# Patient Record
Sex: Female | Born: 1965 | Race: Black or African American | Hispanic: No | Marital: Married | Smoking: Never smoker
Health system: Southern US, Community
[De-identification: ages and names within clinical notes are randomized; demographics above are authoritative.]

## PROBLEM LIST (undated history)

## (undated) DIAGNOSIS — N62 Hypertrophy of breast: Secondary | ICD-10-CM

## (undated) DIAGNOSIS — I420 Dilated cardiomyopathy: Secondary | ICD-10-CM

## (undated) DIAGNOSIS — R002 Palpitations: Secondary | ICD-10-CM

## (undated) DIAGNOSIS — R05 Cough: Principal | ICD-10-CM

## (undated) DIAGNOSIS — E611 Iron deficiency: Secondary | ICD-10-CM

## (undated) DIAGNOSIS — E049 Nontoxic goiter, unspecified: Secondary | ICD-10-CM

## (undated) DIAGNOSIS — I10 Essential (primary) hypertension: Secondary | ICD-10-CM

## (undated) DIAGNOSIS — D3500 Benign neoplasm of unspecified adrenal gland: Secondary | ICD-10-CM

## (undated) DIAGNOSIS — I2699 Other pulmonary embolism without acute cor pulmonale: Secondary | ICD-10-CM

## (undated) HISTORY — DX: Dilated cardiomyopathy: I42.0

## (undated) HISTORY — DX: Hypertrophy of breast: N62

## (undated) HISTORY — DX: Morbid (severe) obesity due to excess calories: E66.01

## (undated) HISTORY — DX: Essential (primary) hypertension: I10

## (undated) HISTORY — PX: LAPAROTOMY: SHX154

## (undated) HISTORY — DX: Benign neoplasm of unspecified adrenal gland: D35.00

## (undated) HISTORY — DX: Cough: R05

## (undated) HISTORY — DX: Palpitations: R00.2

## (undated) HISTORY — DX: Iron deficiency: E61.1

## (undated) HISTORY — DX: Nontoxic goiter, unspecified: E04.9

## (undated) HISTORY — DX: Other pulmonary embolism without acute cor pulmonale: I26.99

---

## 2006-05-30 ENCOUNTER — Emergency Department: Payer: Self-pay | Admitting: Emergency Medicine

## 2006-10-31 ENCOUNTER — Emergency Department: Payer: Self-pay | Admitting: Emergency Medicine

## 2012-05-06 ENCOUNTER — Emergency Department: Payer: Self-pay | Admitting: Emergency Medicine

## 2013-03-06 DIAGNOSIS — Z8639 Personal history of other endocrine, nutritional and metabolic disease: Secondary | ICD-10-CM | POA: Insufficient documentation

## 2013-05-22 DIAGNOSIS — E0520 Thyrotoxicosis with toxic multinodular goiter without thyrotoxic crisis or storm: Secondary | ICD-10-CM | POA: Insufficient documentation

## 2014-02-02 ENCOUNTER — Ambulatory Visit: Payer: Self-pay | Admitting: Family Medicine

## 2014-06-03 ENCOUNTER — Encounter: Payer: Self-pay | Admitting: *Deleted

## 2014-06-04 ENCOUNTER — Ambulatory Visit (INDEPENDENT_AMBULATORY_CARE_PROVIDER_SITE_OTHER): Payer: BLUE CROSS/BLUE SHIELD | Admitting: Cardiovascular Disease

## 2014-06-04 ENCOUNTER — Encounter (INDEPENDENT_AMBULATORY_CARE_PROVIDER_SITE_OTHER): Payer: Self-pay

## 2014-06-04 ENCOUNTER — Encounter: Payer: Self-pay | Admitting: Cardiovascular Disease

## 2014-06-04 VITALS — BP 157/94 | HR 67 | Ht 68.0 in | Wt 352.5 lb

## 2014-06-04 DIAGNOSIS — R0602 Shortness of breath: Secondary | ICD-10-CM | POA: Diagnosis not present

## 2014-06-04 DIAGNOSIS — R002 Palpitations: Secondary | ICD-10-CM | POA: Diagnosis not present

## 2014-06-04 DIAGNOSIS — I10 Essential (primary) hypertension: Secondary | ICD-10-CM

## 2014-06-04 NOTE — Assessment & Plan Note (Signed)
I suspect that her dyspnea is multifactorial due to physical deconditioning, obesity and underlying anemia. She reports improvement in symptoms since he was started on iron therapy. Nonetheless, she had previous cardiomyopathy in the setting of hyperthyroidism with most recent ejection fraction of 55% in 2015. I requested a repeat echocardiogram for evaluation. Continue treatment with carvedilol.

## 2014-06-04 NOTE — Assessment & Plan Note (Signed)
She did not take carvedilol this morning. If blood pressure remains elevated, consider adding an ACE inhibitor or an ARB.

## 2014-06-04 NOTE — Progress Notes (Signed)
Primary care physician: Dr. Ancil Boozer  HPI  This is a pleasant 49 year old African-American female who was referred for evaluation of dyspnea. She was diagnosed with hyperthyroidism in 2010 and received radioactive iodine treatment. She reports that she had elevated blood pressure and heart rate at that time with possibly reduced ejection fraction. She was placed on carvedilol for that reason. She did undergo an echocardiogram in March 2015 at [facility] which showed an ejection fraction of 55% to 60% with normal right ventricle, dilated left ventricle and left atrium. The patient's other medical history include morbid obesity and anemia. She has no history of diabetes or hyperlipidemia. She is not a smoker. There is no family history of premature coronary artery disease. She has worsening exertional dyspnea recently without any chest pain. No orthopnea, PND or lower extremity edema. She was found to be severely anemic and was started on iron treatment. She reports improvement in symptoms since that time.  Allergies  Allergen Reactions  . Aspirin      Current Outpatient Prescriptions on File Prior to Visit  Medication Sig Dispense Refill  . carvedilol (COREG) 6.25 MG tablet Take 6.25 mg by mouth 2 (two) times daily with a meal.    . ferrous sulfate 325 (65 FE) MG tablet Take 325 mg by mouth 2 (two) times daily with a meal.      No current facility-administered medications on file prior to visit.     Past Medical History  Diagnosis Date  . Postpartum hemorrhage   . Iron deficiency   . Hypertension   . Morbid obesity   . Large breasts   . Dilated cardiomyopathy   . H/O: hypothyroidism   . Thyroid disease   . Heart palpitations      History reviewed. No pertinent past surgical history.   Family History  Problem Relation Age of Onset  . Family history unknown: Yes     History   Social History  . Marital Status: Single    Spouse Name: N/A  . Number of Children: N/A  . Years of  Education: N/A   Occupational History  . Not on file.   Social History Main Topics  . Smoking status: Never Smoker   . Smokeless tobacco: Not on file  . Alcohol Use: Yes     Comment: occasional  . Drug Use: No  . Sexual Activity: Not on file   Other Topics Concern  . Not on file   Social History Narrative     ROS A 10 point review of system was performed. It is negative other than that mentioned in the history of present illness.   PHYSICAL EXAM   BP 157/94 mmHg  Pulse 67  Ht 5\' 8"  (1.727 m)  Wt 352 lb 8 oz (159.893 kg)  BMI 53.61 kg/m2 Constitutional: She is oriented to person, place, and time. She appears well-developed and well-nourished. No distress.  HENT: No nasal discharge.  Head: Normocephalic and atraumatic.  Eyes: Pupils are equal and round. No discharge.  Neck: Normal range of motion. Neck supple. No JVD present. No thyromegaly present.  Cardiovascular: Normal rate, regular rhythm, normal heart sounds. Exam reveals no gallop and no friction rub. No murmur heard.  Pulmonary/Chest: Effort normal and breath sounds normal. No stridor. No respiratory distress. She has no wheezes. She has no rales. She exhibits no tenderness.  Abdominal: Soft. Bowel sounds are normal. She exhibits no distension. There is no tenderness. There is no rebound and no guarding.  Musculoskeletal: Normal range  of motion. She exhibits no edema and no tenderness.  Neurological: She is alert and oriented to person, place, and time. Coordination normal.  Skin: Skin is warm and dry. No rash noted. She is not diaphoretic. No erythema. No pallor.  Psychiatric: She has a normal mood and affect. Her behavior is normal. Judgment and thought content normal.     EKG: Sinus  Rhythm  Low voltage in precordial leads.   -Poor R-wave progression -may be secondary to pulmonary disease   consider old anterior infarct.   -  Nonspecific T-abnormality.   ABNORMAL    ASSESSMENT AND PLAN

## 2014-06-04 NOTE — Patient Instructions (Signed)
Medication Instructions:  Your physician recommends that you continue on your current medications as directed. Please refer to the Current Medication list given to you today.   Labwork: none  Testing/Procedures: Your physician has requested that you have an echocardiogram. Echocardiography is a painless test that uses sound waves to create images of your heart. It provides your doctor with information about the size and shape of your heart and how well your heart's chambers and valves are working. This procedure takes approximately one hour. There are no restrictions for this procedure.    Follow-Up: Your physician recommends that you schedule a follow-up appointment with Dr. Fletcher Anon as needed.   Any Other Special Instructions Will Be Listed Below (If Applicable).

## 2014-06-18 ENCOUNTER — Ambulatory Visit (INDEPENDENT_AMBULATORY_CARE_PROVIDER_SITE_OTHER): Payer: BLUE CROSS/BLUE SHIELD

## 2014-06-18 ENCOUNTER — Other Ambulatory Visit: Payer: Self-pay

## 2014-06-18 DIAGNOSIS — R0602 Shortness of breath: Secondary | ICD-10-CM

## 2014-06-22 ENCOUNTER — Encounter: Payer: Self-pay | Admitting: Family Medicine

## 2014-06-22 DIAGNOSIS — N62 Hypertrophy of breast: Secondary | ICD-10-CM | POA: Insufficient documentation

## 2014-06-22 DIAGNOSIS — I10 Essential (primary) hypertension: Secondary | ICD-10-CM | POA: Insufficient documentation

## 2014-06-22 DIAGNOSIS — Z8639 Personal history of other endocrine, nutritional and metabolic disease: Secondary | ICD-10-CM | POA: Insufficient documentation

## 2014-06-22 DIAGNOSIS — I517 Cardiomegaly: Secondary | ICD-10-CM | POA: Insufficient documentation

## 2014-06-22 DIAGNOSIS — D509 Iron deficiency anemia, unspecified: Secondary | ICD-10-CM | POA: Insufficient documentation

## 2014-06-24 ENCOUNTER — Ambulatory Visit: Payer: Self-pay | Admitting: Family Medicine

## 2014-09-02 ENCOUNTER — Encounter: Payer: Self-pay | Admitting: Family Medicine

## 2014-09-02 ENCOUNTER — Ambulatory Visit (INDEPENDENT_AMBULATORY_CARE_PROVIDER_SITE_OTHER): Payer: 59 | Admitting: Family Medicine

## 2014-09-02 VITALS — BP 132/84 | HR 79 | Temp 99.0°F | Resp 18 | Ht 68.0 in | Wt 348.8 lb

## 2014-09-02 DIAGNOSIS — N920 Excessive and frequent menstruation with regular cycle: Secondary | ICD-10-CM | POA: Diagnosis not present

## 2014-09-02 DIAGNOSIS — D509 Iron deficiency anemia, unspecified: Secondary | ICD-10-CM

## 2014-09-02 DIAGNOSIS — Z23 Encounter for immunization: Secondary | ICD-10-CM | POA: Diagnosis not present

## 2014-09-02 DIAGNOSIS — I10 Essential (primary) hypertension: Secondary | ICD-10-CM

## 2014-09-02 DIAGNOSIS — K219 Gastro-esophageal reflux disease without esophagitis: Secondary | ICD-10-CM

## 2014-09-02 DIAGNOSIS — Z8639 Personal history of other endocrine, nutritional and metabolic disease: Secondary | ICD-10-CM | POA: Diagnosis not present

## 2014-09-02 DIAGNOSIS — E668 Other obesity: Secondary | ICD-10-CM

## 2014-09-02 MED ORDER — FERROUS SULFATE 325 (65 FE) MG PO TABS: 325.0000 mg | ORAL_TABLET | Freq: Two times a day (BID) | ORAL | Status: DC

## 2014-09-02 MED ORDER — CARVEDILOL 6.25 MG PO TABS: 6.2500 mg | ORAL_TABLET | Freq: Two times a day (BID) | ORAL | Status: DC

## 2014-09-02 NOTE — Progress Notes (Signed)
Name: Madeline Dominguez   MRN: [id number]    DOB: [birth year]   Date:09/02/2014       Progress Note  Subjective  Chief Complaint  Chief Complaint  Patient presents with  . Medication Refill    follow-up  . Hypertension    HPI  HTN: taking Carvedilol, denies side effects and bp is at goal . No chest pain  SOB: seen by Dr. Fletcher Anon, Echo showed hypertrophic Left Ventricular wall with normal cavity. He thought it was multifactorial. She states she is feeling better with iron supplementation. Able to walk a half a mile without getting SOB.  Iron Deficiency anemia: she is taking ferrous sulfate but did not have any screening for colon cancer. She has a history of heavy cycles that was worse before thyroid ablation done earlier this year.   Obesity Morbid: she has lost some weight, has decreased soda intake to a maximum of 2 cans daily, and is walking more since SOB is not as severe.   GERD: she states 3 months ago she moved and started to eat out a lot more often, she describes as an indigestion feeling on her throat and regurgitation taking otc medication to control symptoms.    Patient Active Problem List   Diagnosis Date Noted  . Extreme obesity 06/22/2014  . Large breasts 06/22/2014  . Anemia, iron deficiency 06/22/2014  . Benign hypertension 06/22/2014  . H/O hyperthyroidism 06/22/2014  . LVH (left ventricular hypertrophy) 06/22/2014  . SOB (shortness of breath) 06/04/2014  . 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    History reviewed. No pertinent past surgical history.  Family History  Problem Relation Age of Onset  . Family history unknown: Yes    Social History   Social History  . Marital Status: Single    Spouse Name: N/A  . Number of Children: N/A  . Years of Education: N/A   Occupational History  . Not on file.   Social History Main Topics  . Smoking status: Never Smoker   . Smokeless tobacco: Never Used  . Alcohol Use: No     Comment: occasional   . Drug Use: No  . Sexual Activity: Not Currently   Other Topics Concern  . Not on file   Social History Narrative     Current outpatient prescriptions:  .  carvedilol (COREG) 6.25 MG tablet, Take 1 tablet (6.25 mg total) by mouth 2 (two) times daily with a meal., Disp: 180 tablet, Rfl: 1 .  ferrous sulfate 325 (65 FE) MG tablet, Take 1 tablet (325 mg total) by mouth 2 (two) times daily with a meal., Disp: 180 tablet, Rfl: 1  Allergies  Allergen Reactions  . Aspirin      ROS  Constitutional: Negative for fever but has some  weight change.  Respiratory: Negative for cough positive for mild  shortness of breath.   Cardiovascular: Negative for chest pain or palpitations.  Gastrointestinal: Negative for abdominal pain, no bowel changes.  Musculoskeletal: Negative for gait problem or joint swelling.  Skin: Negative for rash.  Neurological: Negative for dizziness or headache.  No other specific complaints in a complete review of systems (except as listed in HPI above). Objective  Filed Vitals:   09/02/14 1040  BP: 132/84  Pulse: 79  Temp: 99 F (37.2 C)  TempSrc: Oral  Resp: 18  Height: 5\' 8"  (1.727 m)  Weight: 348 lb 12.8 oz (158.215 kg)  SpO2: 97%    Body mass index is 53.05 kg/(m^2).  Physical Exam  Constitutional: Patient appears well-developed and obese  No distress.  HEENT: head atraumatic, normocephalic, pupils equal and reactive to light, neck supple, throat within normal limits Cardiovascular: Normal rate, regular rhythm and normal heart sounds.  No murmur heard. Trace BLE edema.  Pulmonary/Chest: Effort normal and breath sounds normal. No respiratory distress. Abdominal: Soft.  There is no tenderness. Psychiatric: Patient has a normal mood and affect. behavior is normal. Judgment and thought content normal.   PHQ2/9: Depression screen PHQ 2/9 09/02/2014  Decreased Interest 0  Down, Depressed, Hopeless 0  PHQ - 2 Score 0     Fall Risk: Fall Risk   09/02/2014  Falls in the past year? No      Assessment & Plan   1. Benign hypertension At goal  - carvedilol (COREG) 6.25 MG tablet; Take 1 tablet (6.25 mg total) by mouth 2 (two) times daily with a meal.  Dispense: 180 tablet; Refill: 1  2. Needs flu shot  - Flu Vaccine QUAD 36+ mos PF IM (Fluarix & Fluzone Quad PF) - refused - will get it at work   3. Extreme obesity Discussed with the patient the risk posed by an increased BMI. Discussed importance of portion control, calorie counting and at least 150 minutes of physical activity weekly. Avoid sweet beverages and drink more water. Eat at least 6 servings of fruit and vegetables daily   4. Anemia, iron deficiency She has heavy cycles and lasts 6 days, but because of her age we will send her home with hemoccult cards and if positive refer to GI, discussed contraception to decrease cycle but she will think about it - CBC with Differential/Platelet - Ferritin - ferrous sulfate 325 (65 FE) MG tablet; Take 1 tablet (325 mg total) by mouth 2 (two) times daily with a meal.  Dispense: 180 tablet; Refill: 1 - Hemoccult Cards (X3 cards); Standing - Hemoccult Cards (X3 cards)  5. H/O hyperthyroidism  - TSH  6. Need for Tdap vaccination  - Tdap vaccine greater than or equal to 7yo IM  7. Menorrhagia with regular cycle Discussed IUD, Nuvaring and ocp's she will think about it  8. Gastroesophageal reflux disease without esophagitis Symptoms started when she moved and started to eat out about 3 months ago, we will try life style modification first

## 2014-09-02 NOTE — Patient Instructions (Signed)

## 2014-09-28 ENCOUNTER — Telehealth: Payer: Self-pay | Admitting: Family Medicine

## 2014-09-28 ENCOUNTER — Other Ambulatory Visit: Payer: Self-pay

## 2014-09-28 DIAGNOSIS — I10 Essential (primary) hypertension: Secondary | ICD-10-CM

## 2014-09-28 MED ORDER — CARVEDILOL 6.25 MG PO TABS: 6.2500 mg | ORAL_TABLET | Freq: Two times a day (BID) | ORAL | Status: DC

## 2014-09-28 NOTE — Telephone Encounter (Signed)
pty said that dr told her to call when she needed a  NEW RX for her blood pressure meds/ Beta Blocker meds. Pharm is [facility] on [address]

## 2014-10-25 ENCOUNTER — Telehealth: Payer: Self-pay | Admitting: Family Medicine

## 2014-10-25 NOTE — Telephone Encounter (Signed)
Called pharmacy and they were filling her Carvedilol at the present moment, so I called the patient and informed her that the pharmacy would have her prescription ready shortly.

## 2014-10-25 NOTE — Telephone Encounter (Signed)
Pt still waiting on Beta Blocker from September to be sent to [facility].

## 2014-12-03 ENCOUNTER — Ambulatory Visit (INDEPENDENT_AMBULATORY_CARE_PROVIDER_SITE_OTHER): Payer: 59 | Admitting: Family Medicine

## 2014-12-03 ENCOUNTER — Encounter: Payer: Self-pay | Admitting: Family Medicine

## 2014-12-03 VITALS — BP 136/84 | HR 85 | Temp 99.1°F | Resp 16 | Ht 68.0 in | Wt 345.8 lb

## 2014-12-03 DIAGNOSIS — Z13220 Encounter for screening for lipoid disorders: Secondary | ICD-10-CM

## 2014-12-03 DIAGNOSIS — Z79899 Other long term (current) drug therapy: Secondary | ICD-10-CM

## 2014-12-03 DIAGNOSIS — R0683 Snoring: Secondary | ICD-10-CM | POA: Diagnosis not present

## 2014-12-03 DIAGNOSIS — N920 Excessive and frequent menstruation with regular cycle: Secondary | ICD-10-CM | POA: Diagnosis not present

## 2014-12-03 DIAGNOSIS — R42 Dizziness and giddiness: Secondary | ICD-10-CM | POA: Diagnosis not present

## 2014-12-03 DIAGNOSIS — R1310 Dysphagia, unspecified: Secondary | ICD-10-CM | POA: Diagnosis not present

## 2014-12-03 DIAGNOSIS — D509 Iron deficiency anemia, unspecified: Secondary | ICD-10-CM

## 2014-12-03 DIAGNOSIS — I10 Essential (primary) hypertension: Secondary | ICD-10-CM | POA: Diagnosis not present

## 2014-12-03 DIAGNOSIS — Z8639 Personal history of other endocrine, nutritional and metabolic disease: Secondary | ICD-10-CM | POA: Diagnosis not present

## 2014-12-03 DIAGNOSIS — E049 Nontoxic goiter, unspecified: Secondary | ICD-10-CM

## 2014-12-03 MED ORDER — NORGESTIMATE-ETH ESTRADIOL 0.25-35 MG-MCG PO TABS: 1.0000 | ORAL_TABLET | Freq: Every day | ORAL | Status: DC

## 2014-12-03 NOTE — Progress Notes (Signed)
Name: Madeline Dominguez   MRN: [id number]    DOB: June 27, 1965   Date:12/03/2014       Progress Note  Subjective  Chief Complaint  Chief Complaint  Patient presents with  . Medication Refill    FOLLOW-UP  . Hypertension    one episode of dizziness  . Dysphagia    onset 1 month food gets stuck in back of throat when eating    HPI  HTN: she is taking Carvedilol, no palpitation or chest pain. BP has been at goal.   Dizziness: she states she had one episode of dizziness earlier this week. She was at work and was asked to work another 4 hours. She went into her car to get a carvedilol pill to take, and it was an old pill. She took it, got back in the building and when she looked right the room seemed to spin to the left. It only lasted a brief second, no loss of consciousness, no chest pain, no SOB, no diaphoresis, no tinnitus or hearing loss associated. Symptoms only happened once.   Dysphagia: symptoms have been sporadic for months, however for the past month symptoms of food getting stuck on upper esophagus has been frequent- usually after dinner. It happens right after she eats or after taking her medication. It lasts at least 30 minutes seems to improve when she drinks water or massaging her throat. No blood in stools, no change in bowel movement  Iron deficiency anemia: she did not go get labs done, denies fatigue, cycles not as heavy now  Menorrhagia with regular cycle: she used to take ocp. Her husband has been [place] but will back in two months and she would like to go back on ocp's for pregnancy prevention and also for heavy cycles. She has taken ortho-cyclen in the past. No previous history of clots and she is not smoker.   Snoring: and wakes up with a mild headache in the morning, she is obese and has LVH , advised sleep study  Patient Active Problem List   Diagnosis Date Noted  . Heavy menstrual period 09/02/2014  . Gastroesophageal reflux disease without esophagitis 09/02/2014   . Extreme obesity ([place]) 06/22/2014  . Large breasts 06/22/2014  . Anemia, iron deficiency 06/22/2014  . Benign hypertension 06/22/2014  . H/O hyperthyroidism 06/22/2014  . LVH (left ventricular hypertrophy) 06/22/2014  . SOB (shortness of breath) 06/04/2014  . 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    History reviewed. No pertinent past surgical history.  Family History  Problem Relation Age of Onset  . Family history unknown: Yes    Social History   Social History  . Marital Status: Single    Spouse Name: N/A  . Number of Children: N/A  . Years of Education: N/A   Occupational History  . Not on file.   Social History Main Topics  . Smoking status: Never Smoker   . Smokeless tobacco: Never Used  . Alcohol Use: No     Comment: occasional  . Drug Use: No  . Sexual Activity: Not Currently   Other Topics Concern  . Not on file   Social History Narrative     Current outpatient prescriptions:  .  carvedilol (COREG) 6.25 MG tablet, Take 1 tablet (6.25 mg total) by mouth 2 (two) times daily with a meal., Disp: 180 tablet, Rfl: 0 .  ferrous sulfate 325 (65 FE) MG tablet, Take 1 tablet (325 mg total) by mouth 2 (two) times daily with a meal.,  Disp: 180 tablet, Rfl: 1 .  norgestimate-ethinyl estradiol (ORTHO-CYCLEN, 28,) 0.25-35 MG-MCG tablet, Take 1 tablet by mouth daily., Disp: 1 Package, Rfl: 11  Allergies  Allergen Reactions  . Aspirin      ROS  Constitutional: Negative for fever , positive for mild  weight change.  Respiratory: Negative for cough and shortness of breath.   Cardiovascular: Negative for chest pain or palpitations.  Gastrointestinal: Negative for abdominal pain, no bowel changes.  Musculoskeletal: Negative for gait problem or joint swelling.  Skin: Negative for rash.  Neurological: Negative for dizziness ( had one episodes ) , she has mild headache when she wakes up - discussed sleep study, but she wants to hold off for now.  No other  specific complaints in a complete review of systems (except as listed in HPI above).   Objective  Filed Vitals:   12/03/14 1110  BP: 136/84  Pulse: 85  Temp: 99.1 F (37.3 C)  TempSrc: Oral  Resp: 16  Height: 5\' 8"  (1.727 m)  Weight: 345 lb 12.8 oz (156.854 kg)  SpO2: 93%    Body mass index is 52.59 kg/(m^2).  Physical Exam  Constitutional: Patient appears well-developed and well-nourished. Obese  No distress.  HEENT: head atraumatic, normocephalic, pupils equal and reactive to light, neck supple, throat within normal limits, thyroid enlarged Cardiovascular: Normal rate, regular rhythm and normal heart sounds.  No murmur heard. No BLE edema. Pulmonary/Chest: Effort normal and breath sounds normal. No respiratory distress. Abdominal: Soft.  There is no tenderness. Psychiatric: Patient has a normal mood and affect. behavior is normal. Judgment and thought content normal.   PHQ2/9: Depression screen [facility] 2/9 12/03/2014 09/02/2014  Decreased Interest 0 0  Down, Depressed, Hopeless 0 0  PHQ - 2 Score 0 0    Fall Risk: Fall Risk  12/03/2014 09/02/2014  Falls in the past year? No No    Functional Status Survey: Is the patient deaf or have difficulty hearing?: No Does the patient have difficulty seeing, even when wearing glasses/contacts?: No Does the patient have difficulty concentrating, remembering, or making decisions?: No Does the patient have difficulty walking or climbing stairs?: No Does the patient have difficulty dressing or bathing?: No Does the patient have difficulty doing errands alone such as visiting a doctor's office or shopping?: No    Assessment & Plan  1. Dizziness  Call back if recurrence of symptoms, only one brief episode. Advised to stay hydrated also  2. Benign hypertension  At goal - Comprehensive metabolic panel  3. Iron deficiency anemia  Check labs, did not get it done on her last visit - POC Hemoccult Bld/Stl (3-Cd Home Screen);  Future  4. Lipid screening  - Lipid panel  5. Long-term use of high-risk medication  - Comprehensive metabolic panel  6. Menorrhagia with regular cycle  - norgestimate-ethinyl estradiol (ORTHO-CYCLEN, 28,) 0.25-35 MG-MCG tablet; Take 1 tablet by mouth daily.  Dispense: 1 Package; Refill: 11   7. Dysphagia  Discussed referral to GI or UGI and she chose the later - DG UGI  W/KUB; Future  8. Snoring  - Nocturnal polysomnography (NPSG); Future ESS of 5 today, but she is high risk with morbid obesity, LVH, HTN  9. Goiter  - US Soft Tissue Head/Neck; Future  10. History of toxic multinodular goiter  S/p ablation but seems large during exam today

## 2014-12-04 LAB — CBC WITH DIFFERENTIAL/PLATELET
BASOS ABS: 0 10*3/uL (ref 0.0–0.2)
BASOS: 1 %
EOS (ABSOLUTE): 0.2 10*3/uL (ref 0.0–0.4)
Eos: 3 %
Hematocrit: 33.6 % — ABNORMAL LOW (ref 34.0–46.6)
Hemoglobin: 10.1 g/dL — ABNORMAL LOW (ref 11.1–15.9)
IMMATURE GRANULOCYTES: 0 %
Immature Grans (Abs): 0 10*3/uL (ref 0.0–0.1)
Lymphocytes Absolute: 2.5 10*3/uL (ref 0.7–3.1)
Lymphs: 38 %
MCH: 26.9 pg (ref 26.6–33.0)
MCHC: 30.1 g/dL — ABNORMAL LOW (ref 31.5–35.7)
MCV: 89 fL (ref 79–97)
MONOS ABS: 0.5 10*3/uL (ref 0.1–0.9)
Monocytes: 7 %
NEUTROS PCT: 51 %
Neutrophils Absolute: 3.4 10*3/uL (ref 1.4–7.0)
PLATELETS: 303 10*3/uL (ref 150–379)
RBC: 3.76 x10E6/uL — ABNORMAL LOW (ref 3.77–5.28)
RDW: 15.8 % — AB (ref 12.3–15.4)
WBC: 6.6 10*3/uL (ref 3.4–10.8)

## 2014-12-04 LAB — LIPID PANEL
CHOLESTEROL TOTAL: 190 mg/dL (ref 100–199)
Chol/HDL Ratio: 2.6 ratio units (ref 0.0–4.4)
HDL: 74 mg/dL (ref >39)
LDL CALC: 105 mg/dL — AB (ref 0–99)
Triglycerides: 56 mg/dL (ref 0–149)
VLDL Cholesterol Cal: 11 mg/dL (ref 5–40)

## 2014-12-04 LAB — COMPREHENSIVE METABOLIC PANEL
ALT: 6 IU/L (ref 0–32)
AST: 8 IU/L (ref 0–40)
Albumin/Globulin Ratio: 1.1 (ref 1.1–2.5)
Albumin: 3.6 g/dL (ref 3.5–5.5)
Alkaline Phosphatase: 77 IU/L (ref 39–117)
BUN/Creatinine Ratio: 13 (ref 9–23)
BUN: 9 mg/dL (ref 6–24)
Bilirubin Total: 0.4 mg/dL (ref 0.0–1.2)
CALCIUM: 9 mg/dL (ref 8.7–10.2)
CO2: 26 mmol/L (ref 18–29)
CREATININE: 0.69 mg/dL (ref 0.57–1.00)
Chloride: 105 mmol/L (ref 97–106)
GFR calc Af Amer: 120 mL/min/{1.73_m2} (ref >59)
GFR, EST NON AFRICAN AMERICAN: 104 mL/min/{1.73_m2} (ref >59)
GLOBULIN, TOTAL: 3.3 g/dL (ref 1.5–4.5)
GLUCOSE: 95 mg/dL (ref 65–99)
Potassium: 4.6 mmol/L (ref 3.5–5.2)
SODIUM: 144 mmol/L (ref 136–144)
Total Protein: 6.9 g/dL (ref 6.0–8.5)

## 2014-12-04 LAB — TSH: TSH: 3.74 u[IU]/mL (ref 0.450–4.500)

## 2014-12-04 LAB — FERRITIN: Ferritin: 19 ng/mL (ref 15–150)

## 2014-12-06 ENCOUNTER — Encounter: Payer: Self-pay | Admitting: Family Medicine

## 2014-12-06 DIAGNOSIS — D509 Iron deficiency anemia, unspecified: Secondary | ICD-10-CM

## 2014-12-16 ENCOUNTER — Ambulatory Visit
Admission: RE | Admit: 2014-12-16 | Discharge: 2014-12-16 | Disposition: A | Discharge status: Home / Self Care | Payer: 59 | Source: Ambulatory Visit | Attending: Family Medicine | Admitting: Family Medicine

## 2014-12-16 DIAGNOSIS — E049 Nontoxic goiter, unspecified: Secondary | ICD-10-CM

## 2014-12-17 ENCOUNTER — Other Ambulatory Visit: Payer: Self-pay | Admitting: Family Medicine

## 2014-12-17 DIAGNOSIS — E041 Nontoxic single thyroid nodule: Secondary | ICD-10-CM | POA: Insufficient documentation

## 2015-01-05 ENCOUNTER — Telehealth: Payer: Self-pay | Admitting: Family Medicine

## 2015-01-05 NOTE — Telephone Encounter (Signed)
Patient was prescribed a new birthcontrol that she began last week. She now has diarrhea and her right ear is numb with tingle (almost like the hand when it falls asleep.) would like to know if she should stop taking the mediation. She did read where she could develop diarrhea in the beginning and that it will go away but she is concerned about her ear being numb (which is not all the time). She take the pill at night and when she wakes up that is when she notice the numbness the next morning. Please advise.

## 2015-01-06 ENCOUNTER — Telehealth: Payer: Self-pay

## 2015-01-06 NOTE — Telephone Encounter (Signed)
I don't think it is related, she can come in to be seen if symptoms persists.

## 2015-01-06 NOTE — Telephone Encounter (Signed)
Madeline Dominguez: Patient was prescribed a new birthcontrol that she began last week. She now has diarrhea and her right ear is numb with tingle (almost like the hand when it falls asleep.) would like to know if she should stop taking the mediation. She did read where she could develop diarrhea in the beginning and that it will go away but she is concerned about her ear being numb (which is not all the time). She take the pill at night and when she wakes up that is when she notice the numbness the next morning. Please advise.  Dr. Ancil Boozer: I don't think it is related, she can come in to be seen if symptoms persists.  I contacted this patient to relay Dr. Ancil Boozer message and she said ok, thanks.

## 2015-02-09 ENCOUNTER — Telehealth: Payer: Self-pay | Admitting: Family Medicine

## 2015-02-09 NOTE — Telephone Encounter (Signed)
Patient was prescribed a birthcontrol pill and had to stop taking it due to complications. It caused swollen and hard breast with fever and patient was vomiting every morning. Would like to know if you could change the brand.

## 2015-02-10 NOTE — Telephone Encounter (Signed)
Patient notified

## 2015-02-10 NOTE — Telephone Encounter (Signed)
The breast tenderness can happen with ocp's, but no fever. Ask her to try with food during the day and see if she can tolerate it

## 2015-02-14 ENCOUNTER — Telehealth: Payer: Self-pay

## 2015-02-14 MED ORDER — NORGESTIM-ETH ESTRAD TRIPHASIC 0.18/0.215/0.25 MG-25 MCG PO TABS: 1.0000 | ORAL_TABLET | Freq: Every day | ORAL | Status: DC

## 2015-02-14 NOTE — Telephone Encounter (Signed)
Patient states she was taking birth control at night with food but every morning she was waking up vomiting, and heat from bilateral breast. Patient would like to go back on ortho cyclen low due to side effects from this birth control.

## 2015-03-04 ENCOUNTER — Encounter: Payer: Self-pay | Admitting: Family Medicine

## 2015-03-04 ENCOUNTER — Ambulatory Visit (INDEPENDENT_AMBULATORY_CARE_PROVIDER_SITE_OTHER): Payer: 59 | Admitting: Family Medicine

## 2015-03-04 VITALS — BP 132/84 | HR 84 | Temp 98.2°F | Resp 18 | Wt 346.2 lb

## 2015-03-04 DIAGNOSIS — I517 Cardiomegaly: Secondary | ICD-10-CM | POA: Diagnosis not present

## 2015-03-04 DIAGNOSIS — I10 Essential (primary) hypertension: Secondary | ICD-10-CM | POA: Diagnosis not present

## 2015-03-04 DIAGNOSIS — N920 Excessive and frequent menstruation with regular cycle: Secondary | ICD-10-CM

## 2015-03-04 DIAGNOSIS — R0683 Snoring: Secondary | ICD-10-CM | POA: Diagnosis not present

## 2015-03-04 DIAGNOSIS — Z8639 Personal history of other endocrine, nutritional and metabolic disease: Secondary | ICD-10-CM | POA: Diagnosis not present

## 2015-03-04 DIAGNOSIS — D509 Iron deficiency anemia, unspecified: Secondary | ICD-10-CM | POA: Diagnosis not present

## 2015-03-04 MED ORDER — CARVEDILOL 6.25 MG PO TABS: 6.2500 mg | ORAL_TABLET | Freq: Two times a day (BID) | ORAL | Status: DC

## 2015-03-04 NOTE — Progress Notes (Signed)
Name: Madeline Dominguez   MRN: [id number]    DOB: Oct 28, 1965   Date:03/04/2015       Progress Note  Subjective  Chief Complaint  Chief Complaint  Patient presents with  . Dizziness  . Hypertension    patient is here for her 41-month f/u. patient check BP daily: runs around 130/83. no negative sx.  . Medication Refill  . Medication Management    birth control meds made her sick & breast were hot.    HPI   HTN: secondary to hyperthyroidism , she also has LVH but not CHF. Taking Coreg daily and bp is at goal. No side effects. No chest pain or palpitation  Menorrhagia with regular cycle: .Her husband has been [place] but will back in 2 weeks. She was given Ortho Cyclen back in December, but the medication caused breast tenderness and engorgement, fatigue, nausea. She asked to be switched back to low dose, however she did know that it was called in to her pharmacy. She has been without ocp since last cycle. LMP 02/18/2015 Advised to start ocp the first day of her next cycle, but use condoms for the first three months to avoid pregnancy  Snoring: wakes up feeling tired, sleep study was ordered but patient did not get an appointment. We will send another referral  GERD: doing much better now, very seldom needs to otc for heartburn, no regurgitation  Anemia: iron deficiency, she states she brought the hemoccult cards, but results not in her chart, we sill send another set of stool cards home with her.   Obesity: she has not gained or lost weight. She states that once her husband arrives she will start walking again.   Patient Active Problem List   Diagnosis Date Noted  . Thyroid nodule 12/17/2014  . Heavy menstrual period 09/02/2014  . Gastroesophageal reflux disease without esophagitis 09/02/2014  . Extreme obesity ([place]) 06/22/2014  . Large breasts 06/22/2014  . Anemia, iron deficiency 06/22/2014  . Benign hypertension 06/22/2014  . H/O hyperthyroidism 06/22/2014  . LVH (left  ventricular hypertrophy) 06/22/2014  . SOB (shortness of breath) 06/04/2014  . 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    History reviewed. No pertinent past surgical history.  Family History  Problem Relation Age of Onset  . Family history unknown: Yes    Social History   Social History  . Marital Status: Single    Spouse Name: N/A  . Number of Children: N/A  . Years of Education: N/A   Occupational History  . Not on file.   Social History Main Topics  . Smoking status: Never Smoker   . Smokeless tobacco: Never Used  . Alcohol Use: No     Comment: occasional  . Drug Use: No  . Sexual Activity: Not Currently   Other Topics Concern  . Not on file   Social History Narrative     Current outpatient prescriptions:  .  carvedilol (COREG) 6.25 MG tablet, Take 1 tablet (6.25 mg total) by mouth 2 (two) times daily with a meal., Disp: 180 tablet, Rfl: 0 .  ferrous sulfate 325 (65 FE) MG tablet, Take 1 tablet (325 mg total) by mouth 2 (two) times daily with a meal., Disp: 180 tablet, Rfl: 1 .  Norgestimate-Ethinyl Estradiol Triphasic (ORTHO TRI-CYCLEN LO) 0.18/0.215/0.25 MG-25 MCG tab, Take 1 tablet by mouth daily., Disp: 1 Package, Rfl: 11  Allergies  Allergen Reactions  . Aspirin      ROS  Constitutional: Negative for fever or  weight change.  Respiratory:Positive for cough ( she states from a tree near her work - associated with sneezing ) no shortness of breath.   Cardiovascular: Negative for chest pain or palpitations.  Gastrointestinal: Negative for abdominal pain, no bowel changes.  Musculoskeletal: Negative for gait problem or joint swelling.  Skin: Negative for rash.  Neurological: Negative for dizziness or headache.  No other specific complaints in a complete review of systems (except as listed in HPI above).  Objective  Filed Vitals:   03/04/15 0917  BP: 132/84  Pulse: 84  Temp: 98.2 F (36.8 C)  TempSrc: Oral  Resp: 18  Weight: 346 lb 3.2  oz (157.035 kg)  SpO2: 98%    Body mass index is 52.65 kg/(m^2).  Physical Exam  Constitutional: Patient appears well-developed and well-nourished. Obese  No distress.  HEENT: head atraumatic, normocephalic, pupils equal and reactive to light, neck supple, thyromegaly noted,  throat within normal limits Cardiovascular: Normal rate, regular rhythm with some extra beats and normal heart sounds.  No murmur heard. No BLE edema. Pulmonary/Chest: Effort normal and breath sounds normal. No respiratory distress. Abdominal: Soft.  There is no tenderness. Psychiatric: Patient has a normal mood and affect. behavior is normal. Judgment and thought content normal   PHQ2/9: Depression screen [facility] 2/9 03/04/2015 12/03/2014 09/02/2014  Decreased Interest 0 0 0  Down, Depressed, Hopeless 0 0 0  PHQ - 2 Score 0 0 0     Fall Risk: Fall Risk  03/04/2015 12/03/2014 09/02/2014  Falls in the past year? No No No    Functional Status Survey: Is the patient deaf or have difficulty hearing?: No Does the patient have difficulty seeing, even when wearing glasses/contacts?: No Does the patient have difficulty concentrating, remembering, or making decisions?: No Does the patient have difficulty walking or climbing stairs?: No Does the patient have difficulty dressing or bathing?: No Does the patient have difficulty doing errands alone such as visiting a doctor's office or shopping?: No    Assessment & Plan  1. Benign hypertension  Well controlled, continue medication   2. Snoring  - Ambulatory referral to Sleep Studies  3. History of toxic multinodular goiter  Continue follow up with Endo  4. Menorrhagia with regular cycle  Resume ocp, Ortho Tricyclen Lo, recheck CBC next visit, needs to repeat hemoccult card  5. LVH (left ventricular hypertrophy)  Stable - reviewed echo on [facility] chart  6. Morbid obesity, unspecified obesity type [facility])  Discussed with the patient the risk posed by an increased  BMI. Discussed importance of portion control, calorie counting and at least 150 minutes of physical activity weekly. Avoid sweet beverages and drink more water. Eat at least 6 servings of fruit and vegetables daily    7. Anemia, iron deficiency  Check hemoccult stool cards

## 2015-04-08 ENCOUNTER — Ambulatory Visit: Payer: BLUE CROSS/BLUE SHIELD

## 2015-05-06 ENCOUNTER — Telehealth: Payer: Self-pay | Admitting: Family Medicine

## 2015-05-06 NOTE — Telephone Encounter (Signed)
Please resend carvedilol. Patient called the pharmacy and they did not have anything of file.

## 2015-05-12 ENCOUNTER — Ambulatory Visit: Payer: 59 | Attending: Otolaryngology

## 2015-05-12 DIAGNOSIS — R0683 Snoring: Secondary | ICD-10-CM | POA: Insufficient documentation

## 2015-05-12 DIAGNOSIS — G4733 Obstructive sleep apnea (adult) (pediatric): Secondary | ICD-10-CM | POA: Diagnosis present

## 2015-05-12 DIAGNOSIS — I10 Essential (primary) hypertension: Secondary | ICD-10-CM | POA: Diagnosis not present

## 2015-05-12 DIAGNOSIS — Z6841 Body Mass Index (BMI) 40.0 and over, adult: Secondary | ICD-10-CM | POA: Insufficient documentation

## 2015-05-18 ENCOUNTER — Encounter: Payer: Self-pay | Admitting: Family Medicine

## 2015-05-18 DIAGNOSIS — G4733 Obstructive sleep apnea (adult) (pediatric): Secondary | ICD-10-CM | POA: Insufficient documentation

## 2015-05-19 ENCOUNTER — Telehealth: Payer: Self-pay

## 2015-05-19 NOTE — Telephone Encounter (Signed)
Called patient to inform her that we did receive her results from the sleep study, but that we were uncertain if she has received her supplies. She was not in so a message was left to give [place] a call when she got the chance.

## 2015-06-07 ENCOUNTER — Ambulatory Visit: Payer: 59 | Admitting: Family Medicine

## 2015-06-17 ENCOUNTER — Emergency Department
Admission: EM | Admit: 2015-06-17 | Discharge: 2015-06-17 | Disposition: A | Discharge status: Home / Self Care | Payer: 59 | Attending: Emergency Medicine | Admitting: Emergency Medicine

## 2015-06-17 ENCOUNTER — Emergency Department: Payer: 59

## 2015-06-17 ENCOUNTER — Encounter: Payer: Self-pay | Admitting: Emergency Medicine

## 2015-06-17 DIAGNOSIS — I119 Hypertensive heart disease without heart failure: Secondary | ICD-10-CM | POA: Insufficient documentation

## 2015-06-17 DIAGNOSIS — E039 Hypothyroidism, unspecified: Secondary | ICD-10-CM | POA: Insufficient documentation

## 2015-06-17 DIAGNOSIS — Z79899 Other long term (current) drug therapy: Secondary | ICD-10-CM | POA: Diagnosis not present

## 2015-06-17 DIAGNOSIS — I420 Dilated cardiomyopathy: Secondary | ICD-10-CM | POA: Insufficient documentation

## 2015-06-17 DIAGNOSIS — R0602 Shortness of breath: Secondary | ICD-10-CM | POA: Diagnosis present

## 2015-06-17 LAB — CBC WITH DIFFERENTIAL/PLATELET
BASOS ABS: 0 10*3/uL (ref 0–0.1)
BASOS PCT: 1 %
EOS PCT: 3 %
Eosinophils Absolute: 0.2 10*3/uL (ref 0–0.7)
HEMATOCRIT: 26.5 % — AB (ref 35.0–47.0)
Hemoglobin: 8.1 g/dL — ABNORMAL LOW (ref 12.0–16.0)
Lymphocytes Relative: 21 %
Lymphs Abs: 1.6 10*3/uL (ref 1.0–3.6)
MCH: 22.7 pg — ABNORMAL LOW (ref 26.0–34.0)
MCHC: 30.4 g/dL — AB (ref 32.0–36.0)
MCV: 74.5 fL — ABNORMAL LOW (ref 80.0–100.0)
MONO ABS: 0.5 10*3/uL (ref 0.2–0.9)
Monocytes Relative: 7 %
NEUTROS ABS: 5.3 10*3/uL (ref 1.4–6.5)
Neutrophils Relative %: 68 %
PLATELETS: 245 10*3/uL (ref 150–440)
RBC: 3.55 MIL/uL — ABNORMAL LOW (ref 3.80–5.20)
RDW: 18.3 % — AB (ref 11.5–14.5)
WBC: 7.7 10*3/uL (ref 3.6–11.0)

## 2015-06-17 LAB — BASIC METABOLIC PANEL
Anion gap: 8 (ref 5–15)
BUN: 10 mg/dL (ref 6–20)
CHLORIDE: 105 mmol/L (ref 101–111)
CO2: 26 mmol/L (ref 22–32)
Calcium: 8.3 mg/dL — ABNORMAL LOW (ref 8.9–10.3)
Creatinine, Ser: 0.67 mg/dL (ref 0.44–1.00)
GFR calc non Af Amer: >60 mL/min (ref >60)
Glucose, Bld: 109 mg/dL — ABNORMAL HIGH (ref 65–99)
POTASSIUM: 3.6 mmol/L (ref 3.5–5.1)
SODIUM: 139 mmol/L (ref 135–145)

## 2015-06-17 LAB — TROPONIN I

## 2015-06-17 LAB — BRAIN NATRIURETIC PEPTIDE: B Natriuretic Peptide: 110 pg/mL — ABNORMAL HIGH (ref 0.0–100.0)

## 2015-06-17 MED ORDER — IPRATROPIUM-ALBUTEROL 0.5-2.5 (3) MG/3ML IN SOLN
3.0000 mL | Freq: Once | RESPIRATORY_TRACT | Status: AC
  Administered 2015-06-17: 3 mL via RESPIRATORY_TRACT
  Filled 2015-06-17: qty 3

## 2015-06-17 MED ORDER — ALBUTEROL SULFATE HFA 108 (90 BASE) MCG/ACT IN AERS: 2.0000 | INHALATION_SPRAY | Freq: Four times a day (QID) | RESPIRATORY_TRACT | Status: DC | PRN

## 2015-06-17 NOTE — Discharge Instructions (Signed)
Please seek medical attention for any high fevers, chest pain, shortness of breath, change in behavior, persistent vomiting, bloody stool or any other new or concerning symptoms. ° ° °Shortness of Breath °Shortness of breath means you have trouble breathing. Shortness of breath needs medical care right away. °HOME CARE  °· Do not smoke. °· Avoid being around chemicals or things (paint fumes, dust) that may bother your breathing. °· Rest as needed. Slowly begin your normal activities. °· Only take medicines as told by your doctor. °· Keep all doctor visits as told. °GET HELP RIGHT AWAY IF:  °· Your shortness of breath gets worse. °· You feel lightheaded, pass out (faint), or have a cough that is not helped by medicine. °· You cough up blood. °· You have pain with breathing. °· You have pain in your chest, arms, shoulders, or belly (abdomen). °· You have a fever. °· You cannot walk up stairs or exercise the way you normally do. °· You do not get better in the time expected. °· You have a hard time doing normal activities even with rest. °· You have problems with your medicines. °· You have any new symptoms. °MAKE SURE YOU: °· Understand these instructions. °· Will watch your condition. °· Will get help right away if you are not doing well or get worse. °  °This information is not intended to replace advice given to you by your health care provider. Make sure you discuss any questions you have with your health care provider. °  °Document Released: 06/06/2007 Document Revised: 12/23/2012 Document Reviewed: 03/05/2011 °Elsevier Interactive Patient Education ©2016 [employer] ° °

## 2015-06-17 NOTE — ED Notes (Signed)
Pt states she has been short of breath for the past several days. Increased with ambulation.  Pt denies any hx of COPD or asthma.  Pt c/o shortness of breath when lying on her back.

## 2015-06-17 NOTE — ED Provider Notes (Signed)
[facility] Emergency Department Provider Note    ____________________________________________  Time seen: ~1145  I have reviewed the triage vital signs and the nursing notes.   HISTORY  Chief Complaint Shortness of Breath   History limited by: Not Limited   HPI Madeline Dominguez is a 50 y.o. female presents to the emergency department today because of concerns for shortness of breath. It started yesterday. It has been constant since then. It is worse with exertion and lying flat on her back. She has had associated cough. This has been productive of clear phlegm. Denies any associated fevers. No chest pain. Denies similar symptoms in the past. States that her lower legs swell from time to time but she has not noticed any new swelling.Additionally the patient says that she has been told she has severe anemia. She has been prescribed iron pills but states that she does not take them regularly or as they are prescribed. She denies any bloody stool or black or tarry stool. She still has her periods and states they are happy at the beginning.    Past Medical History  Diagnosis Date  . Postpartum hemorrhage   . Iron deficiency   . Hypertension   . Morbid obesity ([place])   . Large breasts   . Dilated cardiomyopathy ([place])   . H/O: hypothyroidism   . Thyroid disease   . Heart palpitations     Patient Active Problem List   Diagnosis Date Noted  . OSA (obstructive sleep apnea) 05/18/2015  . Thyroid nodule 12/17/2014  . Heavy menstrual period 09/02/2014  . Gastroesophageal reflux disease without esophagitis 09/02/2014  . Extreme obesity ([place]) 06/22/2014  . Large breasts 06/22/2014  . Anemia, iron deficiency 06/22/2014  . Benign hypertension 06/22/2014  . H/O hyperthyroidism 06/22/2014  . LVH (left ventricular hypertrophy) 06/22/2014  . SOB (shortness of breath) 06/04/2014  . 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    History reviewed. No  pertinent past surgical history.  Current Outpatient Rx  Name  Route  Sig  Dispense  Refill  . carvedilol (COREG) 6.25 MG tablet   Oral   Take 1 tablet (6.25 mg total) by mouth 2 (two) times daily with a meal.   180 tablet   0   . ferrous sulfate 325 (65 FE) MG tablet   Oral   Take 1 tablet (325 mg total) by mouth 2 (two) times daily with a meal.   180 tablet   1   . Norgestimate-Ethinyl Estradiol Triphasic (ORTHO TRI-CYCLEN LO) 0.18/0.215/0.25 MG-25 MCG tab   Oral   Take 1 tablet by mouth daily.   1 Package   11     Allergies Aspirin  Family History  Problem Relation Age of Onset  . Family history unknown: Yes    Social History Social History  Substance Use Topics  . Smoking status: Never Smoker   . Smokeless tobacco: Never Used  . Alcohol Use: No     Comment: occasional    Review of Systems  Constitutional: Negative for fever. Cardiovascular: Negative for chest pain. Respiratory: Positive for shortness of breath. Gastrointestinal: Negative for abdominal pain, vomiting and diarrhea. Neurological: Negative for headaches, focal weakness or numbness.   10-point ROS otherwise negative.  ____________________________________________   PHYSICAL EXAM:  VITAL SIGNS: ED Triage Vitals  Enc Vitals Group     BP 06/17/15 1050 157/93 mmHg     Pulse Rate 06/17/15 1050 74     Resp 06/17/15 1050 18  Temp 06/17/15 1050 98.1 F (36.7 C)     Temp Source 06/17/15 1050 Oral     SpO2 06/17/15 1050 99 %     Weight 06/17/15 1050 330 lb (149.687 kg)     Height 06/17/15 1050 5\' 7"  (1.702 m)     Head Cir --      Peak Flow --      Pain Score 06/17/15 1050 0   Constitutional: Alert and oriented. Well appearing and in no distress.Obese. Eyes: Conjunctivae are normal. PERRL. Normal extraocular movements. ENT   Head: Normocephalic and atraumatic.   Nose: No congestion/rhinnorhea.   Mouth/Throat: Mucous membranes are moist.   Neck: No  stridor. Hematological/Lymphatic/Immunilogical: No cervical lymphadenopathy. Cardiovascular: Normal rate, regular rhythm.  No murmurs, rubs, or gallops. Respiratory: Normal respiratory effort without tachypnea nor retractions. Somewhat diffuse expiratory wheeze. No crackles or rhonchi appreciated. Gastrointestinal: Soft and nontender. No distention. There is no CVA tenderness. Genitourinary: Deferred Musculoskeletal: Normal range of motion in all extremities. No joint effusions.  1+ pitting edema bilaterally Neurologic:  Normal speech and language. No gross focal neurologic deficits are appreciated.  Skin:  Skin is warm, dry and intact. No rash noted. Psychiatric: Mood and affect are normal. Speech and behavior are normal. Patient exhibits appropriate insight and judgment.  ____________________________________________    LABS (pertinent positives/negatives)  Labs Reviewed  CBC WITH DIFFERENTIAL/PLATELET - Abnormal; Notable for the following:    RBC 3.55 (*)    Hemoglobin 8.1 (*)    HCT 26.5 (*)    MCV 74.5 (*)    MCH 22.7 (*)    MCHC 30.4 (*)    RDW 18.3 (*)    All other components within normal limits  BRAIN NATRIURETIC PEPTIDE - Abnormal; Notable for the following:    B Natriuretic Peptide 110.0 (*)    All other components within normal limits  BASIC METABOLIC PANEL - Abnormal; Notable for the following:    Glucose, Bld 109 (*)    Calcium 8.3 (*)    All other components within normal limits  TROPONIN I     ____________________________________________   EKG  I, Nance Pear, attending physician, personally viewed and interpreted this EKG  EKG Time: 1102 Rate: 72 Rhythm: normal sinus rhythm with PVC Axis: normal Intervals: qtc 474 QRS: narrow ST changes: no st elevation Impression: abnormal ekg   ____________________________________________    RADIOLOGY  CXR IMPRESSION: Cardiomegaly. No edema or  consolidation.   ____________________________________________   PROCEDURES  Procedure(s) performed: None  Critical Care performed: No  ____________________________________________   INITIAL IMPRESSION / ASSESSMENT AND PLAN / ED COURSE  Pertinent labs & imaging results that were available during my care of the patient were reviewed by me and considered in my medical decision making (see chart for details).  Presents to the emergency department today because of concerns for shortness of breath that started yesterday. On exam patient does have some mild wheezing diffusely. This point unclear if it is cardiac in origin or more reactive airway. Blood work does show minimally elevated BNP. Additionally just show anemia. Patient has a history of severe anemia however does not know what her baseline hemoglobin has been recently. States he last saw her primary care doctor for this 3 months ago.   Patient's breathing did improve after DuoNeb treatment. No further wheezing. Will discharge with albuterol inhaler prescription. ____________________________________________   FINAL CLINICAL IMPRESSION(S) / ED DIAGNOSES  Final diagnoses:  Shortness of breath     Note: This dictation was prepared with  Sales executive. Any transcriptional errors that result from this process are unintentional    Nance Pear, MD 06/17/15 [id number]

## 2015-06-17 NOTE — ED Notes (Signed)
Pt does c/o cough with shob.  Pt able to speak in short sentences without running out of breath.

## 2015-06-22 ENCOUNTER — Telehealth: Payer: Self-pay | Admitting: Family Medicine

## 2015-06-22 NOTE — Telephone Encounter (Signed)
Tried to schedule appointment for last Friday but we where booked. Patient went to ER and was given Albuterol inhaler. 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 She tried calling ER to get note for work but they told her that they where not able to do this that she would have to contact her PCP. We are completely booked for the remainder of the week, please advise.

## 2015-06-22 NOTE — Telephone Encounter (Signed)
Please scheduled patient

## 2015-06-22 NOTE — Telephone Encounter (Signed)
What about tomorrow at 68 ?

## 2015-06-23 NOTE — Telephone Encounter (Signed)
Had spoken with patient on 06-22-15 and she said she would give [place] a call back. She would speak with her supervisor.

## 2015-07-02 DIAGNOSIS — I2699 Other pulmonary embolism without acute cor pulmonale: Secondary | ICD-10-CM

## 2015-07-02 HISTORY — DX: Other pulmonary embolism without acute cor pulmonale: I26.99

## 2015-07-25 ENCOUNTER — Other Ambulatory Visit: Payer: Self-pay

## 2015-07-25 ENCOUNTER — Inpatient Hospital Stay
Admission: EM | Admit: 2015-07-25 | Discharge: 2015-07-27 | DRG: 176 | Disposition: A | Discharge status: Home / Self Care | Payer: Commercial Managed Care - HMO | Attending: Internal Medicine | Admitting: Internal Medicine

## 2015-07-25 ENCOUNTER — Ambulatory Visit
Admission: RE | Admit: 2015-07-25 | Discharge: 2015-07-25 | Disposition: A | Discharge status: Home / Self Care | Payer: Commercial Managed Care - HMO | Source: Ambulatory Visit | Attending: Family Medicine | Admitting: Family Medicine

## 2015-07-25 ENCOUNTER — Encounter: Payer: Self-pay | Admitting: Family Medicine

## 2015-07-25 ENCOUNTER — Ambulatory Visit (INDEPENDENT_AMBULATORY_CARE_PROVIDER_SITE_OTHER): Payer: 59 | Admitting: Family Medicine

## 2015-07-25 ENCOUNTER — Other Ambulatory Visit
Admission: RE | Admit: 2015-07-25 | Discharge: 2015-07-25 | Disposition: A | Discharge status: Home / Self Care | Payer: 59 | Source: Ambulatory Visit | Attending: Family Medicine | Admitting: Family Medicine

## 2015-07-25 ENCOUNTER — Encounter: Payer: Self-pay | Admitting: Emergency Medicine

## 2015-07-25 VITALS — BP 122/80 | HR 92 | Temp 98.4°F | Resp 18 | Ht 67.0 in | Wt 348.5 lb

## 2015-07-25 DIAGNOSIS — D509 Iron deficiency anemia, unspecified: Secondary | ICD-10-CM | POA: Diagnosis present

## 2015-07-25 DIAGNOSIS — Z79899 Other long term (current) drug therapy: Secondary | ICD-10-CM

## 2015-07-25 DIAGNOSIS — I82401 Acute embolism and thrombosis of unspecified deep veins of right lower extremity: Secondary | ICD-10-CM | POA: Diagnosis present

## 2015-07-25 DIAGNOSIS — E279 Disorder of adrenal gland, unspecified: Secondary | ICD-10-CM

## 2015-07-25 DIAGNOSIS — I10 Essential (primary) hypertension: Secondary | ICD-10-CM | POA: Diagnosis present

## 2015-07-25 DIAGNOSIS — Z6841 Body Mass Index (BMI) 40.0 and over, adult: Secondary | ICD-10-CM

## 2015-07-25 DIAGNOSIS — F5089 Other specified eating disorder: Secondary | ICD-10-CM | POA: Diagnosis present

## 2015-07-25 DIAGNOSIS — E278 Other specified disorders of adrenal gland: Secondary | ICD-10-CM | POA: Diagnosis present

## 2015-07-25 DIAGNOSIS — M7989 Other specified soft tissue disorders: Secondary | ICD-10-CM

## 2015-07-25 DIAGNOSIS — R911 Solitary pulmonary nodule: Secondary | ICD-10-CM | POA: Diagnosis present

## 2015-07-25 DIAGNOSIS — M79602 Pain in left arm: Secondary | ICD-10-CM

## 2015-07-25 DIAGNOSIS — R600 Localized edema: Secondary | ICD-10-CM

## 2015-07-25 DIAGNOSIS — I420 Dilated cardiomyopathy: Secondary | ICD-10-CM | POA: Diagnosis present

## 2015-07-25 DIAGNOSIS — I2699 Other pulmonary embolism without acute cor pulmonale: Principal | ICD-10-CM | POA: Diagnosis present

## 2015-07-25 DIAGNOSIS — R509 Fever, unspecified: Secondary | ICD-10-CM | POA: Diagnosis not present

## 2015-07-25 DIAGNOSIS — R05 Cough: Secondary | ICD-10-CM

## 2015-07-25 DIAGNOSIS — E042 Nontoxic multinodular goiter: Secondary | ICD-10-CM | POA: Diagnosis present

## 2015-07-25 DIAGNOSIS — E538 Deficiency of other specified B group vitamins: Secondary | ICD-10-CM | POA: Diagnosis present

## 2015-07-25 DIAGNOSIS — R918 Other nonspecific abnormal finding of lung field: Secondary | ICD-10-CM | POA: Insufficient documentation

## 2015-07-25 DIAGNOSIS — E039 Hypothyroidism, unspecified: Secondary | ICD-10-CM | POA: Diagnosis present

## 2015-07-25 DIAGNOSIS — R609 Edema, unspecified: Secondary | ICD-10-CM

## 2015-07-25 DIAGNOSIS — R059 Cough, unspecified: Secondary | ICD-10-CM

## 2015-07-25 DIAGNOSIS — R0602 Shortness of breath: Secondary | ICD-10-CM | POA: Diagnosis present

## 2015-07-25 DIAGNOSIS — Z86711 Personal history of pulmonary embolism: Secondary | ICD-10-CM | POA: Diagnosis present

## 2015-07-25 HISTORY — DX: Cough, unspecified: R05.9

## 2015-07-25 LAB — CBC WITH DIFFERENTIAL/PLATELET
BASOS ABS: 0.1 10*3/uL (ref 0–0.1)
BASOS PCT: 1 %
BASOS PCT: 1 %
Basophils Absolute: 0.1 10*3/uL (ref 0–0.1)
EOS ABS: 0.1 10*3/uL (ref 0–0.7)
EOS ABS: 0.2 10*3/uL (ref 0–0.7)
EOS PCT: 1 %
EOS PCT: 2 %
HCT: 26.2 % — ABNORMAL LOW (ref 35.0–47.0)
HCT: 27.1 % — ABNORMAL LOW (ref 35.0–47.0)
HEMOGLOBIN: 7.7 g/dL — AB (ref 12.0–16.0)
Hemoglobin: 8.1 g/dL — ABNORMAL LOW (ref 12.0–16.0)
Lymphocytes Relative: 24 %
Lymphocytes Relative: 24 %
Lymphs Abs: 1.8 10*3/uL (ref 1.0–3.6)
Lymphs Abs: 2.2 10*3/uL (ref 1.0–3.6)
MCH: 20.7 pg — AB (ref 26.0–34.0)
MCH: 21 pg — ABNORMAL LOW (ref 26.0–34.0)
MCHC: 29.6 g/dL — AB (ref 32.0–36.0)
MCHC: 29.8 g/dL — ABNORMAL LOW (ref 32.0–36.0)
MCV: 69.9 fL — ABNORMAL LOW (ref 80.0–100.0)
MCV: 70.5 fL — ABNORMAL LOW (ref 80.0–100.0)
MONO ABS: 0.5 10*3/uL (ref 0.2–0.9)
MONO ABS: 0.8 10*3/uL (ref 0.2–0.9)
MONOS PCT: 6 %
Monocytes Relative: 8 %
NEUTROS PCT: 68 %
Neutro Abs: 5.1 10*3/uL (ref 1.4–6.5)
Neutro Abs: 5.8 10*3/uL (ref 1.4–6.5)
Neutrophils Relative %: 65 %
PLATELETS: 305 10*3/uL (ref 150–440)
PLATELETS: 339 10*3/uL (ref 150–440)
RBC: 3.75 MIL/uL — ABNORMAL LOW (ref 3.80–5.20)
RBC: 3.84 MIL/uL (ref 3.80–5.20)
RDW: 19.4 % — AB (ref 11.5–14.5)
RDW: 19.2 % — AB (ref 11.5–14.5)
WBC: 7.6 10*3/uL (ref 3.6–11.0)
WBC: 9 10*3/uL (ref 3.6–11.0)

## 2015-07-25 LAB — HEPARIN LEVEL (UNFRACTIONATED): Heparin Unfractionated: <0.1 IU/mL — ABNORMAL LOW (ref 0.30–0.70)

## 2015-07-25 LAB — BASIC METABOLIC PANEL
ANION GAP: 6 (ref 5–15)
BUN: 9 mg/dL (ref 6–20)
CALCIUM: 8.2 mg/dL — AB (ref 8.9–10.3)
CO2: 29 mmol/L (ref 22–32)
Chloride: 105 mmol/L (ref 101–111)
Creatinine, Ser: 0.8 mg/dL (ref 0.44–1.00)
GFR calc Af Amer: >60 mL/min (ref >60)
GLUCOSE: 98 mg/dL (ref 65–99)
POTASSIUM: 3.7 mmol/L (ref 3.5–5.1)
SODIUM: 140 mmol/L (ref 135–145)

## 2015-07-25 LAB — TSH: TSH: 2.898 u[IU]/mL (ref 0.350–4.500)

## 2015-07-25 LAB — PROTIME-INR
INR: 1.01
PROTHROMBIN TIME: 13.5 s (ref 11.4–15.0)

## 2015-07-25 LAB — FIBRIN DERIVATIVES D-DIMER (ARMC ONLY): FIBRIN DERIVATIVES D-DIMER (ARMC): 2185 — AB (ref 0–499)

## 2015-07-25 LAB — APTT: APTT: 24 s (ref 24–36)

## 2015-07-25 MED ORDER — ACETAMINOPHEN 325 MG PO TABS
650.0000 mg | ORAL_TABLET | Freq: Four times a day (QID) | ORAL | Status: DC | PRN
  Administered 2015-07-25 – 2015-07-27 (×5): 650 mg via ORAL
  Filled 2015-07-25 (×5): qty 2

## 2015-07-25 MED ORDER — FERROUS SULFATE 325 (65 FE) MG PO TABS
325.0000 mg | ORAL_TABLET | Freq: Two times a day (BID) | ORAL | Status: DC
  Administered 2015-07-26 – 2015-07-27 (×3): 325 mg via ORAL
  Filled 2015-07-25 (×3): qty 1

## 2015-07-25 MED ORDER — RIVAROXABAN 15 MG PO TABS
15.0000 mg | ORAL_TABLET | Freq: Once | ORAL | Status: AC
  Administered 2015-07-25: 15 mg via ORAL
  Filled 2015-07-25: qty 1

## 2015-07-25 MED ORDER — ALBUTEROL SULFATE HFA 108 (90 BASE) MCG/ACT IN AERS: 2.0000 | INHALATION_SPRAY | Freq: Four times a day (QID) | RESPIRATORY_TRACT | Status: DC | PRN

## 2015-07-25 MED ORDER — ONDANSETRON HCL 4 MG/2ML IJ SOLN
4.0000 mg | Freq: Four times a day (QID) | INTRAMUSCULAR | Status: DC | PRN
  Administered 2015-07-26: 4 mg via INTRAVENOUS
  Filled 2015-07-25: qty 2

## 2015-07-25 MED ORDER — ALBUTEROL SULFATE (2.5 MG/3ML) 0.083% IN NEBU
2.5000 mg | INHALATION_SOLUTION | Freq: Four times a day (QID) | RESPIRATORY_TRACT | Status: DC | PRN
Start: 2015-07-25 — End: 2015-07-27

## 2015-07-25 MED ORDER — RIVAROXABAN (XARELTO) VTE STARTER PACK (15 & 20 MG): ORAL_TABLET | ORAL | 0 refills | Status: DC

## 2015-07-25 MED ORDER — IOPAMIDOL (ISOVUE-370) INJECTION 76%
100.0000 mL | Freq: Once | INTRAVENOUS | Status: AC | PRN
  Administered 2015-07-25: 100 mL via INTRAVENOUS

## 2015-07-25 MED ORDER — HEPARIN BOLUS VIA INFUSION
3000.0000 [IU] | Freq: Once | INTRAVENOUS | Status: AC
  Administered 2015-07-26: 3000 [IU] via INTRAVENOUS
  Filled 2015-07-25: qty 3000

## 2015-07-25 MED ORDER — CARVEDILOL 6.25 MG PO TABS
6.2500 mg | ORAL_TABLET | Freq: Two times a day (BID) | ORAL | Status: DC
  Administered 2015-07-26 – 2015-07-27 (×2): 6.25 mg via ORAL
  Filled 2015-07-25 (×4): qty 1

## 2015-07-25 MED ORDER — ACETAMINOPHEN 650 MG RE SUPP: 650.0000 mg | Freq: Four times a day (QID) | RECTAL | Status: DC | PRN

## 2015-07-25 MED ORDER — HEPARIN (PORCINE) IN NACL 100-0.45 UNIT/ML-% IJ SOLN
2000.0000 [IU]/h | INTRAMUSCULAR | Status: DC
  Administered 2015-07-26 (×2): 1700 [IU]/h via INTRAVENOUS
  Filled 2015-07-25 (×6): qty 250

## 2015-07-25 MED ORDER — SODIUM CHLORIDE 0.9% FLUSH
3.0000 mL | Freq: Two times a day (BID) | INTRAVENOUS | Status: DC
  Administered 2015-07-26 (×2): 3 mL via INTRAVENOUS

## 2015-07-25 MED ORDER — SODIUM CHLORIDE 0.9 % IV SOLN
INTRAVENOUS | Status: DC
  Administered 2015-07-25: 21:00:00 via INTRAVENOUS

## 2015-07-25 MED ORDER — ONDANSETRON HCL 4 MG PO TABS: 4.0000 mg | ORAL_TABLET | Freq: Four times a day (QID) | ORAL | Status: DC | PRN

## 2015-07-25 NOTE — H&P (Signed)
[facility] [place] NAME: Madeline Dominguez    MR#:  [id number]  DATE OF BIRTH:  [birth year]  DATE OF ADMISSION:  07/25/2015  PRIMARY CARE PHYSICIAN: Loistine Chance, MD   REQUESTING/REFERRING PHYSICIAN: Dr. Lisa Roca  CHIEF COMPLAINT:   Chief Complaint  Patient presents with  . Shortness of Breath    HISTORY OF PRESENT ILLNESS:  Madeline Dominguez  is a 50 y.o. female with a known history of Obesity, left ventricular-trophy, hypertension, menorrhagia and on hormonal treatment for this same, hypertension presents to the hospital secondary to right lower extremity swelling and shortness of breath. Patient has noticed that her his right leg was more swollen than the left one, she went to see her PCP today for the same. It has been going on for almost a week. She also noticed that she has nonproductive cough, low-grade temperatures at home also associated with some shortness of breath. Denies any chest pain. Has been having nausea from constant coughing but no vomiting so diarrhea or abdominal pain. Because she has several risk factors for clots-obesity, decreased mobility status and being on hormonal treatment, lower extremity Dopplers were ordered which showed that patient has occlusive DVT in the right lower extremity. It was followed by CT chest which showed bilateral pulmonary emboli. The patient is being admitted for treatment of DVT and PE. She became hypoxic on ambulation and tachycardic.  PAST MEDICAL HISTORY:   Past Medical History:  Diagnosis Date  . Cough 07/25/2015  . Dilated cardiomyopathy ([place])   . H/O: hypothyroidism   . Heart palpitations   . Hypertension   . Iron deficiency   . Large breasts   . Morbid obesity ([place])   . Postpartum hemorrhage   . Thyroid disease     PAST SURGICAL HISTORY:   Past Surgical History:  Procedure Laterality Date  . CESAREAN SECTION      SOCIAL HISTORY:   Social History  Substance  Use Topics  . Smoking status: Never Smoker  . Smokeless tobacco: Never Used  . Alcohol use No     Comment: occasional    FAMILY HISTORY:   Family History  Problem Relation Age of Onset  . CVA Father     DRUG ALLERGIES:   Allergies  Allergen Reactions  . Aspirin Hives    REVIEW OF SYSTEMS:   Review of Systems  Constitutional: Positive for fever and malaise/fatigue. Negative for chills and weight loss.  HENT: Negative for ear discharge, ear pain, nosebleeds and tinnitus.   Eyes: Negative for blurred vision, double vision and photophobia.  Respiratory: Positive for cough and shortness of breath. Negative for hemoptysis and wheezing.   Cardiovascular: Negative for chest pain, palpitations, orthopnea and leg swelling.  Gastrointestinal: Positive for nausea. Negative for abdominal pain, constipation, diarrhea, heartburn, melena and vomiting.  Genitourinary: Negative for dysuria, frequency and urgency.  Musculoskeletal: Negative for back pain, myalgias and neck pain.  Skin: Negative for rash.  Neurological: Negative for dizziness, tingling, tremors, sensory change, speech change, focal weakness and headaches.  Endo/Heme/Allergies: Does not bruise/bleed easily.  Psychiatric/Behavioral: Negative for depression.    MEDICATIONS AT HOME:   Prior to Admission medications   Medication Sig Start Date End Date Taking? Authorizing Provider  albuterol (PROVENTIL HFA;VENTOLIN HFA) 108 (90 Base) MCG/ACT inhaler Inhale 2 puffs into the lungs every 6 (six) hours as needed for wheezing or shortness of breath. 06/17/15  Yes Nance Pear, MD  carvedilol (COREG) 6.25 MG tablet  Take 1 tablet (6.25 mg total) by mouth 2 (two) times daily with a meal. 03/04/15  Yes Steele Sizer, MD  ferrous sulfate 325 (65 FE) MG tablet Take 1 tablet (325 mg total) by mouth 2 (two) times daily with a meal. 09/02/14  Yes Steele Sizer, MD  Rivaroxaban 15 & 20 MG TBPK Take as directed on package: Start with one 15mg   tablet by mouth twice a day with food. On Day 22, switch to one 20mg  tablet once a day with food. 07/25/15   Lisa Roca, MD      VITAL SIGNS:  Blood pressure (!) 153/86, pulse 80, temperature 98.5 F (36.9 C), temperature source Oral, resp. rate (!) 22, height 5\' 7"  (1.702 m), weight (!) 158.3 kg (349 lb), last menstrual period 07/25/2015, SpO2 96 %.  PHYSICAL EXAMINATION:   Physical Exam  GENERAL:  50 y.o.-year-old obese patient lying in the bed with no acute distress.  EYES: Pupils equal, round, reactive to light and accommodation. No scleral icterus. Extraocular muscles intact. Pale conjunctivae HEENT: Head atraumatic, normocephalic. Oropharynx and nasopharynx clear.  NECK:  Supple, no jugular venous distention. No thyroid enlargement, no tenderness.  LUNGS: Normal breath sounds bilaterally, no wheezing, rales,rhonchi or crepitation. No use of accessory muscles of respiration. Decreased bibasilar breath sounds CARDIOVASCULAR: S1, S2 normal. No murmurs, rubs, or gallops.  ABDOMEN: Soft, obese abdomen, nontender, nondistended. Bowel sounds present. No organomegaly or mass.  EXTREMITIES: right leg is more swollen, no erythema or tenderness noted. No cyanosis, or clubbing.  NEUROLOGIC: Cranial nerves II through XII are intact. Muscle strength 5/5 in all extremities. Sensation intact. Gait not checked.  PSYCHIATRIC: The patient is alert and oriented x 3.  SKIN: No obvious rash, lesion, or ulcer.   LABORATORY PANEL:   CBC  Recent Labs Lab 07/25/15 1734  WBC 9.0  HGB 8.1*  HCT 27.1*  PLT 339   ------------------------------------------------------------------------------------------------------------------  Chemistries   Recent Labs Lab 07/25/15 1734  NA 140  K 3.7  CL 105  CO2 29  GLUCOSE 98  BUN 9  CREATININE 0.80  CALCIUM 8.2*   ------------------------------------------------------------------------------------------------------------------  Cardiac Enzymes No  results for input(s): TROPONINI in the last 168 hours. ------------------------------------------------------------------------------------------------------------------  RADIOLOGY:  Ct Angio Chest Pe W Or Wo Contrast  Addendum Date: 07/25/2015   ADDENDUM REPORT: 07/25/2015 17:35 ADDENDUM: Critical Value/emergent results were called by me at the time of interpretation on 07/25/2015 at 5:33 pm to Dr. Rosanna Randy, covering for Dr. Steele Sizer , who verbally acknowledged these results. Given that this exam was completed after normal office hours, I personally discussed the results of the exam with the patient and her husband and and let them know that I had talked to Dr. Rosanna Randy. I instructed them to go immediately to the emergency department, given the clinical significance of pulmonary embolus. The patient voiced understanding of the situation and agree to proceed directly to the [facility] emergency room. I also called Marya Amsler, the charge nurse in the emergency room, to alert him of the patient's impending arrival. Electronically Signed   By: Misty Stanley M.D.   On: 07/25/2015 17:35  Result Date: 07/25/2015 CLINICAL DATA:  Cough and shortness of breath for 1 month. Positive right lower extremity DVT. EXAM: CT ANGIOGRAPHY CHEST WITH CONTRAST TECHNIQUE: Multidetector CT imaging of the chest was performed using the standard protocol during bolus administration of intravenous contrast. Multiplanar CT image reconstructions and MIPs were obtained to evaluate the vascular anatomy. CONTRAST:  100 cc Isovue 370 COMPARISON:  None.  FINDINGS: Cardiovascular: Heart is upper normal in a mildly enlarged. No thoracic aortic aneurysm. Pulmonary embolic disease is seen and lobar and segmental pulmonary arteries to the right lower lobe. There is evidence of pulmonary embolus and segmental left upper lobe pulmonary arteries. Mediastinum/Nodes: No mediastinal lymphadenopathy. 1.9 cm right thyroid nodule noted. There is no hilar  lymphadenopathy. There is no axillary lymphadenopathy. The esophagus has normal imaging features. Lungs/Pleura: 5 mm subpleural nodule identified anterior right lung. 3 mm right lung nodule seen on image 40 series 5. Other scattered tiny bilateral pulmonary nodules are evident, measuring 5 mm or less in diameter. There is some atelectasis at the bases. No pulmonary edema or pleural effusion. Upper Abdomen: 2.1 cm left adrenal nodule incompletely visualized. Musculoskeletal: Bone windows reveal no worrisome lytic or sclerotic osseous lesions. Review of the MIP images confirms the above findings. IMPRESSION: 1. Acute bilateral pulmonary embolus. No evidence for right heart strain. 2. Bilateral tiny pulmonary nodules. No follow-up needed if patient is low-risk (and has no known or suspected primary neoplasm). Non-contrast chest CT can be considered in 12 months if patient is high-risk. This recommendation follows the consensus statement: Guidelines for Management of Incidental Pulmonary Nodules Detected on CT Images:From the Fleischner Society 2017; published online before print (10.1148/radiol.SG:5268862). 3. 2.1 cm left adrenal nodule has been incompletely visualized. CT imaging of the abdomen before and after contrast could be performed for further evaluation. Electronically Signed: By: Misty Stanley M.D. On: 07/25/2015 17:15  US Venous Img Lower Unilateral Right  Result Date: 07/25/2015 CLINICAL DATA:  50 year old female with a history of right leg swelling EXAM: RIGHT LOWER EXTREMITY VENOUS DOPPLER ULTRASOUND TECHNIQUE: Gray-scale sonography with graded compression, as well as color Doppler and duplex ultrasound were performed to evaluate the lower extremity deep venous systems from the level of the common femoral vein and including the common femoral, femoral, profunda femoral, popliteal and calf veins including the posterior tibial, peroneal and gastrocnemius veins when visible. The superficial great  saphenous vein was also interrogated. Spectral Doppler was utilized to evaluate flow at rest and with distal augmentation maneuvers in the common femoral, femoral and popliteal veins. COMPARISON:  None. FINDINGS: Contralateral Common Femoral Vein: Respiratory phasicity is normal and symmetric with the symptomatic side. No evidence of thrombus. Normal compressibility. Common Femoral Vein: Nonocclusive thrombus extending from the femoral vein. Respiratory phasicity and compressibility maintained. Saphenofemoral Junction: Nonocclusive thrombus at the common femoral vein extending up from the femoral vein. Profunda Femoral Vein: Partially occlusive thrombus involving the profunda femoris vein at the confluence. Femoral Vein: Occlusive thrombus of the femoral vein extending as far proximally as into the common femoral vein, and distally through the popliteal vein. Popliteal Vein: Nonocclusive thrombus of the popliteal vein extending into the femoral vein without involvement of the tibial veins. Calf Veins: Peroneal vein not visualized. No occlusive thrombus of the posterior tibial vein. Superficial Great Saphenous Vein: No evidence of thrombus. Normal compressibility and flow on color Doppler imaging. Other Findings:  None. IMPRESSION: Sonographic survey of the right lower extremity is positive for occlusive DVT of the femoral vein extending distally to involve the popliteal vein, and proximally into the common femoral vein. DVT of the common femoral vein is not occlusive and does not appear to extend into the pelvis. The technologist performing the study has called the preliminary report to the ordering physician. Signed, Dulcy Fanny. Earleen Newport, DO [facility] Radiology Electronically Signed   By: Corrie Mckusick D.O.   On:  07/25/2015 16:18   EKG:   Orders placed or performed during the hospital encounter of 07/25/15  . ED EKG  . ED EKG    IMPRESSION AND PLAN:    Madeline Dominguez  is a 50 y.o. female with a known history of Obesity, left ventricular-trophy, hypertension, menorrhagia and on hormonal treatment for this same, hypertension presents to the hospital secondary to right lower extremity swelling and shortness of breath.  #1 bilateral PE with right lower extremity DVT-as explained above, the risk factors include obesity, hormonal treatment and immobile status -Hypercoagulable workup ordered anyways. -Started on IV heparin drip and possible discharge on oral anticoagulation once heart rate is more stable. -Echo ordered to rule out right heart strain. Monitor on off unit telemetry. No significant EKG changes. -Discontinue oral contraceptives  #2 chronic anemia-chronic iron deficiency anemia. Hemoglobin last year was 10, this year has been staying around 8. Outpatient stool occult cards ordered -Likely due to menorrhagia. -Continue taking iron pills. Advised the risk with anticoagulation now, recommended frequent follow-up and outpatient hematology follow-up.  #3 hypertension-continue Coreg  #4 history of multinodular goiter-check TSH, not on any thyroid supplements as outpatient.  #5 DVT prophylaxis- on heparin drip    All the records are reviewed and case discussed with ED provider. Management plans discussed with the patient, family and they are in agreement.  CODE STATUS: Full Code  TOTAL TIME TAKING CARE OF THIS PATIENT: 50 minutes.    Gladstone Lighter M.D on 07/25/2015 at 7:19 PM  Between 7am to 6pm - Pager - [phone]  After 6pm go to [email or web address] - password [email or web address] [facility]  Office  [phone]  CC: Primary care physician; Loistine Chance, MD

## 2015-07-25 NOTE — ED Triage Notes (Signed)
C/O 1 week history of SOB.  Had CT Chest done outpatient today and results showed a PE and Right DVT

## 2015-07-25 NOTE — Progress Notes (Signed)
Name: Madeline Dominguez   MRN: [id number]    DOB: [birth year]   Date:07/25/2015       Progress Note  Subjective  Chief Complaint  Chief Complaint  Patient presents with  . Emesis    last episode this morning  . Fever    last checked about 10:30 pm 99.3    HPI  Cough: she states she has noticed a morning productive - clear in color - for the past few months. She states this past weekend cough has been worse, but she developed a fever and also cough was so intense that has been making her gag and vomited. Three episodes over the past few days. She did not take any antipyretics since last night. She is afebrile now.   Right leg swelling and pain: she noticed symptoms last week, never had increase in redness or temperature, but she states the calf has been tight and swollen, slightly better now, but still feels swollen. She has risk factors for DVT and we will check doppler and d-dimer ( if elevated ) may need CT chest to rule out PE  Heavy cycles and anemia: stopped taking, she in on ocp's, also takes ferrous sulfate. Last hgb was low, she will need to have stool cards done ( states it was dropped off but results not available. We will refer her to hematologist if hgb is still dropping.    Patient Active Problem List   Diagnosis Date Noted  . OSA (obstructive sleep apnea) 05/18/2015  . Thyroid nodule 12/17/2014  . Heavy menstrual period 09/02/2014  . Gastroesophageal reflux disease without esophagitis 09/02/2014  . Extreme obesity ([place]) 06/22/2014  . Large breasts 06/22/2014  . Anemia, iron deficiency 06/22/2014  . Benign hypertension 06/22/2014  . H/O hyperthyroidism 06/22/2014  . LVH (left ventricular hypertrophy) 06/22/2014  . 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    History reviewed. No pertinent surgical history.  Family History  Problem Relation Age of Onset  . Family history unknown: Yes    Social History   Social History  . Marital status: Single    Spouse  name: N/A  . Number of children: N/A  . Years of education: N/A   Occupational History  . Not on file.   Social History Main Topics  . Smoking status: Never Smoker  . Smokeless tobacco: Never Used  . Alcohol use No     Comment: occasional  . Drug use: No  . Sexual activity: Not Currently   Other Topics Concern  . Not on file   Social History Narrative  . No narrative on file     Current Outpatient Prescriptions:  .  albuterol (PROVENTIL HFA;VENTOLIN HFA) 108 (90 Base) MCG/ACT inhaler, Inhale 2 puffs into the lungs every 6 (six) hours as needed for wheezing or shortness of breath., Disp: 1 Inhaler, Rfl: 0 .  carvedilol (COREG) 6.25 MG tablet, Take 1 tablet (6.25 mg total) by mouth 2 (two) times daily with a meal., Disp: 180 tablet, Rfl: 0 .  ferrous sulfate 325 (65 FE) MG tablet, Take 1 tablet (325 mg total) by mouth 2 (two) times daily with a meal., Disp: 180 tablet, Rfl: 1 .  Norgestimate-Ethinyl Estradiol Triphasic (ORTHO TRI-CYCLEN LO) 0.18/0.215/0.25 MG-25 MCG tab, Take 1 tablet by mouth daily., Disp: 1 Package, Rfl: 11  Allergies  Allergen Reactions  . Aspirin      ROS  Constitutional: Negative for fever , positive for  weight change.  Respiratory: Positive  for cough and shortness  of breath.   Cardiovascular: Negative for chest pain or palpitations.  Gastrointestinal: Negative for abdominal pain, no bowel changes.  Musculoskeletal: Negative for gait problem or joint swelling.  Skin: Negative for rash.  Neurological: Negative for dizziness or headache.  No other specific complaints in a complete review of systems (except as listed in HPI above).  Objective  Vitals:   07/25/15 1046  BP: 122/80  Pulse: 92  Resp: 18  Temp: 98.4 F (36.9 C)  SpO2: 95%  Weight: (!) 348 lb 8 oz (158.1 kg)  Height: 5' 7"  (1.702 m)    Body mass index is 54.58 kg/m.  Physical Exam  Constitutional: Patient appears well-developed and morbid obese No distress.  HEENT: head  atraumatic, normocephalic, pupils equal and reactive to light,  neck supple, throat within normal limits, oral mucosa is very pale Cardiovascular: Normal rate, regular rhythm and normal heart sounds.  No murmur heard. Right calf pain, mild swelling Pulmonary/Chest: Effort normal and breath sounds normal. No respiratory distress. Abdominal: Soft.  There is no tenderness. Psychiatric: Patient has a normal mood and affect. behavior is normal. Judgment and thought content normal.   Recent Results (from the past 2160 hour(s))  CBC with Differential     Status: Abnormal   Collection Time: 06/17/15 11:13 AM  Result Value Ref Range   WBC 7.7 3.6 - 11.0 K/uL   RBC 3.55 (L) 3.80 - 5.20 MIL/uL   Hemoglobin 8.1 (L) 12.0 - 16.0 g/dL   HCT 26.5 (L) 35.0 - 47.0 %   MCV 74.5 (L) 80.0 - 100.0 fL   MCH 22.7 (L) 26.0 - 34.0 pg   MCHC 30.4 (L) 32.0 - 36.0 g/dL   RDW 18.3 (H) 11.5 - 14.5 %   Platelets 245 150 - 440 K/uL   Neutrophils Relative % 68 %   Neutro Abs 5.3 1.4 - 6.5 K/uL   Lymphocytes Relative 21 %   Lymphs Abs 1.6 1.0 - 3.6 K/uL   Monocytes Relative 7 %   Monocytes Absolute 0.5 0.2 - 0.9 K/uL   Eosinophils Relative 3 %   Eosinophils Absolute 0.2 0 - 0.7 K/uL   Basophils Relative 1 %   Basophils Absolute 0.0 0 - 0.1 K/uL  Brain natriuretic peptide     Status: Abnormal   Collection Time: 06/17/15 11:13 AM  Result Value Ref Range   B Natriuretic Peptide 110.0 (H) 0.0 - 100.0 pg/mL  Basic metabolic panel     Status: Abnormal   Collection Time: 06/17/15 11:13 AM  Result Value Ref Range   Sodium 139 135 - 145 mmol/L   Potassium 3.6 3.5 - 5.1 mmol/L   Chloride 105 101 - 111 mmol/L   CO2 26 22 - 32 mmol/L   Glucose, Bld 109 (H) 65 - 99 mg/dL   BUN 10 6 - 20 mg/dL   Creatinine, Ser 0.67 0.44 - 1.00 mg/dL   Calcium 8.3 (L) 8.9 - 10.3 mg/dL   GFR calc non Af Amer >60 >60 mL/min   GFR calc Af Amer >60 >60 mL/min    Comment: (NOTE) The eGFR has been calculated using the CKD EPI  equation. This calculation has not been validated in all clinical situations. eGFR's persistently <60 mL/min signify possible Chronic Kidney Disease.    Anion gap 8 5 - 15  Troponin I     Status: None   Collection Time: 06/17/15 11:13 AM  Result Value Ref Range   Troponin I <0.03 <0.031 ng/mL    Comment:  NO INDICATION OF MYOCARDIAL INJURY.       PHQ2/9: Depression screen [facility] 2/9 03/04/2015 12/03/2014 09/02/2014  Decreased Interest 0 0 0  Down, Depressed, Hopeless 0 0 0  PHQ - 2 Score 0 0 0     Fall Risk: Fall Risk  03/04/2015 12/03/2014 09/02/2014  Falls in the past year? No No No     Assessment & Plan  1. Cough  - DG Chest 2 View; Future - D-Dimer, Quantitative  2. Fever, unspecified fever cause  - CBC with Differential/Platelet  3. Right leg swelling  - VAS [place] LOWER EXTREMITY VENOUS (DVT); Future - D-Dimer, Quantitative  4. Anemia, iron deficiency  - CBC with Differential/Platelet

## 2015-07-25 NOTE — Addendum Note (Signed)
Addended by: Inda Coke on: 07/25/2015 03:19 PM   Modules accepted: Orders

## 2015-07-25 NOTE — Progress Notes (Signed)
ANTICOAGULATION CONSULT NOTE - Initial Consult  Pharmacy Consult for heparin drip Indication: pulmonary embolus  Allergies  Allergen Reactions  . Aspirin Hives    Patient Measurements: Height: 5\' 7"  (170.2 cm) Weight: (!) 349 lb (158.3 kg) IBW/kg (Calculated) : 61.6 Heparin Dosing Weight: 101 kg  Vital Signs: Temp: 98.5 F (36.9 C) (07/24 1736) Temp Source: Oral (07/24 1736) BP: 153/86 (07/24 1900) Pulse Rate: 80 (07/24 1900)  Labs:  Recent Labs  07/25/15 1218 07/25/15 1734  HGB 7.7* 8.1*  HCT 26.2* 27.1*  PLT 305 339  APTT  --  24  LABPROT  --  13.5  INR  --  1.01  CREATININE  --  0.80    Estimated Creatinine Clearance: 136.2 mL/min (by C-G formula based on SCr of 0.8 mg/dL).   Medical History: Past Medical History:  Diagnosis Date  . Cough 07/25/2015  . Dilated cardiomyopathy ([place])   . H/O: hypothyroidism   . Heart palpitations   . Hypertension   . Iron deficiency   . Large breasts   . Morbid obesity ([place])   . Postpartum hemorrhage   . Thyroid disease     Assessment: Pharmacy consulted to dose and monitor heparin drip in this 50 year old female who was diagnosed with bilateral PE today. Patient was not taking anticoagulants prior to admission but did receive one dose of rivaroxaban in the ED @ 1830 this evening. Spoke with MD - heparin drip will begin at 0630 tomorrow morning.   Baseline labs ordered  Goal of Therapy:  Heparin level 0.3-0.7 units/ml aPTT 66-102 seconds Monitor platelets by anticoagulation protocol: Yes   Plan:  Give 3000 units bolus x 1 Start heparin infusion at 1700 units/hr Check anti-Xa level in 6 hours and daily while on heparin Continue to monitor H&H and platelets  Lenis Noon, PharmD Clinical Pharmacist 07/25/2015,7:21 PM

## 2015-07-25 NOTE — ED Notes (Signed)
Patient ambulated in hallway. Patient denies any CP or SOB. However, patient was sightly tachycardiac at 104 and her SpO2 dropped to 90%. MD notified and aware.

## 2015-07-25 NOTE — ED Provider Notes (Signed)
[facility] Emergency Department Provider Note   ____________________________________________  Time seen: Approximately 6 PM I have reviewed the triage vital signs and the triage nursing note.  HISTORY  Chief Complaint Shortness of Breath   Historian Patient  HPI KADINCE SAEED is a 50 y.o. female who thought that she had a viral upper respiratory infection she's having a little bit of shortness of breath over the past week or so. She had an outpatient for a lower extremity ultrasound scheduled for today which was positive for DVT study and she was therefore ordered to have an outpatient CT scan for PE and found to have bilateral PEs.  At rest patient has no shortness of breath or chest pain or palpitations or pleuritic chest pain. She's had no fevers. No abdominal pain.    Past Medical History:  Diagnosis Date  . Cough 07/25/2015  . Dilated cardiomyopathy ([place])   . H/O: hypothyroidism   . Heart palpitations   . Hypertension   . Iron deficiency   . Large breasts   . Morbid obesity ([place])   . Postpartum hemorrhage   . Thyroid disease     Patient Active Problem List   Diagnosis Date Noted  . OSA (obstructive sleep apnea) 05/18/2015  . Thyroid nodule 12/17/2014  . Heavy menstrual period 09/02/2014  . Gastroesophageal reflux disease without esophagitis 09/02/2014  . Extreme obesity ([place]) 06/22/2014  . Large breasts 06/22/2014  . Anemia, iron deficiency 06/22/2014  . Benign hypertension 06/22/2014  . H/O hyperthyroidism 06/22/2014  . LVH (left ventricular hypertrophy) 06/22/2014  . 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    History reviewed. No pertinent surgical history.  Current Outpatient Rx  . Order #: [id number] Class: Print  . Order #: [id number] Class: Normal  . Order #: [id number] Class: Normal  . Order #: [id number] Class: Print  Carvedilol, iron  Allergies Aspirin  Family History  Problem Relation Age of Onset  . Family  history unknown: Yes    Social History Social History  Substance Use Topics  . Smoking status: Never Smoker  . Smokeless tobacco: Never Used  . Alcohol use No     Comment: occasional    Review of Systems  Constitutional: Negative for fever. Eyes: Negative for visual changes. ENT: Negative for sore throat. Cardiovascular: Negative for chest pain. Respiratory: Positive for shortness of breath. Gastrointestinal: Negative for abdominal pain, vomiting and diarrhea. Genitourinary: Negative for dysuria. Musculoskeletal: Negative for back pain. Skin: Negative for rash. Neurological: Negative for headache. 10 point Review of Systems otherwise negative ____________________________________________   PHYSICAL EXAM:  VITAL SIGNS: ED Triage Vitals  Enc Vitals Group     BP 07/25/15 1736 (!) 152/101     Pulse Rate 07/25/15 1736 83     Resp 07/25/15 1736 18     Temp 07/25/15 1736 98.5 F (36.9 C)     Temp Source 07/25/15 1736 Oral     SpO2 07/25/15 1736 94 %     Weight 07/25/15 1736 (!) 349 lb (158.3 kg)     Height 07/25/15 1736 5\' 7"  (1.702 m)     Head Circumference --      Peak Flow --      Pain Score 07/25/15 1737 0     Pain Loc --      Pain Edu? --      Excl. in [place]? --      Constitutional: Alert and oriented. Well appearing and in no distress. HEENT   Head: Normocephalic and atraumatic.  Eyes: Conjunctivae are normal. PERRL. Normal extraocular movements.      Ears:         Nose: No congestion/rhinnorhea.   Mouth/Throat: Mucous membranes are moist.   Neck: No stridor. Cardiovascular/Chest: Normal rate, regular rhythm.  No murmurs, rubs, or gallops. Respiratory: Normal respiratory effort without tachypnea nor retractions. Breath sounds are clear and equal bilaterally. No wheezes/rales/rhonchi. Gastrointestinal: Soft. No distention, no guarding, no rebound. Nontender.    Genitourinary/rectal:Deferred Musculoskeletal: Nontender with normal range of motion in  all extremities. No joint effusions.  No lower extremity tenderness.  No edema. Neurologic:  Normal speech and language. No gross or focal neurologic deficits are appreciated. Skin:  Skin is warm, dry and intact. No rash noted. Psychiatric: Mood and affect are normal. Speech and behavior are normal. Patient exhibits appropriate insight and judgment.  ____________________________________________   EKG I, Lisa Roca, MD, the attending physician have personally viewed and interpreted all ECGs.  69 bpm. Normal sinus rhythm. Narrow QRS. Normal axis. Nonspecific ST and T-wave. ____________________________________________  LABS (pertinent positives/negatives)  Labs Reviewed  CBC WITH DIFFERENTIAL/PLATELET - Abnormal; Notable for the following:       Result Value   Hemoglobin 8.1 (*)    HCT 27.1 (*)    MCV 70.5 (*)    MCH 21.0 (*)    MCHC 29.8 (*)    RDW 19.2 (*)    All other components within normal limits  BASIC METABOLIC PANEL - Abnormal; Notable for the following:    Calcium 8.2 (*)    All other components within normal limits  PROTIME-INR  APTT    ____________________________________________  RADIOLOGY All Xrays were viewed by me. Imaging interpreted by Radiologist.  CT scan with contrast chest:IMPRESSION: 1. Acute bilateral pulmonary embolus. No evidence for right heart strain. 2. Bilateral tiny pulmonary nodules. No follow-up needed if patient is low-risk (and has no known or suspected primary neoplasm). Non-contrast chest CT can be considered in 12 months if patient is high-risk. This recommendation follows the consensus statement: Guidelines for Management of Incidental Pulmonary Nodules Detected on CT Images:From the Fleischner Society 2017; published online before print (10.1148/radiol.IJ:2314499). 3. 2.1 cm left adrenal nodule has been incompletely visualized. CT imaging of the abdomen before and after contrast could be performed for further  evaluation. Electronically Signed: By: Misty Stanley M.D.  Ultrasound right lower extremity:IMPRESSION: Sonographic survey of the right lower extremity is positive for occlusive DVT of the femoral vein extending distally to involve the popliteal vein, and proximally into the common femoral vein. DVT of the common femoral vein is not occlusive and does not appear to extend into the pelvis. The technologist performing the study has called the preliminary report to the ordering physician. Signed, Dulcy Fanny. Earleen Newport, DO [facility] Radiology Electronically Signed   By: Corrie Mckusick D.O. __________________________________________  PROCEDURES  Procedure(s) performed: None  Critical Care performed: None  ____________________________________________   ED COURSE / ASSESSMENT AND PLAN  Pertinent labs & imaging results that were available during my care of the patient were reviewed by me and considered in my medical decision making (see chart for details).   I reviewed the patient's ultrasound showing positive for right lower show any DVT and CT scan showing bilateral lobar segmental PE. There is no evidence of heart strain.  I discussed this case with Dr. Grayland Ormond (hematologist) and as patient at rest is without symptoms and has O2 sat 99% on room air, and no hypoxia or tachycardia or  hypotension.  He felt like that if she was continuing to be hemodynamically stable she could be started on Xarelto or Eloquis and followed up in the office this week.  Ultimately, when patient was walked, she did become tachycardic to about 105 and hypoxic down to 90%, given these symptoms I do think she needs hospitalization for this acute bilateral pulmonary emboli.    CONSULTATIONS:   Dr. Grayland Ormond, hematologist.  Hospitalist for admission.   Patient / Family / Caregiver informed of clinical course, medical decision-making process, and agree with  plan.     ___________________________________________   FINAL CLINICAL IMPRESSION(S) / ED DIAGNOSES   Final diagnoses:  Other pulmonary embolism without acute cor pulmonale ([place])              Note: This dictation was prepared with Dragon dictation. Any transcriptional errors that result from this process are unintentional    Lisa Roca, MD 07/25/15 1845

## 2015-07-26 ENCOUNTER — Inpatient Hospital Stay (HOSPITAL_COMMUNITY)
Admit: 2015-07-26 | Discharge: 2015-07-26 | Disposition: A | Discharge status: Home / Self Care | Payer: Commercial Managed Care - HMO | Attending: Internal Medicine | Admitting: Internal Medicine

## 2015-07-26 DIAGNOSIS — I2699 Other pulmonary embolism without acute cor pulmonale: Secondary | ICD-10-CM

## 2015-07-26 LAB — HEPARIN LEVEL (UNFRACTIONATED)
HEPARIN UNFRACTIONATED: 0.6 [IU]/mL (ref 0.30–0.70)
HEPARIN UNFRACTIONATED: 0.24 [IU]/mL — AB (ref 0.30–0.70)

## 2015-07-26 LAB — BASIC METABOLIC PANEL
ANION GAP: 7 (ref 5–15)
BUN: 9 mg/dL (ref 6–20)
CHLORIDE: 103 mmol/L (ref 101–111)
CO2: 29 mmol/L (ref 22–32)
Calcium: 8.3 mg/dL — ABNORMAL LOW (ref 8.9–10.3)
Creatinine, Ser: 0.61 mg/dL (ref 0.44–1.00)
GFR calc non Af Amer: >60 mL/min (ref >60)
Glucose, Bld: 115 mg/dL — ABNORMAL HIGH (ref 65–99)
POTASSIUM: 3.5 mmol/L (ref 3.5–5.1)
SODIUM: 139 mmol/L (ref 135–145)

## 2015-07-26 LAB — IRON AND TIBC
Iron: 9 ug/dL — ABNORMAL LOW (ref 28–170)
Saturation Ratios: 2 % — ABNORMAL LOW (ref 10.4–31.8)
TIBC: 397 ug/dL (ref 250–450)
UIBC: 388 ug/dL

## 2015-07-26 LAB — RETICULOCYTES
RBC.: 3.7 MIL/uL — ABNORMAL LOW (ref 3.80–5.20)
RETIC COUNT ABSOLUTE: 70.3 10*3/uL (ref 19.0–183.0)
RETIC CT PCT: 1.9 % (ref 0.4–3.1)

## 2015-07-26 LAB — CBC WITH DIFFERENTIAL/PLATELET
BASOS PCT: 1 %
Basophils Absolute: 0.1 10*3/uL (ref 0–0.1)
EOS ABS: 0.2 10*3/uL (ref 0–0.7)
Eosinophils Relative: 2 %
HCT: 26.1 % — ABNORMAL LOW (ref 35.0–47.0)
HEMOGLOBIN: 7.9 g/dL — AB (ref 12.0–16.0)
LYMPHS ABS: 2.2 10*3/uL (ref 1.0–3.6)
Lymphocytes Relative: 31 %
MCH: 21.6 pg — ABNORMAL LOW (ref 26.0–34.0)
MCHC: 30.5 g/dL — ABNORMAL LOW (ref 32.0–36.0)
MCV: 70.7 fL — ABNORMAL LOW (ref 80.0–100.0)
Monocytes Absolute: 0.7 10*3/uL (ref 0.2–0.9)
Monocytes Relative: 9 %
NEUTROS PCT: 57 %
Neutro Abs: 4.1 10*3/uL (ref 1.4–6.5)
Platelets: 331 10*3/uL (ref 150–440)
RBC: 3.68 MIL/uL — AB (ref 3.80–5.20)
RDW: 19.2 % — ABNORMAL HIGH (ref 11.5–14.5)
WBC: 7.2 10*3/uL (ref 3.6–11.0)

## 2015-07-26 LAB — HEMOGLOBIN A1C: HEMOGLOBIN A1C: 5.1 % (ref 4.0–6.0)

## 2015-07-26 LAB — FOLATE: FOLATE: 12.2 ng/mL (ref >5.9)

## 2015-07-26 LAB — ECHOCARDIOGRAM COMPLETE
Height: 67 in
WEIGHTICAEL: 4881.6 [oz_av]

## 2015-07-26 LAB — APTT
aPTT: 37 seconds — ABNORMAL HIGH (ref 24–36)
aPTT: 33 seconds (ref 24–36)

## 2015-07-26 LAB — FERRITIN: Ferritin: 7 ng/mL — ABNORMAL LOW (ref 11–307)

## 2015-07-26 LAB — ANTITHROMBIN III: ANTITHROMB III FUNC: 85 % (ref 75–120)

## 2015-07-26 LAB — VITAMIN B12: Vitamin B-12: 164 pg/mL — ABNORMAL LOW (ref 180–914)

## 2015-07-26 MED ORDER — VITAMIN B-12 100 MCG PO TABS
100.0000 ug | ORAL_TABLET | Freq: Every day | ORAL | Status: DC
  Administered 2015-07-26: 100 ug via ORAL
  Filled 2015-07-26: qty 1

## 2015-07-26 MED ORDER — HEPARIN BOLUS VIA INFUSION
1500.0000 [IU] | Freq: Once | INTRAVENOUS | Status: AC
  Administered 2015-07-26: 1500 [IU] via INTRAVENOUS
  Filled 2015-07-26: qty 1500

## 2015-07-26 NOTE — Progress Notes (Signed)
ANTICOAGULATION CONSULT NOTE - Follow up [place] for heparin drip Indication: pulmonary embolus  Allergies  Allergen Reactions  . Aspirin Hives    Patient Measurements: Height: 5\' 7"  (170.2 cm) Weight: (!) 305 lb 1.6 oz (138.4 kg) IBW/kg (Calculated) : 61.6 Heparin Dosing Weight: 101 kg  Vital Signs: Temp: 98.7 F (37.1 C) (07/25 1514) Temp Source: Oral (07/25 1514) BP: 134/65 (07/25 1514) Pulse Rate: 83 (07/25 1514)  Labs:  Recent Labs  07/25/15 1218 07/25/15 1734 07/25/15 1745 07/26/15 0412 07/26/15 1243 07/26/15 1839  HGB 7.7* 8.1*  --  7.9*  --   --   HCT 26.2* 27.1*  --  26.1*  --   --   PLT 305 339  --  331  --   --   APTT  --  24  --   --  37*  --   LABPROT  --  13.5  --   --   --   --   INR  --  1.01  --   --   --   --   HEPARINUNFRC  --   --  <0.10*  --  0.60 0.24*  CREATININE  --  0.80  --  0.61  --   --     Estimated Creatinine Clearance: 125.3 mL/min (by C-G formula based on SCr of 0.8 mg/dL).   Medical History: Past Medical History:  Diagnosis Date  . Cough 07/25/2015  . Dilated cardiomyopathy ([place])   . H/O: hypothyroidism   . Heart palpitations   . Hypertension   . Iron deficiency   . Large breasts   . Morbid obesity ([place])   . Postpartum hemorrhage   . Thyroid disease     Assessment: Pharmacy consulted to dose and monitor heparin drip in this 50 year old female who was diagnosed with bilateral PE today. Patient was not taking anticoagulants prior to admission but did receive one dose of rivaroxaban in the ED @ 1830 this evening. Spoke with MD - heparin drip will begin at 0630 tomorrow morning.   Baseline labs ordered  Goal of Therapy:  Heparin level 0.3-0.7 units/ml aPTT 66-102 seconds Monitor platelets by anticoagulation protocol: Yes   Plan:  Give 3000 units bolus x 1 Start heparin infusion at 1700 units/hr Check anti-Xa level in 6 hours and daily while on heparin Continue to monitor H&H and platelets   7/25  Heparin level at 1243= 0.6.  Will continue current Heparin rate of 1700 units/hr. Will check confirmatory level in 6 hrs.  7/24 1839 HL: 0.24 - Will give heparin bolus 1500 units, increase rate to 1900 units/hr  Patient received rivaroxaban x 1 on 07/24, previous heparin level did not correlated with aPTT (HL therapeutic, aPTT subtherapeutic).   Will check both in 6 hours. If levels do not correlate, consider dosing based on aPTT until levels match.   Cheri Guppy, PharmD Clinical Pharmacist 07/26/2015,7:41 PM

## 2015-07-26 NOTE — Progress Notes (Signed)
Patient resting in bed, family at bedside. Pain to LUE relieved with tylenol, swelling improving with elevation on 2 pillows. Awaiting doppler U/S for LUE. Continue heparin drip per pharmacy dose (17). Will continue to monitor.

## 2015-07-26 NOTE — Progress Notes (Signed)
ANTICOAGULATION CONSULT NOTE - Follow up [place] for heparin drip Indication: pulmonary embolus  Allergies  Allergen Reactions  . Aspirin Hives    Patient Measurements: Height: 5\' 7"  (170.2 cm) Weight: (!) 305 lb 1.6 oz (138.4 kg) IBW/kg (Calculated) : 61.6 Heparin Dosing Weight: 101 kg  Vital Signs: Temp: 98.3 F (36.8 C) (07/25 0357) Temp Source: Oral (07/25 0357) BP: 153/91 (07/25 0849) Pulse Rate: 77 (07/25 0849)  Labs:  Recent Labs  07/25/15 1218 07/25/15 1734 07/25/15 1745 07/26/15 0412 07/26/15 1243  HGB 7.7* 8.1*  --  7.9*  --   HCT 26.2* 27.1*  --  26.1*  --   PLT 305 339  --  331  --   APTT  --  24  --   --  37*  LABPROT  --  13.5  --   --   --   INR  --  1.01  --   --   --   HEPARINUNFRC  --   --  <0.10*  --  0.60  CREATININE  --  0.80  --  0.61  --     Estimated Creatinine Clearance: 125.3 mL/min (by C-G formula based on SCr of 0.8 mg/dL).   Medical History: Past Medical History:  Diagnosis Date  . Cough 07/25/2015  . Dilated cardiomyopathy ([place])   . H/O: hypothyroidism   . Heart palpitations   . Hypertension   . Iron deficiency   . Large breasts   . Morbid obesity ([place])   . Postpartum hemorrhage   . Thyroid disease     Assessment: Pharmacy consulted to dose and monitor heparin drip in this 50 year old female who was diagnosed with bilateral PE today. Patient was not taking anticoagulants prior to admission but did receive one dose of rivaroxaban in the ED @ 1830 this evening. Spoke with MD - heparin drip will begin at 0630 tomorrow morning.   Baseline labs ordered  Goal of Therapy:  Heparin level 0.3-0.7 units/ml aPTT 66-102 seconds Monitor platelets by anticoagulation protocol: Yes   Plan:  Give 3000 units bolus x 1 Start heparin infusion at 1700 units/hr Check anti-Xa level in 6 hours and daily while on heparin Continue to monitor H&H and platelets   7/25 Heparin level at 1243= 0.6.  Will continue current  Heparin rate of 1700 units/hr. Will check confirmatory level in 6 hrs.  Noralee Space, PharmD Clinical Pharmacist 07/26/2015,2:17 PM

## 2015-07-26 NOTE — Progress Notes (Signed)
[facility] NAME: Madeline Dominguez    MR#:  [id number]  DATE OF BIRTH:  [birth year]  SUBJECTIVE:  CHIEF COMPLAINT:  Patient is resting comfortably. Denies any chest pain or shortness of breath  REVIEW OF SYSTEMS:  CONSTITUTIONAL: No fever, fatigue or weakness.  EYES: No blurred or double vision.  EARS, NOSE, AND THROAT: No tinnitus or ear pain.  RESPIRATORY: No cough, shortness of breath, wheezing or hemoptysis.  CARDIOVASCULAR: No chest pain, orthopnea, edema.  GASTROINTESTINAL: No nausea, vomiting, diarrhea or abdominal pain.  GENITOURINARY: No dysuria, hematuria.  ENDOCRINE: No polyuria, nocturia,  HEMATOLOGY: No anemia, easy bruising or bleeding SKIN: No rash or lesion. MUSCULOSKELETAL: No joint pain or arthritis.   NEUROLOGIC: No tingling, numbness, weakness.  PSYCHIATRY: No anxiety or depression.   DRUG ALLERGIES:   Allergies  Allergen Reactions  . Aspirin Hives    VITALS:  Blood pressure (!) 153/91, pulse 77, temperature 98.3 F (36.8 C), temperature source Oral, resp. rate 18, height 5\' 7"  (1.702 m), weight (!) 138.4 kg (305 lb 1.6 oz), last menstrual period 07/25/2015, SpO2 93 %.  PHYSICAL EXAMINATION:  GENERAL:  50 y.o.-year-old patient lying in the bed with no acute distress.  EYES: Pupils equal, round, reactive to light and accommodation. No scleral icterus. Extraocular muscles intact.  HEENT: Head atraumatic, normocephalic. Oropharynx and nasopharynx clear.  NECK:  Supple, no jugular venous distention. No thyroid enlargement, no tenderness.  LUNGS: Normal breath sounds bilaterally, no wheezing, rales,rhonchi or crepitation. No use of accessory muscles of respiration.  CARDIOVASCULAR: S1, S2 normal. No murmurs, rubs, or gallops.  ABDOMEN: Soft, nontender, nondistended. Bowel sounds present. No organomegaly or mass.  EXTREMITIES: No pedal edema, cyanosis, or clubbing.  NEUROLOGIC: Cranial nerves II through  XII are intact. Muscle strength 5/5 in all extremities. Sensation intact. Gait not checked.  PSYCHIATRIC: The patient is alert and oriented x 3.  SKIN: No obvious rash, lesion, or ulcer.    LABORATORY PANEL:   CBC  Recent Labs Lab 07/26/15 0412  WBC 7.2  HGB 7.9*  HCT 26.1*  PLT 331   ------------------------------------------------------------------------------------------------------------------  Chemistries   Recent Labs Lab 07/26/15 0412  NA 139  K 3.5  CL 103  CO2 29  GLUCOSE 115*  BUN 9  CREATININE 0.61  CALCIUM 8.3*   ------------------------------------------------------------------------------------------------------------------  Cardiac Enzymes No results for input(s): TROPONINI in the last 168 hours. ------------------------------------------------------------------------------------------------------------------  RADIOLOGY:  Ct Angio Chest Pe W Or Wo Contrast  Addendum Date: 07/25/2015   ADDENDUM REPORT: 07/25/2015 17:35 ADDENDUM: Critical Value/emergent results were called by me at the time of interpretation on 07/25/2015 at 5:33 pm to Dr. Rosanna Randy, covering for Dr. Steele Sizer , who verbally acknowledged these results. Given that this exam was completed after normal office hours, I personally discussed the results of the exam with the patient and her husband and and let them know that I had talked to Dr. Rosanna Randy. I instructed them to go immediately to the emergency department, given the clinical significance of pulmonary embolus. The patient voiced understanding of the situation and agree to proceed directly to the [facility] emergency room. I also called Marya Amsler, the charge nurse in the emergency room, to alert him of the patient's impending arrival. Electronically Signed   By: Misty Stanley M.D.   On: 07/25/2015 17:35  Result Date: 07/25/2015 CLINICAL DATA:  Cough and shortness of breath for 1 month. Positive right lower extremity DVT. EXAM: CT ANGIOGRAPHY CHEST  WITH  CONTRAST TECHNIQUE: Multidetector CT imaging of the chest was performed using the standard protocol during bolus administration of intravenous contrast. Multiplanar CT image reconstructions and MIPs were obtained to evaluate the vascular anatomy. CONTRAST:  100 cc Isovue 370 COMPARISON:  None. FINDINGS: Cardiovascular: Heart is upper normal in a mildly enlarged. No thoracic aortic aneurysm. Pulmonary embolic disease is seen and lobar and segmental pulmonary arteries to the right lower lobe. There is evidence of pulmonary embolus and segmental left upper lobe pulmonary arteries. Mediastinum/Nodes: No mediastinal lymphadenopathy. 1.9 cm right thyroid nodule noted. There is no hilar lymphadenopathy. There is no axillary lymphadenopathy. The esophagus has normal imaging features. Lungs/Pleura: 5 mm subpleural nodule identified anterior right lung. 3 mm right lung nodule seen on image 40 series 5. Other scattered tiny bilateral pulmonary nodules are evident, measuring 5 mm or less in diameter. There is some atelectasis at the bases. No pulmonary edema or pleural effusion. Upper Abdomen: 2.1 cm left adrenal nodule incompletely visualized. Musculoskeletal: Bone windows reveal no worrisome lytic or sclerotic osseous lesions. Review of the MIP images confirms the above findings. IMPRESSION: 1. Acute bilateral pulmonary embolus. No evidence for right heart strain. 2. Bilateral tiny pulmonary nodules. No follow-up needed if patient is low-risk (and has no known or suspected primary neoplasm). Non-contrast chest CT can be considered in 12 months if patient is high-risk. This recommendation follows the consensus statement: Guidelines for Management of Incidental Pulmonary Nodules Detected on CT Images:From the Fleischner Society 2017; published online before print (10.1148/radiol.SG:5268862). 3. 2.1 cm left adrenal nodule has been incompletely visualized. CT imaging of the abdomen before and after contrast could be  performed for further evaluation. Electronically Signed: By: Misty Stanley M.D. On: 07/25/2015 17:15  US Venous Img Lower Unilateral Right  Result Date: 07/25/2015 CLINICAL DATA:  50 year old female with a history of right leg swelling EXAM: RIGHT LOWER EXTREMITY VENOUS DOPPLER ULTRASOUND TECHNIQUE: Gray-scale sonography with graded compression, as well as color Doppler and duplex ultrasound were performed to evaluate the lower extremity deep venous systems from the level of the common femoral vein and including the common femoral, femoral, profunda femoral, popliteal and calf veins including the posterior tibial, peroneal and gastrocnemius veins when visible. The superficial great saphenous vein was also interrogated. Spectral Doppler was utilized to evaluate flow at rest and with distal augmentation maneuvers in the common femoral, femoral and popliteal veins. COMPARISON:  None. FINDINGS: Contralateral Common Femoral Vein: Respiratory phasicity is normal and symmetric with the symptomatic side. No evidence of thrombus. Normal compressibility. Common Femoral Vein: Nonocclusive thrombus extending from the femoral vein. Respiratory phasicity and compressibility maintained. Saphenofemoral Junction: Nonocclusive thrombus at the common femoral vein extending up from the femoral vein. Profunda Femoral Vein: Partially occlusive thrombus involving the profunda femoris vein at the confluence. Femoral Vein: Occlusive thrombus of the femoral vein extending as far proximally as into the common femoral vein, and distally through the popliteal vein. Popliteal Vein: Nonocclusive thrombus of the popliteal vein extending into the femoral vein without involvement of the tibial veins. Calf Veins: Peroneal vein not visualized. No occlusive thrombus of the posterior tibial vein. Superficial Great Saphenous Vein: No evidence of thrombus. Normal compressibility and flow on color Doppler imaging. Other Findings:  None. IMPRESSION:  Sonographic survey of the right lower extremity is positive for occlusive DVT of the femoral vein extending distally to involve the popliteal vein, and proximally into the common femoral vein. DVT of the common femoral vein is not occlusive and does not appear to  extend into the pelvis. The technologist performing the study has called the preliminary report to the ordering physician. Signed, Dulcy Fanny. Earleen Newport, DO [facility] Radiology Electronically Signed   By: Corrie Mckusick D.O.   On: 07/25/2015 16:18   EKG:   Orders placed or performed during the hospital encounter of 07/25/15  . ED EKG  . ED EKG    ASSESSMENT AND PLAN:   Necol Syrett  is a 50 y.o. female with a known history of Obesity, left ventricular-trophy, hypertension, menorrhagia and on hormonal treatment for this same, hypertension presents to the hospital secondary to right lower extremity swelling and shortness of breath.  #1 bilateral PE with right lower extremity DVT-the risk factors include obesity, hormonal treatment and immobile status -Hypercoagulable workup pending -on IV heparin drip and possible discharge on NOAC  once heart rate is more stable. - Monitor Echo to rule out right heart strain. Monitor on off unit telemetry. No significant EKG changes. -Discontinued oral contraceptives -She can do them of the chest with tiny bilateral pulmonary nodules, patient being a symptomatic might not need embolectomy. Awaiting echocardiogram. If needed will consult vascular surgery  #2 chronic anemia-chronic iron deficiency anemia. Hemoglobin last year was 10, this year has been staying around 8.   stool occult cards ordered -Likely due to menorrhagia. -Continue taking iron pills.  -Advised the risk with anticoagulation now, recommended frequent follow-up and outpatient hematology follow-up is recommended   #3 hypertension-continue Coreg  #4 history of multinodular  goiter-nml TSH, not on any thyroid supplements as outpatient.  #5 DVT prophylaxis- on heparin drip     All the records are reviewed and case discussed with Care Management/Social Workerr. Management plans discussed with the patient, family and they are in agreement.  CODE STATUS:   TOTAL TIME TAKING CARE OF THIS PATIENT: 36  minutes.   POSSIBLE D/C IN am  DAYS, DEPENDING ON CLINICAL CONDITION.  Note: This dictation was prepared with Dragon dictation along with smaller phrase technology. Any transcriptional errors that result from this process are unintentional.   Nicholes Mango M.D on 07/26/2015 at 12:42 PM  Between 7am to 6pm - Pager - [phone] After 6pm go to [email or web address] - password [email or web address] [place] [facility]  Office  [phone]  CC: Primary care physician; Loistine Chance, MD

## 2015-07-26 NOTE — Progress Notes (Signed)
MD paged, noted swelling to LUE, pt c/o pain to upper arm are. Rings removed from left hand and given to patient per patient request. New orders placed for doppler u/s to LUE to r/o DVT. Per Dr. Margaretmary Eddy, plan to continue treatment with heparin if results of u/s positive or negative. Will continue to monitor, hand and lower portion of arm elevated on 2 pillows to reduce swelling.

## 2015-07-26 NOTE — Care Management Note (Signed)
Case Management Note  Patient Details  Name: Madeline Dominguez MRN: [id number] Date of Birth: June 30, 1965  Subjective/Objective:   Spoke with patient and husband at the bedside. Patient is from home and independent, states that she drives self and she does not use any DME or assistive devices. Patients PCP is Dr Ancil Boozer at [facility]. She gets her medicaitons filled at [facility]. Insure with [facility].   Action/Plan: Home with Self care at time of discharge.   Expected Discharge Date:                  Expected Discharge Plan:  Home/Self Care  In-House Referral:     Discharge planning Services  CM Consult  Post Acute Care Choice:  NA Choice offered to:  NA  DME Arranged:    DME Agency:  NA  HH Arranged:  NA HH Agency:     Status of Service:  Completed, signed off  If discussed at [place] of Stay Meetings, dates discussed:    Additional Comments:  Alvie Heidelberg, RN 07/26/2015, 9:30 AM

## 2015-07-26 NOTE — Progress Notes (Signed)
*  PRELIMINARY RESULTS* Echocardiogram 2D Echocardiogram has been performed.  Madeline Dominguez 07/26/2015, 1:21 PM

## 2015-07-27 ENCOUNTER — Telehealth: Payer: Self-pay | Admitting: Family Medicine

## 2015-07-27 ENCOUNTER — Other Ambulatory Visit: Payer: Self-pay | Admitting: Oncology

## 2015-07-27 ENCOUNTER — Inpatient Hospital Stay: Payer: Commercial Managed Care - HMO

## 2015-07-27 ENCOUNTER — Encounter: Payer: Self-pay | Admitting: Family Medicine

## 2015-07-27 DIAGNOSIS — E538 Deficiency of other specified B group vitamins: Secondary | ICD-10-CM | POA: Insufficient documentation

## 2015-07-27 DIAGNOSIS — D509 Iron deficiency anemia, unspecified: Secondary | ICD-10-CM | POA: Insufficient documentation

## 2015-07-27 LAB — APTT: aPTT: 37 seconds — ABNORMAL HIGH (ref 24–36)

## 2015-07-27 LAB — CBC
HCT: 26.6 % — ABNORMAL LOW (ref 35.0–47.0)
Hemoglobin: 7.9 g/dL — ABNORMAL LOW (ref 12.0–16.0)
MCH: 21.1 pg — ABNORMAL LOW (ref 26.0–34.0)
MCHC: 29.6 g/dL — ABNORMAL LOW (ref 32.0–36.0)
MCV: 71.2 fL — ABNORMAL LOW (ref 80.0–100.0)
PLATELETS: 341 10*3/uL (ref 150–440)
RBC: 3.74 MIL/uL — AB (ref 3.80–5.20)
RDW: 19.2 % — ABNORMAL HIGH (ref 11.5–14.5)
WBC: 6.7 10*3/uL (ref 3.6–11.0)

## 2015-07-27 LAB — BETA-2-GLYCOPROTEIN I ABS, IGG/M/A
Beta-2-Glycoprotein I IgA: <9 GPI IgA units (ref 0–25)
Beta-2-Glycoprotein I IgM: <9 GPI IgM units (ref 0–32)

## 2015-07-27 LAB — LUPUS ANTICOAGULANT PANEL
DRVVT: 41.7 s (ref 0.0–47.0)
PTT LA: 30.9 s (ref 0.0–51.9)

## 2015-07-27 LAB — PROTEIN S ACTIVITY: Protein S Activity: 73 % (ref 63–140)

## 2015-07-27 LAB — HOMOCYSTEINE: HOMOCYSTEINE-NORM: 14.5 umol/L (ref 0.0–15.0)

## 2015-07-27 LAB — PROTEIN S, TOTAL: PROTEIN S AG TOTAL: 82 % (ref 60–150)

## 2015-07-27 LAB — PROTEIN C ACTIVITY: PROTEIN C ACTIVITY: 142 % (ref 73–180)

## 2015-07-27 LAB — HEPARIN LEVEL (UNFRACTIONATED): HEPARIN UNFRACTIONATED: 0.23 [IU]/mL — AB (ref 0.30–0.70)

## 2015-07-27 MED ORDER — CYANOCOBALAMIN 1000 MCG/ML IJ SOLN
1000.0000 ug | Freq: Once | INTRAMUSCULAR | Status: AC
  Administered 2015-07-27: 1000 ug via INTRAMUSCULAR
  Filled 2015-07-27: qty 1

## 2015-07-27 MED ORDER — APIXABAN 5 MG PO TABS
10.0000 mg | ORAL_TABLET | Freq: Two times a day (BID) | ORAL | Status: DC
  Administered 2015-07-27: 10 mg via ORAL
  Filled 2015-07-27: qty 2

## 2015-07-27 MED ORDER — VITAMIN B-12 1000 MCG PO TABS
1000.0000 ug | ORAL_TABLET | Freq: Every day | ORAL | Status: DC
  Administered 2015-07-27: 1000 ug via ORAL
  Filled 2015-07-27: qty 1

## 2015-07-27 MED ORDER — APIXABAN 5 MG PO TABS: ORAL_TABLET | ORAL | 0 refills | Status: DC

## 2015-07-27 MED ORDER — CYANOCOBALAMIN 1000 MCG PO TABS: 1000.0000 ug | ORAL_TABLET | Freq: Every day | ORAL | 0 refills | Status: AC

## 2015-07-27 MED ORDER — HEPARIN BOLUS VIA INFUSION
3000.0000 [IU] | Freq: Once | INTRAVENOUS | Status: AC
  Administered 2015-07-27: 3000 [IU] via INTRAVENOUS
  Filled 2015-07-27: qty 3000

## 2015-07-27 MED ORDER — APIXABAN 5 MG PO TABS: 5.0000 mg | ORAL_TABLET | Freq: Two times a day (BID) | ORAL | Status: DC

## 2015-07-27 MED ORDER — SODIUM CHLORIDE 0.9 % IV SOLN
400.0000 mg | Freq: Once | INTRAVENOUS | Status: AC
  Administered 2015-07-27: 400 mg via INTRAVENOUS
  Filled 2015-07-27: qty 20

## 2015-07-27 NOTE — Progress Notes (Signed)
Patient ID: Madeline Dominguez, female   DOB: 1965-03-09, 50 y.o.   MRN: [id number] [facility] at [facility] was admitted to the Hospital on 07/25/2015 and Discharged  07/27/2015 and should be excused from work/school   for 9  days starting 07/25/2015 , may return to work/school without any restrictions.  Loletha Grayer M.D on 07/27/2015,at 9:09 AM  [place] [facility] [place]

## 2015-07-27 NOTE — Progress Notes (Signed)
IV site scant  bleeding. Patient denies pain. Dressing changed. Expressed concerns to MD no new orders given.

## 2015-07-27 NOTE — Care Management (Addendum)
Spoke with patients husband Kyung Rudd) in room. Patient off the floor for procedure. Gave 30 day free trial Eliquis card and $10 monthly co-pay cards to husband and explained use.  Patient will need to be on Eliquis for 6 months. Husband expressed understanding.

## 2015-07-27 NOTE — Discharge Summary (Signed)
[place] [facility] [place] NAME: Madeline Dominguez    MR#:  [id number]  DATE OF BIRTH:  [birth year]  DATE OF ADMISSION:  07/25/2015 ADMITTING PHYSICIAN: Gladstone Lighter, MD  DATE OF DISCHARGE: 07/27/2015  PRIMARY CARE PHYSICIAN: Loistine Chance, MD    ADMISSION DIAGNOSIS:  DVT (deep venous thrombosis), right [I82.401] Other pulmonary embolism without acute cor pulmonale (HCC) [I26.99]  DISCHARGE DIAGNOSIS:  Active Problems:   Pulmonary embolism ([place])   SECONDARY DIAGNOSIS:   Past Medical History:  Diagnosis Date  . Cough 07/25/2015  . Dilated cardiomyopathy ([place])   . H/O: hypothyroidism   . Heart palpitations   . Hypertension   . Iron deficiency   . Large breasts   . Morbid obesity ([place])   . Postpartum hemorrhage   . Thyroid disease     HOSPITAL COURSE:   1. Bilateral pulmonary embolism with right lower extremity DVT. Patient's risk was birth control pill and obesity. Hypercoagulable workup pending. Patient was started on IV heparin drip. Switched over to eliquis 10 mg twice a day for 1 week and then 5 mg twice a day afterwards. Patient will need at least 6 months worth of treatment. Echocardiogram showing no heart strain. Benefits and risks of anticoagulation explained to the patient and family. 2. Iron deficiency anemia, B12 deficiency and pica with ice eating. Ferrous sulfate will be continued. Consider B12 injections as outpatient. I gave 1 B12 injection here and oral B12 upon discharge home. Patient advised to stop eating ice. Close follow-up blood counts needed as outpatient being that she is on anticoagulation. Hemoglobin in the hospital stable but low. 400 mg IV Venofer given while in the hospital. 3. Morbid obesity weight loss needed 4. Essential hypertension on Coreg 5. History of multi-nodule goiter 6. Small pulmonary nodule seen on CT scan. Repeat CAT scan in 6 months time recommended 7. Adrenal nodule. Follow-up as  outpatient  DISCHARGE CONDITIONS:   Satisfactory  CONSULTS OBTAINED:   none  DRUG ALLERGIES:   Allergies  Allergen Reactions  . Aspirin Hives    DISCHARGE MEDICATIONS:   Current Discharge Medication List    START taking these medications   Details  apixaban (ELIQUIS) 5 MG TABS tablet 2 tablets twice a day for one week then one tablet twice a day for six months Qty: 74 tablet, Refills: 0    vitamin B-12 1000 MCG tablet Take 1 tablet (1,000 mcg total) by mouth daily. Qty: 30 tablet, Refills: 0      CONTINUE these medications which have NOT CHANGED   Details  albuterol (PROVENTIL HFA;VENTOLIN HFA) 108 (90 Base) MCG/ACT inhaler Inhale 2 puffs into the lungs every 6 (six) hours as needed for wheezing or shortness of breath. Qty: 1 Inhaler, Refills: 0    carvedilol (COREG) 6.25 MG tablet Take 1 tablet (6.25 mg total) by mouth 2 (two) times daily with a meal. Qty: 180 tablet, Refills: 0   Associated Diagnoses: Benign hypertension    ferrous sulfate 325 (65 FE) MG tablet Take 1 tablet (325 mg total) by mouth 2 (two) times daily with a meal. Qty: 180 tablet, Refills: 1   Associated Diagnoses: Anemia, iron deficiency      STOP taking these medications     Norgestimate-Ethinyl Estradiol Triphasic (ORTHO TRI-CYCLEN LO) 0.18/0.215/0.25 MG-25 MCG tab          DISCHARGE INSTRUCTIONS:   Follow-up PMD one week Follow-up oncology 2 weeks  If you experience worsening of your admission symptoms,  develop shortness of breath, life threatening emergency, suicidal or homicidal thoughts you must seek medical attention immediately by calling 911 or calling your MD immediately  if symptoms less severe.  You Must read complete instructions/literature along with all the possible adverse reactions/side effects for all the Medicines you take and that have been prescribed to you. Take any new Medicines after you have completely understood and accept all the possible adverse reactions/side  effects.   Please note  You were cared for by a hospitalist during your hospital stay. If you have any questions about your discharge medications or the care you received while you were in the hospital after you are discharged, you can call the unit and asked to speak with the hospitalist on call if the hospitalist that took care of you is not available. Once you are discharged, your primary care physician will handle any further medical issues. Please note that NO REFILLS for any discharge medications will be authorized once you are discharged, as it is imperative that you return to your primary care physician (or establish a relationship with a primary care physician if you do not have one) for your aftercare needs so that they can reassess your need for medications and monitor your lab values.    Today   CHIEF COMPLAINT:   Chief Complaint  Patient presents with  . Shortness of Breath    HISTORY OF PRESENT ILLNESS:  Madeline Dominguez  is a 50 y.o. female presented with shortness of breath and found to have pulmonary embolism and DVT   VITAL SIGNS:  Blood pressure (!) 151/83, pulse 74, temperature 98.1 F (36.7 C), temperature source Oral, resp. rate 18, height 5\' 7"  (1.702 m), weight (!) 138.4 kg (305 lb 1.6 oz), last menstrual period 07/25/2015, SpO2 96 %.    PHYSICAL EXAMINATION:  GENERAL:  50 y.o.-year-old patient lying in the bed with no acute distress.  EYES: Pupils equal, round, reactive to light and accommodation. No scleral icterus. Extraocular muscles intact.  HEENT: Head atraumatic, normocephalic. Oropharynx and nasopharynx clear.  NECK:  Supple, no jugular venous distention. No thyroid enlargement, no tenderness.  LUNGS: Normal breath sounds bilaterally, no wheezing, rales,rhonchi or crepitation. No use of accessory muscles of respiration.  CARDIOVASCULAR: S1, S2 normal. No murmurs, rubs, or gallops.  ABDOMEN: Soft, non-tender, non-distended. Bowel sounds present. No  organomegaly or mass.  EXTREMITIES: 2+ edema, no cyanosis, or clubbing.  NEUROLOGIC: Cranial nerves II through XII are intact. Muscle strength 5/5 in all extremities. Sensation intact. Gait not checked.  PSYCHIATRIC: The patient is alert and oriented x 3.  SKIN: No obvious rash, lesion, or ulcer.   DATA REVIEW:   CBC  Recent Labs Lab 07/27/15 0239  WBC 6.7  HGB 7.9*  HCT 26.6*  PLT 341    Chemistries   Recent Labs Lab 07/26/15 0412  NA 139  K 3.5  CL 103  CO2 29  GLUCOSE 115*  BUN 9  CREATININE 0.61  CALCIUM 8.3*      RADIOLOGY:  Ct Angio Chest Pe W Or Wo Contrast  Addendum Date: 07/25/2015   ADDENDUM REPORT: 07/25/2015 17:35 ADDENDUM: Critical Value/emergent results were called by me at the time of interpretation on 07/25/2015 at 5:33 pm to Dr. Rosanna Randy, covering for Dr. Steele Sizer , who verbally acknowledged these results. Given that this exam was completed after normal office hours, I personally discussed the results of the exam with the patient and her husband and and let them know that I had  talked to Dr. Rosanna Randy. I instructed them to go immediately to the emergency department, given the clinical significance of pulmonary embolus. The patient voiced understanding of the situation and agree to proceed directly to the [facility] emergency room. I also called Marya Amsler, the charge nurse in the emergency room, to alert him of the patient's impending arrival. Electronically Signed   By: Misty Stanley M.D.   On: 07/25/2015 17:35  Result Date: 07/25/2015 CLINICAL DATA:  Cough and shortness of breath for 1 month. Positive right lower extremity DVT. EXAM: CT ANGIOGRAPHY CHEST WITH CONTRAST TECHNIQUE: Multidetector CT imaging of the chest was performed using the standard protocol during bolus administration of intravenous contrast. Multiplanar CT image reconstructions and MIPs were obtained to evaluate the vascular anatomy. CONTRAST:  100 cc Isovue 370 COMPARISON:  None. FINDINGS:  Cardiovascular: Heart is upper normal in a mildly enlarged. No thoracic aortic aneurysm. Pulmonary embolic disease is seen and lobar and segmental pulmonary arteries to the right lower lobe. There is evidence of pulmonary embolus and segmental left upper lobe pulmonary arteries. Mediastinum/Nodes: No mediastinal lymphadenopathy. 1.9 cm right thyroid nodule noted. There is no hilar lymphadenopathy. There is no axillary lymphadenopathy. The esophagus has normal imaging features. Lungs/Pleura: 5 mm subpleural nodule identified anterior right lung. 3 mm right lung nodule seen on image 40 series 5. Other scattered tiny bilateral pulmonary nodules are evident, measuring 5 mm or less in diameter. There is some atelectasis at the bases. No pulmonary edema or pleural effusion. Upper Abdomen: 2.1 cm left adrenal nodule incompletely visualized. Musculoskeletal: Bone windows reveal no worrisome lytic or sclerotic osseous lesions. Review of the MIP images confirms the above findings. IMPRESSION: 1. Acute bilateral pulmonary embolus. No evidence for right heart strain. 2. Bilateral tiny pulmonary nodules. No follow-up needed if patient is low-risk (and has no known or suspected primary neoplasm). Non-contrast chest CT can be considered in 12 months if patient is high-risk. This recommendation follows the consensus statement: Guidelines for Management of Incidental Pulmonary Nodules Detected on CT Images:From the Fleischner Society 2017; published online before print (10.1148/radiol.IJ:2314499). 3. 2.1 cm left adrenal nodule has been incompletely visualized. CT imaging of the abdomen before and after contrast could be performed for further evaluation. Electronically Signed: By: Misty Stanley M.D. On: 07/25/2015 17:15  US Venous Img Lower Unilateral Right  Result Date: 07/25/2015 CLINICAL DATA:  50 year old female with a history of right leg swelling EXAM: RIGHT LOWER EXTREMITY VENOUS DOPPLER ULTRASOUND TECHNIQUE: Gray-scale  sonography with graded compression, as well as color Doppler and duplex ultrasound were performed to evaluate the lower extremity deep venous systems from the level of the common femoral vein and including the common femoral, femoral, profunda femoral, popliteal and calf veins including the posterior tibial, peroneal and gastrocnemius veins when visible. The superficial great saphenous vein was also interrogated. Spectral Doppler was utilized to evaluate flow at rest and with distal augmentation maneuvers in the common femoral, femoral and popliteal veins. COMPARISON:  None. FINDINGS: Contralateral Common Femoral Vein: Respiratory phasicity is normal and symmetric with the symptomatic side. No evidence of thrombus. Normal compressibility. Common Femoral Vein: Nonocclusive thrombus extending from the femoral vein. Respiratory phasicity and compressibility maintained. Saphenofemoral Junction: Nonocclusive thrombus at the common femoral vein extending up from the femoral vein. Profunda Femoral Vein: Partially occlusive thrombus involving the profunda femoris vein at the confluence. Femoral Vein: Occlusive thrombus of the femoral vein extending as far proximally as into the common femoral vein, and distally through the popliteal vein. Popliteal Vein:  Nonocclusive thrombus of the popliteal vein extending into the femoral vein without involvement of the tibial veins. Calf Veins: Peroneal vein not visualized. No occlusive thrombus of the posterior tibial vein. Superficial Great Saphenous Vein: No evidence of thrombus. Normal compressibility and flow on color Doppler imaging. Other Findings:  None. IMPRESSION: Sonographic survey of the right lower extremity is positive for occlusive DVT of the femoral vein extending distally to involve the popliteal vein, and proximally into the common femoral vein. DVT of the common femoral vein is not occlusive and does not appear to extend into the pelvis. The technologist performing the  study has called the preliminary report to the ordering physician. Signed, Dulcy Fanny. Earleen Newport, DO [facility] Electronically Signed   By: Corrie Mckusick D.O.   On: 07/25/2015 16:18  US Venous Img Upper Uni Left  Result Date: 07/27/2015 CLINICAL DATA:  Left upper extremity pain and swelling. EXAM: LEFT UPPER EXTREMITY VENOUS DOPPLER ULTRASOUND TECHNIQUE: Gray-scale sonography with graded compression, as well as color Doppler and duplex ultrasound were performed to evaluate the upper extremity deep venous system from the level of the subclavian vein and including the jugular, axillary, basilic, radial, ulnar and upper cephalic vein. Spectral Doppler was utilized to evaluate flow at rest and with distal augmentation maneuvers. COMPARISON:  CT 07/25/2015. FINDINGS: Contralateral Subclavian Vein: Respiratory phasicity is normal and symmetric with the symptomatic side. No evidence of thrombus. Normal compressibility. Internal Jugular Vein: No evidence of thrombus. Normal compressibility, respiratory phasicity and response to augmentation. Subclavian Vein: No evidence of thrombus. Normal compressibility, respiratory phasicity and response to augmentation. Axillary Vein: No evidence of thrombus. Normal compressibility, respiratory phasicity and response to augmentation. Cephalic Vein: No evidence of thrombus. Normal compressibility, respiratory phasicity and response to augmentation. Basilic Vein: No evidence of thrombus. Normal compressibility, respiratory phasicity and response to augmentation. Brachial Veins: No evidence of thrombus. Normal compressibility, respiratory phasicity and response to augmentation. Radial Veins: No evidence of thrombus. Normal compressibility, respiratory phasicity and response to augmentation. Ulnar Veins: No evidence of thrombus. Normal compressibility, respiratory phasicity and response to augmentation. Other Findings:  None  visualized. IMPRESSION: No evidence of deep venous thrombosis. Electronically Signed   By: Marcello Moores  Register   On: 07/27/2015 10:59   Management plans discussed with the patient, family and they are in agreement.  CODE STATUS:     Code Status Orders        Start     Ordered   07/25/15 2028  Full code  Continuous     07/25/15 2027    Code Status History    Date Active Date Inactive Code Status Order ID Comments User Context   07/25/2015  8:27 PM 07/26/2015 12:10 PM Full Code [id number]  Gladstone Lighter, MD Inpatient      TOTAL TIME TAKING CARE OF THIS PATIENT: 35 minutes.    Loletha Grayer M.D on 07/27/2015 at 3:18 PM  Between 7am to 6pm - Pager - [phone]  After 6pm go to [email or web address] - password [email or web address]  [employer] [facility] Office  [phone]  CC: Primary care physician; Loistine Chance, MD

## 2015-07-27 NOTE — Discharge Instructions (Signed)
Stop eating ICE!  Deep Vein Thrombosis A deep vein thrombosis (DVT) is a blood clot (thrombus) that usually occurs in a deep, larger vein of the lower leg or the pelvis, or in an upper extremity such as the arm. These are dangerous and can lead to serious and even life-threatening complications if the clot travels to the lungs. A DVT can damage the valves in your leg veins so that instead of flowing upward, the blood pools in the lower leg. This is called post-thrombotic syndrome, and it can result in pain, swelling, discoloration, and sores on the leg. CAUSES A DVT is caused by the formation of a blood clot in your leg, pelvis, or arm. Usually, several things contribute to the formation of blood clots. A clot may develop when:  Your blood flow slows down.  Your vein becomes damaged in some way.  You have a condition that makes your blood clot more easily. RISK FACTORS A DVT is more likely to develop in:  People who are older, especially over 54 years of age.  People who are overweight (obese).  People who sit or lie still for a long time, such as during long-distance travel (over 4 hours), bed rest, hospitalization, or during recovery from certain medical conditions like a stroke.  People who do not engage in much physical activity (sedentary lifestyle).  People who have chronic breathing disorders.  People who have a personal or family history of blood clots or blood clotting disease.  People who have peripheral vascular disease (PVD), diabetes, or some types of cancer.  People who have heart disease, especially if the person had a recent heart attack or has congestive heart failure.  People who have neurological diseases that affect the legs (leg paresis).  People who have had a traumatic injury, such as breaking a hip or leg.  People who have recently had major or lengthy surgery, especially on the hip, knee, or abdomen.  People who have had a central line placed inside a  large vein.  People who take medicines that contain the hormone estrogen. These include birth control pills and hormone replacement therapy.  Pregnancy or during childbirth or the postpartum period.  Long plane flights (over 8 hours). SIGNS AND SYMPTOMS Symptoms of a DVT can include:   Swelling of your leg or arm, especially if one side is much worse.  Warmth and redness of your leg or arm, especially if one side is much worse.  Pain in your arm or leg. If the clot is in your leg, symptoms may be more noticeable or worse when you stand or walk.  A feeling of pins and needles, if the clot is in the arm. The symptoms of a DVT that has traveled to the lungs (pulmonary embolism, PE) usually start suddenly and include:  Shortness of breath while active or at rest.  Coughing or coughing up blood or blood-tinged mucus.  Chest pain that is often worse with deep breaths.  Rapid or irregular heartbeat.  Feeling light-headed or dizzy.  Fainting.  Feeling anxious.  Sweating. There may also be pain and swelling in a leg if that is where the blood clot started. These symptoms may represent a serious problem that is an emergency. Do not wait to see if the symptoms will go away. Get medical help right away. Call your local emergency services (911 in the [place] Do not drive yourself to the hospital. DIAGNOSIS Your health care provider will take a medical history and perform a physical  exam. You may also have other tests, including:  Blood tests to assess the clotting properties of your blood.  Imaging tests, such as CT, ultrasound, MRI, X-ray, and other tests to see if you have clots anywhere in your body. TREATMENT After a DVT is identified, it can be treated. The type of treatment that you receive depends on many factors, such as the cause of your DVT, your risk for bleeding or developing more clots, and other medical conditions that you have. Sometimes, a combination of treatments is  necessary. Treatment options may be combined and include:  Monitoring the blood clot with ultrasound.  Taking medicines by mouth, such as newer blood thinners (anticoagulants), thrombolytics, or warfarin.  Taking anticoagulant medicine by injection or through an IV tube.  Wearing compression stockings or using different types ofdevices.  Surgery (rare) to remove the blood clot or to place a filter in your abdomen to stop the blood clot from traveling to your lungs. Treatments for a DVT are often divided into immediate treatment and long-term treatment (up to 3 months after DVT). You can work with your health care provider to choose the treatment program that is best for you. HOME CARE INSTRUCTIONS If you are taking a newer oral anticoagulant:  Take the medicine every single day at the same time each day.  Understand what foods and drugs interact with this medicine.  Understand that there are no regular blood tests required when using this medicine.  Understand the side effects of this medicine, including excessive bruising or bleeding. Ask your health care provider or pharmacist about other possible side effects. If you are taking warfarin:  Understand how to take warfarin and know which foods can affect how warfarin works in Veterinary surgeon.  Understand that it is dangerous to take too much or too little warfarin. Too much warfarin increases the risk of bleeding. Too little warfarin continues to allow the risk for blood clots.  Follow your PT and INR blood testing schedule. The PT and INR results allow your health care provider to adjust your dose of warfarin. It is very important that you have your PT and INR tested as often as told by your health care provider.  Avoid major changes in your diet, or tell your health care provider before you change your diet. Arrange a visit with a registered dietitian to answer your questions. Many foods, especially foods that are high in vitamin K, can  interfere with warfarin and affect the PT and INR results. Eat a consistent amount of foods that are high in vitamin K, such as:  Spinach, kale, broccoli, cabbage, collard greens, turnip greens, Brussels sprouts, peas, cauliflower, seaweed, and parsley.  Beef liver and pork liver.  Green tea.  Soybean oil.  Tell your health care provider about any and all medicines, vitamins, and supplements that you take, including aspirin and other over-the-counter anti-inflammatory medicines. Be especially cautious with aspirin and anti-inflammatory medicines. Do not take those before you ask your health care provider if it is safe to do so. This is important because many medicines can interfere with warfarin and affect the PT and INR results.  Do not start or stop taking any over-the-counter or prescription medicine unless your health care provider or pharmacist tells you to do so. If you take warfarin, you will also need to do these things:  Hold pressure over cuts for longer than usual.  Tell your dentist and other health care providers that you are taking warfarin before you have  any procedures in which bleeding may occur.  Avoid alcohol or drink very small amounts. Tell your health care provider if you change your alcohol intake.  Do not use tobacco products, including cigarettes, chewing tobacco, and e-cigarettes. If you need help quitting, ask your health care provider.  Avoid contact sports. General Instructions  Take over-the-counter and prescription medicines only as told by your health care provider. Anticoagulant medicines can have side effects, including easy bruising and difficulty stopping bleeding. If you are prescribed an anticoagulant, you will also need to do these things:  Hold pressure over cuts for longer than usual.  Tell your dentist and other health care providers that you are taking anticoagulants before you have any procedures in which bleeding may occur.  Avoid contact  sports.  Wear a medical alert bracelet or carry a medical alert card that says you have had a PE.  Ask your health care provider how soon you can go back to your normal activities. Stay active to prevent new blood clots from forming.  Make sure to exercise while traveling or when you have been sitting or standing for a long period of time. It is very important to exercise. Exercise your legs by walking or by tightening and relaxing your leg muscles often. Take frequent walks.  Wear compression stockings as told by your health care provider to help prevent more blood clots from forming.  Do not use tobacco products, including cigarettes, chewing tobacco, and e-cigarettes. If you need help quitting, ask your health care provider.  Keep all follow-up appointments with your health care provider. This is important. PREVENTION Take these actions to decrease your risk of developing another DVT:  Exercise regularly. For at least 30 minutes every day, engage in:  Activity that involves moving your arms and legs.  Activity that encourages good blood flow through your body by increasing your heart rate.  Exercise your arms and legs every hour during long-distance travel (over 4 hours). Drink plenty of water and avoid drinking alcohol while traveling.  Avoid sitting or lying in bed for long periods of time without moving your legs.  Maintain a weight that is appropriate for your height. Ask your health care provider what weight is healthy for you.  If you are a woman who is over 20 years of age, avoid unnecessary use of medicines that contain estrogen. These include birth control pills.  Do not smoke, especially if you take estrogen medicines. If you need help quitting, ask your health care provider. If you are hospitalized, prevention measures may include:  Early walking after surgery, as soon as your health care provider says that it is safe.  Receiving anticoagulants to prevent blood  clots.If you cannot take anticoagulants, other options may be available, such as wearing compression stockings or using different types of devices. SEEK IMMEDIATE MEDICAL CARE IF:  You have new or increased pain, swelling, or redness in an arm or leg.  You have numbness or tingling in an arm or leg.  You have shortness of breath while active or at rest.  You have chest pain.  You have a rapid or irregular heartbeat.  You feel light-headed or dizzy.  You cough up blood.  You notice blood in your vomit, bowel movement, or urine. These symptoms may represent a serious problem that is an emergency. Do not wait to see if the symptoms will go away. Get medical help right away. Call your local emergency services (911 in the [place] Do not drive yourself  to the hospital.   This information is not intended to replace advice given to you by your health care provider. Make sure you discuss any questions you have with your health care provider.   Document Released: 12/18/2004 Document Revised: 09/08/2014 Document Reviewed: 04/14/2014 Elsevier Interactive Patient Education 2016 [employer]   Pulmonary Embolism A pulmonary embolism (PE) is a sudden blockage or decrease of blood flow in one lung or both lungs. Most blockages come from a blood clot that travels from the legs or the pelvis to the lungs. PE is a dangerous and potentially life-threatening condition if it is not treated right away. CAUSES A pulmonary embolism occurs most commonly when a blood clot travels from one of your veins to your lungs. Rarely, PE is caused by air, fat, amniotic fluid, or part of a tumor traveling through your veins to your lungs. RISK FACTORS A PE is more likely to develop in:  People who smoke.  People who areolder, especially over 46 years of age.  People who are overweight (obese).  People who sit or lie still for a long time, such as during long-distance travel (over 4 hours), bed rest,  hospitalization, or during recovery from certain medical conditions like a stroke.  People who do not engage in much physical activity (sedentary lifestyle).  People who have chronic breathing disorders.  People whohave a personal or family history of blood clots or blood clotting disease.  People whohave peripheral vascular disease (PVD), diabetes, or some types of cancer.  People who haveheart disease, especially if the person had a recent heart attack or has congestive heart failure.  People who have neurological diseases that affect the legs (leg paresis).  People who have had a traumatic injury, such as breaking a hip or leg.  People whohave recently had major or lengthy surgery, especially on the hip, knee, or abdomen.  People who have hada central line placed inside a large vein.  People who takemedicines that contain the hormone estrogen. These include birth control pills and hormone replacement therapy.  Pregnancy or during childbirth or the postpartum period. SIGNS AND SYMPTOMS  The symptoms of a PE usually start suddenly and include:  Shortness of breath while active or at rest.  Coughing or coughing up blood or blood-tinged mucus.  Chest pain that is often worse with deep breaths.  Rapid or irregular heartbeat.  Feeling light-headed or dizzy.  Fainting.  Feelinganxious.  Sweating. There may also be pain and swelling in a leg if that is where the blood clot started. These symptoms may represent a serious problem that is an emergency. Do not wait to see if the symptoms will go away. Get medical help right away. Call your local emergency services (911 in the [place] Do not drive yourself to the hospital. DIAGNOSIS Your health care provider will take a medical history and perform a physical exam. You may also have other tests, including:  Blood tests to assess the clotting properties of your blood, assess oxygen levels in your blood, and find blood  clots.  Imaging tests, such as CT, ultrasound, MRI, X-ray, and other tests to see if you have clots anywhere in your body.  An electrocardiogram (ECG) to look for heart strain from blood clots in the lungs. TREATMENT The main goals of PE treatment are:  To stop a blood clot from growing larger.  To stop new blood clots from forming. The type of treatment that you receive depends on many factors, such as the cause  of your PE, your risk for bleeding or developing more clots, and other medical conditions that you have. Sometimes, a combination of treatments is necessary. This condition may be treated with:  Medicines, including newer oral blood thinners (anticoagulants), warfarin, low molecular weight heparins, thrombolytics, or heparins.  Wearing compression stockings or using different types of devices.  Surgery (rare) to remove the blood clot or to place a filter in your abdomen to stop the blood clot from traveling to your lungs. Treatments for a PE are often divided into immediate treatment, long-term treatment (up to 3 months after PE), and extended treatment (more than 3 months after PE). Your treatment may continue for several months. This is called maintenance therapy, and it is used to prevent the forming of new blood clots. You can work with your health care provider to choose the treatment program that is best for you. What are anticoagulants? Anticoagulants are medicines that treat PEs. They can stop current blood clots from growing and stop new clots from forming. They cannot dissolve existing clots. Your body dissolves clots by itself over time. Anticoagulants are given by mouth, by injection, or through an IV tube. What are thrombolytics? Thrombolytics are clot-dissolving medicines that are used to dissolve a PE. They carry a high risk of bleeding, so they tend to be used only in severe cases or if you have very low blood pressure. HOME CARE INSTRUCTIONS If you are taking a  newer oral anticoagulant:  Take the medicine every single day at the same time each day.  Understand what foods and drugs interact with this medicine.  Understand that there are no regular blood tests required when using this medicine.  Understandthe side effects of this medicine, including excessive bruising or bleeding. Ask your health care provider or pharmacist about other possible side effects. If you are taking warfarin:  Understand how to take warfarin and know which foods can affect how warfarin works in Veterinary surgeon.  Understand that it is dangerous to taketoo much or too little warfarin. Too much warfarin increases the risk of bleeding. Too little warfarin continues to allow the risk for blood clots.  Follow your PT and INR blood testing schedule. The PT and INR results allow your health care provider to adjust your dose of warfarin. It is very important that you have your PT and INR tested as often as told by your health care provider.  Avoid major changes in your diet, or tell your health care provider before you change your diet. Arrange a visit with a registered dietitian to answer your questions. Many foods, especially foods that are high in vitamin K, can interfere with warfarin and affect the PT and INR results. Eat a consistent amount of foods that are high in vitamin K, such as:  Spinach, kale, broccoli, cabbage, collard greens, turnip greens, Brussels sprouts, peas, cauliflower, seaweed, and parsley.  Beef liver and pork liver.  Green tea.  Soybean oil.  Tell your health care provider about any and all medicines, vitamins, and supplements that you take, including aspirin and other over-the-counter anti-inflammatory medicines. Be especially cautious with aspirin and anti-inflammatory medicines. Do not take those before you ask your health care provider if it is safe to do so. This is important because many medicines can interfere with warfarin and affect the PT and INR  results.  Do not start or stop taking any over-the-counter or prescription medicine unless your health care provider or pharmacist tells you to do so. If you take  warfarin, you will also need to do these things:  Hold pressure over cuts for longer than usual.  Tell your dentist and other health care providers that you are taking warfarin before you have any procedures in which bleeding may occur.  Avoid alcohol or drink very small amounts. Tell your health care provider if you change your alcohol intake.  Do not use tobacco products, including cigarettes, chewing tobacco, and e-cigarettes. If you need help quitting, ask your health care provider.  Avoid contact sports. General Instructions  Take over-the-counter and prescription medicines only as told by your health care provider. Anticoagulant medicines can have side effects, including easy bruising and difficulty stopping bleeding. If you are prescribed an anticoagulant, you will also need to do these things:  Hold pressure over cuts for longer than usual.  Tell your dentist and other health care providers that you are taking anticoagulants before you have any procedures in which bleeding may occur.  Avoid contact sports.  Wear a medical alert bracelet or carry a medical alert card that says you have had a PE.  Ask your health care provider how soon you can go back to your normal activities. Stay active to prevent new blood clots from forming.  Make sure to exercise while traveling or when you have been sitting or standing for a long period of time. It is very important to exercise. Exercise your legs by walking or by tightening and relaxing your leg muscles often. Take frequent walks.  Wear compression stockings as told by your health care provider to help prevent more blood clots from forming.  Do not use tobacco products, including cigarettes, chewing tobacco, and e-cigarettes. If you need help quitting, ask your health care  provider.  Keep all follow-up appointments with your health care provider. This is important. PREVENTION Take these actions to decrease your risk of developing another PE:  Exercise regularly. For at least 30 minutes every day, engage in:  Activity that involves moving your arms and legs.  Activity that encourages good blood flow through your body by increasing your heart rate.  Exercise your arms and legs every hour during long-distance travel (over 4 hours). Drink plenty of water and avoid drinking alcohol while traveling.  Avoid sitting or lying in bed for long periods of time without moving your legs.  Maintain a weight that is appropriate for your height. Ask your health care provider what weight is healthy for you.  If you are a woman who is over 73 years of age, avoid unnecessary use of medicines that contain estrogen. These include birth control pills.  Do not smoke, especially if you take estrogen medicines. If you need help quitting, ask your health care provider.  If you are at very high risk for PE, wear compression stockings.  If you recently had a PE, have regularly scheduled ultrasound testing on your legs to check for new blood clots. If you are hospitalized, prevention measures may include:  Early walking after surgery, as soon as your health care provider says that it is safe.  Receiving anticoagulants to prevent blood clots. If you cannot take anticoagulants, other options may be available, such as wearing compression stockings or using different types of devices. SEEK IMMEDIATE MEDICAL CARE IF:  You have new or increased pain, swelling, or redness in an arm or leg.  You have numbness or tingling in an arm or leg.  You have shortness of breath while active or at rest.  You have chest pain.  You have a rapid or irregular heartbeat.  You feel light-headed or dizzy.  You cough up blood.  You notice blood in your vomit, bowel movement, or urine.  You  have a fever. These symptoms may represent a serious problem that is an emergency. Do not wait to see if the symptoms will go away. Get medical help right away. Call your local emergency services (911 in the [place] Do not drive yourself to the hospital.   This information is not intended to replace advice given to you by your health care provider. Make sure you discuss any questions you have with your health care provider.   Document Released: 12/16/1999 Document Revised: 09/08/2014 Document Reviewed: 04/14/2014 [employer] Interactive Patient Education [employer].

## 2015-07-27 NOTE — Progress Notes (Signed)
ANTICOAGULATION CONSULT NOTE - Follow up [facility] for heparin drip Indication: pulmonary embolus  Allergies  Allergen Reactions  . Aspirin Hives    Patient Measurements: Height: 5\' 7"  (170.2 cm) Weight: (!) 305 lb 1.6 oz (138.4 kg) IBW/kg (Calculated) : 61.6 Heparin Dosing Weight: 101 kg  Vital Signs: Temp: 98.5 F (36.9 C) (07/25 2355) Temp Source: Oral (07/25 2355) BP: 131/67 (07/25 2355) Pulse Rate: 88 (07/25 2355)  Labs:  Recent Labs  07/25/15 1734  07/26/15 0412 07/26/15 1243 07/26/15 1839 07/27/15 0239  HGB 8.1*  --  7.9*  --   --  7.9*  HCT 27.1*  --  26.1*  --   --  26.6*  PLT 339  --  331  --   --  341  APTT 24  --   --  37* 33 37*  LABPROT 13.5  --   --   --   --   --   INR 1.01  --   --   --   --   --   HEPARINUNFRC  --   < >  --  0.60 0.24* 0.23*  CREATININE 0.80  --  0.61  --   --   --   < > = values in this interval not displayed.  Estimated Creatinine Clearance: 125.3 mL/min (by C-G formula based on SCr of 0.8 mg/dL).   Medical History: Past Medical History:  Diagnosis Date  . Cough 07/25/2015  . Dilated cardiomyopathy ([place])   . H/O: hypothyroidism   . Heart palpitations   . Hypertension   . Iron deficiency   . Large breasts   . Morbid obesity ([place])   . Postpartum hemorrhage   . Thyroid disease     Assessment: Pharmacy consulted to dose and monitor heparin drip in this 50 year old female who was diagnosed with bilateral PE today. Patient was not taking anticoagulants prior to admission but did receive one dose of rivaroxaban in the ED @ 1830 this evening. Spoke with MD - heparin drip will begin at 0630 tomorrow morning.   Baseline labs ordered  Goal of Therapy:  Heparin level 0.3-0.7 units/ml aPTT 66-102 seconds Monitor platelets by anticoagulation protocol: Yes   Plan:  Give 3000 units bolus x 1 Start heparin infusion at 1700 units/hr Check anti-Xa level in 6 hours and daily while on heparin Continue to monitor  H&H and platelets   7/25 Heparin level at 1243= 0.6.  Will continue current Heparin rate of 1700 units/hr. Will check confirmatory level in 6 hrs.  7/24 1839 HL: 0.24 - Will give heparin bolus 1500 units, increase rate to 1900 units/hr  Patient received rivaroxaban x 1 on 07/24, previous heparin level did not correlated with aPTT (HL therapeutic, aPTT subtherapeutic).   Will check both in 6 hours. If levels do not correlate, consider dosing based on aPTT until levels match.    7/26 02:30 heparin level 0.23, aPTT 37. 3000 unit bolus and increase drip rate to 2000 units/hr. Recheck in 6 hours.   Eloise Harman, PharmD Clinical Pharmacist 07/27/2015,4:20 AM

## 2015-07-27 NOTE — Progress Notes (Signed)
ANTICOAGULATION CONSULT NOTE - Follow up [facility] for Apixaban Indication: pulmonary embolus  Allergies  Allergen Reactions  . Aspirin Hives    Patient Measurements: Height: 5\' 7"  (170.2 cm) Weight: (!) 305 lb 1.6 oz (138.4 kg) IBW/kg (Calculated) : 61.6 Heparin Dosing Weight: 101 kg  Vital Signs: Temp: 98 F (36.7 C) (07/26 0750) Temp Source: Oral (07/26 0750) BP: 151/74 (07/26 0750) Pulse Rate: 81 (07/26 0750)  Labs:  Recent Labs  07/25/15 1734  07/26/15 0412 07/26/15 1243 07/26/15 1839 07/27/15 0239  HGB 8.1*  --  7.9*  --   --  7.9*  HCT 27.1*  --  26.1*  --   --  26.6*  PLT 339  --  331  --   --  341  APTT 24  --   --  37* 33 37*  LABPROT 13.5  --   --   --   --   --   INR 1.01  --   --   --   --   --   HEPARINUNFRC  --   < >  --  0.60 0.24* 0.23*  CREATININE 0.80  --  0.61  --   --   --   < > = values in this interval not displayed.  Estimated Creatinine Clearance: 125.3 mL/min (by C-G formula based on SCr of 0.8 mg/dL).   Medical History: Past Medical History:  Diagnosis Date  . Cough 07/25/2015  . Dilated cardiomyopathy ([place])   . H/O: hypothyroidism   . Heart palpitations   . Hypertension   . Iron deficiency   . Large breasts   . Morbid obesity ([place])   . Postpartum hemorrhage   . Thyroid disease     Assessment: Pharmacy consulted to dose apixaban patient with bilateral PE (and + DVT). Patient was not taking anticoagulants prior to admission. Patient currently on Heparin drip. Discontinue Heparin drip when 1st dose of Apixaban given. Called RN to inform.  Baseline labs ordered  Goal of Therapy:  Monitor platelets by anticoagulation protocol: Yes   Plan:  Start Apixaban 10 mg po BID x 7 days then 5 mg po bid. Discontinue Heparin drip at start of apixaban.   Noralee Space, PharmD Clinical Pharmacist 07/27/2015,9:15 AM

## 2015-07-28 LAB — CARDIOLIPIN ANTIBODIES, IGG, IGM, IGA: Anticardiolipin IgG: <9 GPL U/mL (ref 0–14)

## 2015-07-29 LAB — PROTEIN C, TOTAL: Protein C, Total: 104 % (ref 60–150)

## 2015-08-01 ENCOUNTER — Ambulatory Visit (INDEPENDENT_AMBULATORY_CARE_PROVIDER_SITE_OTHER): Payer: 59 | Admitting: Family Medicine

## 2015-08-01 ENCOUNTER — Encounter: Payer: Self-pay | Admitting: Family Medicine

## 2015-08-01 VITALS — BP 138/82 | HR 88 | Resp 18 | Wt 339.2 lb

## 2015-08-01 DIAGNOSIS — I10 Essential (primary) hypertension: Secondary | ICD-10-CM | POA: Diagnosis not present

## 2015-08-01 DIAGNOSIS — I2699 Other pulmonary embolism without acute cor pulmonale: Secondary | ICD-10-CM | POA: Diagnosis not present

## 2015-08-01 DIAGNOSIS — R918 Other nonspecific abnormal finding of lung field: Secondary | ICD-10-CM

## 2015-08-01 DIAGNOSIS — E279 Disorder of adrenal gland, unspecified: Secondary | ICD-10-CM

## 2015-08-01 DIAGNOSIS — I82401 Acute embolism and thrombosis of unspecified deep veins of right lower extremity: Secondary | ICD-10-CM | POA: Insufficient documentation

## 2015-08-01 DIAGNOSIS — Z09 Encounter for follow-up examination after completed treatment for conditions other than malignant neoplasm: Secondary | ICD-10-CM

## 2015-08-01 DIAGNOSIS — Z30018 Encounter for initial prescription of other contraceptives: Secondary | ICD-10-CM | POA: Diagnosis not present

## 2015-08-01 DIAGNOSIS — E278 Other specified disorders of adrenal gland: Secondary | ICD-10-CM

## 2015-08-01 LAB — PROTHROMBIN GENE MUTATION

## 2015-08-01 LAB — FACTOR 5 LEIDEN

## 2015-08-01 MED ORDER — CARVEDILOL 6.25 MG PO TABS: 6.2500 mg | ORAL_TABLET | Freq: Two times a day (BID) | ORAL | 0 refills | Status: DC

## 2015-08-01 NOTE — Progress Notes (Signed)
Name: Madeline Dominguez   MRN: [id number]    DOB: 13-Jan-1965   Date:08/01/2015       Progress Note  Subjective  Chief Complaint  Chief Complaint  Patient presents with  . Hospitalization Follow-up    for a PE    HPI  Hospital follow up: patient was seen as an outpatient on 07/24 with complaints of right calf pain. Doppler US showed DVT, CT chest showed PE, pulmonary nodules and left adrenal gland nodule. She was admitted that day to [facility]. So far coagulation profile negative. Echo showed no heart strain. She was seen by Dr. Grayland Ormond because of severe anemia and had one iron infusion at the hospital and will follow up on Wednesday for another infusion. She was started on Heparin drip at the hospital followed by Eliquis 10 mg twice daily and this week will go down to 5 mg twice daily. Currently on her cycle . She is sexually active and has been off ocp, since diagnosed with DVT/PE, needs another form of contraception. So far no side effects of medication. BP is at goal. Right leg pain has decreased. 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    Patient Active Problem List   Diagnosis Date Noted  . Right leg DVT ([place]) 08/01/2015  . Iron deficiency anemia 07/27/2015  . B12 deficiency 07/27/2015  . Pulmonary embolism ([place]) 07/25/2015  . Adrenal nodule ([place]) 07/25/2015  . Pulmonary nodules/lesions, multiple 07/25/2015  . OSA (obstructive sleep apnea) 05/18/2015  . Thyroid nodule 12/17/2014  . Heavy menstrual period 09/02/2014  . Gastroesophageal reflux disease without esophagitis 09/02/2014  . Extreme obesity ([place]) 06/22/2014  . Large breasts 06/22/2014  . Anemia, iron deficiency 06/22/2014  . Benign hypertension 06/22/2014  . H/O hyperthyroidism 06/22/2014  . LVH (left ventricular hypertrophy) 06/22/2014  . 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    Past Surgical History:  Procedure Laterality Date  . CESAREAN SECTION       Family History  Problem Relation Age of Onset  . CVA Father     Social History   Social History  . Marital status: Married    Spouse name: N/A  . Number of children: N/A  . Years of education: N/A   Occupational History  . Not on file.   Social History Main Topics  . Smoking status: Never Smoker  . Smokeless tobacco: Never Used  . Alcohol use No     Comment: occasional  . Drug use: No  . Sexual activity: Not Currently   Other Topics Concern  . Not on file   Social History Narrative   Lives at home with husband.     Current Outpatient Prescriptions:  .  albuterol (PROVENTIL HFA;VENTOLIN HFA) 108 (90 Base) MCG/ACT inhaler, Inhale 2 puffs into the lungs every 6 (six) hours as needed for wheezing or shortness of breath., Disp: 1 Inhaler, Rfl: 0 .  apixaban (ELIQUIS) 5 MG TABS tablet, 2 tablets twice a day for one week then one tablet twice a day for six months, Disp: 74 tablet, Rfl: 0 .  carvedilol (COREG) 6.25 MG tablet, Take 1 tablet (6.25 mg total) by mouth 2 (two) times daily with a meal., Disp: 180 tablet, Rfl: 0 .  ferrous sulfate 325 (65 FE) MG tablet, Take 1 tablet (325 mg total) by mouth 2 (two) times daily with a meal., Disp: 180 tablet, Rfl: 1 .  vitamin B-12 1000 MCG  tablet, Take 1 tablet (1,000 mcg total) by mouth daily., Disp: 30 tablet, Rfl: 0  Allergies  Allergen Reactions  . Aspirin Hives     ROS  Ten systems reviewed and is negative except as mentioned in HPI   Objective  Vitals:   08/01/15 0814  BP: 138/82  Pulse: 88  Resp: 18  SpO2: 96%  Weight: (!) 339 lb 4 oz (153.9 kg)    Body mass index is 53.13 kg/m.  Physical Exam  Constitutional: Patient appears well-developed and well-nourished. Obese No distress.  HEENT: head atraumatic, normocephalic, pupils equal and reactive to light,  neck supple, throat within normal limits Cardiovascular: Normal rate, regular rhythm and normal heart sounds.  No murmur heard. No BLE edema. Mild  right calf pain  Pulmonary/Chest: Effort normal and breath sounds normal. No respiratory distress. Abdominal: Soft.  There is no tenderness. Psychiatric: Patient has a normal mood and affect. behavior is normal. Judgment and thought content normal.  Recent Results (from the past 2160 hour(s))  CBC with Differential     Status: Abnormal   Collection Time: 06/17/15 11:13 AM  Result Value Ref Range   WBC 7.7 3.6 - 11.0 K/uL   RBC 3.55 (L) 3.80 - 5.20 MIL/uL   Hemoglobin 8.1 (L) 12.0 - 16.0 g/dL   HCT 26.5 (L) 35.0 - 47.0 %   MCV 74.5 (L) 80.0 - 100.0 fL   MCH 22.7 (L) 26.0 - 34.0 pg   MCHC 30.4 (L) 32.0 - 36.0 g/dL   RDW 18.3 (H) 11.5 - 14.5 %   Platelets 245 150 - 440 K/uL   Neutrophils Relative % 68 %   Neutro Abs 5.3 1.4 - 6.5 K/uL   Lymphocytes Relative 21 %   Lymphs Abs 1.6 1.0 - 3.6 K/uL   Monocytes Relative 7 %   Monocytes Absolute 0.5 0.2 - 0.9 K/uL   Eosinophils Relative 3 %   Eosinophils Absolute 0.2 0 - 0.7 K/uL   Basophils Relative 1 %   Basophils Absolute 0.0 0 - 0.1 K/uL  Brain natriuretic peptide     Status: Abnormal   Collection Time: 06/17/15 11:13 AM  Result Value Ref Range   B Natriuretic Peptide 110.0 (H) 0.0 - 100.0 pg/mL  Basic metabolic panel     Status: Abnormal   Collection Time: 06/17/15 11:13 AM  Result Value Ref Range   Sodium 139 135 - 145 mmol/L   Potassium 3.6 3.5 - 5.1 mmol/L   Chloride 105 101 - 111 mmol/L   CO2 26 22 - 32 mmol/L   Glucose, Bld 109 (H) 65 - 99 mg/dL   BUN 10 6 - 20 mg/dL   Creatinine, Ser 0.67 0.44 - 1.00 mg/dL   Calcium 8.3 (L) 8.9 - 10.3 mg/dL   GFR calc non Af Amer >60 >60 mL/min   GFR calc Af Amer >60 >60 mL/min    Comment: (NOTE) The eGFR has been calculated using the CKD EPI equation. This calculation has not been validated in all clinical situations. eGFR's persistently <60 mL/min signify possible Chronic Kidney Disease.    Anion gap 8 5 - 15  Troponin I     Status: None   Collection Time: 06/17/15 11:13 AM   Result Value Ref Range   Troponin I <0.03 <0.031 ng/mL    Comment:        NO INDICATION OF MYOCARDIAL INJURY.   CBC with Differential/Platelet     Status: Abnormal   Collection Time: 07/25/15 12:18 PM  Result Value Ref Range   WBC 7.6 3.6 - 11.0 K/uL   RBC 3.75 (L) 3.80 - 5.20 MIL/uL   Hemoglobin 7.7 (L) 12.0 - 16.0 g/dL   HCT 26.2 (L) 35.0 - 47.0 %   MCV 69.9 (L) 80.0 - 100.0 fL   MCH 20.7 (L) 26.0 - 34.0 pg   MCHC 29.6 (L) 32.0 - 36.0 g/dL   RDW 19.4 (H) 11.5 - 14.5 %   Platelets 305 150 - 440 K/uL   Neutrophils Relative % 68 %   Neutro Abs 5.1 1.4 - 6.5 K/uL   Lymphocytes Relative 24 %   Lymphs Abs 1.8 1.0 - 3.6 K/uL   Monocytes Relative 6 %   Monocytes Absolute 0.5 0.2 - 0.9 K/uL   Eosinophils Relative 1 %   Eosinophils Absolute 0.1 0 - 0.7 K/uL   Basophils Relative 1 %   Basophils Absolute 0.1 0 - 0.1 K/uL  Fibrin derivatives D-Dimer ([facility] only)     Status: Abnormal   Collection Time: 07/25/15 12:18 PM  Result Value Ref Range   Fibrin derivatives D-dimer ([facility]) 2,185 (H) 0 - 499    Comment: <> Exclusion of Venous Thromboembolism (VTE) - OUTPATIENTS ONLY        (Emergency Department or [place])             0-499 ng/ml (FEU)  : With a low to intermediate pretest                                        probability for VTE this test result                                        excludes the diagnosis of VTE.           > 499 ng/ml (FEU)  : VTE not excluded.  Additional work up                                   for VTE is required.   <>  Testing on Inpatients and Evaluation of Disseminated Intravascular        Coagulation ([facility])             Reference Range:   0-499 ng/ml (FEU)   CBC with Differential     Status: Abnormal   Collection Time: 07/25/15  5:34 PM  Result Value Ref Range   WBC 9.0 3.6 - 11.0 K/uL   RBC 3.84 3.80 - 5.20 MIL/uL   Hemoglobin 8.1 (L) 12.0 - 16.0 g/dL   HCT 27.1 (L) 35.0 - 47.0 %   MCV 70.5 (L) 80.0 - 100.0 fL   MCH 21.0 (L) 26.0 - 34.0 pg    MCHC 29.8 (L) 32.0 - 36.0 g/dL   RDW 19.2 (H) 11.5 - 14.5 %   Platelets 339 150 - 440 K/uL   Neutrophils Relative % 65 %   Neutro Abs 5.8 1.4 - 6.5 K/uL   Lymphocytes Relative 24 %   Lymphs Abs 2.2 1.0 - 3.6 K/uL   Monocytes Relative 8 %   Monocytes Absolute 0.8 0.2 - 0.9 K/uL   Eosinophils Relative 2 %   Eosinophils Absolute 0.2 0 - 0.7 K/uL   Basophils Relative 1 %  Basophils Absolute 0.1 0 - 0.1 K/uL  Basic metabolic panel     Status: Abnormal   Collection Time: 07/25/15  5:34 PM  Result Value Ref Range   Sodium 140 135 - 145 mmol/L   Potassium 3.7 3.5 - 5.1 mmol/L   Chloride 105 101 - 111 mmol/L   CO2 29 22 - 32 mmol/L   Glucose, Bld 98 65 - 99 mg/dL   BUN 9 6 - 20 mg/dL   Creatinine, Ser 0.80 0.44 - 1.00 mg/dL   Calcium 8.2 (L) 8.9 - 10.3 mg/dL   GFR calc non Af Amer >60 >60 mL/min   GFR calc Af Amer >60 >60 mL/min    Comment: (NOTE) The eGFR has been calculated using the CKD EPI equation. This calculation has not been validated in all clinical situations. eGFR's persistently <60 mL/min signify possible Chronic Kidney Disease.    Anion gap 6 5 - 15  Protime-INR     Status: None   Collection Time: 07/25/15  5:34 PM  Result Value Ref Range   Prothrombin Time 13.5 11.4 - 15.0 seconds   INR 1.01   APTT     Status: None   Collection Time: 07/25/15  5:34 PM  Result Value Ref Range   aPTT 24 24 - 36 seconds  Heparin level (unfractionated)     Status: Abnormal   Collection Time: 07/25/15  5:45 PM  Result Value Ref Range   Heparin Unfractionated <0.10 (L) 0.30 - 0.70 IU/mL    Comment:        IF HEPARIN RESULTS ARE BELOW EXPECTED VALUES, AND PATIENT DOSAGE HAS BEEN CONFIRMED, SUGGEST FOLLOW UP TESTING OF ANTITHROMBIN III LEVELS.   Hemoglobin A1c     Status: None   Collection Time: 07/25/15  5:45 PM  Result Value Ref Range   Hgb A1c MFr Bld 5.1 4.0 - 6.0 %  TSH     Status: None   Collection Time: 07/25/15  5:45 PM  Result Value Ref Range   TSH 2.898 0.350 -  4.500 uIU/mL  CBC with Differential/Platelet     Status: Abnormal   Collection Time: 07/26/15  4:12 AM  Result Value Ref Range   WBC 7.2 3.6 - 11.0 K/uL   RBC 3.68 (L) 3.80 - 5.20 MIL/uL   Hemoglobin 7.9 (L) 12.0 - 16.0 g/dL   HCT 26.1 (L) 35.0 - 47.0 %   MCV 70.7 (L) 80.0 - 100.0 fL   MCH 21.6 (L) 26.0 - 34.0 pg   MCHC 30.5 (L) 32.0 - 36.0 g/dL   RDW 19.2 (H) 11.5 - 14.5 %   Platelets 331 150 - 440 K/uL   Neutrophils Relative % 57 %   Neutro Abs 4.1 1.4 - 6.5 K/uL   Lymphocytes Relative 31 %   Lymphs Abs 2.2 1.0 - 3.6 K/uL   Monocytes Relative 9 %   Monocytes Absolute 0.7 0.2 - 0.9 K/uL   Eosinophils Relative 2 %   Eosinophils Absolute 0.2 0 - 0.7 K/uL   Basophils Relative 1 %   Basophils Absolute 0.1 0 - 0.1 K/uL  Basic metabolic panel     Status: Abnormal   Collection Time: 07/26/15  4:12 AM  Result Value Ref Range   Sodium 139 135 - 145 mmol/L   Potassium 3.5 3.5 - 5.1 mmol/L   Chloride 103 101 - 111 mmol/L   CO2 29 22 - 32 mmol/L   Glucose, Bld 115 (H) 65 - 99 mg/dL   BUN 9 6 -  20 mg/dL   Creatinine, Ser 0.61 0.44 - 1.00 mg/dL   Calcium 8.3 (L) 8.9 - 10.3 mg/dL   GFR calc non Af Amer >60 >60 mL/min   GFR calc Af Amer >60 >60 mL/min    Comment: (NOTE) The eGFR has been calculated using the CKD EPI equation. This calculation has not been validated in all clinical situations. eGFR's persistently <60 mL/min signify possible Chronic Kidney Disease.    Anion gap 7 5 - 15  Cardiolipin antibodies, IgG, IgM, IgA     Status: None   Collection Time: 07/26/15  4:12 AM  Result Value Ref Range   Anticardiolipin IgG <9 0 - 14 GPL U/mL    Comment: (NOTE)                          Negative:              <15                          Indeterminate:     15 - 20                          Low-Med Positive: >20 - 80                          High Positive:         >80    Anticardiolipin IgM <9 0 - 12 MPL U/mL    Comment: (NOTE)                          Negative:              <13                           Indeterminate:     13 - 20                          Low-Med Positive: >20 - 80                          High Positive:         >80    Anticardiolipin IgA <9 0 - 11 APL U/mL    Comment: (NOTE)                          Negative:              <12                          Indeterminate:     12 - 20                          Low-Med Positive: >20 - 80                          High Positive:         >80 Performed At: [facility] [address] [place], [place] [id number] Lindon Romp MD [id number]   Antithrombin III     Status: None  Collection Time: 07/26/15  4:12 AM  Result Value Ref Range   AntiThromb III Func 85 75 - 120 %    Comment: Performed at [facility]  Vitamin B12     Status: Abnormal   Collection Time: 07/26/15  4:12 AM  Result Value Ref Range   Vitamin B-12 164 (L) 180 - 914 pg/mL    Comment: (NOTE) This assay is not validated for testing neonatal or myeloproliferative syndrome specimens for Vitamin B12 levels. Performed at [facility]   Iron and TIBC     Status: Abnormal   Collection Time: 07/26/15  4:12 AM  Result Value Ref Range   Iron 9 (L) 28 - 170 ug/dL   TIBC 397 250 - 450 ug/dL   Saturation Ratios 2 (L) 10.4 - 31.8 %   UIBC 388 ug/dL  Ferritin     Status: Abnormal   Collection Time: 07/26/15  4:12 AM  Result Value Ref Range   Ferritin 7 (L) 11 - 307 ng/mL  Folate     Status: None   Collection Time: 07/26/15  4:12 AM  Result Value Ref Range   Folate 12.2 >5.9 ng/mL  Homocysteine     Status: None   Collection Time: 07/26/15  4:12 AM  Result Value Ref Range   Homocysteine 14.5 0.0 - 15.0 umol/L    Comment: (NOTE) Performed At: [facility] [address] [place], [place] [id number] Lindon Romp MD UP:[id number]   Protein C, total     Status: None   Collection Time: 07/26/15  4:12 AM  Result Value Ref Range   Protein C, Total 104 60 - 150 %    Comment: (NOTE) Performed At: [facility] [place], [place] [id number] Lindon Romp MD [id number]   Protein S, total     Status: None   Collection Time: 07/26/15  4:12 AM  Result Value Ref Range   Protein S Ag, Total 82 60 - 150 %    Comment: (NOTE) This test was developed and its performance characteristics determined by [facility]. It has not been cleared or approved by the Food and Drug Administration. Performed At: [facility] [place], [place] [id number] Lindon Romp MD [id number]   Protein S activity     Status: None   Collection Time: 07/26/15  4:12 AM  Result Value Ref Range   Protein S Activity 73 63 - 140 %    Comment: (NOTE) Protein S activity may be falsely increased (masking an abnormal, low result) in patients receiving direct Xa inhibitor (e.g., rivaroxaban, apixaban, edoxaban) or a direct thrombin inhibitor (e.g., dabigatran) anticoagulant treatment due to assay interference by these drugs. Performed At: [facility] [place], [place] [id number] Lindon Romp MD [id number]   Protein C activity     Status: None   Collection Time: 07/26/15  4:12 AM  Result Value Ref Range   Protein C Activity 142 73 - 180 %    Comment: (NOTE) Performed At: [facility] [place], [place] [id number] Lindon Romp MD [id number]   Reticulocytes     Status: Abnormal   Collection Time: 07/26/15  4:12 AM  Result Value Ref Range   Retic Ct Pct 1.9 0.4 - 3.1 %   RBC. 3.70 (L) 3.80 - 5.20 MIL/uL   Retic Count, Manual 70.3 19.0 - 183.0 K/uL  Beta-2-glycoprotein i abs, IgG/M/A     Status: None   Collection  Time: 07/26/15  4:12 AM  Result Value Ref Range   Beta-2 Glyco I IgG <9 0 - 20 GPI IgG units    Comment: (NOTE) The reference interval reflects a 3SD or 99th percentile interval, which is thought to represent a potentially clinically significant result in accordance with the International Consensus  Statement on the classification criteria for definitive antiphospholipid syndrome (APS). J Thromb Haem 2006;4:295-306.    Beta-2-Glycoprotein I IgM <9 0 - 32 GPI IgM units    Comment: (NOTE) The reference interval reflects a 3SD or 99th percentile interval, which is thought to represent a potentially clinically significant result in accordance with the International Consensus Statement on the classification criteria for definitive antiphospholipid syndrome (APS). J Thromb Haem 2006;4:295-306. Performed At: [facility] [place], [place] [id number] Lindon Romp MD [id number]    Beta-2-Glycoprotein I IgA <9 0 - 25 GPI IgA units    Comment: (NOTE) The reference interval reflects a 3SD or 99th percentile interval, which is thought to represent a potentially clinically significant result in accordance with the International Consensus Statement on the classification criteria for definitive antiphospholipid syndrome (APS). J Thromb Haem 2006;4:295-306.   Lupus anticoagulant panel     Status: None   Collection Time: 07/26/15  4:12 AM  Result Value Ref Range   PTT Lupus Anticoagulant 30.9 0.0 - 51.9 sec    Comment:               **Please note reference interval change**   DRVVT 41.7 0.0 - 47.0 sec   Lupus Anticoag Interp Comment:     Comment: (NOTE) No lupus anticoagulant was detected. Performed At: [facility] [place], [place] [id number] Lindon Romp MD SV:[id number]   Heparin level (unfractionated)     Status: None   Collection Time: 07/26/15 12:43 PM  Result Value Ref Range   Heparin Unfractionated 0.60 0.30 - 0.70 IU/mL    Comment:        IF HEPARIN RESULTS ARE BELOW EXPECTED VALUES, AND PATIENT DOSAGE HAS BEEN CONFIRMED, SUGGEST FOLLOW UP TESTING OF ANTITHROMBIN III LEVELS.   APTT     Status: Abnormal   Collection Time: 07/26/15 12:43 PM  Result Value Ref Range   aPTT 37 (H) 24 - 36 seconds    Comment:        IF  BASELINE aPTT IS ELEVATED, SUGGEST PATIENT RISK ASSESSMENT BE USED TO DETERMINE APPROPRIATE ANTICOAGULANT THERAPY.   ECHOCARDIOGRAM COMPLETE     Status: None   Collection Time: 07/26/15  1:21 PM  Result Value Ref Range   Weight 4,881.6 oz   Height 67 in   BP 153/91 mmHg  Heparin level (unfractionated)     Status: Abnormal   Collection Time: 07/26/15  6:39 PM  Result Value Ref Range   Heparin Unfractionated 0.24 (L) 0.30 - 0.70 IU/mL    Comment:        IF HEPARIN RESULTS ARE BELOW EXPECTED VALUES, AND PATIENT DOSAGE HAS BEEN CONFIRMED, SUGGEST FOLLOW UP TESTING OF ANTITHROMBIN III LEVELS.   APTT     Status: None   Collection Time: 07/26/15  6:39 PM  Result Value Ref Range   aPTT 33 24 - 36 seconds  CBC     Status: Abnormal   Collection Time: 07/27/15  2:39 AM  Result Value Ref Range   WBC 6.7 3.6 - 11.0 K/uL   RBC 3.74 (L) 3.80 - 5.20 MIL/uL   Hemoglobin 7.9 (L) 12.0 - 16.0  g/dL    Comment: RESULT REPEATED AND VERIFIED   HCT 26.6 (L) 35.0 - 47.0 %   MCV 71.2 (L) 80.0 - 100.0 fL   MCH 21.1 (L) 26.0 - 34.0 pg   MCHC 29.6 (L) 32.0 - 36.0 g/dL   RDW 19.2 (H) 11.5 - 14.5 %   Platelets 341 150 - 440 K/uL  Heparin level (unfractionated)     Status: Abnormal   Collection Time: 07/27/15  2:39 AM  Result Value Ref Range   Heparin Unfractionated 0.23 (L) 0.30 - 0.70 IU/mL    Comment:        IF HEPARIN RESULTS ARE BELOW EXPECTED VALUES, AND PATIENT DOSAGE HAS BEEN CONFIRMED, SUGGEST FOLLOW UP TESTING OF ANTITHROMBIN III LEVELS.   APTT     Status: Abnormal   Collection Time: 07/27/15  2:39 AM  Result Value Ref Range   aPTT 37 (H) 24 - 36 seconds    Comment:        IF BASELINE aPTT IS ELEVATED, SUGGEST PATIENT RISK ASSESSMENT BE USED TO DETERMINE APPROPRIATE ANTICOAGULANT THERAPY.      PHQ2/9: Depression screen [facility] 2/9 03/04/2015 12/03/2014 09/02/2014  Decreased Interest 0 0 0  Down, Depressed, Hopeless 0 0 0  PHQ - 2 Score 0 0 0     Fall Risk: Fall Risk  03/04/2015  12/03/2014 09/02/2014  Falls in the past year? No No No    Assessment & Plan  1. Other pulmonary embolism without acute cor pulmonale (HCC)  Continue Eliquis  2. Adrenal nodule ([place])  - CT ABDOMEN W WO CONTRAST; Future  3. Pulmonary nodules/lesions, multiple  Recheck in 6 months - since we will have to repeat for PE   4. Acute deep vein thrombosis (DVT) of right lower extremity, unspecified vein (HCC)  On Eliquis  5. Benign hypertension  Well controlled - carvedilol (COREG) 6.25 MG tablet; Take 1 tablet (6.25 mg total) by mouth 2 (two) times daily with a meal.  Dispense: 180 tablet; Refill: 0  6. Encounter for initial prescription of other contraceptives  - Ambulatory referral to Gynecology

## 2015-08-03 ENCOUNTER — Inpatient Hospital Stay: Payer: 59 | Admitting: Oncology

## 2015-08-03 ENCOUNTER — Inpatient Hospital Stay: Payer: 59 | Attending: Oncology | Admitting: Oncology

## 2015-08-03 ENCOUNTER — Inpatient Hospital Stay: Payer: 59

## 2015-08-03 VITALS — BP 175/90 | HR 80 | Temp 96.0°F | Resp 18 | Ht 67.0 in | Wt 340.4 lb

## 2015-08-03 DIAGNOSIS — R05 Cough: Secondary | ICD-10-CM

## 2015-08-03 DIAGNOSIS — R002 Palpitations: Secondary | ICD-10-CM | POA: Insufficient documentation

## 2015-08-03 DIAGNOSIS — I420 Dilated cardiomyopathy: Secondary | ICD-10-CM

## 2015-08-03 DIAGNOSIS — Z7901 Long term (current) use of anticoagulants: Secondary | ICD-10-CM

## 2015-08-03 DIAGNOSIS — E039 Hypothyroidism, unspecified: Secondary | ICD-10-CM

## 2015-08-03 DIAGNOSIS — I10 Essential (primary) hypertension: Secondary | ICD-10-CM

## 2015-08-03 DIAGNOSIS — E669 Obesity, unspecified: Secondary | ICD-10-CM

## 2015-08-03 DIAGNOSIS — I2699 Other pulmonary embolism without acute cor pulmonale: Secondary | ICD-10-CM

## 2015-08-03 DIAGNOSIS — Z86711 Personal history of pulmonary embolism: Secondary | ICD-10-CM

## 2015-08-03 DIAGNOSIS — Z79899 Other long term (current) drug therapy: Secondary | ICD-10-CM

## 2015-08-03 DIAGNOSIS — D509 Iron deficiency anemia, unspecified: Secondary | ICD-10-CM

## 2015-08-03 MED ORDER — SODIUM CHLORIDE 0.9 % IV SOLN
510.0000 mg | Freq: Once | INTRAVENOUS | Status: AC
  Administered 2015-08-03: 510 mg via INTRAVENOUS
  Filled 2015-08-03: qty 17

## 2015-08-03 MED ORDER — SODIUM CHLORIDE 0.9 % IV SOLN
Freq: Once | INTRAVENOUS | Status: AC
  Administered 2015-08-03: 12:00:00 via INTRAVENOUS
  Filled 2015-08-03: qty 1000

## 2015-08-03 NOTE — Progress Notes (Signed)
Pt stated BP elevated related to not taking medication yet and nervous.  No other changes or complaints

## 2015-08-04 NOTE — Progress Notes (Signed)
[place]  [phone] [phone]) [phone]  ID: Madeline Dominguez OB: 19-Feb-1965  MR#: [id number]  [id number]  Patient Care Team: Steele Sizer, MD as PCP - General (Family Medicine)  CHIEF COMPLAINT: Bilateral pulmonary embolism, iron deficiency anemia.  INTERVAL HISTORY: Patient is a 50 year old female who recently presented to the emergency room with increasing shortness of breath and pleuritic chest pain. Subsequent workup included a CT scan which revealed bilateral pulmonary embolism. She was started on Eliquis. Patient was also found to have iron deficiency anemia. Currently, she feels well and back to her baseline. She has no neurologic complaints. She denies any recent fevers. She denies any chest pain or shortness of breath. She has no nausea, vomiting, constipation, or diarrhea. She has no melena or hematochezia. She has no urinary complaints. Patient offers no further specific complaints.  REVIEW OF SYSTEMS:   Review of Systems  Constitutional: Negative.  Negative for fever, malaise/fatigue and weight loss.  Respiratory: Negative.  Negative for cough, hemoptysis and shortness of breath.   Cardiovascular: Negative.  Negative for chest pain.  Gastrointestinal: Negative.  Negative for abdominal pain, blood in stool and melena.  Genitourinary: Negative.   Neurological: Negative.  Negative for weakness.  Psychiatric/Behavioral: Negative.  The patient is not nervous/anxious.     As per HPI. Otherwise, a complete review of systems is negatve.  PAST MEDICAL HISTORY: Past Medical History:  Diagnosis Date  . Cough 07/25/2015  . Dilated cardiomyopathy ([place])   . H/O: hypothyroidism   . Heart palpitations   . Hypertension   . Iron deficiency   . Large breasts   . Morbid obesity ([place])   . Postpartum hemorrhage   . Thyroid disease     PAST SURGICAL HISTORY: Past Surgical History:  Procedure Laterality Date  . CESAREAN SECTION      FAMILY  HISTORY: Family History  Problem Relation Age of Onset  . CVA Father        ADVANCED DIRECTIVES:    HEALTH MAINTENANCE: Social History  Substance Use Topics  . Smoking status: Never Smoker  . Smokeless tobacco: Never Used  . Alcohol use No     Comment: occasional     Colonoscopy:  PAP:  Bone density:  Lipid panel:  Allergies  Allergen Reactions  . Aspirin Hives    Current Outpatient Prescriptions  Medication Sig Dispense Refill  . apixaban (ELIQUIS) 5 MG TABS tablet 2 tablets twice a day for one week then one tablet twice a day for six months 74 tablet 0  . carvedilol (COREG) 6.25 MG tablet Take 1 tablet (6.25 mg total) by mouth 2 (two) times daily with a meal. 180 tablet 0  . ferrous sulfate 325 (65 FE) MG tablet Take 1 tablet (325 mg total) by mouth 2 (two) times daily with a meal. 180 tablet 1  . vitamin B-12 1000 MCG tablet Take 1 tablet (1,000 mcg total) by mouth daily. 30 tablet 0  . albuterol (PROVENTIL HFA;VENTOLIN HFA) 108 (90 Base) MCG/ACT inhaler Inhale 2 puffs into the lungs every 6 (six) hours as needed for wheezing or shortness of breath. (Patient not taking: Reported on 08/03/2015) 1 Inhaler 0   No current facility-administered medications for this visit.     OBJECTIVE: Vitals:   08/03/15 0939  BP: (!) 175/90  Pulse: 80  Resp: 18  Temp: (!) 96 F (35.6 C)     Body mass index is 53.31 kg/m.    ECOG FS:0 - Asymptomatic  General: Well-developed,  well-nourished, no acute distress. Eyes: Pink conjunctiva, anicteric sclera. HEENT: Normocephalic, moist mucous membranes, clear oropharnyx. Lungs: Clear to auscultation bilaterally. Heart: Regular rate and rhythm. No rubs, murmurs, or gallops. Abdomen: Soft, nontender, nondistended. No organomegaly noted, normoactive bowel sounds. Musculoskeletal: No edema, cyanosis, or clubbing. Neuro: Alert, answering all questions appropriately. Cranial nerves grossly intact. Skin: No rashes or petechiae noted. Psych:  Normal affect. Lymphatics: No cervical, calvicular, axillary or inguinal LAD.   LAB RESULTS:  Lab Results  Component Value Date   NA 139 07/26/2015   K 3.5 07/26/2015   CL 103 07/26/2015   CO2 29 07/26/2015   GLUCOSE 115 (H) 07/26/2015   BUN 9 07/26/2015   CREATININE 0.61 07/26/2015   CALCIUM 8.3 (L) 07/26/2015   PROT 6.9 12/03/2014   ALBUMIN 3.6 12/03/2014   AST 8 12/03/2014   ALT 6 12/03/2014   ALKPHOS 77 12/03/2014   BILITOT 0.4 12/03/2014   GFRNONAA >60 07/26/2015   GFRAA >60 07/26/2015    Lab Results  Component Value Date   WBC 6.7 07/27/2015   NEUTROABS 4.1 07/26/2015   HGB 7.9 (L) 07/27/2015   HCT 26.6 (L) 07/27/2015   MCV 71.2 (L) 07/27/2015   PLT 341 07/27/2015   Lab Results  Component Value Date   IRON 9 (L) 07/26/2015   TIBC 397 07/26/2015   IRONPCTSAT 2 (L) 07/26/2015   Lab Results  Component Value Date   FERRITIN 7 (L) 07/26/2015     STUDIES: Ct Angio Chest Pe W Or Wo Contrast  Addendum Date: 07/25/2015   ADDENDUM REPORT: 07/25/2015 17:35 ADDENDUM: Critical Value/emergent results were called by me at the time of interpretation on 07/25/2015 at 5:33 pm to Dr. Rosanna Randy, covering for Dr. Steele Sizer , who verbally acknowledged these results. Given that this exam was completed after normal office hours, I personally discussed the results of the exam with the patient and her husband and and let them know that I had talked to Dr. Rosanna Randy. I instructed them to go immediately to the emergency department, given the clinical significance of pulmonary embolus. The patient voiced understanding of the situation and agree to proceed directly to the [facility] emergency room. I also called Marya Amsler, the charge nurse in the emergency room, to alert him of the patient's impending arrival. Electronically Signed   By: Misty Stanley M.D.   On: 07/25/2015 17:35  Result Date: 07/25/2015 CLINICAL DATA:  Cough and shortness of breath for 1 month. Positive right lower extremity DVT.  EXAM: CT ANGIOGRAPHY CHEST WITH CONTRAST TECHNIQUE: Multidetector CT imaging of the chest was performed using the standard protocol during bolus administration of intravenous contrast. Multiplanar CT image reconstructions and MIPs were obtained to evaluate the vascular anatomy. CONTRAST:  100 cc Isovue 370 COMPARISON:  None. FINDINGS: Cardiovascular: Heart is upper normal in a mildly enlarged. No thoracic aortic aneurysm. Pulmonary embolic disease is seen and lobar and segmental pulmonary arteries to the right lower lobe. There is evidence of pulmonary embolus and segmental left upper lobe pulmonary arteries. Mediastinum/Nodes: No mediastinal lymphadenopathy. 1.9 cm right thyroid nodule noted. There is no hilar lymphadenopathy. There is no axillary lymphadenopathy. The esophagus has normal imaging features. Lungs/Pleura: 5 mm subpleural nodule identified anterior right lung. 3 mm right lung nodule seen on image 40 series 5. Other scattered tiny bilateral pulmonary nodules are evident, measuring 5 mm or less in diameter. There is some atelectasis at the bases. No pulmonary edema or pleural effusion. Upper Abdomen: 2.1 cm left adrenal nodule incompletely  visualized. Musculoskeletal: Bone windows reveal no worrisome lytic or sclerotic osseous lesions. Review of the MIP images confirms the above findings. IMPRESSION: 1. Acute bilateral pulmonary embolus. No evidence for right heart strain. 2. Bilateral tiny pulmonary nodules. No follow-up needed if patient is low-risk (and has no known or suspected primary neoplasm). Non-contrast chest CT can be considered in 12 months if patient is high-risk. This recommendation follows the consensus statement: Guidelines for Management of Incidental Pulmonary Nodules Detected on CT Images:From the Fleischner Society 2017; published online before print (10.1148/radiol.IJ:2314499). 3. 2.1 cm left adrenal nodule has been incompletely visualized. CT imaging of the abdomen before and after  contrast could be performed for further evaluation. Electronically Signed: By: Misty Stanley M.D. On: 07/25/2015 17:15  US Venous Img Lower Unilateral Right  Result Date: 07/25/2015 CLINICAL DATA:  50 year old female with a history of right leg swelling EXAM: RIGHT LOWER EXTREMITY VENOUS DOPPLER ULTRASOUND TECHNIQUE: Gray-scale sonography with graded compression, as well as color Doppler and duplex ultrasound were performed to evaluate the lower extremity deep venous systems from the level of the common femoral vein and including the common femoral, femoral, profunda femoral, popliteal and calf veins including the posterior tibial, peroneal and gastrocnemius veins when visible. The superficial great saphenous vein was also interrogated. Spectral Doppler was utilized to evaluate flow at rest and with distal augmentation maneuvers in the common femoral, femoral and popliteal veins. COMPARISON:  None. FINDINGS: Contralateral Common Femoral Vein: Respiratory phasicity is normal and symmetric with the symptomatic side. No evidence of thrombus. Normal compressibility. Common Femoral Vein: Nonocclusive thrombus extending from the femoral vein. Respiratory phasicity and compressibility maintained. Saphenofemoral Junction: Nonocclusive thrombus at the common femoral vein extending up from the femoral vein. Profunda Femoral Vein: Partially occlusive thrombus involving the profunda femoris vein at the confluence. Femoral Vein: Occlusive thrombus of the femoral vein extending as far proximally as into the common femoral vein, and distally through the popliteal vein. Popliteal Vein: Nonocclusive thrombus of the popliteal vein extending into the femoral vein without involvement of the tibial veins. Calf Veins: Peroneal vein not visualized. No occlusive thrombus of the posterior tibial vein. Superficial Great Saphenous Vein: No evidence of thrombus. Normal compressibility and flow on color Doppler imaging. Other Findings:   None. IMPRESSION: Sonographic survey of the right lower extremity is positive for occlusive DVT of the femoral vein extending distally to involve the popliteal vein, and proximally into the common femoral vein. DVT of the common femoral vein is not occlusive and does not appear to extend into the pelvis. The technologist performing the study has called the preliminary report to the ordering physician. Signed, Dulcy Fanny. Earleen Newport, DO [facility] Electronically Signed   By: Corrie Mckusick D.O.   On: 07/25/2015 16:18  US Venous Img Upper Uni Left  Result Date: 07/27/2015 CLINICAL DATA:  Left upper extremity pain and swelling. EXAM: LEFT UPPER EXTREMITY VENOUS DOPPLER ULTRASOUND TECHNIQUE: Gray-scale sonography with graded compression, as well as color Doppler and duplex ultrasound were performed to evaluate the upper extremity deep venous system from the level of the subclavian vein and including the jugular, axillary, basilic, radial, ulnar and upper cephalic vein. Spectral Doppler was utilized to evaluate flow at rest and with distal augmentation maneuvers. COMPARISON:  CT 07/25/2015. FINDINGS: Contralateral Subclavian Vein: Respiratory phasicity is normal and symmetric with the symptomatic side. No evidence of thrombus. Normal compressibility. Internal Jugular Vein: No evidence of thrombus. Normal compressibility, respiratory phasicity and response to augmentation.  Subclavian Vein: No evidence of thrombus. Normal compressibility, respiratory phasicity and response to augmentation. Axillary Vein: No evidence of thrombus. Normal compressibility, respiratory phasicity and response to augmentation. Cephalic Vein: No evidence of thrombus. Normal compressibility, respiratory phasicity and response to augmentation. Basilic Vein: No evidence of thrombus. Normal compressibility, respiratory phasicity and response to augmentation. Brachial Veins: No evidence of  thrombus. Normal compressibility, respiratory phasicity and response to augmentation. Radial Veins: No evidence of thrombus. Normal compressibility, respiratory phasicity and response to augmentation. Ulnar Veins: No evidence of thrombus. Normal compressibility, respiratory phasicity and response to augmentation. Other Findings:  None visualized. IMPRESSION: No evidence of deep venous thrombosis. Electronically Signed   By: Marcello Moores  Register   On: 07/27/2015 10:59   ASSESSMENT: Bilateral pulmonary embolism, iron deficiency anemia.  PLAN:    1. Bilateral pulmonary embolism: Patient's entire hypercoagulable workup was negative. CT scan results reviewed independently and reported as above. Her PE is likely secondary to birth control pills. She is unsure instructed to discontinue these. Patient will require 6 months of Eliquis which is being managed by her primary care physician. Patient was also educated on additional transient risk factors for blood clots. No further intervention is needed at this time. 2. Iron deficiency anemia: Patient's hemoglobin and iron stores are significantly decreased. She will receive 510 mg IV Feraheme today. She will then return to clinic in 1 week for a second infusion. Patient will return to clinic in 3 months with repeat laboratory work and further evaluation. 3. Hypertension: Patient's blood pressure is significantly elevated today. Continue current medications. Treatment per primary care.  Patient expressed understanding and was in agreement with this plan. She also understands that She can call clinic at any time with any questions, concerns, or complaints.    Lloyd Huger, MD   08/04/2015 12:38 AM

## 2015-08-05 ENCOUNTER — Other Ambulatory Visit: Payer: Self-pay | Admitting: Family Medicine

## 2015-08-05 ENCOUNTER — Ambulatory Visit
Admission: RE | Admit: 2015-08-05 | Discharge: 2015-08-05 | Disposition: A | Discharge status: Home / Self Care | Payer: Commercial Managed Care - HMO | Source: Ambulatory Visit | Attending: Family Medicine | Admitting: Family Medicine

## 2015-08-05 DIAGNOSIS — E278 Other specified disorders of adrenal gland: Secondary | ICD-10-CM

## 2015-08-05 DIAGNOSIS — E279 Disorder of adrenal gland, unspecified: Secondary | ICD-10-CM

## 2015-08-12 ENCOUNTER — Inpatient Hospital Stay: Payer: 59

## 2015-08-12 VITALS — BP 137/77 | HR 77 | Temp 99.1°F | Resp 18

## 2015-08-12 DIAGNOSIS — Z86711 Personal history of pulmonary embolism: Secondary | ICD-10-CM | POA: Diagnosis not present

## 2015-08-12 DIAGNOSIS — D509 Iron deficiency anemia, unspecified: Secondary | ICD-10-CM

## 2015-08-12 MED ORDER — SODIUM CHLORIDE 0.9 % IV SOLN
Freq: Once | INTRAVENOUS | Status: AC
  Administered 2015-08-12: 15:00:00 via INTRAVENOUS
  Filled 2015-08-12: qty 1000

## 2015-08-12 MED ORDER — FERUMOXYTOL INJECTION 510 MG/17 ML
510.0000 mg | Freq: Once | INTRAVENOUS | Status: AC
  Administered 2015-08-12: 510 mg via INTRAVENOUS
  Filled 2015-08-12: qty 17

## 2015-08-16 NOTE — Telephone Encounter (Signed)
COMPLETED

## 2015-08-25 ENCOUNTER — Other Ambulatory Visit: Payer: Self-pay | Admitting: Family Medicine

## 2015-08-25 ENCOUNTER — Telehealth: Payer: Self-pay | Admitting: Family Medicine

## 2015-08-25 MED ORDER — APIXABAN 5 MG PO TABS: ORAL_TABLET | ORAL | 5 refills | Status: DC

## 2015-08-25 NOTE — Telephone Encounter (Signed)
Pt informed

## 2015-08-25 NOTE — Telephone Encounter (Signed)
done

## 2015-08-25 NOTE — Telephone Encounter (Signed)
Refill of Apixaban.

## 2015-09-02 ENCOUNTER — Ambulatory Visit (INDEPENDENT_AMBULATORY_CARE_PROVIDER_SITE_OTHER): Payer: 59 | Admitting: Family Medicine

## 2015-09-02 ENCOUNTER — Encounter: Payer: Self-pay | Admitting: Family Medicine

## 2015-09-02 VITALS — BP 138/78 | HR 88 | Temp 98.7°F | Resp 18 | Ht 67.0 in | Wt 346.3 lb

## 2015-09-02 DIAGNOSIS — R918 Other nonspecific abnormal finding of lung field: Secondary | ICD-10-CM

## 2015-09-02 DIAGNOSIS — E279 Disorder of adrenal gland, unspecified: Secondary | ICD-10-CM | POA: Diagnosis not present

## 2015-09-02 DIAGNOSIS — I10 Essential (primary) hypertension: Secondary | ICD-10-CM | POA: Diagnosis not present

## 2015-09-02 DIAGNOSIS — D509 Iron deficiency anemia, unspecified: Secondary | ICD-10-CM

## 2015-09-02 DIAGNOSIS — E278 Other specified disorders of adrenal gland: Secondary | ICD-10-CM

## 2015-09-02 DIAGNOSIS — I82401 Acute embolism and thrombosis of unspecified deep veins of right lower extremity: Secondary | ICD-10-CM | POA: Diagnosis not present

## 2015-09-02 DIAGNOSIS — E538 Deficiency of other specified B group vitamins: Secondary | ICD-10-CM | POA: Diagnosis not present

## 2015-09-02 MED ORDER — CARVEDILOL 6.25 MG PO TABS: 6.2500 mg | ORAL_TABLET | Freq: Two times a day (BID) | ORAL | 0 refills | Status: DC

## 2015-09-02 NOTE — Progress Notes (Signed)
Name: Madeline Dominguez   MRN: [id number]    DOB: 20-Jun-1965   Date:09/02/2015       Progress Note  Subjective  Chief Complaint  Chief Complaint  Patient presents with  . Follow-up    1 month-Patient has an GYN appointment Sept. 7 with [facility]   . DVT    Patient denies any trouble symptoms except edema occasionally in the area of her edema.      HPI  DVT/PE: patient was seen as an outpatient on 07/24 with complaints of right calf pain. Doppler US showed DVT, CT chest showed PE, pulmonary nodules and left adrenal gland nodule. She was admitted that day to [facility]. So far coagulation profile negative. Echo showed no heart strain.  She on Eliquis and is tolerating it well, no longer has SOB, still has a mild cough, states right leg gets swollen at the end of the day, advised to wear compression stocking hoses before going to work   B12 deficiency: had B12 injection with hematologist and is currently on supplementation otc  Iron deficiency anemia: off ocp because of DVT/PE, she is seeing hematologist and is having iron infusion. She has a long history of anemia ( since birth of her first child 58 years ago ), discussed colonoscopy because of her age. She will see gyn for possible IUD and we will get a hemoccult sample, if positive she will have colonoscopy   Adrenal gland nodule: on CT looks benign, but I will refer her to Endocrinologist to find out how to monitor/evaluate. She has Elisabeth Most at [facility] for thyroid management. S/p thyroid ablation   HTN: bp is controlled, no chest pain, or palpation  OSA: she is not using CPAP , she states she never got the machine. We will make sure she gets set up, CPAP of 11 cmH2O  Patient Active Problem List   Diagnosis Date Noted  . Right leg DVT ([place]) 08/01/2015  . Iron deficiency anemia 07/27/2015  . B12 deficiency 07/27/2015  . Bilateral pulmonary embolism ([place]) 07/25/2015  . Adrenal nodule ([place]) 07/25/2015  . Pulmonary nodules/lesions, multiple  07/25/2015  . OSA (obstructive sleep apnea) 05/18/2015  . Thyroid nodule 12/17/2014  . Heavy menstrual period 09/02/2014  . Gastroesophageal reflux disease without esophagitis 09/02/2014  . Extreme obesity ([place]) 06/22/2014  . Large breasts 06/22/2014  . Anemia, iron deficiency 06/22/2014  . Benign hypertension 06/22/2014  . H/O hyperthyroidism 06/22/2014  . LVH (left ventricular hypertrophy) 06/22/2014  . 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    Past Surgical History:  Procedure Laterality Date  . CESAREAN SECTION      Family History  Problem Relation Age of Onset  . CVA Father     Social History   Social History  . Marital status: Married    Spouse name: N/A  . Number of children: N/A  . Years of education: N/A   Occupational History  . Not on file.   Social History Main Topics  . Smoking status: Never Smoker  . Smokeless tobacco: Never Used  . Alcohol use No     Comment: occasional  . Drug use: No  . Sexual activity: Not Currently   Other Topics Concern  . Not on file   Social History Narrative   Lives at home with husband.     Current Outpatient Prescriptions:  .  apixaban (ELIQUIS) 5 MG TABS tablet, Twice daily, Disp: 60 tablet, Rfl: 5 .  carvedilol (COREG) 6.25 MG tablet, Take 1 tablet (6.25  mg total) by mouth 2 (two) times daily with a meal., Disp: 180 tablet, Rfl: 0 .  ferrous sulfate 325 (65 FE) MG tablet, Take 1 tablet (325 mg total) by mouth 2 (two) times daily with a meal., Disp: 180 tablet, Rfl: 1 .  vitamin B-12 1000 MCG tablet, Take 1 tablet (1,000 mcg total) by mouth daily., Disp: 30 tablet, Rfl: 0  Allergies  Allergen Reactions  . Aspirin Hives     ROS  Ten systems reviewed and is negative except as mentioned in HPI  Objective  Vitals:   09/02/15 0927  BP: 138/78  Pulse: 88  Resp: 18  Temp: 98.7 F (37.1 C)  TempSrc: Oral  SpO2: 95%  Weight: (!) 346 lb 4.8 oz (157.1 kg)  Height: _0  (1.702 m)    Body mass  index is 54.24 kg/m.  Physical Exam  Constitutional: Patient appears well-developed Obese  No distress.  HEENT: head atraumatic, normocephalic, pupils equal and reactive to light, e neck supple, throat within normal limits Cardiovascular: Normal rate, regular rhythm and normal heart sounds.  No murmur heard. right lower leg edema, no pain during compression of right calf. Pulmonary/Chest: Effort normal and breath sounds normal. No respiratory distress. Abdominal: Soft.  There is no tenderness. Psychiatric: Patient has a normal mood and affect. behavior is normal. Judgment and thought content normal.  Recent Results (from the past 2160 hour(s))  CBC with Differential     Status: Abnormal   Collection Time: 06/17/15 11:13 AM  Result Value Ref Range   WBC 7.7 3.6 - 11.0 K/uL   RBC 3.55 (L) 3.80 - 5.20 MIL/uL   Hemoglobin 8.1 (L) 12.0 - 16.0 g/dL   HCT 26.5 (L) 35.0 - 47.0 %   MCV 74.5 (L) 80.0 - 100.0 fL   MCH 22.7 (L) 26.0 - 34.0 pg   MCHC 30.4 (L) 32.0 - 36.0 g/dL   RDW 18.3 (H) 11.5 - 14.5 %   Platelets 245 150 - 440 K/uL   Neutrophils Relative % 68 %   Neutro Abs 5.3 1.4 - 6.5 K/uL   Lymphocytes Relative 21 %   Lymphs Abs 1.6 1.0 - 3.6 K/uL   Monocytes Relative 7 %   Monocytes Absolute 0.5 0.2 - 0.9 K/uL   Eosinophils Relative 3 %   Eosinophils Absolute 0.2 0 - 0.7 K/uL   Basophils Relative 1 %   Basophils Absolute 0.0 0 - 0.1 K/uL  Brain natriuretic peptide     Status: Abnormal   Collection Time: 06/17/15 11:13 AM  Result Value Ref Range   B Natriuretic Peptide 110.0 (H) 0.0 - 100.0 pg/mL  Basic metabolic panel     Status: Abnormal   Collection Time: 06/17/15 11:13 AM  Result Value Ref Range   Sodium 139 135 - 145 mmol/L   Potassium 3.6 3.5 - 5.1 mmol/L   Chloride 105 101 - 111 mmol/L   CO2 26 22 - 32 mmol/L   Glucose, Bld 109 (H) 65 - 99 mg/dL   BUN 10 6 - 20 mg/dL   Creatinine, Ser 0.67 0.44 - 1.00 mg/dL   Calcium 8.3 (L) 8.9 - 10.3 mg/dL   GFR calc non Af Amer  >60 >60 mL/min   GFR calc Af Amer >60 >60 mL/min    Comment: (NOTE) The eGFR has been calculated using the CKD EPI equation. This calculation has not been validated in all clinical situations. eGFR's persistently <60 mL/min signify possible Chronic Kidney Disease.    Anion gap  8 5 - 15  Troponin I     Status: None   Collection Time: 06/17/15 11:13 AM  Result Value Ref Range   Troponin I <0.03 <0.031 ng/mL    Comment:        NO INDICATION OF MYOCARDIAL INJURY.   CBC with Differential/Platelet     Status: Abnormal   Collection Time: 07/25/15 12:18 PM  Result Value Ref Range   WBC 7.6 3.6 - 11.0 K/uL   RBC 3.75 (L) 3.80 - 5.20 MIL/uL   Hemoglobin 7.7 (L) 12.0 - 16.0 g/dL   HCT 26.2 (L) 35.0 - 47.0 %   MCV 69.9 (L) 80.0 - 100.0 fL   MCH 20.7 (L) 26.0 - 34.0 pg   MCHC 29.6 (L) 32.0 - 36.0 g/dL   RDW 19.4 (H) 11.5 - 14.5 %   Platelets 305 150 - 440 K/uL   Neutrophils Relative % 68 %   Neutro Abs 5.1 1.4 - 6.5 K/uL   Lymphocytes Relative 24 %   Lymphs Abs 1.8 1.0 - 3.6 K/uL   Monocytes Relative 6 %   Monocytes Absolute 0.5 0.2 - 0.9 K/uL   Eosinophils Relative 1 %   Eosinophils Absolute 0.1 0 - 0.7 K/uL   Basophils Relative 1 %   Basophils Absolute 0.1 0 - 0.1 K/uL  Fibrin derivatives D-Dimer ([facility] only)     Status: Abnormal   Collection Time: 07/25/15 12:18 PM  Result Value Ref Range   Fibrin derivatives D-dimer ([facility]) 2,185 (H) 0 - 499    Comment: <> Exclusion of Venous Thromboembolism (VTE) - OUTPATIENTS ONLY        (Emergency Department or [place])             0-499 ng/ml (FEU)  : With a low to intermediate pretest                                        probability for VTE this test result                                        excludes the diagnosis of VTE.           > 499 ng/ml (FEU)  : VTE not excluded.  Additional work up                                   for VTE is required.   <>  Testing on Inpatients and Evaluation of Disseminated Intravascular         Coagulation (DIC)             Reference Range:   0-499 ng/ml (FEU)   CBC with Differential     Status: Abnormal   Collection Time: 07/25/15  5:34 PM  Result Value Ref Range   WBC 9.0 3.6 - 11.0 K/uL   RBC 3.84 3.80 - 5.20 MIL/uL   Hemoglobin 8.1 (L) 12.0 - 16.0 g/dL   HCT 27.1 (L) 35.0 - 47.0 %   MCV 70.5 (L) 80.0 - 100.0 fL   MCH 21.0 (L) 26.0 - 34.0 pg   MCHC 29.8 (L) 32.0 - 36.0 g/dL   RDW 19.2 (H) 11.5 - 14.5 %   Platelets 339 150 - 440  K/uL   Neutrophils Relative % 65 %   Neutro Abs 5.8 1.4 - 6.5 K/uL   Lymphocytes Relative 24 %   Lymphs Abs 2.2 1.0 - 3.6 K/uL   Monocytes Relative 8 %   Monocytes Absolute 0.8 0.2 - 0.9 K/uL   Eosinophils Relative 2 %   Eosinophils Absolute 0.2 0 - 0.7 K/uL   Basophils Relative 1 %   Basophils Absolute 0.1 0 - 0.1 K/uL  Basic metabolic panel     Status: Abnormal   Collection Time: 07/25/15  5:34 PM  Result Value Ref Range   Sodium 140 135 - 145 mmol/L   Potassium 3.7 3.5 - 5.1 mmol/L   Chloride 105 101 - 111 mmol/L   CO2 29 22 - 32 mmol/L   Glucose, Bld 98 65 - 99 mg/dL   BUN 9 6 - 20 mg/dL   Creatinine, Ser 0.80 0.44 - 1.00 mg/dL   Calcium 8.2 (L) 8.9 - 10.3 mg/dL   GFR calc non Af Amer >60 >60 mL/min   GFR calc Af Amer >60 >60 mL/min    Comment: (NOTE) The eGFR has been calculated using the CKD EPI equation. This calculation has not been validated in all clinical situations. eGFR's persistently <60 mL/min signify possible Chronic Kidney Disease.    Anion gap 6 5 - 15  Protime-INR     Status: None   Collection Time: 07/25/15  5:34 PM  Result Value Ref Range   Prothrombin Time 13.5 11.4 - 15.0 seconds   INR 1.01   APTT     Status: None   Collection Time: 07/25/15  5:34 PM  Result Value Ref Range   aPTT 24 24 - 36 seconds  Heparin level (unfractionated)     Status: Abnormal   Collection Time: 07/25/15  5:45 PM  Result Value Ref Range   Heparin Unfractionated <0.10 (L) 0.30 - 0.70 IU/mL    Comment:        IF HEPARIN  RESULTS ARE BELOW EXPECTED VALUES, AND PATIENT DOSAGE HAS BEEN CONFIRMED, SUGGEST FOLLOW UP TESTING OF ANTITHROMBIN III LEVELS.   Hemoglobin A1c     Status: None   Collection Time: 07/25/15  5:45 PM  Result Value Ref Range   Hgb A1c MFr Bld 5.1 4.0 - 6.0 %  TSH     Status: None   Collection Time: 07/25/15  5:45 PM  Result Value Ref Range   TSH 2.898 0.350 - 4.500 uIU/mL  CBC with Differential/Platelet     Status: Abnormal   Collection Time: 07/26/15  4:12 AM  Result Value Ref Range   WBC 7.2 3.6 - 11.0 K/uL   RBC 3.68 (L) 3.80 - 5.20 MIL/uL   Hemoglobin 7.9 (L) 12.0 - 16.0 g/dL   HCT 26.1 (L) 35.0 - 47.0 %   MCV 70.7 (L) 80.0 - 100.0 fL   MCH 21.6 (L) 26.0 - 34.0 pg   MCHC 30.5 (L) 32.0 - 36.0 g/dL   RDW 19.2 (H) 11.5 - 14.5 %   Platelets 331 150 - 440 K/uL   Neutrophils Relative % 57 %   Neutro Abs 4.1 1.4 - 6.5 K/uL   Lymphocytes Relative 31 %   Lymphs Abs 2.2 1.0 - 3.6 K/uL   Monocytes Relative 9 %   Monocytes Absolute 0.7 0.2 - 0.9 K/uL   Eosinophils Relative 2 %   Eosinophils Absolute 0.2 0 - 0.7 K/uL   Basophils Relative 1 %   Basophils Absolute 0.1 0 - 0.1 K/uL  Basic metabolic panel     Status: Abnormal   Collection Time: 07/26/15  4:12 AM  Result Value Ref Range   Sodium 139 135 - 145 mmol/L   Potassium 3.5 3.5 - 5.1 mmol/L   Chloride 103 101 - 111 mmol/L   CO2 29 22 - 32 mmol/L   Glucose, Bld 115 (H) 65 - 99 mg/dL   BUN 9 6 - 20 mg/dL   Creatinine, Ser 0.61 0.44 - 1.00 mg/dL   Calcium 8.3 (L) 8.9 - 10.3 mg/dL   GFR calc non Af Amer >60 >60 mL/min   GFR calc Af Amer >60 >60 mL/min    Comment: (NOTE) The eGFR has been calculated using the CKD EPI equation. This calculation has not been validated in all clinical situations. eGFR's persistently <60 mL/min signify possible Chronic Kidney Disease.    Anion gap 7 5 - 15  Cardiolipin antibodies, IgG, IgM, IgA     Status: None   Collection Time: 07/26/15  4:12 AM  Result Value Ref Range   Anticardiolipin  IgG <9 0 - 14 GPL U/mL    Comment: (NOTE)                          Negative:              <15                          Indeterminate:     15 - 20                          Low-Med Positive: >20 - 80                          High Positive:         >80    Anticardiolipin IgM <9 0 - 12 MPL U/mL    Comment: (NOTE)                          Negative:              <13                          Indeterminate:     13 - 20                          Low-Med Positive: >20 - 80                          High Positive:         >80    Anticardiolipin IgA <9 0 - 11 APL U/mL    Comment: (NOTE)                          Negative:              <12                          Indeterminate:     12 - 20  Low-Med Positive: >20 - 80                          High Positive:         >80 Performed At: [facility] [place], [place] [id number] Lindon Romp MD [id number]   Antithrombin III     Status: None   Collection Time: 07/26/15  4:12 AM  Result Value Ref Range   AntiThromb III Func 85 75 - 120 %    Comment: Performed at [facility]  Vitamin B12     Status: Abnormal   Collection Time: 07/26/15  4:12 AM  Result Value Ref Range   Vitamin B-12 164 (L) 180 - 914 pg/mL    Comment: (NOTE) This assay is not validated for testing neonatal or myeloproliferative syndrome specimens for Vitamin B12 levels. Performed at [facility]   Iron and TIBC     Status: Abnormal   Collection Time: 07/26/15  4:12 AM  Result Value Ref Range   Iron 9 (L) 28 - 170 ug/dL   TIBC 397 250 - 450 ug/dL   Saturation Ratios 2 (L) 10.4 - 31.8 %   UIBC 388 ug/dL  Ferritin     Status: Abnormal   Collection Time: 07/26/15  4:12 AM  Result Value Ref Range   Ferritin 7 (L) 11 - 307 ng/mL  Folate     Status: None   Collection Time: 07/26/15  4:12 AM  Result Value Ref Range   Folate 12.2 >5.9 ng/mL  Homocysteine     Status: None   Collection Time: 07/26/15  4:12 AM   Result Value Ref Range   Homocysteine 14.5 0.0 - 15.0 umol/L    Comment: (NOTE) Performed At: [facility] [address] [place], [place] [id number] Lindon Romp MD [id number]   Protein C, total     Status: None   Collection Time: 07/26/15  4:12 AM  Result Value Ref Range   Protein C, Total 104 60 - 150 %    Comment: (NOTE) Performed At: [facility] [place], [place] [id number] Lindon Romp MD [id number]   Protein S, total     Status: None   Collection Time: 07/26/15  4:12 AM  Result Value Ref Range   Protein S Ag, Total 82 60 - 150 %    Comment: (NOTE) This test was developed and its performance characteristics determined by [facility]. It has not been cleared or approved by the Food and Drug Administration. Performed At: [facility] [place], [place] [id number] Lindon Romp MD MS:[id number]   Protein S activity     Status: None   Collection Time: 07/26/15  4:12 AM  Result Value Ref Range   Protein S Activity 73 63 - 140 %    Comment: (NOTE) Protein S activity may be falsely increased (masking an abnormal, low result) in patients receiving direct Xa inhibitor (e.g., rivaroxaban, apixaban, edoxaban) or a direct thrombin inhibitor (e.g., dabigatran) anticoagulant treatment due to assay interference by these drugs. Performed At: [facility] [place], [place] [id number] Lindon Romp MD [id number]   Protein C activity     Status: None   Collection Time: 07/26/15  4:12 AM  Result Value Ref Range   Protein C Activity 142 73 - 180 %    Comment: (NOTE) Performed At: [facility] [address] [place], [place] [id number] Evette Doffing  Darrick Penna MD GY:[id number]   Prothrombin gene mutation     Status: None   Collection Time: 07/26/15  4:12 AM  Result Value Ref Range   Recommendations-PTGENE: Comment     Comment: (NOTE) NEGATIVE No mutation identified. Comment: A  point mutation (G20210A) in the factor II (prothrombin) gene is the second most common cause of inherited thrombophilia. The incidence of this mutation in the [place] Caucasian population is about 2% and in the [place] American population it is approximately 0.5%. This mutation is rare in the [place] and Native American population. Being heterozygous for a prothrombin mutation increases the risk for developing venous thrombosis about 2 to 3 times above the general population risk. Being homozygous for the prothrombin gene mutation increases the relative risk for venous thrombosis further, although it is not yet known how much further the risk is increased. In women heterozygous for the prothrombin gene mutation, the use of estrogen containing oral contraceptives increases the relative risk of venous thrombosis about 16 times and the risk of developing cerebral thrombosis is also significantly increased. In pregnancy the pr othrombin gene mutation increases risk for venous thrombosis and may increase risk for stillbirth, placental abruption, pre-eclampsia and fetal growth restriction. If the patient possesses two or more congenital or acquired thrombophilic risk factors, the risk for thrombosis may rise to more than the sum of the risk ratios for the individual mutations. This assay detects only the prothrombin G20210A mutation and does not measure genetic abnormalities elsewhere in the genome. Other thrombotic risk factors may be pursued through systematic clinical laboratory analysis. These factors include the R506Q (Leiden) mutation in the Factor V gene, plasma homocysteine levels, as well as testing for deficiencies of antithrombin III, protein C and protein S.    Additional Information Comment     Comment: (NOTE) Genetic Counselors are available for health care providers to discuss results at [phone]). Methodology: DNA analysis of the Factor II gene was performed by  PCR amplification followed by restriction analysis. The diagnostic sensitivity is >99% for both. All the tests must be combined with clinical information for the most accurate interpretation. Molecular-based testing is highly accurate, but as in any laboratory test, diagnostic errors may occur. This test was developed and its performance characteristics determined by [facility]. It has not been cleared or approved by the Food and Drug Administration. Poort SR, et al. Blood. 1996; 03:0092-3300. Varga EA. Circulation. 2004; 762:U63-F35. Mervin Hack, et Burton; 19:700-703. Allison Quarry, PhD, [facility] Ruben Reason, PhD, [facility] Jens Som, PhD, [facility] Annetta Maw, M.S., PhD, [facility] Alfredo Bach, PhD, [facility] Norva Riffle, PhD, [facility] Earlean Polka,  PhD, [facility] Performed At: [facility] [place], [place] [id number] Nechama Guard MD [id number]   Reticulocytes     Status: Abnormal   Collection Time: 07/26/15  4:12 AM  Result Value Ref Range   Retic Ct Pct 1.9 0.4 - 3.1 %   RBC. 3.70 (L) 3.80 - 5.20 MIL/uL   Retic Count, Manual 70.3 19.0 - 183.0 K/uL  Beta-2-glycoprotein i abs, IgG/M/A     Status: None   Collection Time: 07/26/15  4:12 AM  Result Value Ref Range   Beta-2 Glyco I IgG <9 0 - 20 GPI IgG units    Comment: (NOTE) The reference interval reflects a 3SD or 99th percentile interval, which is thought to represent a potentially clinically significant result in accordance with the International Consensus Statement on the classification criteria  for definitive antiphospholipid syndrome (APS). J Thromb Haem 2006;4:295-306.    Beta-2-Glycoprotein I IgM <9 0 - 32 GPI IgM units    Comment: (NOTE) The reference interval reflects a 3SD or 99th percentile interval, which is thought to represent a potentially clinically significant result in accordance with the International Consensus Statement on the  classification criteria for definitive antiphospholipid syndrome (APS). J Thromb Haem 2006;4:295-306. Performed At: [facility] [place], [place] [id number] Lindon Romp MD SW:[id number]    Beta-2-Glycoprotein I IgA <9 0 - 25 GPI IgA units    Comment: (NOTE) The reference interval reflects a 3SD or 99th percentile interval, which is thought to represent a potentially clinically significant result in accordance with the International Consensus Statement on the classification criteria for definitive antiphospholipid syndrome (APS). J Thromb Haem 2006;4:295-306.   Factor 5 leiden     Status: None   Collection Time: 07/26/15  4:12 AM  Result Value Ref Range   Recommendations-F5LEID: Comment     Comment: (NOTE) Result:  Negative (no mutation found) Factor V Leiden is a specific mutation (R506Q) in the factor V gene that is associated with an increased risk of venous thrombosis. Factor V Leiden is more resistant to inactivation by activated protein C.  As a result, factor V persists in the circulation leading to a mild hyper- coagulable state.  The Leiden mutation accounts for 90% - 95% of APC resistance.  Factor V Leiden has been reported in patients with deep vein thrombosis, pulmonary embolus, central retinal vein occlusion, cerebral sinus thrombosis and hepatic vein thrombosis. Other risk factors to be considered in the workup for venous thrombosis include the G20210A mutation in the factor II (prothrombin) gene, protein S and C deficiency, and antithrombin deficiencies. Anticardiolipin antibody and lupus anticoagulant analysis may be appropriate for certain patients, as well as homocysteine levels. Contact your local [facility] for information on how to order additi onal testing if desired.    Comment Comment     Comment: (NOTE) **Genetic counselors are available for health care providers to**  discuss results at [phone]  [phone]). Methodology: DNA analysis of the Factor V gene was performed by allele-specific PCR. The diagnostic sensitivity and specificity is >99% for both. Molecular-based testing is highly accurate, but as in any laboratory test, diagnostic errors may occur. All test results must be combined with clinical information for the most accurate interpretation. This test was developed and its performance characteristics determined by [facility]. It has not been cleared or approved by the Food and Drug Administration. References: Voelkerding K (1996).  Clin Lab Med [id number]. Allison Quarry, PhD, [facility] Ruben Reason, PhD, [facility] Jens Som, PhD, [facility] Annetta Maw, M.S., PhD, [facility] Alfredo Bach, PhD, [facility] Norva Riffle, PhD, [facility] Earlean Polka PhD, [facility] Performed At: [facility] [place], [place] [id number] Nelda Marseille [id number]   Lupus anticoagulant panel     Status: None   Collection Time: 07/26/15  4:12 AM  Result Value Ref Range   PTT Lupus Anticoagulant 30.9 0.0 - 51.9 sec    Comment:               **Please note reference interval change**   DRVVT 41.7 0.0 - 47.0 sec   Lupus Anticoag Interp Comment:     Comment: (NOTE) No lupus anticoagulant was detected. Performed At: [facility] [place], [place] [id number] Lindon Romp MD [id number]   Heparin level (unfractionated)  Status: None   Collection Time: 07/26/15 12:43 PM  Result Value Ref Range   Heparin Unfractionated 0.60 0.30 - 0.70 IU/mL    Comment:        IF HEPARIN RESULTS ARE BELOW EXPECTED VALUES, AND PATIENT DOSAGE HAS BEEN CONFIRMED, SUGGEST FOLLOW UP TESTING OF ANTITHROMBIN III LEVELS.   APTT     Status: Abnormal   Collection Time: 07/26/15 12:43 PM  Result Value Ref Range   aPTT 37 (H) 24 - 36 seconds    Comment:        IF BASELINE aPTT IS ELEVATED, SUGGEST PATIENT RISK ASSESSMENT BE USED TO DETERMINE  APPROPRIATE ANTICOAGULANT THERAPY.   ECHOCARDIOGRAM COMPLETE     Status: None   Collection Time: 07/26/15  1:21 PM  Result Value Ref Range   Weight 4,881.6 oz   Height 67 in   BP 153/91 mmHg  Heparin level (unfractionated)     Status: Abnormal   Collection Time: 07/26/15  6:39 PM  Result Value Ref Range   Heparin Unfractionated 0.24 (L) 0.30 - 0.70 IU/mL    Comment:        IF HEPARIN RESULTS ARE BELOW EXPECTED VALUES, AND PATIENT DOSAGE HAS BEEN CONFIRMED, SUGGEST FOLLOW UP TESTING OF ANTITHROMBIN III LEVELS.   APTT     Status: None   Collection Time: 07/26/15  6:39 PM  Result Value Ref Range   aPTT 33 24 - 36 seconds  CBC     Status: Abnormal   Collection Time: 07/27/15  2:39 AM  Result Value Ref Range   WBC 6.7 3.6 - 11.0 K/uL   RBC 3.74 (L) 3.80 - 5.20 MIL/uL   Hemoglobin 7.9 (L) 12.0 - 16.0 g/dL    Comment: RESULT REPEATED AND VERIFIED   HCT 26.6 (L) 35.0 - 47.0 %   MCV 71.2 (L) 80.0 - 100.0 fL   MCH 21.1 (L) 26.0 - 34.0 pg   MCHC 29.6 (L) 32.0 - 36.0 g/dL   RDW 19.2 (H) 11.5 - 14.5 %   Platelets 341 150 - 440 K/uL  Heparin level (unfractionated)     Status: Abnormal   Collection Time: 07/27/15  2:39 AM  Result Value Ref Range   Heparin Unfractionated 0.23 (L) 0.30 - 0.70 IU/mL    Comment:        IF HEPARIN RESULTS ARE BELOW EXPECTED VALUES, AND PATIENT DOSAGE HAS BEEN CONFIRMED, SUGGEST FOLLOW UP TESTING OF ANTITHROMBIN III LEVELS.   APTT     Status: Abnormal   Collection Time: 07/27/15  2:39 AM  Result Value Ref Range   aPTT 37 (H) 24 - 36 seconds    Comment:        IF BASELINE aPTT IS ELEVATED, SUGGEST PATIENT RISK ASSESSMENT BE USED TO DETERMINE APPROPRIATE ANTICOAGULANT THERAPY.       PHQ2/9: Depression screen [facility] 2/9 09/02/2015 03/04/2015 12/03/2014 09/02/2014  Decreased Interest 0 0 0 0  Down, Depressed, Hopeless 0 0 0 0  PHQ - 2 Score 0 0 0 0    Fall Risk: Fall Risk  09/02/2015 03/04/2015 12/03/2014 09/02/2014  Falls in the past year? No No No No    Functional Status Survey: Is the patient deaf or have difficulty hearing?: No Does the patient have difficulty seeing, even when wearing glasses/contacts?: No Does the patient have difficulty concentrating, remembering, or making decisions?: No Does the patient have difficulty walking or climbing stairs?: No Does the patient have difficulty dressing or bathing?: No Does the patient have difficulty doing  errands alone such as visiting a doctor's office or shopping?: No    Assessment & Plan  1. Adrenal nodule ([place])  - Ambulatory referral to Endocrinology  2. Pulmonary nodules/lesions, multiple  Continue Eliquis for 6 months to one year   3. Benign hypertension  - carvedilol (COREG) 6.25 MG tablet; Take 1 tablet (6.25 mg total) by mouth 2 (two) times daily with a meal.  Dispense: 180 tablet; Refill: 0  4. Anemia, iron deficiency  Continue iron infusion   5. Morbid obesity, unspecified obesity type [facility])  Discussed with the patient the risk posed by an increased BMI. Discussed importance of portion control, calorie counting and at least 150 minutes of physical activity weekly. Avoid sweet beverages and drink more water. Eat at least 6 servings of fruit and vegetables daily   6. Deep vein thrombosis (DVT) of right lower extremity, unspecified chronicity, unspecified vein (HCC)  Continue Eliquis - event July 2017  7. B12 deficiency

## 2015-09-08 ENCOUNTER — Telehealth: Payer: Self-pay | Admitting: Family Medicine

## 2015-09-11 NOTE — Telephone Encounter (Signed)
Is she calling your back ? Can you check old records to see where the study was done to begin with. Thank you

## 2015-09-14 NOTE — Telephone Encounter (Signed)
Spoke with patient and she stated that [facility] is [facility] her and told her that they did not have the prescription. She does not have the fax number.

## 2015-09-23 NOTE — Telephone Encounter (Signed)
[place] faxed information

## 2015-11-03 ENCOUNTER — Telehealth: Payer: Self-pay | Admitting: Family Medicine

## 2015-11-03 NOTE — Telephone Encounter (Signed)
Patient is requesting refill on carvedilol 6.25mg . Please send to [email or web address]. Only have enough to last through tomorrow.

## 2015-11-03 NOTE — Telephone Encounter (Signed)
Pt stated that she was taking medication as directed but that she has only 3 pills left, please advise

## 2015-11-04 ENCOUNTER — Other Ambulatory Visit: Payer: Self-pay | Admitting: Family Medicine

## 2015-11-04 NOTE — Telephone Encounter (Signed)
Patient verbally informed °

## 2015-11-04 NOTE — Telephone Encounter (Signed)
Spoke to  pharmacy and Hoy Morn at the pharmacy stated that it was ready for pick uo

## 2015-11-04 NOTE — Telephone Encounter (Signed)
I sent #[phone] on 09/02/2015. Did they break rx in 30 days supply

## 2015-11-06 NOTE — Progress Notes (Signed)
Madeline Dominguez  Telephone:(336) [phone] Fax:(336) [phone]  ID: [name] OB: 11/19/1965  MR#: [id number]  [id number]  Patient Care Team: Steele Sizer, MD as PCP - General (Family Medicine)  CHIEF COMPLAINT: Bilateral pulmonary embolism, iron deficiency anemia.  INTERVAL HISTORY: Patient returns to clinic today for repeat laboratory work and further evaluation. She currently feels well and is asymptomatic. She has noticed more erratic monthly cycle. She continues to tolerate Eliquis without significant side effects. She has no neurologic complaints. She denies any recent fevers. She denies any chest pain or shortness of breath. She has no nausea, vomiting, constipation, or diarrhea. She has no melena or hematochezia. She has no urinary complaints. Patient offers no specific complaints today.  REVIEW OF SYSTEMS:   Review of Systems  Constitutional: Negative.  Negative for fever, malaise/fatigue and weight loss.  Respiratory: Negative.  Negative for cough, hemoptysis and shortness of breath.   Cardiovascular: Negative.  Negative for chest pain.  Gastrointestinal: Negative.  Negative for abdominal pain, blood in stool and melena.  Genitourinary: Negative.   Neurological: Negative.  Negative for weakness.  Psychiatric/Behavioral: Negative.  The patient is not nervous/anxious.     As per HPI. Otherwise, a complete review of systems is negative.  PAST MEDICAL HISTORY: Past Medical History:  Diagnosis Date  . Cough 07/25/2015  . Dilated cardiomyopathy ([place])   . H/O: hypothyroidism   . Heart palpitations   . Hypertension   . Iron deficiency   . Large breasts   . Morbid obesity ([place])   . Postpartum hemorrhage   . Thyroid disease     PAST SURGICAL HISTORY: Past Surgical History:  Procedure Laterality Date  . CESAREAN SECTION      FAMILY HISTORY: Family History  Problem Relation Age of Onset  . CVA Father        ADVANCED DIRECTIVES:    HEALTH  MAINTENANCE: Social History  Substance Use Topics  . Smoking status: Never Smoker  . Smokeless tobacco: Never Used  . Alcohol use No     Comment: occasional     Colonoscopy:  PAP:  Bone density:  Lipid panel:  Allergies  Allergen Reactions  . Aspirin Nausea And Vomiting    Current Outpatient Prescriptions  Medication Sig Dispense Refill  . apixaban (ELIQUIS) 5 MG TABS tablet Twice daily 60 tablet 5  . carvedilol (COREG) 6.25 MG tablet Take 1 tablet (6.25 mg total) by mouth 2 (two) times daily with a meal. 180 tablet 0  . ferrous sulfate 325 (65 FE) MG tablet Take 1 tablet (325 mg total) by mouth 2 (two) times daily with a meal. 180 tablet 1  . vitamin B-12 1000 MCG tablet Take 1 tablet (1,000 mcg total) by mouth daily. 30 tablet 0   No current facility-administered medications for this visit.     OBJECTIVE: Vitals:   11/07/15 1046  BP: 140/82  Pulse: 75  Temp: 98 F (36.7 C)     Body mass index is 55.25 kg/m.    ECOG FS:0 - Asymptomatic  General: Well-developed, well-nourished, no acute distress. Eyes: Pink conjunctiva, anicteric sclera. Lungs: Clear to auscultation bilaterally. Heart: Regular rate and rhythm. No rubs, murmurs, or gallops. Abdomen: Soft, nontender, nondistended. No organomegaly noted, normoactive bowel sounds. Musculoskeletal: No edema, cyanosis, or clubbing. Neuro: Alert, answering all questions appropriately. Cranial nerves grossly intact. Skin: No rashes or petechiae noted. Psych: Normal affect.   LAB RESULTS:  Lab Results  Component Value Date   NA 139 07/26/2015  K 3.5 07/26/2015   CL 103 07/26/2015   CO2 29 07/26/2015   GLUCOSE 115 (H) 07/26/2015   BUN 9 07/26/2015   CREATININE 0.61 07/26/2015   CALCIUM 8.3 (L) 07/26/2015   PROT 6.9 12/03/2014   ALBUMIN 3.6 12/03/2014   AST 8 12/03/2014   ALT 6 12/03/2014   ALKPHOS 77 12/03/2014   BILITOT 0.4 12/03/2014   GFRNONAA >60 07/26/2015   GFRAA >60 07/26/2015    Lab Results    Component Value Date   WBC 5.6 11/07/2015   NEUTROABS 2.9 11/07/2015   HGB 11.9 (L) 11/07/2015   HCT 35.6 11/07/2015   MCV 88.3 11/07/2015   PLT 207 11/07/2015   Lab Results  Component Value Date   IRON 9 (L) 07/26/2015   TIBC 397 07/26/2015   IRONPCTSAT 2 (L) 07/26/2015   Lab Results  Component Value Date   FERRITIN 7 (L) 07/26/2015     STUDIES: No results found.  ASSESSMENT: Bilateral pulmonary embolism, iron deficiency anemia.  PLAN:    1. Bilateral pulmonary embolism: Patient's entire hypercoagulable workup was negative. CT scan results reviewed independently. Her PE was likely secondary to birth control pills. These have been discontinued. Patient will require 6 months of Eliquis which is being managed by her primary care physician completing treatment in approximately February 2018. Patient was also previously educated on additional transient risk factors for blood clots. No further intervention is needed at this time. 2. Iron deficiency anemia: Patient's hemoglobin has significantly improved and is now nearly within normal limits. Iron stores are pending at time of dictation. Patient is symptomatically improved, therefore she does not require additional IV Feraheme today. Return to clinic in 3 months with repeat laboratory work and further evaluation. 3. Hypertension: Patient's blood pressure is better controlled today. Continue current medications. Treatment per primary care.  Patient expressed understanding and was in agreement with this plan. She also understands that She can call clinic at any time with any questions, concerns, or complaints.    Lloyd Huger, MD   11/07/2015 11:13 AM

## 2015-11-07 ENCOUNTER — Inpatient Hospital Stay: Payer: 59 | Attending: Oncology

## 2015-11-07 ENCOUNTER — Inpatient Hospital Stay (HOSPITAL_BASED_OUTPATIENT_CLINIC_OR_DEPARTMENT_OTHER): Payer: 59 | Admitting: Oncology

## 2015-11-07 ENCOUNTER — Inpatient Hospital Stay: Payer: 59

## 2015-11-07 ENCOUNTER — Encounter: Payer: Self-pay | Admitting: Oncology

## 2015-11-07 VITALS — BP 140/82 | HR 75 | Temp 98.0°F | Wt 352.7 lb

## 2015-11-07 DIAGNOSIS — Z79899 Other long term (current) drug therapy: Secondary | ICD-10-CM | POA: Insufficient documentation

## 2015-11-07 DIAGNOSIS — E669 Obesity, unspecified: Secondary | ICD-10-CM | POA: Diagnosis not present

## 2015-11-07 DIAGNOSIS — Z87891 Personal history of nicotine dependence: Secondary | ICD-10-CM

## 2015-11-07 DIAGNOSIS — D508 Other iron deficiency anemias: Secondary | ICD-10-CM

## 2015-11-07 DIAGNOSIS — R05 Cough: Secondary | ICD-10-CM | POA: Diagnosis not present

## 2015-11-07 DIAGNOSIS — Z7901 Long term (current) use of anticoagulants: Secondary | ICD-10-CM

## 2015-11-07 DIAGNOSIS — I10 Essential (primary) hypertension: Secondary | ICD-10-CM

## 2015-11-07 DIAGNOSIS — Z86711 Personal history of pulmonary embolism: Secondary | ICD-10-CM | POA: Diagnosis not present

## 2015-11-07 DIAGNOSIS — R002 Palpitations: Secondary | ICD-10-CM | POA: Insufficient documentation

## 2015-11-07 DIAGNOSIS — I429 Cardiomyopathy, unspecified: Secondary | ICD-10-CM | POA: Diagnosis not present

## 2015-11-07 DIAGNOSIS — D509 Iron deficiency anemia, unspecified: Secondary | ICD-10-CM

## 2015-11-07 DIAGNOSIS — E039 Hypothyroidism, unspecified: Secondary | ICD-10-CM

## 2015-11-07 DIAGNOSIS — I2699 Other pulmonary embolism without acute cor pulmonale: Secondary | ICD-10-CM

## 2015-11-07 LAB — CBC WITH DIFFERENTIAL/PLATELET
BASOS PCT: 1 %
Basophils Absolute: 0 10*3/uL (ref 0–0.1)
Eosinophils Absolute: 0.2 10*3/uL (ref 0–0.7)
Eosinophils Relative: 3 %
HEMATOCRIT: 35.6 % (ref 35.0–47.0)
HEMOGLOBIN: 11.9 g/dL — AB (ref 12.0–16.0)
LYMPHS PCT: 39 %
Lymphs Abs: 2.2 10*3/uL (ref 1.0–3.6)
MCH: 29.5 pg (ref 26.0–34.0)
MCHC: 33.4 g/dL (ref 32.0–36.0)
MCV: 88.3 fL (ref 80.0–100.0)
MONOS PCT: 7 %
Monocytes Absolute: 0.4 10*3/uL (ref 0.2–0.9)
NEUTROS ABS: 2.9 10*3/uL (ref 1.4–6.5)
NEUTROS PCT: 50 %
Platelets: 207 10*3/uL (ref 150–440)
RBC: 4.03 MIL/uL (ref 3.80–5.20)
RDW: 15.6 % — ABNORMAL HIGH (ref 11.5–14.5)
WBC: 5.6 10*3/uL (ref 3.6–11.0)

## 2015-11-07 LAB — IRON AND TIBC
Iron: 46 ug/dL (ref 28–170)
SATURATION RATIOS: 17 % (ref 10.4–31.8)
TIBC: 277 ug/dL (ref 250–450)
UIBC: 231 ug/dL

## 2015-11-07 LAB — FERRITIN: Ferritin: 51 ng/mL (ref 11–307)

## 2015-12-02 ENCOUNTER — Ambulatory Visit: Payer: 59 | Admitting: Family Medicine

## 2015-12-20 ENCOUNTER — Ambulatory Visit (INDEPENDENT_AMBULATORY_CARE_PROVIDER_SITE_OTHER): Payer: 59 | Admitting: Family Medicine

## 2015-12-20 ENCOUNTER — Telehealth: Payer: Self-pay

## 2015-12-20 ENCOUNTER — Encounter: Payer: Self-pay | Admitting: Family Medicine

## 2015-12-20 VITALS — BP 138/86 | HR 89 | Temp 99.1°F | Resp 18 | Ht 67.0 in | Wt 356.0 lb

## 2015-12-20 DIAGNOSIS — E279 Disorder of adrenal gland, unspecified: Secondary | ICD-10-CM | POA: Diagnosis not present

## 2015-12-20 DIAGNOSIS — G4733 Obstructive sleep apnea (adult) (pediatric): Secondary | ICD-10-CM

## 2015-12-20 DIAGNOSIS — I10 Essential (primary) hypertension: Secondary | ICD-10-CM | POA: Diagnosis not present

## 2015-12-20 DIAGNOSIS — D500 Iron deficiency anemia secondary to blood loss (chronic): Secondary | ICD-10-CM | POA: Diagnosis not present

## 2015-12-20 DIAGNOSIS — Z1211 Encounter for screening for malignant neoplasm of colon: Secondary | ICD-10-CM

## 2015-12-20 DIAGNOSIS — R918 Other nonspecific abnormal finding of lung field: Secondary | ICD-10-CM

## 2015-12-20 DIAGNOSIS — E278 Other specified disorders of adrenal gland: Secondary | ICD-10-CM

## 2015-12-20 DIAGNOSIS — Z8639 Personal history of other endocrine, nutritional and metabolic disease: Secondary | ICD-10-CM

## 2015-12-20 DIAGNOSIS — Z114 Encounter for screening for human immunodeficiency virus [HIV]: Secondary | ICD-10-CM

## 2015-12-20 DIAGNOSIS — I82401 Acute embolism and thrombosis of unspecified deep veins of right lower extremity: Secondary | ICD-10-CM

## 2015-12-20 DIAGNOSIS — E538 Deficiency of other specified B group vitamins: Secondary | ICD-10-CM

## 2015-12-20 DIAGNOSIS — I517 Cardiomegaly: Secondary | ICD-10-CM

## 2015-12-20 MED ORDER — CARVEDILOL 12.5 MG PO TABS: 12.5000 mg | ORAL_TABLET | Freq: Two times a day (BID) | ORAL | 1 refills | Status: DC

## 2015-12-20 NOTE — Telephone Encounter (Signed)
Per the request of Dr. Steele Sizer, Patient was informed that a Hemoccult Bld/Stl (3-Cd Home Screen) was mailed to her home address to be completed as soon as possible.

## 2015-12-20 NOTE — Progress Notes (Signed)
Name: Madeline Dominguez   MRN: [id number]    DOB: 1965-11-06   Date:12/20/2015       Progress Note  Subjective  Chief Complaint  Chief Complaint  Patient presents with  . Medication Refill    3 month F/U  . Sleep Apnea    Patient received her CPAP machine and usually gets around 4 hours of sleep a night due to "crazy work shift and hours" laying down for 7 but gets 4 hours total of sleep. Husband states CPAP works wonders with wife's condition  . Hypertension    Swelling in right leg  . Iron Deficiency    Last Iron infusion was told she did not need another one since her Iron levels were up.   . B12 Injection    Has only had one injection, now takes otc pill.    HPI  DVT/PE: patient was seen as an outpatient on 07/25/15 with complaints of right calf pain. Doppler US showed DVT, CT chest showed PE, pulmonary nodules and left adrenal gland nodule. She was admitted that day to [facility]. So far coagulation profile negative. Echo showed no heart strain, mild right ventricular dilation .  She on Eliquis and is tolerating it well, no longer has SOB, she still has a sporadic cough, states right leg gets sore occasionally now, and mild swelling when standing for a prolonged period of time, wearing compression stocking hoses before going to work. She is seeing Dr. Grayland Ormond  B12 deficiency: had B12 injection with hematologist and is currently on supplementation otc, we will recheck labs  Iron deficiency anemia: off ocp because of DVT/PE, she is seeing hematologist and is having iron infusion. She has a long history of anemia ( since birth of her first child 69 years ago ), discussed colonoscopy because of her age. She will see gyn for possible IUD and we will get a hemoccult sample - she did not take it home on her last visit  Adrenal gland nodule: on CT looks benign, but she will see her to Endocrinologist to find out how to monitor/evaluate. She has Elisabeth Most at [facility] for thyroid management.  S/p thyroid ablation   HTN: bp is controlled, no chest pain, or palpation. She denies side effects of Coreg, but bp still slightly elevated on office visits, we will increase dose to 12.5 mg twice daily   OSA: she is not using CPAP , she states she never got the machine. We will make sure she gets set up, CPAP of 11 cmH2O  Obesity: she is gradually gaining weight, she is going to start walking on her treadmill, also discussed change in her diet.   Patient Active Problem List   Diagnosis Date Noted  . Right leg DVT ([place]) 08/01/2015  . Iron deficiency anemia 07/27/2015  . B12 deficiency 07/27/2015  . Bilateral pulmonary embolism ([place]) 07/25/2015  . Adrenal nodule ([place]) 07/25/2015  . Pulmonary nodules/lesions, multiple 07/25/2015  . OSA (obstructive sleep apnea) 05/18/2015  . Thyroid nodule 12/17/2014  . Heavy menstrual period 09/02/2014  . Gastroesophageal reflux disease without esophagitis 09/02/2014  . Extreme obesity ([place]) 06/22/2014  . Large breasts 06/22/2014  . Anemia, iron deficiency 06/22/2014  . Benign hypertension 06/22/2014  . H/O hyperthyroidism 06/22/2014  . LVH (left ventricular hypertrophy) 06/22/2014  . 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    Past Surgical History:  Procedure Laterality Date  . CESAREAN SECTION      Family History  Problem  Relation Age of Onset  . CVA Father     Social History   Social History  . Marital status: Married    Spouse name: N/A  . Number of children: N/A  . Years of education: N/A   Occupational History  . Not on file.   Social History Main Topics  . Smoking status: Never Smoker  . Smokeless tobacco: Never Used  . Alcohol use No     Comment: rarely  . Drug use: No  . Sexual activity: Yes    Partners: Male    Birth control/ protection: Condom   Other Topics Concern  . Not on file   Social History Narrative   Lives at home with husband.     Current  Outpatient Prescriptions:  .  apixaban (ELIQUIS) 5 MG TABS tablet, Twice daily, Disp: 60 tablet, Rfl: 5 .  carvedilol (COREG) 6.25 MG tablet, Take 1 tablet (6.25 mg total) by mouth 2 (two) times daily with a meal., Disp: 180 tablet, Rfl: 0 .  ferrous sulfate 325 (65 FE) MG tablet, Take 1 tablet (325 mg total) by mouth 2 (two) times daily with a meal., Disp: 180 tablet, Rfl: 1 .  vitamin B-12 1000 MCG tablet, Take 1 tablet (1,000 mcg total) by mouth daily., Disp: 30 tablet, Rfl: 0  Allergies  Allergen Reactions  . Aspirin Nausea And Vomiting     ROS  Constitutional: Negative for fever or significant weight change.  Respiratory: Negative for cough and shortness of breath.   Cardiovascular: Negative for chest pain or palpitations.  Gastrointestinal: Negative for abdominal pain, no bowel changes.  Musculoskeletal: Negative for gait problem or joint swelling.  Skin: Negative for rash.  Neurological: Negative for dizziness or headache.  No other specific complaints in a complete review of systems (except as listed in HPI above).   Objective  Vitals:   12/20/15 1130  BP: 138/86  Pulse: 89  Resp: 18  Temp: 99.1 F (37.3 C)  TempSrc: Oral  SpO2: 94%  Weight: (!) 356 lb (161.5 kg)  Height: 5\' 7"  (1.702 m)    Body mass index is 55.76 kg/m.  Physical Exam  Constitutional: Patient appears well-developed and well-nourished. Obese  No distress.  HEENT: head atraumatic, normocephalic, pupils equal and reactive to light,  neck supple, throat within normal limits. No thyromegaly ( s/p ablation ) Cardiovascular: Normal rate, regular rhythm and normal heart sounds.  No murmur heard. trace BLE edema. No calf tenderness Pulmonary/Chest: Effort normal and breath sounds normal. No respiratory distress. Abdominal: Soft.  There is no tenderness. Psychiatric: Patient has a normal mood and affect. behavior is normal. Judgment and thought content normal.  Recent Results (from the past 2160  hour(s))  Ferritin     Status: None   Collection Time: 11/07/15 10:09 AM  Result Value Ref Range   Ferritin 51 11 - 307 ng/mL  Iron and TIBC     Status: None   Collection Time: 11/07/15 10:09 AM  Result Value Ref Range   Iron 46 28 - 170 ug/dL   TIBC 277 250 - 450 ug/dL   Saturation Ratios 17 10.4 - 31.8 %   UIBC 231 ug/dL  CBC with Differential/Platelet     Status: Abnormal   Collection Time: 11/07/15 10:09 AM  Result Value Ref Range   WBC 5.6 3.6 - 11.0 K/uL   RBC 4.03 3.80 - 5.20 MIL/uL   Hemoglobin 11.9 (L) 12.0 - 16.0 g/dL   HCT 35.6 35.0 - 47.0 %  MCV 88.3 80.0 - 100.0 fL   MCH 29.5 26.0 - 34.0 pg   MCHC 33.4 32.0 - 36.0 g/dL   RDW 15.6 (H) 11.5 - 14.5 %   Platelets 207 150 - 440 K/uL   Neutrophils Relative % 50 %   Neutro Abs 2.9 1.4 - 6.5 K/uL   Lymphocytes Relative 39 %   Lymphs Abs 2.2 1.0 - 3.6 K/uL   Monocytes Relative 7 %   Monocytes Absolute 0.4 0.2 - 0.9 K/uL   Eosinophils Relative 3 %   Eosinophils Absolute 0.2 0 - 0.7 K/uL   Basophils Relative 1 %   Basophils Absolute 0.0 0 - 0.1 K/uL      PHQ2/9: Depression screen [facility] 2/9 12/20/2015 09/02/2015 03/04/2015 12/03/2014 09/02/2014  Decreased Interest 0 0 0 0 0  Down, Depressed, Hopeless 0 0 0 0 0  PHQ - 2 Score 0 0 0 0 0     Fall Risk: Fall Risk  12/20/2015 09/02/2015 03/04/2015 12/03/2014 09/02/2014  Falls in the past year? No No No No No    Functional Status Survey: Is the patient deaf or have difficulty hearing?: No Does the patient have difficulty seeing, even when wearing glasses/contacts?: No Does the patient have difficulty concentrating, remembering, or making decisions?: No Does the patient have difficulty walking or climbing stairs?: No Does the patient have difficulty dressing or bathing?: No Does the patient have difficulty doing errands alone such as visiting a doctor's office or shopping?: No   Assessment & Plan   1. Benign hypertension  - COMPLETE METABOLIC PANEL WITH GFR - carvedilol  (COREG) 12.5 MG tablet; Take 1 tablet (12.5 mg total) by mouth 2 (two) times daily with a meal.  Dispense: 180 tablet; Refill: 1  2. Adrenal nodule [facility])  She will follow up with endocrinologist at [facility]  3. Pulmonary nodules/lesions, multiple   4. Iron deficiency anemia due to chronic blood loss  She had a total of 4 iron infusions in 2017 and last Ferritin was up to 51 and hgb up to 11.9 from 7.9, and she is taking her iron pills.  5. Morbid obesity, unspecified obesity type ([place])  - Hemoglobin A1c - Insulin, fasting  6. Deep vein thrombosis (DVT) of right lower extremity, unspecified chronicity, unspecified vein (HCC)  Seeing Dr. Grayland Ormond and may be able to stop Eliquis in Feb/March 2018, she has an appointment scheduled for January 2018  7. B12 deficiency  - Vitamin B12  8. History of toxic multinodular goiter  Continue follow up with endocrinologist. Dr. Elisabeth Most  9. Right ventricular dilation   - carvedilol (COREG) 12.5 MG tablet; Take 1 tablet (12.5 mg total) by mouth 2 (two) times daily with a meal.  Dispense: 180 tablet; Refill: 1  10. Encounter for screening for HIV  - HIV antibody  11. OSA (obstructive sleep apnea)  Continue wearing CPAP for at least 4 hours per night

## 2015-12-21 LAB — COMPLETE METABOLIC PANEL WITH GFR
ALBUMIN: 3.4 g/dL — AB (ref 3.6–5.1)
ALT: 6 U/L (ref 6–29)
AST: 10 U/L (ref 10–35)
Alkaline Phosphatase: 69 U/L (ref 33–115)
BILIRUBIN TOTAL: 0.3 mg/dL (ref 0.2–1.2)
BUN: 11 mg/dL (ref 7–25)
CO2: 27 mmol/L (ref 20–31)
CREATININE: 0.75 mg/dL (ref 0.50–1.10)
Calcium: 8.9 mg/dL (ref 8.6–10.2)
Chloride: 108 mmol/L (ref 98–110)
GFR, Est Non African American: >89 mL/min (ref >=60)
GLUCOSE: 89 mg/dL (ref 65–99)
Potassium: 4.2 mmol/L (ref 3.5–5.3)
Sodium: 143 mmol/L (ref 135–146)
TOTAL PROTEIN: 7 g/dL (ref 6.1–8.1)

## 2015-12-21 LAB — VITAMIN B12: Vitamin B-12: 1264 pg/mL — ABNORMAL HIGH (ref 200–1100)

## 2015-12-21 LAB — INSULIN, FASTING: Insulin fasting, serum: 71 u[IU]/mL — ABNORMAL HIGH (ref 2.0–19.6)

## 2015-12-21 LAB — HEMOGLOBIN A1C
Hgb A1c MFr Bld: 4.9 % (ref <5.7)
MEAN PLASMA GLUCOSE: 94 mg/dL

## 2015-12-21 LAB — HIV ANTIBODY (ROUTINE TESTING W REFLEX): HIV 1&2 Ab, 4th Generation: NONREACTIVE

## 2016-01-28 DIAGNOSIS — R0683 Snoring: Secondary | ICD-10-CM | POA: Diagnosis not present

## 2016-01-28 DIAGNOSIS — G4733 Obstructive sleep apnea (adult) (pediatric): Secondary | ICD-10-CM | POA: Diagnosis not present

## 2016-02-06 ENCOUNTER — Other Ambulatory Visit: Payer: Self-pay | Admitting: *Deleted

## 2016-02-06 DIAGNOSIS — D509 Iron deficiency anemia, unspecified: Secondary | ICD-10-CM

## 2016-02-06 NOTE — Progress Notes (Signed)
Madeline Dominguez  Telephone:(336) [phone] Fax:(336) [phone]  ID: [name] OB: 11/22/1965  MR#: [id number]  [id number]  Patient Care Team: Steele Sizer, MD as PCP - General (Family Medicine)  CHIEF COMPLAINT: Bilateral pulmonary embolism, iron deficiency anemia.  INTERVAL HISTORY: Patient returns to clinic today for repeat laboratory work and further evaluation. She currently feels well and is asymptomatic. She has now completed 6 months of Eliquis. She has no neurologic complaints. She denies any recent fevers. She denies any chest pain or shortness of breath. She has no nausea, vomiting, constipation, or diarrhea. She has no melena or hematochezia. She has no urinary complaints. Patient offers no specific complaints today.  REVIEW OF SYSTEMS:   Review of Systems  Constitutional: Negative.  Negative for fever, malaise/fatigue and weight loss.  Respiratory: Negative.  Negative for cough, hemoptysis and shortness of breath.   Cardiovascular: Negative.  Negative for chest pain and leg swelling.  Gastrointestinal: Negative.  Negative for abdominal pain, blood in stool and melena.  Genitourinary: Negative.   Musculoskeletal: Negative.   Neurological: Negative.  Negative for weakness.  Psychiatric/Behavioral: Negative.  The patient is not nervous/anxious.     As per HPI. Otherwise, a complete review of systems is negative.  PAST MEDICAL HISTORY: Past Medical History:  Diagnosis Date  . Cough 07/25/2015  . Dilated cardiomyopathy ([place])   . H/O: hypothyroidism   . Heart palpitations   . Hypertension   . Iron deficiency   . Large breasts   . Morbid obesity ([place])   . Postpartum hemorrhage   . Thyroid disease     PAST SURGICAL HISTORY: Past Surgical History:  Procedure Laterality Date  . CESAREAN SECTION      FAMILY HISTORY: Family History  Problem Relation Age of Onset  . CVA Father        ADVANCED DIRECTIVES:    HEALTH MAINTENANCE: Social  History  Substance Use Topics  . Smoking status: Never Smoker  . Smokeless tobacco: Never Used  . Alcohol use No     Comment: rarely     Colonoscopy:  PAP:  Bone density:  Lipid panel:  Allergies  Allergen Reactions  . Aspirin Nausea And Vomiting    Current Outpatient Prescriptions  Medication Sig Dispense Refill  . apixaban (ELIQUIS) 5 MG TABS tablet Twice daily (Patient taking differently: Take 5 mg by mouth 2 (two) times daily. Twice daily) 60 tablet 5  . carvedilol (COREG) 12.5 MG tablet Take 1 tablet (12.5 mg total) by mouth 2 (two) times daily with a meal. 180 tablet 1  . ferrous sulfate 325 (65 FE) MG tablet Take 1 tablet (325 mg total) by mouth 2 (two) times daily with a meal. 180 tablet 1  . vitamin B-12 1000 MCG tablet Take 1 tablet (1,000 mcg total) by mouth daily. 30 tablet 0   No current facility-administered medications for this visit.     OBJECTIVE: Vitals:   02/07/16 1448  BP: (!) 148/93  Pulse: 67  Resp: 18  Temp: 98.9 F (37.2 C)     Body mass index is 56.28 kg/m.    ECOG FS:0 - Asymptomatic  General: Well-developed, well-nourished, no acute distress. Eyes: Pink conjunctiva, anicteric sclera. Lungs: Clear to auscultation bilaterally. Heart: Regular rate and rhythm. No rubs, murmurs, or gallops. Abdomen: Soft, nontender, nondistended. No organomegaly noted, normoactive bowel sounds. Musculoskeletal: No edema, cyanosis, or clubbing. Neuro: Alert, answering all questions appropriately. Cranial nerves grossly intact. Skin: No rashes or petechiae noted. Psych: Normal affect.  LAB RESULTS:  Lab Results  Component Value Date   NA 143 12/20/2015   K 4.2 12/20/2015   CL 108 12/20/2015   CO2 27 12/20/2015   GLUCOSE 89 12/20/2015   BUN 11 12/20/2015   CREATININE 0.75 12/20/2015   CALCIUM 8.9 12/20/2015   PROT 7.0 12/20/2015   ALBUMIN 3.4 (L) 12/20/2015   AST 10 12/20/2015   ALT 6 12/20/2015   ALKPHOS 69 12/20/2015   BILITOT 0.3 12/20/2015    GFRNONAA >89 12/20/2015   GFRAA >89 12/20/2015    Lab Results  Component Value Date   WBC 7.6 02/07/2016   NEUTROABS 4.3 02/07/2016   HGB 10.8 (L) 02/07/2016   HCT 32.8 (L) 02/07/2016   MCV 89.7 02/07/2016   PLT 281 02/07/2016   Lab Results  Component Value Date   IRON 29 02/07/2016   TIBC 320 02/07/2016   IRONPCTSAT 9 (L) 02/07/2016   Lab Results  Component Value Date   FERRITIN 12 02/07/2016     STUDIES: No results found.  ASSESSMENT: Bilateral pulmonary embolism, iron deficiency anemia.  PLAN:    1. Bilateral pulmonary embolism: Patient's entire hypercoagulable workup was negative. CT scan results reviewed independently. Her PE was likely secondary to birth control pills. These have been discontinued. Patient has now completed 6 months of Eliquis. Patient was also previously educated on additional transient risk factors for blood clots. No further intervention is needed at this time. 2. Iron deficiency anemia: Patient's hemoglobin and iron stores have trended down slightly, but she is currently asymptomatic. She does not wish to have IV iron at this time. Return to clinic in 3 months with repeat laboratory work and further evaluation. Patient will likely require treatment at her next clinic visit.  3. Hypertension: Patient's blood pressure is mildly elevated today. Continue current medications. Treatment per primary care.  Patient expressed understanding and was in agreement with this plan. She also understands that She can call clinic at any time with any questions, concerns, or complaints.    Lloyd Huger, MD   02/12/2016 7:51 AM

## 2016-02-07 ENCOUNTER — Inpatient Hospital Stay: Payer: 59 | Attending: Oncology

## 2016-02-07 ENCOUNTER — Inpatient Hospital Stay: Payer: 59

## 2016-02-07 ENCOUNTER — Inpatient Hospital Stay (HOSPITAL_BASED_OUTPATIENT_CLINIC_OR_DEPARTMENT_OTHER): Payer: 59 | Admitting: Oncology

## 2016-02-07 VITALS — BP 148/93 | HR 67 | Temp 98.9°F | Resp 18 | Wt 359.4 lb

## 2016-02-07 DIAGNOSIS — D509 Iron deficiency anemia, unspecified: Secondary | ICD-10-CM

## 2016-02-07 DIAGNOSIS — Z79899 Other long term (current) drug therapy: Secondary | ICD-10-CM | POA: Diagnosis not present

## 2016-02-07 DIAGNOSIS — E669 Obesity, unspecified: Secondary | ICD-10-CM | POA: Diagnosis not present

## 2016-02-07 DIAGNOSIS — R002 Palpitations: Secondary | ICD-10-CM

## 2016-02-07 DIAGNOSIS — E039 Hypothyroidism, unspecified: Secondary | ICD-10-CM

## 2016-02-07 DIAGNOSIS — Z7901 Long term (current) use of anticoagulants: Secondary | ICD-10-CM

## 2016-02-07 DIAGNOSIS — Z86711 Personal history of pulmonary embolism: Secondary | ICD-10-CM | POA: Diagnosis not present

## 2016-02-07 DIAGNOSIS — I10 Essential (primary) hypertension: Secondary | ICD-10-CM | POA: Diagnosis not present

## 2016-02-07 DIAGNOSIS — R05 Cough: Secondary | ICD-10-CM

## 2016-02-07 DIAGNOSIS — D500 Iron deficiency anemia secondary to blood loss (chronic): Secondary | ICD-10-CM

## 2016-02-07 DIAGNOSIS — I420 Dilated cardiomyopathy: Secondary | ICD-10-CM | POA: Diagnosis not present

## 2016-02-07 DIAGNOSIS — I2699 Other pulmonary embolism without acute cor pulmonale: Secondary | ICD-10-CM

## 2016-02-07 LAB — CBC WITH DIFFERENTIAL/PLATELET
BASOS ABS: 0.1 10*3/uL (ref 0–0.1)
Basophils Relative: 1 %
EOS ABS: 0.2 10*3/uL (ref 0–0.7)
EOS PCT: 2 %
HCT: 32.8 % — ABNORMAL LOW (ref 35.0–47.0)
Hemoglobin: 10.8 g/dL — ABNORMAL LOW (ref 12.0–16.0)
LYMPHS ABS: 2.4 10*3/uL (ref 1.0–3.6)
Lymphocytes Relative: 32 %
MCH: 29.4 pg (ref 26.0–34.0)
MCHC: 32.8 g/dL (ref 32.0–36.0)
MCV: 89.7 fL (ref 80.0–100.0)
Monocytes Absolute: 0.6 10*3/uL (ref 0.2–0.9)
Monocytes Relative: 8 %
Neutro Abs: 4.3 10*3/uL (ref 1.4–6.5)
Neutrophils Relative %: 57 %
PLATELETS: 281 10*3/uL (ref 150–440)
RBC: 3.66 MIL/uL — AB (ref 3.80–5.20)
RDW: 14.6 % — ABNORMAL HIGH (ref 11.5–14.5)
WBC: 7.6 10*3/uL (ref 3.6–11.0)

## 2016-02-07 LAB — IRON AND TIBC
Iron: 29 ug/dL (ref 28–170)
SATURATION RATIOS: 9 % — AB (ref 10.4–31.8)
TIBC: 320 ug/dL (ref 250–450)
UIBC: 291 ug/dL

## 2016-02-07 LAB — FERRITIN: Ferritin: 12 ng/mL (ref 11–307)

## 2016-02-07 NOTE — Progress Notes (Signed)
Patient is here for follow up, she is doing well 

## 2016-02-20 ENCOUNTER — Other Ambulatory Visit: Payer: Self-pay | Admitting: Family Medicine

## 2016-02-20 MED ORDER — APIXABAN 5 MG PO TABS: 5.0000 mg | ORAL_TABLET | Freq: Two times a day (BID) | ORAL | 0 refills | Status: DC

## 2016-02-20 NOTE — Telephone Encounter (Signed)
PT HAS ONLY ONE DOSE LEFT OF HER ELIQUIS. SHE NEEDS REFILL . HAS CPE APPT IN [facility]. [address] IS [address]

## 2016-02-28 DIAGNOSIS — G4733 Obstructive sleep apnea (adult) (pediatric): Secondary | ICD-10-CM | POA: Diagnosis not present

## 2016-02-28 DIAGNOSIS — R0683 Snoring: Secondary | ICD-10-CM | POA: Diagnosis not present

## 2016-03-13 ENCOUNTER — Encounter: Payer: 59 | Admitting: Family Medicine

## 2016-03-22 ENCOUNTER — Other Ambulatory Visit: Payer: Self-pay

## 2016-03-22 MED ORDER — APIXABAN 5 MG PO TABS: 5.0000 mg | ORAL_TABLET | Freq: Two times a day (BID) | ORAL | 0 refills | Status: DC

## 2016-03-23 ENCOUNTER — Ambulatory Visit (INDEPENDENT_AMBULATORY_CARE_PROVIDER_SITE_OTHER): Payer: 59 | Admitting: Family Medicine

## 2016-03-23 ENCOUNTER — Other Ambulatory Visit: Payer: Self-pay | Admitting: Family Medicine

## 2016-03-23 ENCOUNTER — Encounter: Payer: Self-pay | Admitting: Family Medicine

## 2016-03-23 VITALS — BP 138/88 | HR 90 | Temp 98.6°F | Resp 18 | Ht 67.0 in | Wt 359.2 lb

## 2016-03-23 DIAGNOSIS — Z1211 Encounter for screening for malignant neoplasm of colon: Secondary | ICD-10-CM | POA: Diagnosis not present

## 2016-03-23 DIAGNOSIS — D500 Iron deficiency anemia secondary to blood loss (chronic): Secondary | ICD-10-CM | POA: Diagnosis not present

## 2016-03-23 DIAGNOSIS — Z1231 Encounter for screening mammogram for malignant neoplasm of breast: Secondary | ICD-10-CM | POA: Diagnosis not present

## 2016-03-23 DIAGNOSIS — Z124 Encounter for screening for malignant neoplasm of cervix: Secondary | ICD-10-CM | POA: Diagnosis not present

## 2016-03-23 DIAGNOSIS — Z1239 Encounter for other screening for malignant neoplasm of breast: Secondary | ICD-10-CM

## 2016-03-23 DIAGNOSIS — Z01419 Encounter for gynecological examination (general) (routine) without abnormal findings: Secondary | ICD-10-CM

## 2016-03-23 DIAGNOSIS — N951 Menopausal and female climacteric states: Secondary | ICD-10-CM

## 2016-03-23 NOTE — Progress Notes (Signed)
Name: Madeline Dominguez   MRN: [id number]    DOB: 1965-04-08   Date:03/23/2016       Progress Note  Subjective  Chief Complaint  Chief Complaint  Patient presents with  . Annual Exam    HPI  Well Woman: she is married, cycles have been irregular, skipped 3 months followed cycles for 2 months and skipped this last month. She has noticed vaginal dryness during intercourse, no vaginal discharge, she has hot flashes and night sweats, she has noticed some stress incontinence, no breast lumps. No change in bowel movements.     Patient Active Problem List   Diagnosis Date Noted  . Right leg DVT ([place]) 08/01/2015  . Iron deficiency anemia 07/27/2015  . B12 deficiency 07/27/2015  . Bilateral pulmonary embolism ([place]) 07/25/2015  . Adrenal nodule ([place]) 07/25/2015  . Pulmonary nodules/lesions, multiple 07/25/2015  . OSA (obstructive sleep apnea) 05/18/2015  . Thyroid nodule 12/17/2014  . Heavy menstrual period 09/02/2014  . Gastroesophageal reflux disease without esophagitis 09/02/2014  . Extreme obesity ([place]) 06/22/2014  . Large breasts 06/22/2014  . Anemia, iron deficiency 06/22/2014  . Benign hypertension 06/22/2014  . H/O hyperthyroidism 06/22/2014  . Right ventricular dilation 06/22/2014  . 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    Past Surgical History:  Procedure Laterality Date  . CESAREAN SECTION      Family History  Problem Relation Age of Onset  . CVA Father     Social History   Social History  . Marital status: Married    Spouse name: N/A  . Number of children: N/A  . Years of education: N/A   Occupational History  . Not on file.   Social History Main Topics  . Smoking status: Never Smoker  . Smokeless tobacco: Never Used  . Alcohol use No     Comment: rarely  . Drug use: No  . Sexual activity: Yes    Partners: Male    Birth control/ protection: Condom   Other Topics Concern  . Not on file   Social  History Narrative   Lives at home with husband.     Current Outpatient Prescriptions:  .  apixaban (ELIQUIS) 5 MG TABS tablet, Take 1 tablet (5 mg total) by mouth 2 (two) times daily. Twice daily, Disp: 60 tablet, Rfl: 0 .  carvedilol (COREG) 12.5 MG tablet, Take 1 tablet (12.5 mg total) by mouth 2 (two) times daily with a meal., Disp: 180 tablet, Rfl: 1 .  ferrous sulfate 325 (65 FE) MG tablet, Take 1 tablet (325 mg total) by mouth 2 (two) times daily with a meal., Disp: 180 tablet, Rfl: 1 .  vitamin B-12 1000 MCG tablet, Take 1 tablet (1,000 mcg total) by mouth daily., Disp: 30 tablet, Rfl: 0  Allergies  Allergen Reactions  . Aspirin Nausea And Vomiting     ROS  Constitutional: Negative for fever or weight change.  Respiratory: Negative for cough and shortness of breath.   Cardiovascular: Negative for chest pain or palpitations.  Gastrointestinal: Negative for abdominal pain, no bowel changes.  Musculoskeletal: Negative for gait problem or joint swelling. leg cramps occasionally at night ( advised fluids, stretching and return if no improvement)  Skin: Negative for rash.  Neurological: Negative for dizziness or headache.  No other specific complaints in a complete review of systems (except as listed in HPI above).  Objective  Vitals:   03/23/16 1516  BP: 138/88  Pulse: 90  Resp: 18  Temp: 98.6 F (37 C)  SpO2: 93%  Weight: (!) 359 lb 4 oz (163 kg)  Height: 5\' 7"  (1.702 m)    Body mass index is 56.27 kg/m.  Physical Exam  Constitutional: Patient appears well-developed and morbid obesity. No distress.  HENT: Head: Normocephalic and atraumatic. Ears: B TMs ok, no erythema or effusion; Nose: Nose normal. Mouth/Throat: Oropharynx is clear and moist. No oropharyngeal exudate.  Eyes: Conjunctivae and EOM are normal. Pupils are equal, round, and reactive to light. No scleral icterus.  Neck: Normal range of motion. Neck supple. No JVD present. No thyromegaly present.   Cardiovascular: Normal rate, regular rhythm and normal heart sounds.  No murmur heard. Trace  BLE edema. Pulmonary/Chest: Effort normal and breath sounds normal. No respiratory distress. Abdominal: Soft. Bowel sounds are normal, no distension. There is no tenderness. no masses Breast: no lumps or masses, no nipple discharge or rashes FEMALE GENITALIA:  External genitalia normal External urethra normal Vaginal vault normal without discharge or lesions Cervix normal without discharge or lesions Bimanual exam normal without masses RECTAL: not done Musculoskeletal: Normal range of motion, no joint effusions. No gross deformities Neurological: he is alert and oriented to person, place, and time. No cranial nerve deficit. Coordination, balance, strength, speech and gait are normal.  Skin: Skin is warm and dry. No rash noted. No erythema.  Psychiatric: Patient has a normal mood and affect. behavior is normal. Judgment and thought content normal.  Recent Results (from the past 2160 hour(s))  CBC with Differential     Status: Abnormal   Collection Time: 02/07/16  1:40 PM  Result Value Ref Range   WBC 7.6 3.6 - 11.0 K/uL   RBC 3.66 (L) 3.80 - 5.20 MIL/uL   Hemoglobin 10.8 (L) 12.0 - 16.0 g/dL   HCT 32.8 (L) 35.0 - 47.0 %   MCV 89.7 80.0 - 100.0 fL   MCH 29.4 26.0 - 34.0 pg   MCHC 32.8 32.0 - 36.0 g/dL   RDW 14.6 (H) 11.5 - 14.5 %   Platelets 281 150 - 440 K/uL   Neutrophils Relative % 57 %   Neutro Abs 4.3 1.4 - 6.5 K/uL   Lymphocytes Relative 32 %   Lymphs Abs 2.4 1.0 - 3.6 K/uL   Monocytes Relative 8 %   Monocytes Absolute 0.6 0.2 - 0.9 K/uL   Eosinophils Relative 2 %   Eosinophils Absolute 0.2 0 - 0.7 K/uL   Basophils Relative 1 %   Basophils Absolute 0.1 0 - 0.1 K/uL  Ferritin     Status: None   Collection Time: 02/07/16  1:40 PM  Result Value Ref Range   Ferritin 12 11 - 307 ng/mL  Iron and TIBC     Status: Abnormal   Collection Time: 02/07/16  1:40 PM  Result Value Ref  Range   Iron 29 28 - 170 ug/dL   TIBC 320 250 - 450 ug/dL   Saturation Ratios 9 (L) 10.4 - 31.8 %   UIBC 291 ug/dL      PHQ2/9: Depression screen [facility] 2/9 12/20/2015 09/02/2015 03/04/2015 12/03/2014 09/02/2014  Decreased Interest 0 0 0 0 0  Down, Depressed, Hopeless 0 0 0 0 0  PHQ - 2 Score 0 0 0 0 0     Fall Risk: Fall Risk  12/20/2015 09/02/2015 03/04/2015 12/03/2014 09/02/2014  Falls in the past year? No No No No No    Assessment & Plan  1. Well woman exam  Discussed importance of 150 minutes  of physical activity weekly, eat two servings of fish weekly, eat one serving of tree nuts ( cashews, pistachios, pecans, almonds.Marland Kitchen) every other day, eat 6 servings of fruit/vegetables daily and drink plenty of water and avoid sweet beverages.   - Hemoglobin A1c - Insulin, fasting - Lipid panel - COMPLETE METABOLIC PANEL WITH GFR  2. Encounter for screening for cervical cancer   - Pap IG and HPV (high risk) DNA detection  3. Perimenopause  Cycles are irregular  4. Breast cancer screening  - MM Digital Screening; Future  5. Colon cancer screening  Discussed having colonoscopy for evaluation of anemia, but she is afraid, she will bring hemoccult cards  6. Iron deficiency anemia due to chronic blood loss  She forgot to bring hemoccult cards

## 2016-03-27 ENCOUNTER — Encounter: Payer: Self-pay | Admitting: Family Medicine

## 2016-03-27 DIAGNOSIS — B977 Papillomavirus as the cause of diseases classified elsewhere: Secondary | ICD-10-CM | POA: Insufficient documentation

## 2016-03-27 DIAGNOSIS — G4733 Obstructive sleep apnea (adult) (pediatric): Secondary | ICD-10-CM | POA: Diagnosis not present

## 2016-03-27 DIAGNOSIS — R0683 Snoring: Secondary | ICD-10-CM | POA: Diagnosis not present

## 2016-03-27 LAB — PAP IG AND HPV HIGH-RISK: HPV DNA High Risk: DETECTED — AB

## 2016-04-19 ENCOUNTER — Other Ambulatory Visit: Payer: Self-pay | Admitting: Family Medicine

## 2016-04-19 NOTE — Telephone Encounter (Signed)
Pt needs refill on Eliquis. Pt has an appt next week. [place]

## 2016-04-20 ENCOUNTER — Other Ambulatory Visit: Payer: Self-pay | Admitting: Family Medicine

## 2016-04-23 ENCOUNTER — Other Ambulatory Visit: Payer: Self-pay

## 2016-04-23 NOTE — Telephone Encounter (Signed)
Patient requesting refill of Eliquis to [facility].

## 2016-04-23 NOTE — Telephone Encounter (Signed)
Call pharmacy, patient is no longer supposed to be on Eliquis, I have refused medication many times because it was supposed to be stopped Feb/March 2018 per her hematologist

## 2016-04-24 ENCOUNTER — Ambulatory Visit: Payer: 59 | Admitting: Family Medicine

## 2016-04-26 ENCOUNTER — Telehealth: Payer: Self-pay | Admitting: *Deleted

## 2016-04-26 NOTE — Telephone Encounter (Signed)
CAlled to report that she had a "severe menstrual cycle this month" and now she is feeling weak and SOB, Asking if she can get her iron infusion early, it is scheduled for 05/07/16. Her last infusion was in February. Please advise

## 2016-04-26 NOTE — Telephone Encounter (Signed)
OK to move appt to Monday 4/30 per Dr Grayland Ormond, message sent to schedulers. Left message for patient on her VM regarding appt move and that scheduler will call her with time

## 2016-04-27 ENCOUNTER — Other Ambulatory Visit: Payer: Self-pay | Admitting: Family Medicine

## 2016-04-27 DIAGNOSIS — R0683 Snoring: Secondary | ICD-10-CM | POA: Diagnosis not present

## 2016-04-27 DIAGNOSIS — G4733 Obstructive sleep apnea (adult) (pediatric): Secondary | ICD-10-CM | POA: Diagnosis not present

## 2016-04-28 LAB — COMPLETE METABOLIC PANEL WITH GFR
ALBUMIN: 3.5 g/dL — AB (ref 3.6–5.1)
ALT: 10 U/L (ref 6–29)
AST: 11 U/L (ref 10–35)
Alkaline Phosphatase: 64 U/L (ref 33–115)
BUN: 9 mg/dL (ref 7–25)
CALCIUM: 8.6 mg/dL (ref 8.6–10.2)
CHLORIDE: 105 mmol/L (ref 98–110)
CO2: 31 mmol/L (ref 20–31)
CREATININE: 0.68 mg/dL (ref 0.50–1.10)
GFR, Est African American: >89 mL/min (ref >=60)
GFR, Est Non African American: >89 mL/min (ref >=60)
Glucose, Bld: 104 mg/dL — ABNORMAL HIGH (ref 65–99)
POTASSIUM: 4.2 mmol/L (ref 3.5–5.3)
Sodium: 141 mmol/L (ref 135–146)
Total Bilirubin: 0.2 mg/dL (ref 0.2–1.2)
Total Protein: 6.6 g/dL (ref 6.1–8.1)

## 2016-04-28 LAB — LIPID PANEL
CHOLESTEROL: 178 mg/dL (ref <200)
HDL: 68 mg/dL (ref >50)
LDL Cholesterol: 99 mg/dL (ref <100)
TRIGLYCERIDES: 53 mg/dL (ref <150)
Total CHOL/HDL Ratio: 2.6 Ratio (ref <5.0)
VLDL: 11 mg/dL (ref <30)

## 2016-04-28 LAB — HEMOGLOBIN A1C
Hgb A1c MFr Bld: 4.9 % (ref <5.7)
MEAN PLASMA GLUCOSE: 94 mg/dL

## 2016-04-29 NOTE — Progress Notes (Deleted)
[place]  [phone] [phone]  ID: [name] OB: 10/04/1965  MR#: [id number]  DPO#:[id number]  Patient Care Team: Steele Sizer, MD as PCP - General (Family Medicine)  CHIEF COMPLAINT: Bilateral pulmonary embolism, iron deficiency anemia.  INTERVAL HISTORY: Patient returns to clinic today for repeat laboratory work and further evaluation. She currently feels well and is asymptomatic. She has now completed 6 months of Eliquis. She has no neurologic complaints. She denies any recent fevers. She denies any chest pain or shortness of breath. She has no nausea, vomiting, constipation, or diarrhea. She has no melena or hematochezia. She has no urinary complaints. Patient offers no specific complaints today.  REVIEW OF SYSTEMS:   Review of Systems  Constitutional: Negative.  Negative for fever, malaise/fatigue and weight loss.  Respiratory: Negative.  Negative for cough, hemoptysis and shortness of breath.   Cardiovascular: Negative.  Negative for chest pain and leg swelling.  Gastrointestinal: Negative.  Negative for abdominal pain, blood in stool and melena.  Genitourinary: Negative.   Musculoskeletal: Negative.   Neurological: Negative.  Negative for weakness.  Psychiatric/Behavioral: Negative.  The patient is not nervous/anxious.     As per HPI. Otherwise, a complete review of systems is negative.  PAST MEDICAL HISTORY: Past Medical History:  Diagnosis Date  . Cough 07/25/2015  . Dilated cardiomyopathy ([place])   . H/O: hypothyroidism   . Heart palpitations   . Hypertension   . Iron deficiency   . Large breasts   . Morbid obesity ([place])   . Postpartum hemorrhage   . Thyroid disease     PAST SURGICAL HISTORY: Past Surgical History:  Procedure Laterality Date  . CESAREAN SECTION      FAMILY HISTORY: Family History  Problem Relation Age of Onset  . CVA Father        ADVANCED DIRECTIVES:    HEALTH MAINTENANCE: Social  History  Substance Use Topics  . Smoking status: Never Smoker  . Smokeless tobacco: Never Used  . Alcohol use No     Comment: rarely     Colonoscopy:  PAP:  Bone density:  Lipid panel:  Allergies  Allergen Reactions  . Aspirin Nausea And Vomiting    Current Outpatient Prescriptions  Medication Sig Dispense Refill  . carvedilol (COREG) 12.5 MG tablet Take 1 tablet (12.5 mg total) by mouth 2 (two) times daily with a meal. 180 tablet 1  . ELIQUIS 5 MG TABS tablet     . ferrous sulfate 325 (65 FE) MG tablet Take 1 tablet (325 mg total) by mouth 2 (two) times daily with a meal. 180 tablet 1  . vitamin B-12 1000 MCG tablet Take 1 tablet (1,000 mcg total) by mouth daily. 30 tablet 0   No current facility-administered medications for this visit.     OBJECTIVE: There were no vitals filed for this visit.   There is no height or weight on file to calculate BMI.    ECOG FS:0 - Asymptomatic  General: Well-developed, well-nourished, no acute distress. Eyes: Pink conjunctiva, anicteric sclera. Lungs: Clear to auscultation bilaterally. Heart: Regular rate and rhythm. No rubs, murmurs, or gallops. Abdomen: Soft, nontender, nondistended. No organomegaly noted, normoactive bowel sounds. Musculoskeletal: No edema, cyanosis, or clubbing. Neuro: Alert, answering all questions appropriately. Cranial nerves grossly intact. Skin: No rashes or petechiae noted. Psych: Normal affect.   LAB RESULTS:  Lab Results  Component Value Date   NA 143 12/20/2015   K 4.2 12/20/2015   CL 108 12/20/2015  CO2 27 12/20/2015   GLUCOSE 89 12/20/2015   BUN 11 12/20/2015   CREATININE 0.75 12/20/2015   CALCIUM 8.9 12/20/2015   PROT 7.0 12/20/2015   ALBUMIN 3.4 (L) 12/20/2015   AST 10 12/20/2015   ALT 6 12/20/2015   ALKPHOS 69 12/20/2015   BILITOT 0.3 12/20/2015   GFRNONAA >89 12/20/2015   GFRAA >89 12/20/2015    Lab Results  Component Value Date   WBC 7.6 02/07/2016   NEUTROABS 4.3 02/07/2016    HGB 10.8 (L) 02/07/2016   HCT 32.8 (L) 02/07/2016   MCV 89.7 02/07/2016   PLT 281 02/07/2016   Lab Results  Component Value Date   IRON 29 02/07/2016   TIBC 320 02/07/2016   IRONPCTSAT 9 (L) 02/07/2016   Lab Results  Component Value Date   FERRITIN 12 02/07/2016     STUDIES: No results found.  ASSESSMENT: Bilateral pulmonary embolism, iron deficiency anemia.  PLAN:    1. Bilateral pulmonary embolism: Patient's entire hypercoagulable workup was negative. CT scan results reviewed independently. Her PE was likely secondary to birth control pills. These have been discontinued. Patient has now completed 6 months of Eliquis. Patient was also previously educated on additional transient risk factors for blood clots. No further intervention is needed at this time. 2. Iron deficiency anemia: Patient's hemoglobin and iron stores have trended down slightly, but she is currently asymptomatic. She does not wish to have IV iron at this time. Return to clinic in 3 months with repeat laboratory work and further evaluation. Patient will likely require treatment at her next clinic visit.  3. Hypertension: Patient's blood pressure is mildly elevated today. Continue current medications. Treatment per primary care.  Patient expressed understanding and was in agreement with this plan. She also understands that She can call clinic at any time with any questions, concerns, or complaints.    Lloyd Huger, MD   04/29/2016 6:20 PM

## 2016-04-30 ENCOUNTER — Inpatient Hospital Stay: Payer: 59

## 2016-04-30 ENCOUNTER — Inpatient Hospital Stay: Payer: 59 | Admitting: Oncology

## 2016-04-30 LAB — INSULIN, FASTING: INSULIN FASTING, SERUM: 10.3 u[IU]/mL (ref 2.0–19.6)

## 2016-05-02 ENCOUNTER — Ambulatory Visit: Payer: 59 | Admitting: Family Medicine

## 2016-05-02 NOTE — Progress Notes (Signed)
[name]  [phone] [phone]  ID: Madeline Dominguez OB: 1965/12/20  MR#: [id number]  FOY#:[id number]  Patient Care Team: Madeline Sizer, MD as PCP - General (Family Medicine)  CHIEF COMPLAINT: Bilateral pulmonary embolism, iron deficiency anemia.  INTERVAL HISTORY: Patient returns to clinic today for repeat laboratory work and further evaluation. She has noted increasing weakness and fatigue over the past several months. She otherwise feels well and is asymptomatic. She has no neurologic complaints. She denies any recent fevers. She denies any chest pain or shortness of breath. She has no nausea, vomiting, constipation, or diarrhea. She has no melena or hematochezia. She has no urinary complaints. Patient offers no further specific complaints today.  REVIEW OF SYSTEMS:   Review of Systems  Constitutional: Positive for malaise/fatigue. Negative for fever and weight loss.  Respiratory: Negative.  Negative for cough, hemoptysis and shortness of breath.   Cardiovascular: Negative.  Negative for chest pain and leg swelling.  Gastrointestinal: Negative.  Negative for abdominal pain, blood in stool and melena.  Genitourinary: Negative.   Musculoskeletal: Negative.   Neurological: Positive for weakness.  Psychiatric/Behavioral: Negative.  The patient is not nervous/anxious.     As per HPI. Otherwise, a complete review of systems is negative.  PAST MEDICAL HISTORY: Past Medical History:  Diagnosis Date  . Cough 07/25/2015  . Dilated cardiomyopathy ([place])   . H/O: hypothyroidism   . Heart palpitations   . Hypertension   . Iron deficiency   . Large breasts   . Morbid obesity ([place])   . Postpartum hemorrhage   . Thyroid disease     PAST SURGICAL HISTORY: Past Surgical History:  Procedure Laterality Date  . CESAREAN SECTION      FAMILY HISTORY: Family History  Problem Relation Age of Onset  . CVA Father        ADVANCED DIRECTIVES:     HEALTH MAINTENANCE: Social History  Substance Use Topics  . Smoking status: Never Smoker  . Smokeless tobacco: Never Used  . Alcohol use No     Comment: rarely     Colonoscopy:  PAP:  Bone density:  Lipid panel:  Allergies  Allergen Reactions  . Aspirin Nausea And Vomiting    Current Outpatient Prescriptions  Medication Sig Dispense Refill  . carvedilol (COREG) 12.5 MG tablet Take 1 tablet (12.5 mg total) by mouth 2 (two) times daily with a meal. 180 tablet 1  . ferrous sulfate 325 (65 FE) MG tablet Take 1 tablet (325 mg total) by mouth 2 (two) times daily with a meal. 180 tablet 1  . vitamin B-12 1000 MCG tablet Take 1 tablet (1,000 mcg total) by mouth daily. 30 tablet 0  . ELIQUIS 5 MG TABS tablet      No current facility-administered medications for this visit.     OBJECTIVE: Vitals:   05/03/16 1134  BP: 134/84  Pulse: 69  Resp: 16  Temp: 97.6 F (36.4 C)     Body mass index is 56.32 kg/m.    ECOG FS:0 - Asymptomatic  General: Well-developed, well-nourished, no acute distress. Eyes: Pink conjunctiva, anicteric sclera. Lungs: Clear to auscultation bilaterally. Heart: Regular rate and rhythm. No rubs, murmurs, or gallops. Abdomen: Soft, nontender, nondistended. No organomegaly noted, normoactive bowel sounds. Musculoskeletal: No edema, cyanosis, or clubbing. Neuro: Alert, answering all questions appropriately. Cranial nerves grossly intact. Skin: No rashes or petechiae noted. Psych: Normal affect.   LAB RESULTS:  Lab Results  Component Value Date   NA 141  04/27/2016   K 4.2 04/27/2016   CL 105 04/27/2016   CO2 31 04/27/2016   GLUCOSE 104 (H) 04/27/2016   BUN 9 04/27/2016   CREATININE 0.68 04/27/2016   CALCIUM 8.6 04/27/2016   PROT 6.6 04/27/2016   ALBUMIN 3.5 (L) 04/27/2016   AST 11 04/27/2016   ALT 10 04/27/2016   ALKPHOS 64 04/27/2016   BILITOT 0.2 04/27/2016   GFRNONAA >89 04/27/2016   GFRAA >89 04/27/2016    Lab Results  Component  Value Date   WBC 7.0 05/03/2016   NEUTROABS 3.8 05/03/2016   HGB 8.3 (L) 05/03/2016   HCT 25.9 (L) 05/03/2016   MCV 81.3 05/03/2016   PLT 335 05/03/2016   Lab Results  Component Value Date   IRON 29 02/07/2016   TIBC 320 02/07/2016   IRONPCTSAT 9 (L) 02/07/2016   Lab Results  Component Value Date   FERRITIN 12 02/07/2016     STUDIES: No results found.  ASSESSMENT: Bilateral pulmonary embolism, iron deficiency anemia.  PLAN:    1. Bilateral pulmonary embolism: Patient's entire hypercoagulable workup was negative. CT scan results reviewed independently. Her PE was likely secondary to birth control pills. These have been discontinued. Patient has now completed 6 months of Eliquis. Patient was also previously educated on additional transient risk factors for blood clots. No further intervention is needed at this time. 2. Iron deficiency anemia: Patient's hemoglobin and iron stores have trended down significantly and she is now symptomatic. She has agreed to proceed with IV iron today. Return to clinic in 1 week for a second infusion. She will then return to clinic in 2 months with repeat laboratory work and further evaluation.  3. Hypertension: Patient's blood pressure is within normal today. Continue current medications. Treatment per primary care.  Patient expressed understanding and was in agreement with this plan. She also understands that She can call clinic at any time with any questions, concerns, or complaints.    Madeline Huger, MD   05/03/2016 12:08 PM

## 2016-05-03 ENCOUNTER — Inpatient Hospital Stay: Payer: 59

## 2016-05-03 ENCOUNTER — Inpatient Hospital Stay: Payer: 59 | Attending: Oncology

## 2016-05-03 ENCOUNTER — Inpatient Hospital Stay (HOSPITAL_BASED_OUTPATIENT_CLINIC_OR_DEPARTMENT_OTHER): Payer: 59 | Admitting: Oncology

## 2016-05-03 VITALS — BP 134/84 | HR 69 | Temp 97.6°F | Resp 16 | Ht 67.0 in | Wt 359.6 lb

## 2016-05-03 DIAGNOSIS — E669 Obesity, unspecified: Secondary | ICD-10-CM

## 2016-05-03 DIAGNOSIS — D500 Iron deficiency anemia secondary to blood loss (chronic): Secondary | ICD-10-CM

## 2016-05-03 DIAGNOSIS — Z86718 Personal history of other venous thrombosis and embolism: Secondary | ICD-10-CM | POA: Diagnosis not present

## 2016-05-03 DIAGNOSIS — E039 Hypothyroidism, unspecified: Secondary | ICD-10-CM | POA: Diagnosis not present

## 2016-05-03 DIAGNOSIS — I420 Dilated cardiomyopathy: Secondary | ICD-10-CM | POA: Diagnosis not present

## 2016-05-03 DIAGNOSIS — R5383 Other fatigue: Secondary | ICD-10-CM | POA: Insufficient documentation

## 2016-05-03 DIAGNOSIS — R531 Weakness: Secondary | ICD-10-CM | POA: Diagnosis not present

## 2016-05-03 DIAGNOSIS — I10 Essential (primary) hypertension: Secondary | ICD-10-CM

## 2016-05-03 DIAGNOSIS — I2699 Other pulmonary embolism without acute cor pulmonale: Secondary | ICD-10-CM

## 2016-05-03 DIAGNOSIS — D509 Iron deficiency anemia, unspecified: Secondary | ICD-10-CM | POA: Diagnosis not present

## 2016-05-03 LAB — CBC WITH DIFFERENTIAL/PLATELET
BASOS ABS: 0 10*3/uL (ref 0–0.1)
Basophils Relative: 0 %
EOS PCT: 3 %
Eosinophils Absolute: 0.2 10*3/uL (ref 0–0.7)
HCT: 25.9 % — ABNORMAL LOW (ref 35.0–47.0)
Hemoglobin: 8.3 g/dL — ABNORMAL LOW (ref 12.0–16.0)
Lymphocytes Relative: 35 %
Lymphs Abs: 2.4 10*3/uL (ref 1.0–3.6)
MCH: 25.9 pg — ABNORMAL LOW (ref 26.0–34.0)
MCHC: 31.9 g/dL — ABNORMAL LOW (ref 32.0–36.0)
MCV: 81.3 fL (ref 80.0–100.0)
MONO ABS: 0.6 10*3/uL (ref 0.2–0.9)
Monocytes Relative: 9 %
Neutro Abs: 3.8 10*3/uL (ref 1.4–6.5)
Neutrophils Relative %: 53 %
Platelets: 335 10*3/uL (ref 150–440)
RBC: 3.19 MIL/uL — ABNORMAL LOW (ref 3.80–5.20)
RDW: 18.3 % — AB (ref 11.5–14.5)
WBC: 7 10*3/uL (ref 3.6–11.0)

## 2016-05-03 LAB — IRON AND TIBC
IRON: 12 ug/dL — AB (ref 28–170)
Saturation Ratios: 4 % — ABNORMAL LOW (ref 10.4–31.8)
TIBC: 312 ug/dL (ref 250–450)
UIBC: 300 ug/dL

## 2016-05-03 LAB — FERRITIN: Ferritin: 7 ng/mL — ABNORMAL LOW (ref 11–307)

## 2016-05-03 MED ORDER — SODIUM CHLORIDE 0.9 % IV SOLN
510.0000 mg | Freq: Once | INTRAVENOUS | Status: AC
  Administered 2016-05-03: 510 mg via INTRAVENOUS
  Filled 2016-05-03: qty 17

## 2016-05-03 MED ORDER — SODIUM CHLORIDE 0.9 % IV SOLN
Freq: Once | INTRAVENOUS | Status: AC
  Administered 2016-05-03: 12:00:00 via INTRAVENOUS
  Filled 2016-05-03: qty 1000

## 2016-05-07 ENCOUNTER — Other Ambulatory Visit: Payer: 59

## 2016-05-07 ENCOUNTER — Ambulatory Visit: Payer: 59

## 2016-05-07 ENCOUNTER — Ambulatory Visit: Payer: 59 | Admitting: Oncology

## 2016-05-10 ENCOUNTER — Inpatient Hospital Stay: Payer: 59

## 2016-05-10 VITALS — BP 149/82 | HR 70 | Temp 98.5°F | Resp 18

## 2016-05-10 DIAGNOSIS — D509 Iron deficiency anemia, unspecified: Secondary | ICD-10-CM

## 2016-05-10 MED ORDER — SODIUM CHLORIDE 0.9 % IV SOLN
Freq: Once | INTRAVENOUS | Status: AC
  Administered 2016-05-10: 15:00:00 via INTRAVENOUS
  Filled 2016-05-10: qty 1000

## 2016-05-10 MED ORDER — SODIUM CHLORIDE 0.9 % IV SOLN
510.0000 mg | Freq: Once | INTRAVENOUS | Status: AC
  Administered 2016-05-10: 510 mg via INTRAVENOUS
  Filled 2016-05-10: qty 17

## 2016-05-18 ENCOUNTER — Emergency Department
Admission: EM | Admit: 2016-05-18 | Discharge: 2016-05-18 | Disposition: A | Discharge status: Home / Self Care | Payer: 59 | Attending: Emergency Medicine | Admitting: Emergency Medicine

## 2016-05-18 ENCOUNTER — Emergency Department: Payer: 59

## 2016-05-18 ENCOUNTER — Other Ambulatory Visit: Payer: Self-pay

## 2016-05-18 ENCOUNTER — Encounter: Payer: Self-pay | Admitting: Emergency Medicine

## 2016-05-18 DIAGNOSIS — I10 Essential (primary) hypertension: Secondary | ICD-10-CM | POA: Diagnosis not present

## 2016-05-18 DIAGNOSIS — E039 Hypothyroidism, unspecified: Secondary | ICD-10-CM | POA: Diagnosis not present

## 2016-05-18 DIAGNOSIS — R0600 Dyspnea, unspecified: Secondary | ICD-10-CM

## 2016-05-18 DIAGNOSIS — Z79899 Other long term (current) drug therapy: Secondary | ICD-10-CM | POA: Diagnosis not present

## 2016-05-18 DIAGNOSIS — R0602 Shortness of breath: Secondary | ICD-10-CM | POA: Insufficient documentation

## 2016-05-18 LAB — COMPREHENSIVE METABOLIC PANEL
ALBUMIN: 3.6 g/dL (ref 3.5–5.0)
ALK PHOS: 77 U/L (ref 38–126)
ALT: 10 U/L — AB (ref 14–54)
AST: 12 U/L — ABNORMAL LOW (ref 15–41)
Anion gap: 5 (ref 5–15)
BUN: 13 mg/dL (ref 6–20)
CALCIUM: 9.2 mg/dL (ref 8.9–10.3)
CHLORIDE: 107 mmol/L (ref 101–111)
CO2: 30 mmol/L (ref 22–32)
CREATININE: 0.73 mg/dL (ref 0.44–1.00)
GFR calc Af Amer: >60 mL/min (ref >60)
GFR calc non Af Amer: >60 mL/min (ref >60)
GLUCOSE: 99 mg/dL (ref 65–99)
Potassium: 3.8 mmol/L (ref 3.5–5.1)
SODIUM: 142 mmol/L (ref 135–145)
Total Bilirubin: 0.3 mg/dL (ref 0.3–1.2)
Total Protein: 7.8 g/dL (ref 6.5–8.1)

## 2016-05-18 LAB — CBC WITH DIFFERENTIAL/PLATELET
BASOS ABS: 0 10*3/uL (ref 0–0.1)
BASOS PCT: 1 %
EOS ABS: 0.2 10*3/uL (ref 0–0.7)
Eosinophils Relative: 3 %
HCT: 33.6 % — ABNORMAL LOW (ref 35.0–47.0)
HEMOGLOBIN: 10.9 g/dL — AB (ref 12.0–16.0)
Lymphocytes Relative: 25 %
Lymphs Abs: 1.7 10*3/uL (ref 1.0–3.6)
MCH: 27.9 pg (ref 26.0–34.0)
MCHC: 32.4 g/dL (ref 32.0–36.0)
MCV: 86.1 fL (ref 80.0–100.0)
Monocytes Absolute: 0.5 10*3/uL (ref 0.2–0.9)
Monocytes Relative: 7 %
NEUTROS PCT: 64 %
Neutro Abs: 4.5 10*3/uL (ref 1.4–6.5)
PLATELETS: 256 10*3/uL (ref 150–440)
RBC: 3.91 MIL/uL (ref 3.80–5.20)
RDW: 22.5 % — ABNORMAL HIGH (ref 11.5–14.5)
WBC: 6.9 10*3/uL (ref 3.6–11.0)

## 2016-05-18 LAB — TROPONIN I: Troponin I: <0.03 ng/mL (ref <0.03)

## 2016-05-18 LAB — BRAIN NATRIURETIC PEPTIDE: B Natriuretic Peptide: 64 pg/mL (ref 0.0–100.0)

## 2016-05-18 NOTE — ED Triage Notes (Signed)
Pt states that she was sent by her PCP for intermittent shortness of breath. Pt states that she is not short of breath right now. Pt states that she thought that it was due to changes in her medication so she called her PCP and they advised her that since their office was closed that she should come to the ED to be evaluated. Pt denies chest pain or any other symptoms. Pt is in NAD.

## 2016-05-18 NOTE — ED Provider Notes (Signed)
[facility] Emergency Department Provider Note       Time seen: ----------------------------------------- 5:06 PM on 05/18/2016 -----------------------------------------     I have reviewed the triage vital signs and the nursing notes.   HISTORY   Chief Complaint Shortness of Breath    HPI Madeline Dominguez is a 51 y.o. female who presents to the ED for intermittent shortness of breath. She went to see her primary care doctor for same. Patient states she is not short of breath right now. She thought this was due to changes in her carvedilol dosing. She called her primary care doctor and advised her that since there office was closed "she should come to the ER to be evaluated. She denies fevers, chills, chest pain or other complaints.   Past Medical History:  Diagnosis Date  . Cough 07/25/2015  . Dilated cardiomyopathy ([place])   . H/O: hypothyroidism   . Heart palpitations   . Hypertension   . Iron deficiency   . Large breasts   . Morbid obesity ([place])   . Postpartum hemorrhage   . Thyroid disease     Patient Active Problem List   Diagnosis Date Noted  . 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 Right leg DVT ([place]) 08/01/2015  . Iron deficiency anemia 07/27/2015  . B12 deficiency 07/27/2015  . Bilateral pulmonary embolism ([place]) 07/25/2015  . Adrenal nodule ([place]) 07/25/2015  . Pulmonary nodules/lesions, multiple 07/25/2015  . OSA (obstructive sleep apnea) 05/18/2015  . Thyroid nodule 12/17/2014  . Heavy menstrual period 09/02/2014  . Gastroesophageal reflux disease without esophagitis 09/02/2014  . Extreme obesity 06/22/2014  . Large breasts 06/22/2014  . Anemia, iron deficiency 06/22/2014  . Benign hypertension 06/22/2014  . H/O hyperthyroidism 06/22/2014  . Right ventricular dilation 06/22/2014  . 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    Past Surgical History:  Procedure  Laterality Date  . CESAREAN SECTION      Allergies Aspirin  Social History Social History  Substance Use Topics  . Smoking status: Never Smoker  . Smokeless tobacco: Never Used  . Alcohol use No     Comment: rarely    Review of Systems Constitutional: Negative for fever. Eyes: Negative for vision changes ENT:  Negative for congestion, sore throat Cardiovascular: Negative for chest pain. Respiratory: Positive shortness of breath Gastrointestinal: Negative for abdominal pain, vomiting and diarrhea. Genitourinary: Negative for dysuria. Musculoskeletal: Negative for back pain. Skin: Negative for rash. Neurological: Negative for headaches, focal weakness or numbness.  All systems negative/normal/unremarkable except as stated in the HPI  ____________________________________________   PHYSICAL EXAM:  VITAL SIGNS: ED Triage Vitals  Enc Vitals Group     BP --      Pulse Rate 05/18/16 1345 76     Resp 05/18/16 1345 18     Temp 05/18/16 1345 98.4 F (36.9 C)     Temp Source 05/18/16 1345 Oral     SpO2 05/18/16 1345 96 %     Weight 05/18/16 1345 (!) 350 lb (158.8 kg)     Height 05/18/16 1345 5\' 7"  (1.702 m)     Head Circumference --      Peak Flow --      Pain Score 05/18/16 1648 0     Pain Loc --      Pain Edu? --      Excl. in [place]? --     Constitutional: Alert and oriented. Well appearing and in no distress.  Eyes: Conjunctivae are normal. PERRL. Normal extraocular movements. ENT   Head: Normocephalic and atraumatic.   Nose: No congestion/rhinnorhea.   Mouth/Throat: Mucous membranes are moist.   Neck: No stridor. Cardiovascular: Normal rate, regular rhythm. No murmurs, rubs, or gallops. Respiratory: Normal respiratory effort without tachypnea nor retractions. Breath sounds are clear and equal bilaterally. No wheezes/rales/rhonchi. Gastrointestinal: Soft and nontender. Normal bowel sounds Musculoskeletal: Nontender with normal range of motion in  extremities. No lower extremity tenderness nor edema. Neurologic:  Normal speech and language. No gross focal neurologic deficits are appreciated.  Skin:  Skin is warm, dry and intact. No rash noted. Psychiatric: Mood and affect are normal. Speech and behavior are normal.  ____________________________________________  EKG: Interpreted by me.Sinus rhythm with a rate of 70 bpm, normal PR interval, voltage criteria for LVH, normal QT, leftward axis  ____________________________________________  ED COURSE:  Pertinent labs & imaging results that were available during my care of the patient were reviewed by me and considered in my medical decision making (see chart for details). Patient presents for shortness of breath, we will assess with labs and imaging as indicated.   Procedures ____________________________________________   LABS (pertinent positives/negatives)  Labs Reviewed  CBC WITH DIFFERENTIAL/PLATELET - Abnormal; Notable for the following:       Result Value   Hemoglobin 10.9 (*)    HCT 33.6 (*)    RDW 22.5 (*)    All other components within normal limits  COMPREHENSIVE METABOLIC PANEL - Abnormal; Notable for the following:    AST 12 (*)    ALT 10 (*)    All other components within normal limits  TROPONIN I  BRAIN NATRIURETIC PEPTIDE    RADIOLOGY  Chest x-ray  IMPRESSION: Stable mild cardiac enlargement.  No edema or consolidation.  ____________________________________________  FINAL ASSESSMENT AND PLAN  Dyspnea  Plan: Patient's labs and imaging were dictated above. Patient had presented for shortness of breath of uncertain etiology. Her hemoglobin is improving and the remainder of her lab work is unremarkable. She is not felt to be at risk for PE. I advised her to keep a daily log of her blood pressure and follow-up with her doctor in 1 week.   Earleen Newport, MD   Note: This note was generated in part or whole with voice recognition software.  Voice recognition is usually quite accurate but there are transcription errors that can and very often do occur. I apologize for any typographical errors that were not detected and corrected.     Earleen Newport, MD 05/18/16 [phone]

## 2016-05-27 DIAGNOSIS — R0683 Snoring: Secondary | ICD-10-CM | POA: Diagnosis not present

## 2016-05-27 DIAGNOSIS — G4733 Obstructive sleep apnea (adult) (pediatric): Secondary | ICD-10-CM | POA: Diagnosis not present

## 2016-06-01 ENCOUNTER — Encounter: Payer: Self-pay | Admitting: Radiology

## 2016-06-01 ENCOUNTER — Ambulatory Visit
Admission: RE | Admit: 2016-06-01 | Discharge: 2016-06-01 | Disposition: A | Discharge status: Home / Self Care | Payer: 59 | Source: Ambulatory Visit | Attending: Family Medicine | Admitting: Family Medicine

## 2016-06-01 DIAGNOSIS — Z1239 Encounter for other screening for malignant neoplasm of breast: Secondary | ICD-10-CM

## 2016-06-01 DIAGNOSIS — Z1231 Encounter for screening mammogram for malignant neoplasm of breast: Secondary | ICD-10-CM | POA: Diagnosis present

## 2016-06-05 ENCOUNTER — Encounter: Payer: Self-pay | Admitting: Family Medicine

## 2016-06-05 ENCOUNTER — Ambulatory Visit (INDEPENDENT_AMBULATORY_CARE_PROVIDER_SITE_OTHER): Payer: 59 | Admitting: Family Medicine

## 2016-06-05 VITALS — BP 138/82 | HR 79 | Temp 98.1°F | Resp 16 | Ht 67.0 in | Wt 357.3 lb

## 2016-06-05 DIAGNOSIS — I10 Essential (primary) hypertension: Secondary | ICD-10-CM | POA: Diagnosis not present

## 2016-06-05 MED ORDER — HYDROCHLOROTHIAZIDE 12.5 MG PO TABS: 12.5000 mg | ORAL_TABLET | Freq: Every day | ORAL | 0 refills | Status: DC

## 2016-06-05 NOTE — Patient Instructions (Addendum)
Please check your Blood Pressure a few times a week and bring a log to your next follow up. Please take the Hydrochlorothiazide daily in the mornings. Continue your Carvedilol.

## 2016-06-05 NOTE — Progress Notes (Addendum)
Name: Madeline Dominguez   MRN: [id number]    DOB: Oct 14, 1965   Date:06/05/2016       Progress Note  Subjective  Chief Complaint  Chief Complaint  Patient presents with  . Shortness of Breath    when B/P spikes she has SOB  . Medication Problem    would like to change B/P medication  . Hypertension    pt stated that her B/P has been spiking, she has been keeping a record of it since medication change                                     HPI  PT presents for ER follow up with c/o shortness of breath that only occurs when blood pressure gets high and last for 1-1.5 minutes. She has been checking her BP's at home and they appear to be quite elevated while at home - Highest reading 187/115, lowest reading 129/93. Pt notes she can "feel it" when her BP is starting to rise and this makes her anxious - she is not sure if the shortness of breath is a panic attack or not.  She endorses BLE edema that decreases at night with elevation and coughing in the mornings for a few minutes. No chest pain, NVD, abdominal pain, no wheezing.    ER Notes and Results reviewed: Normal BNP & Troponin, Hgb & HCT have improved, Negative chest Xray.  Pt taking Carvedilol 12.72m BID.  Advised adding HCTZ as this may help with the BLE edema that she has ongoing as well.  She has been following a low sodium diet, but does admit to eating bacon occasionally.   Patient Active Problem List   Diagnosis Date Noted  . 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 [place] 08/01/2015  . Iron deficiency anemia 07/27/2015  . B12 deficiency 07/27/2015  . Bilateral pulmonary embolism ([place] 07/25/2015  . Ad[place]al nodule ([place] 07/25/2015  . Pulmonary nodules/lesions, multiple 07/25/2015  . OSA (obstructive sleep apnea) 05/18/2015  . Thyroid nodule 12/17/2014  . Heavy menstrual period 09/02/2014  . Gastroesophageal reflux disease without esophagitis 09/02/2014  . Extreme obesity 06/22/2014  . Large breasts  06/22/2014  . Anemia, iron deficiency 06/22/2014  . Benign hypertension 06/22/2014  . H/O hyperthyroidism 06/22/2014  . Right ventricular dilation 06/22/2014  . 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    Social History  Substance Use Topics  . Smoking status: Never Smoker  . Smokeless tobacco: Never Used  . Alcohol use No     Comment: rarely     Current Outpatient Prescriptions:  .  carvedilol (COREG) 12.5 MG tablet, Take 1 tablet (12.5 mg total) by mouth 2 (two) times daily with a meal., Disp: 180 tablet, Rfl: 1 .  ferrous sulfate 325 (65 FE) MG tablet, Take 1 tablet (325 mg total) by mouth 2 (two) times daily with a meal., Disp: 180 tablet, Rfl: 1 .  vitamin B-12 1000 MCG tablet, Take 1 tablet (1,000 mcg total) by mouth daily., Disp: 30 tablet, Rfl: 0 .  ELIQUIS 5 MG TABS tablet, , Disp: , Rfl:   Allergies  Allergen Reactions  . Aspirin Nausea And Vomiting    ROS  Ten systems reviewed and is negative except as mentioned in HPI   Objective  Vitals:   06/05/16 1104  BP: 138/82  Pulse: 79  Resp: 16  Temp: 98.1 F (36.7 C)  SpO2: 95%  Weight: (!) 357 lb 5 oz (162.1 kg)  Height: 5' 7"  (1.702 m)    Body mass index is 55.96 kg/m.  Nursing Note and Vital Signs reviewed.  Physical Exam  Constitutional: Patient appears well-developed and well-nourished. Morbidly Obese  No distress.  HEENT: head atraumatic, normocephalic Cardiovascular: Normal rate, regular rhythm, S1/S2 present.  No murmur or rub heard. Non-pitting BLE edema present. Pulmonary/Chest: Effort normal and breath sounds clear. No respiratory distress or retractions. Abdominal: Soft and non-tender, bowel sounds present x4 quadrants. Psychiatric: Patient has a normal mood and affect. behavior is normal. Judgment and thought content normal.  Recent Results (from the past 2160 hour(s))  Pap IG and HPV (high risk) DNA detection     Status: Abnormal    Collection Time: 03/23/16  4:00 PM  Result Value Ref Range   HPV DNA High Risk Detected (A)     Comment: One or more High/Intermediate HPV types (16,18,31,33,35,39,45,51,52,56,58,59,66,68) was detected.                  ** Normal Reference Range: Not Detected **      HPV High Risk testing performed using the APTIMA HPV mRNA Assay.      Specimen adequacy:      Comment: SATISFACTORY.  Endocervical/transformation zone component present.   FINAL DIAGNOSIS:      Comment: - NEGATIVE FOR INTRAEPITHELIAL LESIONS OR MALIGNANCY.    COMMENTS:      Comment: This Pap test has been evaluated with computer assisted technology.   Cytotechnologist:      Comment: JLT, BS CT(ASCP) *  The Pap is a screening test for cervical cancer. It is not a  diagnostic test and is subject to false negative and false positive  results. It is most reliable when a satisfactory sample, regularly  obtained, is submitted with relevant clinical findings and history,  and when the Pap result is evaluated along with historic and current  clinical information.   COMPLETE METABOLIC PANEL WITH GFR     Status: Abnormal   Collection Time: 04/27/16 11:29 AM  Result Value Ref Range   Sodium 141 135 - 146 mmol/L   Potassium 4.2 3.5 - 5.3 mmol/L   Chloride 105 98 - 110 mmol/L   CO2 31 20 - 31 mmol/L   Glucose, Bld 104 (H) 65 - 99 mg/dL   BUN 9 7 - 25 mg/dL   Creat 0.68 0.50 - 1.10 mg/dL   Total Bilirubin 0.2 0.2 - 1.2 mg/dL   Alkaline Phosphatase 64 33 - 115 U/L   AST 11 10 - 35 U/L   ALT 10 6 - 29 U/L   Total Protein 6.6 6.1 - 8.1 g/dL   Albumin 3.5 (L) 3.6 - 5.1 g/dL   Calcium 8.6 8.6 - 10.2 mg/dL   GFR, Est African American >89 >=60 mL/min   GFR, Est Non African American >89 >=60 mL/min  Lipid panel     Status: None   Collection Time: 04/27/16 11:29 AM  Result Value Ref Range   Cholesterol 178 <200 mg/dL   Triglycerides 53 <150 mg/dL   HDL 68 >50 mg/dL   Total CHOL/HDL Ratio 2.6 <5.0 Ratio   VLDL 11 <30 mg/dL    LDL Cholesterol 99 <100 mg/dL  Hemoglobin A1c     Status: None   Collection Time: 04/27/16 11:29 AM  Result Value Ref Range   Hgb A1c MFr Bld 4.9 <5.7 %  Comment:   For the purpose of screening for the presence of diabetes:   <5.7%       Consistent with the absence of diabetes 5.7-6.4 %   Consistent with increased risk for diabetes (prediabetes) >=6.5 %     Consistent with diabetes   This assay result is consistent with a decreased risk of diabetes.   Currently, no consensus exists regarding use of hemoglobin A1c for diagnosis of diabetes in children.   According to American Diabetes Association (ADA) guidelines, hemoglobin A1c <7.0% represents optimal control in non-pregnant diabetic patients. Different metrics may apply to specific patient populations. Standards of Medical Care in Diabetes (ADA).      Mean Plasma Glucose 94 mg/dL  Insulin, fasting     Status: None   Collection Time: 04/27/16 11:29 AM  Result Value Ref Range   Insulin fasting, serum 10.3 2.0 - 19.6 uIU/mL    Comment:   This insulin assay shows strong cross-reactivity for some insulin analogs (lispro, aspart, and glargine) and much lower cross-reactivity with others (detemir, glulisine).   Stimulated Insulin reference intervals were established using the Siemens Immulite assay. These values are provided for general guidance only.   CBC with Differential     Status: Abnormal   Collection Time: 05/03/16 10:40 AM  Result Value Ref Range   WBC 7.0 3.6 - 11.0 K/uL   RBC 3.19 (L) 3.80 - 5.20 MIL/uL   Hemoglobin 8.3 (L) 12.0 - 16.0 g/dL   HCT 25.9 (L) 35.0 - 47.0 %   MCV 81.3 80.0 - 100.0 fL   MCH 25.9 (L) 26.0 - 34.0 pg   MCHC 31.9 (L) 32.0 - 36.0 g/dL   RDW 18.3 (H) 11.5 - 14.5 %   Platelets 335 150 - 440 K/uL   Neutrophils Relative % 53 %   Neutro Abs 3.8 1.4 - 6.5 K/uL   Lymphocytes Relative 35 %   Lymphs Abs 2.4 1.0 - 3.6 K/uL   Monocytes Relative 9 %   Monocytes Absolute 0.6 0.2 - 0.9 K/uL    Eosinophils Relative 3 %   Eosinophils Absolute 0.2 0 - 0.7 K/uL   Basophils Relative 0 %   Basophils Absolute 0.0 0 - 0.1 K/uL  Ferritin     Status: Abnormal   Collection Time: 05/03/16 10:40 AM  Result Value Ref Range   Ferritin 7 (L) 11 - 307 ng/mL  Iron and TIBC     Status: Abnormal   Collection Time: 05/03/16 10:40 AM  Result Value Ref Range   Iron 12 (L) 28 - 170 ug/dL   TIBC 312 250 - 450 ug/dL   Saturation Ratios 4 (L) 10.4 - 31.8 %   UIBC 300 ug/dL  CBC with Differential/Platelet     Status: Abnormal   Collection Time: 05/18/16  4:49 PM  Result Value Ref Range   WBC 6.9 3.6 - 11.0 K/uL   RBC 3.91 3.80 - 5.20 MIL/uL   Hemoglobin 10.9 (L) 12.0 - 16.0 g/dL   HCT 33.6 (L) 35.0 - 47.0 %   MCV 86.1 80.0 - 100.0 fL   MCH 27.9 26.0 - 34.0 pg   MCHC 32.4 32.0 - 36.0 g/dL   RDW 22.5 (H) 11.5 - 14.5 %   Platelets 256 150 - 440 K/uL   Neutrophils Relative % 64 %   Neutro Abs 4.5 1.4 - 6.5 K/uL   Lymphocytes Relative 25 %   Lymphs Abs 1.7 1.0 - 3.6 K/uL   Monocytes Relative 7 %  Monocytes Absolute 0.5 0.2 - 0.9 K/uL   Eosinophils Relative 3 %   Eosinophils Absolute 0.2 0 - 0.7 K/uL   Basophils Relative 1 %   Basophils Absolute 0.0 0 - 0.1 K/uL  Comprehensive metabolic panel     Status: Abnormal   Collection Time: 05/18/16  4:49 PM  Result Value Ref Range   Sodium 142 135 - 145 mmol/L   Potassium 3.8 3.5 - 5.1 mmol/L   Chloride 107 101 - 111 mmol/L   CO2 30 22 - 32 mmol/L   Glucose, Bld 99 65 - 99 mg/dL   BUN 13 6 - 20 mg/dL   Creatinine, Ser 0.73 0.44 - 1.00 mg/dL   Calcium 9.2 8.9 - 10.3 mg/dL   Total Protein 7.8 6.5 - 8.1 g/dL   Albumin 3.6 3.5 - 5.0 g/dL   AST 12 (L) 15 - 41 U/L   ALT 10 (L) 14 - 54 U/L   Alkaline Phosphatase 77 38 - 126 U/L   Total Bilirubin 0.3 0.3 - 1.2 mg/dL   GFR calc non Af Amer >60 >60 mL/min   GFR calc Af Amer >60 >60 mL/min    Comment: (NOTE) The eGFR has been calculated using the CKD EPI equation. This calculation has not been  validated in all clinical situations. eGFR's persistently <60 mL/min signify possible Chronic Kidney Disease.    Anion gap 5 5 - 15  Troponin I     Status: None   Collection Time: 05/18/16  4:49 PM  Result Value Ref Range   Troponin I <0.03 <0.03 ng/mL  Brain natriuretic peptide     Status: None   Collection Time: 05/18/16  4:49 PM  Result Value Ref Range   B Natriuretic Peptide 64.0 0.0 - 100.0 pg/mL     Assessment & Plan  1. Uncontrolled hypertension - hydrochlorothiazide (HYDRODIURIL) 12.5 MG tablet; Take 1 tablet (12.5 mg total) by mouth daily.  Dispense: 30 tablet; Refill: 0 -Discussed medicationoptions and shortness of breath at length with patient. Red flags as below. Pt will monitor BP a few times a week until her next follow up and will bring log with her. -She will continue to take Carvedilol as prescribed. -Red flags and when to present for emergency care or RTC including fever >101.65F, chest pain, shortness of breath, new/worsening/un-resolving symptoms,severe headache, weakness, vision changes, reviewed with patient at time of visit. Follow up and care instructions discussed and provided in AVS. -Reviewed Health Maintenance: Has f/u with Dr. Ancil Boozer later in June  I have reviewed this encounter including the documentation in this note and/or discussed this patient with the Johney Maine, FNP, NP-C. I am certifying that I agree with the content of this note as supervising physician.  Steele Sizer, MD [facility] 06/10/2016, 10:20 AM

## 2016-06-11 NOTE — Addendum Note (Signed)
Addended by: Raelyn Ensign E on: 06/11/2016 11:13 AM   Modules accepted: Level of Service

## 2016-06-13 ENCOUNTER — Encounter: Payer: Self-pay | Admitting: Emergency Medicine

## 2016-06-13 ENCOUNTER — Encounter: Payer: Self-pay | Admitting: Family Medicine

## 2016-06-13 ENCOUNTER — Emergency Department
Admission: EM | Admit: 2016-06-13 | Discharge: 2016-06-13 | Disposition: A | Discharge status: Home / Self Care | Payer: Commercial Managed Care - HMO | Attending: Emergency Medicine | Admitting: Emergency Medicine

## 2016-06-13 ENCOUNTER — Emergency Department: Payer: Commercial Managed Care - HMO

## 2016-06-13 ENCOUNTER — Ambulatory Visit (INDEPENDENT_AMBULATORY_CARE_PROVIDER_SITE_OTHER): Payer: 59 | Admitting: Family Medicine

## 2016-06-13 ENCOUNTER — Telehealth: Payer: Self-pay | Admitting: Family Medicine

## 2016-06-13 VITALS — BP 160/100 | HR 74 | Temp 98.1°F | Resp 18 | Ht 67.0 in | Wt 352.5 lb

## 2016-06-13 DIAGNOSIS — I10 Essential (primary) hypertension: Secondary | ICD-10-CM

## 2016-06-13 DIAGNOSIS — E039 Hypothyroidism, unspecified: Secondary | ICD-10-CM | POA: Diagnosis not present

## 2016-06-13 DIAGNOSIS — R079 Chest pain, unspecified: Secondary | ICD-10-CM

## 2016-06-13 DIAGNOSIS — E0520 Thyrotoxicosis with toxic multinodular goiter without thyrotoxic crisis or storm: Secondary | ICD-10-CM | POA: Diagnosis not present

## 2016-06-13 DIAGNOSIS — R519 Headache, unspecified: Secondary | ICD-10-CM

## 2016-06-13 DIAGNOSIS — R42 Dizziness and giddiness: Secondary | ICD-10-CM

## 2016-06-13 DIAGNOSIS — R51 Headache: Secondary | ICD-10-CM | POA: Insufficient documentation

## 2016-06-13 DIAGNOSIS — Z7901 Long term (current) use of anticoagulants: Secondary | ICD-10-CM | POA: Insufficient documentation

## 2016-06-13 LAB — BASIC METABOLIC PANEL
Anion gap: 7 (ref 5–15)
BUN: 9 mg/dL (ref 6–20)
CO2: 30 mmol/L (ref 22–32)
CREATININE: 0.54 mg/dL (ref 0.44–1.00)
Calcium: 9.5 mg/dL (ref 8.9–10.3)
Chloride: 100 mmol/L — ABNORMAL LOW (ref 101–111)
GFR calc Af Amer: >60 mL/min (ref >60)
Glucose, Bld: 110 mg/dL — ABNORMAL HIGH (ref 65–99)
POTASSIUM: 3.4 mmol/L — AB (ref 3.5–5.1)
SODIUM: 137 mmol/L (ref 135–145)

## 2016-06-13 LAB — CBC
HCT: 36.3 % (ref 35.0–47.0)
Hemoglobin: 12 g/dL (ref 12.0–16.0)
MCH: 28.4 pg (ref 26.0–34.0)
MCHC: 33 g/dL (ref 32.0–36.0)
MCV: 86 fL (ref 80.0–100.0)
Platelets: 253 10*3/uL (ref 150–440)
RBC: 4.22 MIL/uL (ref 3.80–5.20)
RDW: 19.8 % — AB (ref 11.5–14.5)
WBC: 5.3 10*3/uL (ref 3.6–11.0)

## 2016-06-13 LAB — TROPONIN I
Troponin I: <0.03 ng/mL (ref <0.03)
Troponin I: <0.03 ng/mL (ref <0.03)

## 2016-06-13 LAB — URINALYSIS, COMPLETE (UACMP) WITH MICROSCOPIC
BILIRUBIN URINE: NEGATIVE
Glucose, UA: NEGATIVE mg/dL
Ketones, ur: NEGATIVE mg/dL
NITRITE: POSITIVE — AB
PH: 6 (ref 5.0–8.0)
Protein, ur: NEGATIVE mg/dL
SPECIFIC GRAVITY, URINE: 1.013 (ref 1.005–1.030)

## 2016-06-13 LAB — COMPLETE METABOLIC PANEL WITH GFR
ALT: 8 U/L (ref 6–29)
AST: 8 U/L — ABNORMAL LOW (ref 10–35)
Albumin: 3.6 g/dL (ref 3.6–5.1)
Alkaline Phosphatase: 78 U/L (ref 33–115)
BUN: 9 mg/dL (ref 7–25)
CALCIUM: 9.5 mg/dL (ref 8.6–10.2)
CHLORIDE: 101 mmol/L (ref 98–110)
CO2: 32 mmol/L — ABNORMAL HIGH (ref 20–31)
CREATININE: 0.69 mg/dL (ref 0.50–1.10)
Glucose, Bld: 106 mg/dL — ABNORMAL HIGH (ref 65–99)
Potassium: 3.9 mmol/L (ref 3.5–5.3)
Sodium: 140 mmol/L (ref 135–146)
Total Bilirubin: 0.4 mg/dL (ref 0.2–1.2)
Total Protein: 6.8 g/dL (ref 6.1–8.1)

## 2016-06-13 LAB — GLUCOSE, CAPILLARY: Glucose-Capillary: 106 mg/dL — ABNORMAL HIGH (ref 65–99)

## 2016-06-13 LAB — HCG, QUANTITATIVE, PREGNANCY: hCG, Beta Chain, Quant, S: 1 m[IU]/mL (ref <5)

## 2016-06-13 LAB — TSH: TSH: 3.895 u[IU]/mL (ref 0.350–4.500)

## 2016-06-13 LAB — T4, FREE: Free T4: 0.77 ng/dL (ref 0.61–1.12)

## 2016-06-13 MED ORDER — SODIUM CHLORIDE 0.9 % IV BOLUS (SEPSIS)
500.0000 mL | Freq: Once | INTRAVENOUS | Status: AC
  Administered 2016-06-13: 500 mL via INTRAVENOUS

## 2016-06-13 MED ORDER — ACETAMINOPHEN 500 MG PO TABS
1000.0000 mg | ORAL_TABLET | Freq: Once | ORAL | Status: AC
  Administered 2016-06-13: 1000 mg via ORAL
  Filled 2016-06-13: qty 2

## 2016-06-13 MED ORDER — METOCLOPRAMIDE HCL 5 MG/ML IJ SOLN
10.0000 mg | Freq: Once | INTRAMUSCULAR | Status: AC
  Administered 2016-06-13: 10 mg via INTRAVENOUS
  Filled 2016-06-13: qty 2

## 2016-06-13 MED ORDER — DIPHENHYDRAMINE HCL 50 MG/ML IJ SOLN
25.0000 mg | Freq: Once | INTRAMUSCULAR | Status: AC
  Administered 2016-06-13: 25 mg via INTRAVENOUS
  Filled 2016-06-13: qty 1

## 2016-06-13 NOTE — Patient Instructions (Addendum)
Please go directly to the [facility] Emergency Room. Please do not make any changes to your blood pressure medications until you call us after your Emergency Room Visit. Schedule a follow up for as soon as possible after ER visit.

## 2016-06-13 NOTE — ED Triage Notes (Signed)
Pt in via POV, sent over from PCP due to hypertension.  Pt reports intermittent dizziness since Monday.  Pt appears drowsy with delayed responses, neurologically intact other wise.  A/Ox4, vitals WDL upon arrival.

## 2016-06-13 NOTE — ED Triage Notes (Signed)
Pt sent from PCP for headache, photosensitivity, dizziness and hypertension (160/100).

## 2016-06-13 NOTE — ED Provider Notes (Signed)
[facility] Emergency Department Provider Note  ____________________________________________  Time seen: Approximately 2:22 PM  I have reviewed the triage vital signs and the nursing notes.   HISTORY  Chief Complaint Hypertension   HPI Madeline Dominguez is a 51 y.o. female with a history of a dilated cardiomyopathy (EF 45-50%), toxic goiter s/p ablation in 2015, PE/ DVT provoked by OCP no longer on Eliquis for 1 month, HTN, obesity, anemia who presents for evaluation of high blood pressure and a headache.Patient reports that she's been having a hard time controlling her blood pressure since the beginning of this month. She is on carvedilol and saw her primary care doctor a week ago and was started on hydrochlorothiazide. She reports that she has been taking both medications. For the last few days patient has had a mild frontal headache associated with intermittent blurry vision and photophobia. No facial droop, no slurred speech, no difficulty finding words, no unilateral weakness or numbness. She has had intermittent episodes of dizziness associated with it. She denies history of similar headaches or migraines. She reports that the headache is 3 out of 10. She has not taken anything at home for it. Also over the course of the last 2 weeks she has had episodes of tightness around her anterior neck. These episodes last hours at a time and sometimes it goes all day long. It happens both at rest and with exertion. Is it is not associated with shortness of breath, diaphoresis, or nausea. No chest pain, no cough or congestion, no shortness of breath. Last episode was 2 days ago. She went to see her primary care doctor again today and he was sent to the emergency room for further evaluation due to her headache and photophobia.  Past Medical History:  Diagnosis Date  . Cough 07/25/2015  . Dilated cardiomyopathy ([place])   . H/O: hypothyroidism   . Heart palpitations   .  Hypertension   . Iron deficiency   . Large breasts   . Morbid obesity ([place])   . Postpartum hemorrhage   . Thyroid disease     Patient Active Problem List   Diagnosis Date Noted  . 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 Right leg DVT ([place]) 08/01/2015  . Iron deficiency anemia 07/27/2015  . B12 deficiency 07/27/2015  . Bilateral pulmonary embolism ([place]) 07/25/2015  . Adrenal nodule ([place]) 07/25/2015  . Pulmonary nodules/lesions, multiple 07/25/2015  . OSA (obstructive sleep apnea) 05/18/2015  . Thyroid nodule 12/17/2014  . Heavy menstrual period 09/02/2014  . Gastroesophageal reflux disease without esophagitis 09/02/2014  . Extreme obesity 06/22/2014  . Large breasts 06/22/2014  . Anemia, iron deficiency 06/22/2014  . Uncontrolled hypertension 06/22/2014  . H/O hyperthyroidism 06/22/2014  . Right ventricular dilation 06/22/2014  . 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    Past Surgical History:  Procedure Laterality Date  . CESAREAN SECTION      Prior to Admission medications   Medication Sig Start Date End Date Taking? Authorizing Provider  ferrous sulfate 325 (65 FE) MG tablet Take 1 tablet (325 mg total) by mouth 2 (two) times daily with a meal. 09/02/14  Yes Sowles, Drue Stager, MD  hydrochlorothiazide (HYDRODIURIL) 12.5 MG tablet Take 1 tablet (12.5 mg total) by mouth daily. 06/05/16  Yes Hubbard Hartshorn, FNP  vitamin B-12 1000 MCG tablet Take 1 tablet (1,000 mcg total) by mouth daily. 07/27/15  Yes Loletha Grayer, MD  ELIQUIS 5 MG TABS tablet  Take 5 mg by mouth 2 (two) times daily.  03/22/16   [provider]    Allergies Aspirin  Family History  Problem Relation Age of Onset  . CVA Father     Social History Social History  Substance Use Topics  . Smoking status: Never Smoker  . Smokeless tobacco: Never Used  . Alcohol use No     Comment: rarely    Review of Systems Constitutional:  Negative for fever. + Dizziness Eyes: Negative for visual changes. ENT: Negative for sore throat. Neck: + neck pain  Cardiovascular: Negative for chest pain. Respiratory: Negative for shortness of breath. Gastrointestinal: Negative for abdominal pain, vomiting or diarrhea. Genitourinary: Negative for dysuria. Musculoskeletal: Negative for back pain. Skin: Negative for rash. Neurological: Negative for weakness or numbness. + HA Psych: No SI or HI  ____________________________________________   PHYSICAL EXAM:  VITAL SIGNS: ED Triage Vitals  Enc Vitals Group     BP 06/13/16 1259 (!) 159/87     Pulse Rate 06/13/16 1259 (!) 53     Resp 06/13/16 1259 18     Temp 06/13/16 1259 98.4 F (36.9 C)     Temp src --      SpO2 06/13/16 1259 97 %     Weight 06/13/16 1300 (!) 350 lb (158.8 kg)     Height 06/13/16 1300 5\' 7"  (1.702 m)     Head Circumference --      Peak Flow --      Pain Score 06/13/16 1259 0     Pain Loc --      Pain Edu? --      Excl. in [place]? --     Constitutional: Alert and oriented x 3. Well appearing and in no apparent distress. HEENT:      Head: Normocephalic and atraumatic.         Eyes: Conjunctivae are normal. Sclera is non-icteric.       Mouth/Throat: Mucous membranes are moist.       Neck: Supple with no signs of meningismus. Cardiovascular: Regular rate and rhythm. No murmurs, gallops, or rubs. 2+ symmetrical distal pulses are present in all extremities. No JVD. Respiratory: Normal respiratory effort. Lungs are clear to auscultation bilaterally. No wheezes, crackles, or rhonchi.  Gastrointestinal: Soft, non tender, and non distended with positive bowel sounds. No rebound or guarding. Genitourinary: No CVA tenderness. Musculoskeletal: Nontender with normal range of motion in all extremities. No edema, cyanosis, or erythema of extremities. Neurologic: Normal speech and language. A & O x3, PERRL, no nystagmus, CN II-XII intact, motor testing reveals good tone  and bulk throughout. There is no evidence of pronator drift or dysmetria. Muscle strength is 5/5 throughout. Deep tendon reflexes are 2+ throughout with downgoing toes. Sensory examination is intact. Gait deferred Skin: Skin is warm, dry and intact. No rash noted. Psychiatric: Mood and affect are normal. Speech and behavior are normal.  ____________________________________________   LABS (all labs ordered are listed, but only abnormal results are displayed)  Labs Reviewed  BASIC METABOLIC PANEL - Abnormal; Notable for the following:       Result Value   Potassium 3.4 (*)    Chloride 100 (*)    Glucose, Bld 110 (*)    All other components within normal limits  CBC - Abnormal; Notable for the following:    RDW 19.8 (*)    All other components within normal limits  GLUCOSE, CAPILLARY - Abnormal; Notable for the following:    Glucose-Capillary 106 (*)  All other components within normal limits  TROPONIN I  TSH  T4, FREE  URINALYSIS, COMPLETE (UACMP) WITH MICROSCOPIC  TROPONIN I  HCG, QUANTITATIVE, PREGNANCY  CBG MONITORING, ED   ____________________________________________  EKG  ED ECG REPORT I, Rudene Re, the attending physician, personally viewed and interpreted this ECG.  Sinus bradycardia, rate of 54, first-degree AV block, normal QRS and QTc intervals, normal axis, no ST elevations or depressions, flattening T waves in anterior leads. Bradycardia is new when compared to prior. ____________________________________________  RADIOLOGY  Head CT: negative  ____________________________________________   PROCEDURES  Procedure(s) performed: None Procedures Critical Care performed:  None ____________________________________________   INITIAL IMPRESSION / ASSESSMENT AND PLAN / ED COURSE  51 y.o. female with a history of a dilated cardiomyopathy (EF 45-50%), toxic goiter s/p ablation in 2015, PE/ DVT provoked by OCP no longer on Eliquis for 1 month, HTN,  obesity, anemia who presents for evaluation of high blood pressure and a headache. Patient is neurologically intact, blood pressures trending down, remaining of her vital signs are within normal limits. No meningeal signs. No evidence of stroke. Headache has only been mild with no evidence of thunderclap headache or subarachnoid hemorrhage. Head CT negative for acute intracranial pathology. We'll treat her headache with Tylenol, Reglan, Benadryl. We'll continue to monitor on telemetry. EKG with no ischemic changes. First troponin is negative. We'll get a second one. We'll also check her thyroid studies.  Clinical Course as of Jun 13 1517  Wed Jun 13, 2016  1518 HCG and repeat troponin are pending. Patient's headache has resolved. She continues to look well appearing with no neurological deficits. Remaining of her blood work with no acute findings. Care transferred to Dr. Mariea Clonts  [CV]    Clinical Course User Index [CV] Alfred Levins [place], MD    Pertinent labs & imaging results that were available during my care of the patient were reviewed by me and considered in my medical decision making (see chart for details).    ____________________________________________   FINAL CLINICAL IMPRESSION(S) / ED DIAGNOSES  Final diagnoses:  Acute nonintractable headache, unspecified headache type  Hypertension, unspecified type  Chest pain, unspecified type      NEW MEDICATIONS STARTED DURING THIS VISIT:  New Prescriptions   No medications on file     Note:  This document was prepared using Dragon voice recognition software and may include unintentional dictation errors.    Alfred Levins, [place], MD 06/15/16 [phone]

## 2016-06-13 NOTE — Discharge Instructions (Signed)

## 2016-06-13 NOTE — Telephone Encounter (Signed)
Patient came in for appointment and was sent to ER

## 2016-06-13 NOTE — Telephone Encounter (Signed)
Pt called to state that she is having some lightheadedness that started yesterday.  BP's are running 150/100 over the last few days despite taking HCTZ and Coreg. Advised she needs to come in for an appointment today. She will call back to schedule after talking with family to coordinate a ride.  Please ensure she is worked in when it is convenient for her when she calls back. Thank you!

## 2016-06-13 NOTE — Progress Notes (Addendum)
Name: Madeline Dominguez   MRN: [id number]    DOB: 08-14-1965   Date:06/13/2016       Progress Note  Subjective  Chief Complaint  Chief Complaint  Patient presents with  . Hypertension    HPI  Pt presents for BP follow up with c/o lightheadedness. She was seen on 06/05/16 for HTN and HCTZ 12.32m QAM was added to her regimen. Also taking Carvedilol 12.566mBID.  Has been taking HCTZ around 2pm - 4pm.  Works night shift 10pm-10am, sleeps 12noon until about 4pm and takes  2 hour nap in the evening before work.  Also endorses some worsening blurred vision over the course of 1-2 weeks.  Endorses fatigue and nausea, vomited yesterday x1, along with photophobia and 3/10 headache. Denies shortness of breath but has chest "tightness" intermittently x1-2 weeks lasting for a few hours to all day and then goes away on its own, random onset with no provocation. No weakness, confusion, or difficulty with speech.  -Pt brings multiple BP's from Tuesday (1 day ago), these show elevation with highest 152/107 with a slowly decreasing BP after taking Carvedilol down to 126/87. - History of thyroid ablation March 2015, last visit with Endo was 02/2015 and TSH was trending up, we will recheck levels  Patient Active Problem List   Diagnosis Date Noted  . 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 Right leg DVT ([place]/2017  . Iron deficiency anemia 07/27/2015  . B12 deficiency 07/27/2015  . Bilateral pulmonary embolism ([place]/24/[place]  . Adrenal nodule ([place]/24/2017  . Pulmonary nodules/lesions, multiple 07/25/2015  . OSA (obstructive sleep apnea) 05/18/2015  . Thyroid nodule 12/17/2014  . Heavy menstrual period 09/02/2014  . Gastroesophageal reflux disease without esophagitis 09/02/2014  . Extreme obesity 06/22/2014  . Large breasts 06/22/2014  . Anemia, iron deficiency 06/22/2014  . Uncontrolled hypertension 06/22/2014  . H/O hyperthyroidism 06/22/2014  . Right ventricular dilation  06/22/2014  . 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    Social History  Substance Use Topics  . Smoking status: Never Smoker  . Smokeless tobacco: Never Used  . Alcohol use No     Comment: rarely     Current Outpatient Prescriptions:  .  ferrous sulfate 325 (65 FE) MG tablet, Take 1 tablet (325 mg total) by mouth 2 (two) times daily with a meal., Disp: 180 tablet, Rfl: 1 .  hydrochlorothiazide (HYDRODIURIL) 12.5 MG tablet, Take 1 tablet (12.5 mg total) by mouth daily., Disp: 30 tablet, Rfl: 0 .  vitamin B-12 1000 MCG tablet, Take 1 tablet (1,000 mcg total) by mouth daily., Disp: 30 tablet, Rfl: 0 .  ELIQUIS 5 MG TABS tablet, , Disp: , Rfl:   Allergies  Allergen Reactions  . Aspirin Nausea And Vomiting    ROS  Ten systems reviewed and is negative except as mentioned in HPI  Objective  Vitals:   06/13/16 1145  BP: (!) 160/100  Pulse: 74  Resp: 18  Temp: 98.1 F (36.7 C)  TempSrc: Oral  SpO2: 94%  Weight: (!) 352 lb 8 oz (159.9 kg)  Height: 5' 7"  (1.702 m)    Body mass index is 55.21 kg/m.  Nursing Note and Vital Signs reviewed.  Physical Exam Constitutional: Patient appears well-developed and well-nourished. Obese No distress.  HEENT: head atraumatic, normocephalic, pupils equal and reactive to light, EOM's intact Cardiovascular: Normal rate, regular rhythm, S1/S2 present.  No murmur or rub heard. No BLE edema. Pulmonary/Chest: Effort  normal and breath sounds clear. No respiratory distress or retractions. Abdominal: Soft and non-tender, bowel sounds present x4 quadrants. Psychiatric: Patient has a normal mood and blunted affect. behavior is normal. Judgment and thought content normal. Neurological: he is alert and oriented to person, place, and time. No cranial nerve deficit. Coordination, balance, strength, speech and gait are normal. She is holding head in her hands and appears fatigued.   Recent Results  (from the past 2160 hour(s))  Pap IG and HPV (high risk) DNA detection     Status: Abnormal   Collection Time: 03/23/16  4:00 PM  Result Value Ref Range   HPV DNA High Risk Detected (A)     Comment: One or more High/Intermediate HPV types (16,18,31,33,35,39,45,51,52,56,58,59,66,68) was detected.                  ** Normal Reference Range: Not Detected **      HPV High Risk testing performed using the APTIMA HPV mRNA Assay.      Specimen adequacy:      Comment: SATISFACTORY.  Endocervical/transformation zone component present.   FINAL DIAGNOSIS:      Comment: - NEGATIVE FOR INTRAEPITHELIAL LESIONS OR MALIGNANCY.    COMMENTS:      Comment: This Pap test has been evaluated with computer assisted technology.   Cytotechnologist:      Comment: JLT, BS CT(ASCP) *  The Pap is a screening test for cervical cancer. It is not a  diagnostic test and is subject to false negative and false positive  results. It is most reliable when a satisfactory sample, regularly  obtained, is submitted with relevant clinical findings and history,  and when the Pap result is evaluated along with historic and current  clinical information.   COMPLETE METABOLIC PANEL WITH GFR     Status: Abnormal   Collection Time: 04/27/16 11:29 AM  Result Value Ref Range   Sodium 141 135 - 146 mmol/L   Potassium 4.2 3.5 - 5.3 mmol/L   Chloride 105 98 - 110 mmol/L   CO2 31 20 - 31 mmol/L   Glucose, Bld 104 (H) 65 - 99 mg/dL   BUN 9 7 - 25 mg/dL   Creat 0.68 0.50 - 1.10 mg/dL   Total Bilirubin 0.2 0.2 - 1.2 mg/dL   Alkaline Phosphatase 64 33 - 115 U/L   AST 11 10 - 35 U/L   ALT 10 6 - 29 U/L   Total Protein 6.6 6.1 - 8.1 g/dL   Albumin 3.5 (L) 3.6 - 5.1 g/dL   Calcium 8.6 8.6 - 10.2 mg/dL   GFR, Est African American >89 >=60 mL/min   GFR, Est Non African American >89 >=60 mL/min  Lipid panel     Status: None   Collection Time: 04/27/16 11:29 AM  Result Value Ref Range   Cholesterol 178 <200 mg/dL    Triglycerides 53 <150 mg/dL   HDL 68 >50 mg/dL   Total CHOL/HDL Ratio 2.6 <5.0 Ratio   VLDL 11 <30 mg/dL   LDL Cholesterol 99 <100 mg/dL  Hemoglobin A1c     Status: None   Collection Time: 04/27/16 11:29 AM  Result Value Ref Range   Hgb A1c MFr Bld 4.9 <5.7 %    Comment:   For the purpose of screening for the presence of diabetes:   <5.7%       Consistent with the absence of diabetes 5.7-6.4 %   Consistent with increased risk for diabetes (prediabetes) >=6.5 %  Consistent with diabetes   This assay result is consistent with a decreased risk of diabetes.   Currently, no consensus exists regarding use of hemoglobin A1c for diagnosis of diabetes in children.   According to American Diabetes Association (ADA) guidelines, hemoglobin A1c <7.0% represents optimal control in non-pregnant diabetic patients. Different metrics may apply to specific patient populations. Standards of Medical Care in Diabetes (ADA).      Mean Plasma Glucose 94 mg/dL  Insulin, fasting     Status: None   Collection Time: 04/27/16 11:29 AM  Result Value Ref Range   Insulin fasting, serum 10.3 2.0 - 19.6 uIU/mL    Comment:   This insulin assay shows strong cross-reactivity for some insulin analogs (lispro, aspart, and glargine) and much lower cross-reactivity with others (detemir, glulisine).   Stimulated Insulin reference intervals were established using the Siemens Immulite assay. These values are provided for general guidance only.   CBC with Differential     Status: Abnormal   Collection Time: 05/03/16 10:40 AM  Result Value Ref Range   WBC 7.0 3.6 - 11.0 K/uL   RBC 3.19 (L) 3.80 - 5.20 MIL/uL   Hemoglobin 8.3 (L) 12.0 - 16.0 g/dL   HCT 25.9 (L) 35.0 - 47.0 %   MCV 81.3 80.0 - 100.0 fL   MCH 25.9 (L) 26.0 - 34.0 pg   MCHC 31.9 (L) 32.0 - 36.0 g/dL   RDW 18.3 (H) 11.5 - 14.5 %   Platelets 335 150 - 440 K/uL   Neutrophils Relative % 53 %   Neutro Abs 3.8 1.4 - 6.5 K/uL   Lymphocytes Relative  35 %   Lymphs Abs 2.4 1.0 - 3.6 K/uL   Monocytes Relative 9 %   Monocytes Absolute 0.6 0.2 - 0.9 K/uL   Eosinophils Relative 3 %   Eosinophils Absolute 0.2 0 - 0.7 K/uL   Basophils Relative 0 %   Basophils Absolute 0.0 0 - 0.1 K/uL  Ferritin     Status: Abnormal   Collection Time: 05/03/16 10:40 AM  Result Value Ref Range   Ferritin 7 (L) 11 - 307 ng/mL  Iron and TIBC     Status: Abnormal   Collection Time: 05/03/16 10:40 AM  Result Value Ref Range   Iron 12 (L) 28 - 170 ug/dL   TIBC 312 250 - 450 ug/dL   Saturation Ratios 4 (L) 10.4 - 31.8 %   UIBC 300 ug/dL  CBC with Differential/Platelet     Status: Abnormal   Collection Time: 05/18/16  4:49 PM  Result Value Ref Range   WBC 6.9 3.6 - 11.0 K/uL   RBC 3.91 3.80 - 5.20 MIL/uL   Hemoglobin 10.9 (L) 12.0 - 16.0 g/dL   HCT 33.6 (L) 35.0 - 47.0 %   MCV 86.1 80.0 - 100.0 fL   MCH 27.9 26.0 - 34.0 pg   MCHC 32.4 32.0 - 36.0 g/dL   RDW 22.5 (H) 11.5 - 14.5 %   Platelets 256 150 - 440 K/uL   Neutrophils Relative % 64 %   Neutro Abs 4.5 1.4 - 6.5 K/uL   Lymphocytes Relative 25 %   Lymphs Abs 1.7 1.0 - 3.6 K/uL   Monocytes Relative 7 %   Monocytes Absolute 0.5 0.2 - 0.9 K/uL   Eosinophils Relative 3 %   Eosinophils Absolute 0.2 0 - 0.7 K/uL   Basophils Relative 1 %   Basophils Absolute 0.0 0 - 0.1 K/uL  Comprehensive metabolic panel  Status: Abnormal   Collection Time: 05/18/16  4:49 PM  Result Value Ref Range   Sodium 142 135 - 145 mmol/L   Potassium 3.8 3.5 - 5.1 mmol/L   Chloride 107 101 - 111 mmol/L   CO2 30 22 - 32 mmol/L   Glucose, Bld 99 65 - 99 mg/dL   BUN 13 6 - 20 mg/dL   Creatinine, Ser 0.73 0.44 - 1.00 mg/dL   Calcium 9.2 8.9 - 10.3 mg/dL   Total Protein 7.8 6.5 - 8.1 g/dL   Albumin 3.6 3.5 - 5.0 g/dL   AST 12 (L) 15 - 41 U/L   ALT 10 (L) 14 - 54 U/L   Alkaline Phosphatase 77 38 - 126 U/L   Total Bilirubin 0.3 0.3 - 1.2 mg/dL   GFR calc non Af Amer >60 >60 mL/min   GFR calc Af Amer >60 >60 mL/min     Comment: (NOTE) The eGFR has been calculated using the CKD EPI equation. This calculation has not been validated in all clinical situations. eGFR's persistently <60 mL/min signify possible Chronic Kidney Disease.    Anion gap 5 5 - 15  Troponin I     Status: None   Collection Time: 05/18/16  4:49 PM  Result Value Ref Range   Troponin I <0.03 <0.03 ng/mL  Brain natriuretic peptide     Status: None   Collection Time: 05/18/16  4:49 PM  Result Value Ref Range   B Natriuretic Peptide 64.0 0.0 - 100.0 pg/mL     Assessment & Plan  1. Uncontrolled hypertension  - COMPLETE METABOLIC PANEL WITH GFR  2. Lightheadedness - Considered several etiologies including cardiac event, neurologic event, new HTN medications, and thyroid issues. Due to patient's course/symptoms and unclear etiology, it is recommended she obtain emergency care. Offered EMS transport, but patient declines and will have her mother drive her to [facility] ER. Report is called to [facility].  3. Goiter, toxic, multinodular  - TSH   - Work note provided. Pt advised to keep medications the same for now unless told otherwise by the hospital physicians.  She is instructed to call to schedule a close follow up appointment for as soon as possible after she is discharged.  I have reviewed this encounter including the documentation in this note and/or discussed this patient with the Johney Maine, FNP, NP-C. I am certifying that I agree with the content of this note as supervising physician.  Steele Sizer, MD [facility] 06/13/2016, 1:17 PM

## 2016-06-14 ENCOUNTER — Telehealth: Payer: Self-pay

## 2016-06-14 DIAGNOSIS — N3001 Acute cystitis with hematuria: Secondary | ICD-10-CM

## 2016-06-14 DIAGNOSIS — I10 Essential (primary) hypertension: Secondary | ICD-10-CM

## 2016-06-14 LAB — TSH: TSH: 3.12 m[IU]/L

## 2016-06-14 MED ORDER — CIPROFLOXACIN HCL 250 MG PO TABS: 250.0000 mg | ORAL_TABLET | Freq: Two times a day (BID) | ORAL | 0 refills | Status: DC

## 2016-06-14 MED ORDER — HYDROCHLOROTHIAZIDE 25 MG PO TABS: 25.0000 mg | ORAL_TABLET | Freq: Every day | ORAL | 0 refills | Status: DC

## 2016-06-14 MED ORDER — NEBIVOLOL HCL 10 MG PO TABS: 10.0000 mg | ORAL_TABLET | Freq: Every day | ORAL | 0 refills | Status: DC

## 2016-06-14 NOTE — Progress Notes (Signed)
Normal Thyroid, CMP looks good - both were repeated in ER yesterday. Please call and ask patient how she's feeling after her ER visit yesterday. Thank you!

## 2016-06-14 NOTE — Telephone Encounter (Signed)
Patient notified

## 2016-06-14 NOTE — Telephone Encounter (Signed)
Please let patient know that she was positive for UTI at the ER yesterday. I do not see Rx for antibiotic from ER.  We will treat her - I am sending in Cipro BID x3 days for her to take. For her BP medications, advise her that we will change her medications like we discussed yesterday:  -25mg  HCTZ (She can take 2 of her 12.5mg  until they run out) each morning, I am sending in a new prescription for 25mg .  If BP is less than 140/90, she can take 12.5mg . -10mg  Bystolic each night -STOP Coreg/Carvedilol.  Please ensure she has a 1-2 week follow up and that she is checking her BP daily and bringing that with her.

## 2016-06-14 NOTE — Telephone Encounter (Signed)
Patient was advised to call and check with Dr. Ancil Boozer about her BP medication. Patient was here yesterday to see Raquel Sarna and was sent to the ER. Patient is still Lightheaded and sick to stomach, please advise what to do. Patient is on Coreg and HCTZ. [phone].

## 2016-06-14 NOTE — Telephone Encounter (Signed)
Left message for patient to call.

## 2016-06-18 ENCOUNTER — Encounter: Payer: Self-pay | Admitting: Emergency Medicine

## 2016-06-18 ENCOUNTER — Telehealth: Payer: Self-pay

## 2016-06-18 ENCOUNTER — Emergency Department
Admission: EM | Admit: 2016-06-18 | Discharge: 2016-06-18 | Disposition: A | Discharge status: Home / Self Care | Payer: 59 | Attending: Emergency Medicine | Admitting: Emergency Medicine

## 2016-06-18 DIAGNOSIS — E039 Hypothyroidism, unspecified: Secondary | ICD-10-CM | POA: Insufficient documentation

## 2016-06-18 DIAGNOSIS — R42 Dizziness and giddiness: Secondary | ICD-10-CM | POA: Diagnosis not present

## 2016-06-18 DIAGNOSIS — N3000 Acute cystitis without hematuria: Secondary | ICD-10-CM | POA: Diagnosis not present

## 2016-06-18 DIAGNOSIS — I10 Essential (primary) hypertension: Secondary | ICD-10-CM | POA: Diagnosis not present

## 2016-06-18 DIAGNOSIS — R0602 Shortness of breath: Secondary | ICD-10-CM

## 2016-06-18 DIAGNOSIS — R001 Bradycardia, unspecified: Secondary | ICD-10-CM | POA: Diagnosis not present

## 2016-06-18 LAB — BASIC METABOLIC PANEL
Anion gap: 6 (ref 5–15)
BUN: 13 mg/dL (ref 6–20)
CALCIUM: 9.2 mg/dL (ref 8.9–10.3)
CO2: 30 mmol/L (ref 22–32)
CREATININE: 0.86 mg/dL (ref 0.44–1.00)
Chloride: 100 mmol/L — ABNORMAL LOW (ref 101–111)
Glucose, Bld: 114 mg/dL — ABNORMAL HIGH (ref 65–99)
Potassium: 3.4 mmol/L — ABNORMAL LOW (ref 3.5–5.1)
Sodium: 136 mmol/L (ref 135–145)

## 2016-06-18 LAB — URINALYSIS, COMPLETE (UACMP) WITH MICROSCOPIC
Bilirubin Urine: NEGATIVE
GLUCOSE, UA: NEGATIVE mg/dL
Ketones, ur: NEGATIVE mg/dL
Nitrite: NEGATIVE
PH: 6 (ref 5.0–8.0)
Protein, ur: NEGATIVE mg/dL
Specific Gravity, Urine: 1.012 (ref 1.005–1.030)

## 2016-06-18 LAB — CBC
HCT: 38.1 % (ref 35.0–47.0)
Hemoglobin: 12.5 g/dL (ref 12.0–16.0)
MCH: 28 pg (ref 26.0–34.0)
MCHC: 32.8 g/dL (ref 32.0–36.0)
MCV: 85.4 fL (ref 80.0–100.0)
PLATELETS: 247 10*3/uL (ref 150–440)
RBC: 4.46 MIL/uL (ref 3.80–5.20)
RDW: 19.4 % — ABNORMAL HIGH (ref 11.5–14.5)
WBC: 5.5 10*3/uL (ref 3.6–11.0)

## 2016-06-18 LAB — TROPONIN I: Troponin I: <0.03 ng/mL (ref <0.03)

## 2016-06-18 LAB — APTT: aPTT: 27 seconds (ref 24–36)

## 2016-06-18 MED ORDER — POTASSIUM CHLORIDE CRYS ER 20 MEQ PO TBCR
40.0000 meq | EXTENDED_RELEASE_TABLET | Freq: Once | ORAL | Status: AC
  Administered 2016-06-18: 40 meq via ORAL
  Filled 2016-06-18: qty 2

## 2016-06-18 MED ORDER — CEPHALEXIN 500 MG PO CAPS: 500.0000 mg | ORAL_CAPSULE | Freq: Four times a day (QID) | ORAL | 0 refills | Status: DC

## 2016-06-18 MED ORDER — CEPHALEXIN 500 MG PO CAPS
500.0000 mg | ORAL_CAPSULE | Freq: Once | ORAL | Status: AC
  Administered 2016-06-18: 500 mg via ORAL
  Filled 2016-06-18: qty 1

## 2016-06-18 NOTE — ED Provider Notes (Signed)
[facility] Emergency Department Provider Note  ____________________________________________  Time seen: Approximately 12:21 PM  I have reviewed the triage vital signs and the nursing notes.   HISTORY  Chief Complaint Near Syncope    HPI Madeline Dominguez is a 51 y.o. female with a history of dilated cardiomyopathy, morbid obesity, DVT having completed her anticoagulation,HTN, presenting for lightheadedness. The patient states that when she was ambulate and back to her bed from the bathroom this morning, she developed a lightheaded sensation and shortness of breath which persisted while she was laying down. She then had a coughing spasm productive of clear sputum, which is not unusual for her. She had associated mild shortness of breath. She denies any nausea vomiting or diarrhea. She has recently had multiple medication changes with her primary care physician.  No fever or chills.   Past Medical History:  Diagnosis Date  . Cough 07/25/2015  . Dilated cardiomyopathy ([place])   . H/O: hypothyroidism   . Heart palpitations   . Hypertension   . Iron deficiency   . Large breasts   . Morbid obesity ([place])   . Postpartum hemorrhage   . Thyroid disease     Patient Active Problem List   Diagnosis Date Noted  . 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 Right leg DVT ([place]) 08/01/2015  . Iron deficiency anemia 07/27/2015  . B12 deficiency 07/27/2015  . Bilateral pulmonary embolism ([place]) 07/25/2015  . Adrenal nodule ([place]) 07/25/2015  . Pulmonary nodules/lesions, multiple 07/25/2015  . OSA (obstructive sleep apnea) 05/18/2015  . Thyroid nodule 12/17/2014  . Heavy menstrual period 09/02/2014  . Gastroesophageal reflux disease without esophagitis 09/02/2014  . Extreme obesity 06/22/2014  . Large breasts 06/22/2014  . Anemia, iron deficiency 06/22/2014  . Uncontrolled hypertension 06/22/2014  . H/O hyperthyroidism 06/22/2014  . Right ventricular  dilation 06/22/2014  . 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    Past Surgical History:  Procedure Laterality Date  . CESAREAN SECTION      Current Outpatient Rx  . Order #: [id number] Class: Print  . Order #: [id number] Class: Normal  . Order #: [id number] Class: Historical Med  . Order #: [id number] Class: Normal  . Order #: [id number] Class: Normal  . Order #: [id number] Class: Normal  . Order #: [id number] Class: Print    Allergies Aspirin  Family History  Problem Relation Age of Onset  . CVA Father     Social History Social History  Substance Use Topics  . Smoking status: Never Smoker  . Smokeless tobacco: Never Used  . Alcohol use No     Comment: rarely    Review of Systems Constitutional: No fever/chills.Positive lightheadedness. No syncope. No diaphoresis. Eyes: No visual changes. No blurred or double vision. ENT: No sore throat. No congestion or rhinorrhea. Cardiovascular: Denies chest pain. Denies palpitations. Respiratory: Positive shortness of breath.  No cough. Gastrointestinal: No abdominal pain.  No nausea, no vomiting.  No diarrhea.  No constipation. Genitourinary: Negative for dysuria. Musculoskeletal: Negative for back pain. No lower extremity swelling. No calf pain. Skin: Negative for rash. Neurological: Negative for headaches. No focal numbness, tingling or weakness. No visual or speech changes.    ____________________________________________   PHYSICAL EXAM:  VITAL SIGNS: ED Triage Vitals  Enc Vitals Group     BP 06/18/16 0919 (!) 142/77     Pulse Rate 06/18/16 0919 (!) 50     Resp 06/18/16 0919 18     Temp 06/18/16 0919 98.2 F (  36.8 C)     Temp Source 06/18/16 0919 Oral     SpO2 06/18/16 0919 100 %     Weight 06/18/16 0919 (!) 350 lb (158.8 kg)     Height 06/18/16 0919 5\' 7"  (1.702 m)     Head Circumference --      Peak Flow --      Pain Score 06/18/16 0918 0     Pain Loc --      Pain  Edu? --      Excl. in [place]? --     Constitutional: Alert and oriented. Chronically ill-appearing and in no acute distress. Answers questions appropriately. Eyes: Conjunctivae are normal.  EOMI. PERRLA. No scleral icterus. Head: Atraumatic. Nose: No congestion/rhinnorhea. Mouth/Throat: Mucous membranes are moist.  Neck: No stridor.  Supple.  No JVD. No meningismus. Cardiovascular: Normal rate, regular rhythm. No murmurs, rubs or gallops.  Respiratory: Normal respiratory effort.  No accessory muscle use or retractions. Lungs CTAB.  No wheezes, rales or ronchi. Gastrointestinal: Morbidly obese. Soft, nontender and nondistended.  No guarding or rebound.  No peritoneal signs. Musculoskeletal: No LE edema. No ttp in the calves or palpable cords.  Negative Homan's sign. Neurologic:  A&Ox3.  Speech is clear.  Face and smile are symmetric.  EOMI.  PERRLA. Moves all extremities well. Skin:  Skin is warm, dry and intact. No rash noted. Psychiatric: Mood and affect are normal. Speech and behavior are normal.  Normal judgement.  ____________________________________________   LABS (all labs ordered are listed, but only abnormal results are displayed)  Labs Reviewed  BASIC METABOLIC PANEL - Abnormal; Notable for the following:       Result Value   Potassium 3.4 (*)    Chloride 100 (*)    Glucose, Bld 114 (*)    All other components within normal limits  CBC - Abnormal; Notable for the following:    RDW 19.4 (*)    All other components within normal limits  URINALYSIS, COMPLETE (UACMP) WITH MICROSCOPIC - Abnormal; Notable for the following:    Color, Urine YELLOW (*)    APPearance HAZY (*)    Hgb urine dipstick MODERATE (*)    Leukocytes, UA SMALL (*)    Bacteria, UA RARE (*)    Squamous Epithelial / LPF 6-30 (*)    All other components within normal limits  URINE CULTURE  TROPONIN I  APTT  CBG MONITORING, ED   ____________________________________________  EKG  ED ECG REPORT I,  Eula Listen, the attending physician, personally viewed and interpreted this ECG.   Date: 06/18/2016  EKG Time: 923  Rate: 50  Rhythm: sinus bradycardia  Axis: leftward  Intervals:first-degree A-V block   ST&T Change: No STEMI  ____________________________________________  RADIOLOGY  No results found.  ____________________________________________   PROCEDURES  Procedure(s) performed: None  Procedures  Critical Care performed: No ____________________________________________   INITIAL IMPRESSION / ASSESSMENT AND PLAN / ED COURSE  Pertinent labs & imaging results that were available during my care of the patient were reviewed by me and considered in my medical decision making (see chart for details).  51 y.o. female with multiple medical co-morbidities presenting with lightheadedness in the setting of recent medication changes. Overall, the patient does have some bradycardia on arrival here. She does not have ischemic changes. She does have cardiomyopathy, but no evidence of acute CHF exacerbation today. I'll plan to get orthostatics, basic laboratory studies, and reevaluate the patient. If her ED workup is reassuring, we'll have her follow-up with her  primary care physician for medication reconciliation.  ----------------------------------------- 2:54 PM on 06/18/2016 -----------------------------------------  After intravenous fluids, the patient is feeling significantly better. She does continue to have sinus bradycardia, but this is likely iatrogenic due to her new beta blocker. I have asked her to stop her beta blocker and continue her HCTZ.  She will follow-up with her primary care physician for continued management of her hypertension.  In addition, the patient is on her last tablet of ciprofloxacin for treatment of UTI, and appears to continue to still have a urinary tract infection today, I have sent her urine for culture, and we'll switch her to Keflex.  I  spoken to her primary care physician, Dr. Ancil Boozer, who is aware of the patient's ED visit and clinical course here. She agrees with the plan and will see the patient at her previously scheduled appointment on Wednesday. He understands return precautions as well as follow-up instructions. ____________________________________________  FINAL CLINICAL IMPRESSION(S) / ED DIAGNOSES  Final diagnoses:  Acute cystitis without hematuria  Lightheadedness  Shortness of breath  Symptomatic bradycardia         NEW MEDICATIONS STARTED DURING THIS VISIT:  New Prescriptions   CEPHALEXIN (KEFLEX) 500 MG CAPSULE    Take 1 capsule (500 mg total) by mouth 4 (four) times daily.      Eula Listen, MD 06/18/16 1455

## 2016-06-18 NOTE — Telephone Encounter (Signed)
How is she doing? I told her to take half pill of HCTZ in am's to see if dizziness would improve. She needs to be seen by me or Raquel Sarna this week

## 2016-06-18 NOTE — ED Notes (Signed)
Pt discharged home after verbalizing understanding of discharge instructions; nad noted. 

## 2016-06-18 NOTE — Telephone Encounter (Signed)
Patient called Call a Nurse and stated her BP medication is making her lightheaded about 5 hours after she takes it. The Call back Nurse Ocie Cornfield, RN advised patient to see physician within 24 hours. Please advise.

## 2016-06-18 NOTE — ED Triage Notes (Signed)
Pt reports getting up to bathroom this morning and feeling faint. Denies LOC. Pt appears drowsy in triage. Alert and oriented. Denies pain. No apparent distress noted.

## 2016-06-18 NOTE — ED Notes (Signed)
Pt presents with weakness x 3 weeks; she felt lightheaded this morning. Stated her bp was 113/60's, which is low for her. She has been working with her PCP to adjust bp meds. She reports that the left side of her face began to tingle this morning. She states she is also coughing up phlegm every morning as well. NAD noted.

## 2016-06-18 NOTE — Telephone Encounter (Signed)
Patient is in the hospital and due to her heart rate being low to cut the Bystolic in half every evening and the HCTZ in half every morning. Then to come back to see Dr. Ancil Boozer in a week.

## 2016-06-18 NOTE — Discharge Instructions (Signed)
Please take the entire course of antibiotics, even if you're feeling better. Drink plenty of fluid to stay well hydrated, and to help clear your urinary tract infection.  Please do not take your Bystolic until you are able to talk to your primary care physician about your blood pressure medications. This medication is making your heart rate low, which is likely why you're feeling lightheaded. Your HCTZ as prescribed.  Return to the emergency department if you develop vomiting, fever, severe pain, lightheadedness or fainting, or any other symptoms concerning to you.

## 2016-06-19 ENCOUNTER — Telehealth: Payer: Self-pay | Admitting: Family Medicine

## 2016-06-19 ENCOUNTER — Telehealth: Payer: Self-pay

## 2016-06-19 DIAGNOSIS — R001 Bradycardia, unspecified: Secondary | ICD-10-CM | POA: Diagnosis not present

## 2016-06-19 DIAGNOSIS — R05 Cough: Secondary | ICD-10-CM | POA: Diagnosis not present

## 2016-06-19 DIAGNOSIS — M542 Cervicalgia: Secondary | ICD-10-CM | POA: Diagnosis not present

## 2016-06-19 DIAGNOSIS — R0789 Other chest pain: Secondary | ICD-10-CM | POA: Diagnosis not present

## 2016-06-19 DIAGNOSIS — R51 Headache: Secondary | ICD-10-CM | POA: Diagnosis not present

## 2016-06-19 NOTE — Telephone Encounter (Signed)
Pt requesting return call [phone]. Would like to know if the numbness in her chest is because of potassium

## 2016-06-19 NOTE — Telephone Encounter (Signed)
Try stopping bystolic and keep follow up tomorrow

## 2016-06-19 NOTE — Telephone Encounter (Signed)
Patient states her BP is 113/63 and HR is 56 with taking half of Bystolic and 1/2 HCTZ. But she still cloudy headed and having muscle cramps. The hospital gave patient potassium due to muscle cramps and wanted to know what else she can do.

## 2016-06-20 ENCOUNTER — Ambulatory Visit (INDEPENDENT_AMBULATORY_CARE_PROVIDER_SITE_OTHER): Payer: 59 | Admitting: Family Medicine

## 2016-06-20 ENCOUNTER — Other Ambulatory Visit
Admission: RE | Admit: 2016-06-20 | Discharge: 2016-06-20 | Disposition: A | Discharge status: Home / Self Care | Payer: 59 | Source: Ambulatory Visit | Attending: Family Medicine | Admitting: Family Medicine

## 2016-06-20 ENCOUNTER — Ambulatory Visit: Payer: 59 | Admitting: Family Medicine

## 2016-06-20 ENCOUNTER — Encounter: Payer: Self-pay | Admitting: Family Medicine

## 2016-06-20 VITALS — BP 154/76 | HR 79 | Temp 98.3°F | Resp 16 | Ht 67.0 in | Wt 339.3 lb

## 2016-06-20 DIAGNOSIS — N3001 Acute cystitis with hematuria: Secondary | ICD-10-CM

## 2016-06-20 DIAGNOSIS — R0602 Shortness of breath: Secondary | ICD-10-CM | POA: Insufficient documentation

## 2016-06-20 DIAGNOSIS — Z86711 Personal history of pulmonary embolism: Secondary | ICD-10-CM | POA: Insufficient documentation

## 2016-06-20 DIAGNOSIS — I10 Essential (primary) hypertension: Secondary | ICD-10-CM | POA: Diagnosis not present

## 2016-06-20 LAB — URINE CULTURE

## 2016-06-20 LAB — FIBRIN DERIVATIVES D-DIMER (ARMC ONLY): FIBRIN DERIVATIVES D-DIMER (ARMC): 386.7 (ref 0.00–499.00)

## 2016-06-20 NOTE — Progress Notes (Signed)
Name: Madeline Dominguez   MRN: [id number]    DOB: 05/27/1965   Date:06/20/2016       Progress Note  Subjective  Chief Complaint  Chief Complaint  Patient presents with  . Hypertension    HPI  HTN/dizziness: since May 2018 she has gone to [facility] multiple times. It was initially for short episodes of SOB, but later discovered that she was having SOB when bp was spiking. We adjusted her medications, but when on HCTZ and Bystolic she developed dizziness, bp was low in the 100. She states that when bp is below 140 she feels dizzy ( lightheaded). She has episodes of chest tingling, cough, weakness. The only abnormal finding when at [facility] was hypokalemia , that has normalized ( based on labs done yesterday - done at [facility]). She has a history of DVT and PE and has been off Eliquis since May 2018. Currently no leg pain or dizziness, denies chest pain or palpitation. SOB only with activity. No orthopnea or leg swelling. She states she gets anxious when bp goes up but not when bp is down.    Patient Active Problem List   Diagnosis Date Noted  . 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 Right leg DVT ([place]) 08/01/2015  . Iron deficiency anemia 07/27/2015  . B12 deficiency 07/27/2015  . Bilateral pulmonary embolism ([place]) 07/25/2015  . Adrenal nodule ([place]) 07/25/2015  . Pulmonary nodules/lesions, multiple 07/25/2015  . OSA (obstructive sleep apnea) 05/18/2015  . Thyroid nodule 12/17/2014  . Heavy menstrual period 09/02/2014  . Gastroesophageal reflux disease without esophagitis 09/02/2014  . Extreme obesity 06/22/2014  . Large breasts 06/22/2014  . Anemia, iron deficiency 06/22/2014  . Uncontrolled hypertension 06/22/2014  . H/O hyperthyroidism 06/22/2014  . Right ventricular dilation 06/22/2014  . 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    Past Surgical History:  Procedure Laterality Date  . CESAREAN SECTION      Family History  Problem  Relation Age of Onset  . CVA Father     Social History   Social History  . Marital status: Married    Spouse name: N/A  . Number of children: N/A  . Years of education: N/A   Occupational History  . Not on file.   Social History Main Topics  . Smoking status: Never Smoker  . Smokeless tobacco: Never Used  . Alcohol use No     Comment: rarely  . Drug use: No  . Sexual activity: Yes    Partners: Male    Birth control/ protection: Condom   Other Topics Concern  . Not on file   Social History Narrative   Lives at home with husband.     Current Outpatient Prescriptions:  .  cyclobenzaprine (FLEXERIL) 10 MG tablet, Take 10 mg by mouth., Disp: , Rfl:  .  cephALEXin (KEFLEX) 500 MG capsule, Take 1 capsule (500 mg total) by mouth 4 (four) times daily., Disp: 20 capsule, Rfl: 0 .  ELIQUIS 5 MG TABS tablet, Take 5 mg by mouth 2 (two) times daily. , Disp: , Rfl:  .  ferrous sulfate 325 (65 FE) MG tablet, Take 1 tablet (325 mg total) by mouth 2 (two) times daily with a meal., Disp: 180 tablet, Rfl: 1 .  hydrochlorothiazide (HYDRODIURIL) 12.5 MG tablet, Take 1 tablet (12.5 mg total) by mouth daily., Disp: 30 tablet, Rfl: 0 .  vitamin B-12 1000 MCG tablet, Take 1 tablet (1,000 mcg total) by mouth daily.,  Disp: 30 tablet, Rfl: 0  Allergies  Allergen Reactions  . Aspirin Nausea And Vomiting     ROS  Ten systems reviewed and is negative except as mentioned in HPI   Objective  Vitals:   06/20/16 1242  BP: (!) 154/76  Pulse: 79  Resp: 16  Temp: 98.3 F (36.8 C)  TempSrc: Oral  SpO2: 92%  Weight: (!) 339 lb 5 oz (153.9 kg)  Height: _0  (1.702 m)    Body mass index is 53.14 kg/m.  Physical Exam  Constitutional: Patient appears well-developed and well-nourished. Obese No distress.  HEENT: head atraumatic, normocephalic, pupils equal and reactive to light, neck supple, throat within normal limits Cardiovascular: Normal rate, regular rhythm and normal heart sounds.   No murmur heard. No BLE edema. Pulmonary/Chest: Effort normal and breath sounds normal. No respiratory distress. Abdominal: Soft.  There is no tenderness. Psychiatric: Patient has a normal mood and affect. behavior is normal. Judgment and thought content normal. Neurological : no focal findings.   Recent Results (from the past 2160 hour(s))  Pap IG and HPV (high risk) DNA detection     Status: Abnormal   Collection Time: 03/23/16  4:00 PM  Result Value Ref Range   HPV DNA High Risk Detected (A)     Comment: One or more High/Intermediate HPV types (16,18,31,33,35,39,45,51,52,56,58,59,66,68) was detected.                  ** Normal Reference Range: Not Detected **      HPV High Risk testing performed using the APTIMA HPV mRNA Assay.      Specimen adequacy:      Comment: SATISFACTORY.  Endocervical/transformation zone component present.   FINAL DIAGNOSIS:      Comment: - NEGATIVE FOR INTRAEPITHELIAL LESIONS OR MALIGNANCY.    COMMENTS:      Comment: This Pap test has been evaluated with computer assisted technology.   Cytotechnologist:      Comment: JLT, BS CT(ASCP) *  The Pap is a screening test for cervical cancer. It is not a  diagnostic test and is subject to false negative and false positive  results. It is most reliable when a satisfactory sample, regularly  obtained, is submitted with relevant clinical findings and history,  and when the Pap result is evaluated along with historic and current  clinical information.   COMPLETE METABOLIC PANEL WITH GFR     Status: Abnormal   Collection Time: 04/27/16 11:29 AM  Result Value Ref Range   Sodium 141 135 - 146 mmol/L   Potassium 4.2 3.5 - 5.3 mmol/L   Chloride 105 98 - 110 mmol/L   CO2 31 20 - 31 mmol/L   Glucose, Bld 104 (H) 65 - 99 mg/dL   BUN 9 7 - 25 mg/dL   Creat 0.68 0.50 - 1.10 mg/dL   Total Bilirubin 0.2 0.2 - 1.2 mg/dL   Alkaline Phosphatase 64 33 - 115 U/L   AST 11 10 - 35 U/L   ALT 10 6 - 29 U/L   Total  Protein 6.6 6.1 - 8.1 g/dL   Albumin 3.5 (L) 3.6 - 5.1 g/dL   Calcium 8.6 8.6 - 10.2 mg/dL   GFR, Est African American >89 >=60 mL/min   GFR, Est Non African American >89 >=60 mL/min  Lipid panel     Status: None   Collection Time: 04/27/16 11:29 AM  Result Value Ref Range   Cholesterol 178 <200 mg/dL   Triglycerides 53 <150 mg/dL  HDL 68 >50 mg/dL   Total CHOL/HDL Ratio 2.6 <5.0 Ratio   VLDL 11 <30 mg/dL   LDL Cholesterol 99 <100 mg/dL  Hemoglobin A1c     Status: None   Collection Time: 04/27/16 11:29 AM  Result Value Ref Range   Hgb A1c MFr Bld 4.9 <5.7 %    Comment:   For the purpose of screening for the presence of diabetes:   <5.7%       Consistent with the absence of diabetes 5.7-6.4 %   Consistent with increased risk for diabetes (prediabetes) >=6.5 %     Consistent with diabetes   This assay result is consistent with a decreased risk of diabetes.   Currently, no consensus exists regarding use of hemoglobin A1c for diagnosis of diabetes in children.   According to American Diabetes Association (ADA) guidelines, hemoglobin A1c <7.0% represents optimal control in non-pregnant diabetic patients. Different metrics may apply to specific patient populations. Standards of Medical Care in Diabetes (ADA).      Mean Plasma Glucose 94 mg/dL  Insulin, fasting     Status: None   Collection Time: 04/27/16 11:29 AM  Result Value Ref Range   Insulin fasting, serum 10.3 2.0 - 19.6 uIU/mL    Comment:   This insulin assay shows strong cross-reactivity for some insulin analogs (lispro, aspart, and glargine) and much lower cross-reactivity with others (detemir, glulisine).   Stimulated Insulin reference intervals were established using the Siemens Immulite assay. These values are provided for general guidance only.   CBC with Differential     Status: Abnormal   Collection Time: 05/03/16 10:40 AM  Result Value Ref Range   WBC 7.0 3.6 - 11.0 K/uL   RBC 3.19 (L) 3.80 - 5.20  MIL/uL   Hemoglobin 8.3 (L) 12.0 - 16.0 g/dL   HCT 25.9 (L) 35.0 - 47.0 %   MCV 81.3 80.0 - 100.0 fL   MCH 25.9 (L) 26.0 - 34.0 pg   MCHC 31.9 (L) 32.0 - 36.0 g/dL   RDW 18.3 (H) 11.5 - 14.5 %   Platelets 335 150 - 440 K/uL   Neutrophils Relative % 53 %   Neutro Abs 3.8 1.4 - 6.5 K/uL   Lymphocytes Relative 35 %   Lymphs Abs 2.4 1.0 - 3.6 K/uL   Monocytes Relative 9 %   Monocytes Absolute 0.6 0.2 - 0.9 K/uL   Eosinophils Relative 3 %   Eosinophils Absolute 0.2 0 - 0.7 K/uL   Basophils Relative 0 %   Basophils Absolute 0.0 0 - 0.1 K/uL  Ferritin     Status: Abnormal   Collection Time: 05/03/16 10:40 AM  Result Value Ref Range   Ferritin 7 (L) 11 - 307 ng/mL  Iron and TIBC     Status: Abnormal   Collection Time: 05/03/16 10:40 AM  Result Value Ref Range   Iron 12 (L) 28 - 170 ug/dL   TIBC 312 250 - 450 ug/dL   Saturation Ratios 4 (L) 10.4 - 31.8 %   UIBC 300 ug/dL  CBC with Differential/Platelet     Status: Abnormal   Collection Time: 05/18/16  4:49 PM  Result Value Ref Range   WBC 6.9 3.6 - 11.0 K/uL   RBC 3.91 3.80 - 5.20 MIL/uL   Hemoglobin 10.9 (L) 12.0 - 16.0 g/dL   HCT 33.6 (L) 35.0 - 47.0 %   MCV 86.1 80.0 - 100.0 fL   MCH 27.9 26.0 - 34.0 pg   MCHC 32.4 32.0 -  36.0 g/dL   RDW 22.5 (H) 11.5 - 14.5 %   Platelets 256 150 - 440 K/uL   Neutrophils Relative % 64 %   Neutro Abs 4.5 1.4 - 6.5 K/uL   Lymphocytes Relative 25 %   Lymphs Abs 1.7 1.0 - 3.6 K/uL   Monocytes Relative 7 %   Monocytes Absolute 0.5 0.2 - 0.9 K/uL   Eosinophils Relative 3 %   Eosinophils Absolute 0.2 0 - 0.7 K/uL   Basophils Relative 1 %   Basophils Absolute 0.0 0 - 0.1 K/uL  Comprehensive metabolic panel     Status: Abnormal   Collection Time: 05/18/16  4:49 PM  Result Value Ref Range   Sodium 142 135 - 145 mmol/L   Potassium 3.8 3.5 - 5.1 mmol/L   Chloride 107 101 - 111 mmol/L   CO2 30 22 - 32 mmol/L   Glucose, Bld 99 65 - 99 mg/dL   BUN 13 6 - 20 mg/dL   Creatinine, Ser 0.73 0.44 -  1.00 mg/dL   Calcium 9.2 8.9 - 10.3 mg/dL   Total Protein 7.8 6.5 - 8.1 g/dL   Albumin 3.6 3.5 - 5.0 g/dL   AST 12 (L) 15 - 41 U/L   ALT 10 (L) 14 - 54 U/L   Alkaline Phosphatase 77 38 - 126 U/L   Total Bilirubin 0.3 0.3 - 1.2 mg/dL   GFR calc non Af Amer >60 >60 mL/min   GFR calc Af Amer >60 >60 mL/min    Comment: (NOTE) The eGFR has been calculated using the CKD EPI equation. This calculation has not been validated in all clinical situations. eGFR's persistently <60 mL/min signify possible Chronic Kidney Disease.    Anion gap 5 5 - 15  Troponin I     Status: None   Collection Time: 05/18/16  4:49 PM  Result Value Ref Range   Troponin I <0.03 <0.03 ng/mL  Brain natriuretic peptide     Status: None   Collection Time: 05/18/16  4:49 PM  Result Value Ref Range   B Natriuretic Peptide 64.0 0.0 - 100.0 pg/mL  COMPLETE METABOLIC PANEL WITH GFR     Status: Abnormal   Collection Time: 06/13/16 12:31 PM  Result Value Ref Range   Sodium 140 135 - 146 mmol/L   Potassium 3.9 3.5 - 5.3 mmol/L   Chloride 101 98 - 110 mmol/L   CO2 32 (H) 20 - 31 mmol/L   Glucose, Bld 106 (H) 65 - 99 mg/dL   BUN 9 7 - 25 mg/dL   Creat 0.69 0.50 - 1.10 mg/dL   Total Bilirubin 0.4 0.2 - 1.2 mg/dL   Alkaline Phosphatase 78 33 - 115 U/L   AST 8 (L) 10 - 35 U/L   ALT 8 6 - 29 U/L   Total Protein 6.8 6.1 - 8.1 g/dL   Albumin 3.6 3.6 - 5.1 g/dL   Calcium 9.5 8.6 - 10.2 mg/dL   GFR, Est African American >89 >=60 mL/min   GFR, Est Non African American >89 >=60 mL/min  TSH     Status: None   Collection Time: 06/13/16 12:31 PM  Result Value Ref Range   TSH 3.12 mIU/L    Comment:   Reference Range   > or = 20 Years  0.40-4.50   Pregnancy Range First trimester  0.26-2.66 Second trimester 0.55-2.73 Third trimester  0.43-2.91     Basic metabolic panel     Status: Abnormal   Collection Time: 06/13/16  1:02 PM  Result Value Ref Range   Sodium 137 135 - 145 mmol/L   Potassium 3.4 (L) 3.5 - 5.1 mmol/L    Chloride 100 (L) 101 - 111 mmol/L   CO2 30 22 - 32 mmol/L   Glucose, Bld 110 (H) 65 - 99 mg/dL   BUN 9 6 - 20 mg/dL   Creatinine, Ser 0.54 0.44 - 1.00 mg/dL   Calcium 9.5 8.9 - 10.3 mg/dL   GFR calc non Af Amer >60 >60 mL/min   GFR calc Af Amer >60 >60 mL/min    Comment: (NOTE) The eGFR has been calculated using the CKD EPI equation. This calculation has not been validated in all clinical situations. eGFR's persistently <60 mL/min signify possible Chronic Kidney Disease.    Anion gap 7 5 - 15  CBC     Status: Abnormal   Collection Time: 06/13/16  1:02 PM  Result Value Ref Range   WBC 5.3 3.6 - 11.0 K/uL   RBC 4.22 3.80 - 5.20 MIL/uL   Hemoglobin 12.0 12.0 - 16.0 g/dL   HCT 36.3 35.0 - 47.0 %   MCV 86.0 80.0 - 100.0 fL   MCH 28.4 26.0 - 34.0 pg   MCHC 33.0 32.0 - 36.0 g/dL   RDW 19.8 (H) 11.5 - 14.5 %   Platelets 253 150 - 440 K/uL  Troponin I     Status: None   Collection Time: 06/13/16  1:02 PM  Result Value Ref Range   Troponin I <0.03 <0.03 ng/mL  TSH     Status: None   Collection Time: 06/13/16  1:02 PM  Result Value Ref Range   TSH 3.895 0.350 - 4.500 uIU/mL    Comment: Performed by a 3rd Generation assay with a functional sensitivity of <=0.01 uIU/mL.  T4, free     Status: None   Collection Time: 06/13/16  1:02 PM  Result Value Ref Range   Free T4 0.77 0.61 - 1.12 ng/dL    Comment: (NOTE) Biotin ingestion may interfere with free T4 tests. If the results are inconsistent with the TSH level, previous test results, or the clinical presentation, then consider biotin interference. If needed, order repeat testing after stopping biotin.   hCG, quantitative, pregnancy     Status: None   Collection Time: 06/13/16  1:02 PM  Result Value Ref Range   hCG, Beta Chain, Quant, S 1 <5 mIU/mL    Comment:          GEST. AGE      CONC.  (mIU/mL)   <=1 WEEK        5 - 50     2 WEEKS       50 - 500     3 WEEKS       100 - 10,000     4 WEEKS     1,000 - 30,000     5 WEEKS      3,500 - 115,000   6-8 WEEKS     12,000 - 270,000    12 WEEKS     15,000 - 220,000        FEMALE AND NON-PREGNANT FEMALE:     LESS THAN 5 mIU/mL   Glucose, capillary     Status: Abnormal   Collection Time: 06/13/16  1:40 PM  Result Value Ref Range   Glucose-Capillary 106 (H) 65 - 99 mg/dL  Urinalysis, Complete w Microscopic     Status: Abnormal   Collection Time: 06/13/16  4:27  PM  Result Value Ref Range   Color, Urine YELLOW (A) YELLOW   APPearance HAZY (A) CLEAR   Specific Gravity, Urine 1.013 1.005 - 1.030   pH 6.0 5.0 - 8.0   Glucose, UA NEGATIVE NEGATIVE mg/dL   Hgb urine dipstick MODERATE (A) NEGATIVE   Bilirubin Urine NEGATIVE NEGATIVE   Ketones, ur NEGATIVE NEGATIVE mg/dL   Protein, ur NEGATIVE NEGATIVE mg/dL   Nitrite POSITIVE (A) NEGATIVE   Leukocytes, UA TRACE (A) NEGATIVE   RBC / HPF 0-5 0 - 5 RBC/hpf   WBC, UA 0-5 0 - 5 WBC/hpf   Bacteria, UA MANY (A) NONE SEEN   Squamous Epithelial / LPF 0-5 (A) NONE SEEN   Mucous PRESENT   Troponin I     Status: None   Collection Time: 06/13/16  4:27 PM  Result Value Ref Range   Troponin I <0.03 <0.03 ng/mL  Basic metabolic panel     Status: Abnormal   Collection Time: 06/18/16  9:36 AM  Result Value Ref Range   Sodium 136 135 - 145 mmol/L   Potassium 3.4 (L) 3.5 - 5.1 mmol/L   Chloride 100 (L) 101 - 111 mmol/L   CO2 30 22 - 32 mmol/L   Glucose, Bld 114 (H) 65 - 99 mg/dL   BUN 13 6 - 20 mg/dL   Creatinine, Ser 0.86 0.44 - 1.00 mg/dL   Calcium 9.2 8.9 - 10.3 mg/dL   GFR calc non Af Amer >60 >60 mL/min   GFR calc Af Amer >60 >60 mL/min    Comment: (NOTE) The eGFR has been calculated using the CKD EPI equation. This calculation has not been validated in all clinical situations. eGFR's persistently <60 mL/min signify possible Chronic Kidney Disease.    Anion gap 6 5 - 15  CBC     Status: Abnormal   Collection Time: 06/18/16  9:36 AM  Result Value Ref Range   WBC 5.5 3.6 - 11.0 K/uL   RBC 4.46 3.80 - 5.20 MIL/uL    Hemoglobin 12.5 12.0 - 16.0 g/dL   HCT 38.1 35.0 - 47.0 %   MCV 85.4 80.0 - 100.0 fL   MCH 28.0 26.0 - 34.0 pg   MCHC 32.8 32.0 - 36.0 g/dL   RDW 19.4 (H) 11.5 - 14.5 %   Platelets 247 150 - 440 K/uL  Troponin I     Status: None   Collection Time: 06/18/16  9:56 AM  Result Value Ref Range   Troponin I <0.03 <0.03 ng/mL  APTT     Status: None   Collection Time: 06/18/16  9:56 AM  Result Value Ref Range   aPTT 27 24 - 36 seconds  Urinalysis, Complete w Microscopic     Status: Abnormal   Collection Time: 06/18/16  2:20 PM  Result Value Ref Range   Color, Urine YELLOW (A) YELLOW   APPearance HAZY (A) CLEAR   Specific Gravity, Urine 1.012 1.005 - 1.030   pH 6.0 5.0 - 8.0   Glucose, UA NEGATIVE NEGATIVE mg/dL   Hgb urine dipstick MODERATE (A) NEGATIVE   Bilirubin Urine NEGATIVE NEGATIVE   Ketones, ur NEGATIVE NEGATIVE mg/dL   Protein, ur NEGATIVE NEGATIVE mg/dL   Nitrite NEGATIVE NEGATIVE   Leukocytes, UA SMALL (A) NEGATIVE   RBC / HPF 0-5 0 - 5 RBC/hpf   WBC, UA 6-30 0 - 5 WBC/hpf   Bacteria, UA RARE (A) NONE SEEN   Squamous Epithelial / LPF 6-30 (A) NONE SEEN  Mucous PRESENT   Urine culture     Status: Abnormal   Collection Time: 06/18/16  2:20 PM  Result Value Ref Range   Specimen Description URINE, RANDOM    Special Requests NONE    Culture MULTIPLE SPECIES PRESENT, SUGGEST RECOLLECTION (A)    Report Status 06/20/2016 FINAL       PHQ2/9: Depression screen [facility] 2/9 12/20/2015 09/02/2015 03/04/2015 12/03/2014 09/02/2014  Decreased Interest 0 0 0 0 0  Down, Depressed, Hopeless 0 0 0 0 0  PHQ - 2 Score 0 0 0 0 0     Fall Risk: Fall Risk  05/03/2016 12/20/2015 09/02/2015 03/04/2015 12/03/2014  Falls in the past year? _0      Assessment & Plan  1. Uncontrolled hypertension  She gets dizzy when bp goes down to the 130's range, explained that her body should get used to it, she will try taking half HCTZ 12.5 mg half twice daily until she does not feel weird, and  increase to 12.5 mg at night and after that twice daily, once she tolerates we will consider adding Bystolic lower dose, maybe 5 mg and monitor.   -referral nephrologist  2. Acute cystitis with hematuria  Taking Keflex four times daily, given by [facility], culture was contaminate, advised to finish course  3. SOB (shortness of breath)  - D-Dimer, Quantitative If positive we will proceed with CT chest, otherwise reassurance, likely multifactorial  4. History of pulmonary embolism  - D-Dimer, Quantitative

## 2016-06-21 ENCOUNTER — Telehealth: Payer: Self-pay | Admitting: Family Medicine

## 2016-06-21 NOTE — Telephone Encounter (Signed)
Pt would like a call back

## 2016-06-21 NOTE — Telephone Encounter (Signed)
Pt is having side effect from hydrochlorothiazide and would like to know if Dr Ancil Boozer could just call in something different to [facility]

## 2016-06-22 ENCOUNTER — Encounter: Payer: Self-pay | Admitting: Family Medicine

## 2016-06-22 ENCOUNTER — Ambulatory Visit (INDEPENDENT_AMBULATORY_CARE_PROVIDER_SITE_OTHER): Payer: 59 | Admitting: Family Medicine

## 2016-06-22 ENCOUNTER — Ambulatory Visit: Payer: 59 | Admitting: Family Medicine

## 2016-06-22 VITALS — BP 132/84 | HR 66 | Temp 98.0°F | Resp 16 | Ht 67.0 in | Wt 340.9 lb

## 2016-06-22 DIAGNOSIS — I10 Essential (primary) hypertension: Secondary | ICD-10-CM

## 2016-06-22 MED ORDER — LISINOPRIL 10 MG PO TABS: 10.0000 mg | ORAL_TABLET | Freq: Every day | ORAL | 0 refills | Status: DC

## 2016-06-22 NOTE — Progress Notes (Signed)
Name: Madeline Dominguez   MRN: [id number]    DOB: [birth year]   Date:06/22/2016       Progress Note  Subjective  Chief Complaint  Chief Complaint  Patient presents with  . Follow-up    2 days  . Hypertension    Patient states she is unable to tolerate HCTZ due to dizziness, lightheadness, sick to her stomach and numbness in chest area.  Patient tried taking it for 2 weeks but symptoms never got any better, stopped taking it Wednesday night.    HPI  HTN: bp was out of control, went to [facility] multiple times, since last visit stopped HCTZ because it causes cramps, dizziness and nausea, bp has been well controlled without any medications for the past 2 days, but she is not working. We will try adding ace, and monitor bp, she will be off work until Monday.    Patient Active Problem List   Diagnosis Date Noted  . 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 Right leg DVT ([place]) 08/01/2015  . Iron deficiency anemia 07/27/2015  . B12 deficiency 07/27/2015  . Bilateral pulmonary embolism ([place]) 07/25/2015  . Adrenal nodule ([place]) 07/25/2015  . Pulmonary nodules/lesions, multiple 07/25/2015  . OSA (obstructive sleep apnea) 05/18/2015  . Thyroid nodule 12/17/2014  . Heavy menstrual period 09/02/2014  . Gastroesophageal reflux disease without esophagitis 09/02/2014  . Extreme obesity 06/22/2014  . Large breasts 06/22/2014  . Anemia, iron deficiency 06/22/2014  . H/O hyperthyroidism 06/22/2014  . Right ventricular dilation 06/22/2014  . 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    Past Surgical History:  Procedure Laterality Date  . CESAREAN SECTION      Family History  Problem Relation Age of Onset  . CVA Father     Social History   Social History  . Marital status: Married    Spouse name: N/A  . Number of children: N/A  . Years of education: N/A   Occupational History  . Not on file.   Social History Main Topics  .  Smoking status: Never Smoker  . Smokeless tobacco: Never Used  . Alcohol use No     Comment: rarely  . Drug use: No  . Sexual activity: Yes    Partners: Male    Birth control/ protection: Condom   Other Topics Concern  . Not on file   Social History Narrative   Lives at home with husband.     Current Outpatient Prescriptions:  .  cyclobenzaprine (FLEXERIL) 10 MG tablet, Take 10 mg by mouth as needed. , Disp: , Rfl:  .  ferrous sulfate 325 (65 FE) MG tablet, Take 1 tablet (325 mg total) by mouth 2 (two) times daily with a meal. (Patient taking differently: Take 325 mg by mouth daily. ), Disp: 180 tablet, Rfl: 1 .  vitamin B-12 1000 MCG tablet, Take 1 tablet (1,000 mcg total) by mouth daily., Disp: 30 tablet, Rfl: 0 .  lisinopril (PRINIVIL,ZESTRIL) 10 MG tablet, Take 1 tablet (10 mg total) by mouth daily., Disp: 30 tablet, Rfl: 0  Allergies  Allergen Reactions  . Aspirin Nausea And Vomiting  . Hctz [Hydrochlorothiazide] Other (See Comments)    Nausea, cramping     ROS  Ten systems reviewed and is negative except as mentioned in HPI   Objective  Vitals:   06/22/16 0857  BP: 132/84  Pulse: 66  Resp: 16  Temp: 98 F (36.7 C)  TempSrc: Oral  SpO2: 95%  Weight: (!) 340 lb 14.4 oz (154.6 kg)  Height: 5' 7"  (1.702 m)    Body mass index is 53.39 kg/m.  Physical Exam  Constitutional: Patient appears well-developed and well-nourished. Obese  No distress.  HEENT: head atraumatic, normocephalic, pupils equal and reactive to light, neck supple, throat within normal limits Cardiovascular: Normal rate, regular rhythm and normal heart sounds.  No murmur heard. Trace  BLE edema. Pulmonary/Chest: Effort normal and breath sounds normal. No respiratory distress. Abdominal: Soft.  There is no tenderness. Psychiatric: Patient has a normal mood and affect. behavior is normal. Judgment and thought content normal.  Recent Results (from the past 2160 hour(s))  COMPLETE METABOLIC  PANEL WITH GFR     Status: Abnormal   Collection Time: 04/27/16 11:29 AM  Result Value Ref Range   Sodium 141 135 - 146 mmol/L   Potassium 4.2 3.5 - 5.3 mmol/L   Chloride 105 98 - 110 mmol/L   CO2 31 20 - 31 mmol/L   Glucose, Bld 104 (H) 65 - 99 mg/dL   BUN 9 7 - 25 mg/dL   Creat 0.68 0.50 - 1.10 mg/dL   Total Bilirubin 0.2 0.2 - 1.2 mg/dL   Alkaline Phosphatase 64 33 - 115 U/L   AST 11 10 - 35 U/L   ALT 10 6 - 29 U/L   Total Protein 6.6 6.1 - 8.1 g/dL   Albumin 3.5 (L) 3.6 - 5.1 g/dL   Calcium 8.6 8.6 - 10.2 mg/dL   GFR, Est African American >89 >=60 mL/min   GFR, Est Non African American >89 >=60 mL/min  Lipid panel     Status: None   Collection Time: 04/27/16 11:29 AM  Result Value Ref Range   Cholesterol 178 <200 mg/dL   Triglycerides 53 <150 mg/dL   HDL 68 >50 mg/dL   Total CHOL/HDL Ratio 2.6 <5.0 Ratio   VLDL 11 <30 mg/dL   LDL Cholesterol 99 <100 mg/dL  Hemoglobin A1c     Status: None   Collection Time: 04/27/16 11:29 AM  Result Value Ref Range   Hgb A1c MFr Bld 4.9 <5.7 %    Comment:   For the purpose of screening for the presence of diabetes:   <5.7%       Consistent with the absence of diabetes 5.7-6.4 %   Consistent with increased risk for diabetes (prediabetes) >=6.5 %     Consistent with diabetes   This assay result is consistent with a decreased risk of diabetes.   Currently, no consensus exists regarding use of hemoglobin A1c for diagnosis of diabetes in children.   According to American Diabetes Association (ADA) guidelines, hemoglobin A1c <7.0% represents optimal control in non-pregnant diabetic patients. Different metrics may apply to specific patient populations. Standards of Medical Care in Diabetes (ADA).      Mean Plasma Glucose 94 mg/dL  Insulin, fasting     Status: None   Collection Time: 04/27/16 11:29 AM  Result Value Ref Range   Insulin fasting, serum 10.3 2.0 - 19.6 uIU/mL    Comment:   This insulin assay shows strong  cross-reactivity for some insulin analogs (lispro, aspart, and glargine) and much lower cross-reactivity with others (detemir, glulisine).   Stimulated Insulin reference intervals were established using the Siemens Immulite assay. These values are provided for general guidance only.   CBC with Differential     Status: Abnormal   Collection Time: 05/03/16 10:40 AM  Result Value Ref Range   WBC 7.0 3.6 - 11.0  K/uL   RBC 3.19 (L) 3.80 - 5.20 MIL/uL   Hemoglobin 8.3 (L) 12.0 - 16.0 g/dL   HCT 25.9 (L) 35.0 - 47.0 %   MCV 81.3 80.0 - 100.0 fL   MCH 25.9 (L) 26.0 - 34.0 pg   MCHC 31.9 (L) 32.0 - 36.0 g/dL   RDW 18.3 (H) 11.5 - 14.5 %   Platelets 335 150 - 440 K/uL   Neutrophils Relative % 53 %   Neutro Abs 3.8 1.4 - 6.5 K/uL   Lymphocytes Relative 35 %   Lymphs Abs 2.4 1.0 - 3.6 K/uL   Monocytes Relative 9 %   Monocytes Absolute 0.6 0.2 - 0.9 K/uL   Eosinophils Relative 3 %   Eosinophils Absolute 0.2 0 - 0.7 K/uL   Basophils Relative 0 %   Basophils Absolute 0.0 0 - 0.1 K/uL  Ferritin     Status: Abnormal   Collection Time: 05/03/16 10:40 AM  Result Value Ref Range   Ferritin 7 (L) 11 - 307 ng/mL  Iron and TIBC     Status: Abnormal   Collection Time: 05/03/16 10:40 AM  Result Value Ref Range   Iron 12 (L) 28 - 170 ug/dL   TIBC 312 250 - 450 ug/dL   Saturation Ratios 4 (L) 10.4 - 31.8 %   UIBC 300 ug/dL  CBC with Differential/Platelet     Status: Abnormal   Collection Time: 05/18/16  4:49 PM  Result Value Ref Range   WBC 6.9 3.6 - 11.0 K/uL   RBC 3.91 3.80 - 5.20 MIL/uL   Hemoglobin 10.9 (L) 12.0 - 16.0 g/dL   HCT 33.6 (L) 35.0 - 47.0 %   MCV 86.1 80.0 - 100.0 fL   MCH 27.9 26.0 - 34.0 pg   MCHC 32.4 32.0 - 36.0 g/dL   RDW 22.5 (H) 11.5 - 14.5 %   Platelets 256 150 - 440 K/uL   Neutrophils Relative % 64 %   Neutro Abs 4.5 1.4 - 6.5 K/uL   Lymphocytes Relative 25 %   Lymphs Abs 1.7 1.0 - 3.6 K/uL   Monocytes Relative 7 %   Monocytes Absolute 0.5 0.2 - 0.9 K/uL    Eosinophils Relative 3 %   Eosinophils Absolute 0.2 0 - 0.7 K/uL   Basophils Relative 1 %   Basophils Absolute 0.0 0 - 0.1 K/uL  Comprehensive metabolic panel     Status: Abnormal   Collection Time: 05/18/16  4:49 PM  Result Value Ref Range   Sodium 142 135 - 145 mmol/L   Potassium 3.8 3.5 - 5.1 mmol/L   Chloride 107 101 - 111 mmol/L   CO2 30 22 - 32 mmol/L   Glucose, Bld 99 65 - 99 mg/dL   BUN 13 6 - 20 mg/dL   Creatinine, Ser 0.73 0.44 - 1.00 mg/dL   Calcium 9.2 8.9 - 10.3 mg/dL   Total Protein 7.8 6.5 - 8.1 g/dL   Albumin 3.6 3.5 - 5.0 g/dL   AST 12 (L) 15 - 41 U/L   ALT 10 (L) 14 - 54 U/L   Alkaline Phosphatase 77 38 - 126 U/L   Total Bilirubin 0.3 0.3 - 1.2 mg/dL   GFR calc non Af Amer >60 >60 mL/min   GFR calc Af Amer >60 >60 mL/min    Comment: (NOTE) The eGFR has been calculated using the CKD EPI equation. This calculation has not been validated in all clinical situations. eGFR's persistently <60 mL/min signify possible Chronic Kidney Disease.  Anion gap 5 5 - 15  Troponin I     Status: None   Collection Time: 05/18/16  4:49 PM  Result Value Ref Range   Troponin I <0.03 <0.03 ng/mL  Brain natriuretic peptide     Status: None   Collection Time: 05/18/16  4:49 PM  Result Value Ref Range   B Natriuretic Peptide 64.0 0.0 - 100.0 pg/mL  COMPLETE METABOLIC PANEL WITH GFR     Status: Abnormal   Collection Time: 06/13/16 12:31 PM  Result Value Ref Range   Sodium 140 135 - 146 mmol/L   Potassium 3.9 3.5 - 5.3 mmol/L   Chloride 101 98 - 110 mmol/L   CO2 32 (H) 20 - 31 mmol/L   Glucose, Bld 106 (H) 65 - 99 mg/dL   BUN 9 7 - 25 mg/dL   Creat 0.69 0.50 - 1.10 mg/dL   Total Bilirubin 0.4 0.2 - 1.2 mg/dL   Alkaline Phosphatase 78 33 - 115 U/L   AST 8 (L) 10 - 35 U/L   ALT 8 6 - 29 U/L   Total Protein 6.8 6.1 - 8.1 g/dL   Albumin 3.6 3.6 - 5.1 g/dL   Calcium 9.5 8.6 - 10.2 mg/dL   GFR, Est African American >89 >=60 mL/min   GFR, Est Non African American >89 >=60  mL/min  TSH     Status: None   Collection Time: 06/13/16 12:31 PM  Result Value Ref Range   TSH 3.12 mIU/L    Comment:   Reference Range   > or = 20 Years  0.40-4.50   Pregnancy Range First trimester  0.26-2.66 Second trimester 0.55-2.73 Third trimester  0.43-2.91     Basic metabolic panel     Status: Abnormal   Collection Time: 06/13/16  1:02 PM  Result Value Ref Range   Sodium 137 135 - 145 mmol/L   Potassium 3.4 (L) 3.5 - 5.1 mmol/L   Chloride 100 (L) 101 - 111 mmol/L   CO2 30 22 - 32 mmol/L   Glucose, Bld 110 (H) 65 - 99 mg/dL   BUN 9 6 - 20 mg/dL   Creatinine, Ser 0.54 0.44 - 1.00 mg/dL   Calcium 9.5 8.9 - 10.3 mg/dL   GFR calc non Af Amer >60 >60 mL/min   GFR calc Af Amer >60 >60 mL/min    Comment: (NOTE) The eGFR has been calculated using the CKD EPI equation. This calculation has not been validated in all clinical situations. eGFR's persistently <60 mL/min signify possible Chronic Kidney Disease.    Anion gap 7 5 - 15  CBC     Status: Abnormal   Collection Time: 06/13/16  1:02 PM  Result Value Ref Range   WBC 5.3 3.6 - 11.0 K/uL   RBC 4.22 3.80 - 5.20 MIL/uL   Hemoglobin 12.0 12.0 - 16.0 g/dL   HCT 36.3 35.0 - 47.0 %   MCV 86.0 80.0 - 100.0 fL   MCH 28.4 26.0 - 34.0 pg   MCHC 33.0 32.0 - 36.0 g/dL   RDW 19.8 (H) 11.5 - 14.5 %   Platelets 253 150 - 440 K/uL  Troponin I     Status: None   Collection Time: 06/13/16  1:02 PM  Result Value Ref Range   Troponin I <0.03 <0.03 ng/mL  TSH     Status: None   Collection Time: 06/13/16  1:02 PM  Result Value Ref Range   TSH 3.895 0.350 - 4.500 uIU/mL  Comment: Performed by a 3rd Generation assay with a functional sensitivity of <=0.01 uIU/mL.  T4, free     Status: None   Collection Time: 06/13/16  1:02 PM  Result Value Ref Range   Free T4 0.77 0.61 - 1.12 ng/dL    Comment: (NOTE) Biotin ingestion may interfere with free T4 tests. If the results are inconsistent with the TSH level, previous test results, or  the clinical presentation, then consider biotin interference. If needed, order repeat testing after stopping biotin.   hCG, quantitative, pregnancy     Status: None   Collection Time: 06/13/16  1:02 PM  Result Value Ref Range   hCG, Beta Chain, Quant, S 1 <5 mIU/mL    Comment:          GEST. AGE      CONC.  (mIU/mL)   <=1 WEEK        5 - 50     2 WEEKS       50 - 500     3 WEEKS       100 - 10,000     4 WEEKS     1,000 - 30,000     5 WEEKS     3,500 - 115,000   6-8 WEEKS     12,000 - 270,000    12 WEEKS     15,000 - 220,000        FEMALE AND NON-PREGNANT FEMALE:     LESS THAN 5 mIU/mL   Glucose, capillary     Status: Abnormal   Collection Time: 06/13/16  1:40 PM  Result Value Ref Range   Glucose-Capillary 106 (H) 65 - 99 mg/dL  Urinalysis, Complete w Microscopic     Status: Abnormal   Collection Time: 06/13/16  4:27 PM  Result Value Ref Range   Color, Urine YELLOW (A) YELLOW   APPearance HAZY (A) CLEAR   Specific Gravity, Urine 1.013 1.005 - 1.030   pH 6.0 5.0 - 8.0   Glucose, UA NEGATIVE NEGATIVE mg/dL   Hgb urine dipstick MODERATE (A) NEGATIVE   Bilirubin Urine NEGATIVE NEGATIVE   Ketones, ur NEGATIVE NEGATIVE mg/dL   Protein, ur NEGATIVE NEGATIVE mg/dL   Nitrite POSITIVE (A) NEGATIVE   Leukocytes, UA TRACE (A) NEGATIVE   RBC / HPF 0-5 0 - 5 RBC/hpf   WBC, UA 0-5 0 - 5 WBC/hpf   Bacteria, UA MANY (A) NONE SEEN   Squamous Epithelial / LPF 0-5 (A) NONE SEEN   Mucous PRESENT   Troponin I     Status: None   Collection Time: 06/13/16  4:27 PM  Result Value Ref Range   Troponin I <0.03 <0.03 ng/mL  Basic metabolic panel     Status: Abnormal   Collection Time: 06/18/16  9:36 AM  Result Value Ref Range   Sodium 136 135 - 145 mmol/L   Potassium 3.4 (L) 3.5 - 5.1 mmol/L   Chloride 100 (L) 101 - 111 mmol/L   CO2 30 22 - 32 mmol/L   Glucose, Bld 114 (H) 65 - 99 mg/dL   BUN 13 6 - 20 mg/dL   Creatinine, Ser 0.86 0.44 - 1.00 mg/dL   Calcium 9.2 8.9 - 10.3 mg/dL   GFR calc  non Af Amer >60 >60 mL/min   GFR calc Af Amer >60 >60 mL/min    Comment: (NOTE) The eGFR has been calculated using the CKD EPI equation. This calculation has not been validated in all clinical situations. eGFR's persistently <60  mL/min signify possible Chronic Kidney Disease.    Anion gap 6 5 - 15  CBC     Status: Abnormal   Collection Time: 06/18/16  9:36 AM  Result Value Ref Range   WBC 5.5 3.6 - 11.0 K/uL   RBC 4.46 3.80 - 5.20 MIL/uL   Hemoglobin 12.5 12.0 - 16.0 g/dL   HCT 38.1 35.0 - 47.0 %   MCV 85.4 80.0 - 100.0 fL   MCH 28.0 26.0 - 34.0 pg   MCHC 32.8 32.0 - 36.0 g/dL   RDW 19.4 (H) 11.5 - 14.5 %   Platelets 247 150 - 440 K/uL  Troponin I     Status: None   Collection Time: 06/18/16  9:56 AM  Result Value Ref Range   Troponin I <0.03 <0.03 ng/mL  APTT     Status: None   Collection Time: 06/18/16  9:56 AM  Result Value Ref Range   aPTT 27 24 - 36 seconds  Urinalysis, Complete w Microscopic     Status: Abnormal   Collection Time: 06/18/16  2:20 PM  Result Value Ref Range   Color, Urine YELLOW (A) YELLOW   APPearance HAZY (A) CLEAR   Specific Gravity, Urine 1.012 1.005 - 1.030   pH 6.0 5.0 - 8.0   Glucose, UA NEGATIVE NEGATIVE mg/dL   Hgb urine dipstick MODERATE (A) NEGATIVE   Bilirubin Urine NEGATIVE NEGATIVE   Ketones, ur NEGATIVE NEGATIVE mg/dL   Protein, ur NEGATIVE NEGATIVE mg/dL   Nitrite NEGATIVE NEGATIVE   Leukocytes, UA SMALL (A) NEGATIVE   RBC / HPF 0-5 0 - 5 RBC/hpf   WBC, UA 6-30 0 - 5 WBC/hpf   Bacteria, UA RARE (A) NONE SEEN   Squamous Epithelial / LPF 6-30 (A) NONE SEEN   Mucous PRESENT   Urine culture     Status: Abnormal   Collection Time: 06/18/16  2:20 PM  Result Value Ref Range   Specimen Description URINE, RANDOM    Special Requests NONE    Culture MULTIPLE SPECIES PRESENT, SUGGEST RECOLLECTION (A)    Report Status 06/20/2016 FINAL   Fibrin derivatives D-Dimer ([facility] only)     Status: None   Collection Time: 06/20/16  1:42 PM  Result  Value Ref Range   Fibrin derivatives D-dimer ([facility]) 386.70 0.00 - 499.00    Comment: (NOTE) <> Exclusion of Venous Thromboembolism (VTE) - OUTPATIENT ONLY   (Emergency Department or [place])   0-499 ng/ml (FEU): With a low to intermediate pretest probability                      for VTE this test result excludes the diagnosis                      of VTE.   >499 ng/ml (FEU) : VTE not excluded; additional work up for VTE is                      required. <> Testing on Inpatients and Evaluation of Disseminated Intravascular   Coagulation ([facility]) Reference Range:   0-499 ng/ml (FEU)      PHQ2/9: Depression screen [facility] 2/9 06/22/2016 12/20/2015 09/02/2015 03/04/2015 12/03/2014  Decreased Interest 0 0 0 0 0  Down, Depressed, Hopeless 0 0 0 0 0  PHQ - 2 Score 0 0 0 0 0     Fall Risk: Fall Risk  06/22/2016 05/03/2016 12/20/2015 09/02/2015 03/04/2015  Falls in the past year?  No No No No No     Functional Status Survey: Is the patient deaf or have difficulty hearing?: No Does the patient have difficulty seeing, even when wearing glasses/contacts?: No Does the patient have difficulty concentrating, remembering, or making decisions?: No Does the patient have difficulty walking or climbing stairs?: No Does the patient have difficulty dressing or bathing?: No Does the patient have difficulty doing errands alone such as visiting a doctor's office or shopping?: No    Assessment & Plan  1. Benign hypertension  She has not been able to tolerate HCTZ, bystolic caused bradycardia, we will try low dose ace since bp was spiking last week, she can check bp at home twice daily and hold medication if bp remains below 140/80 - lisinopril (PRINIVIL,ZESTRIL) 10 MG tablet; Take 1 tablet (10 mg total) by mouth daily.  Dispense: 30 tablet; Refill: 0

## 2016-06-26 ENCOUNTER — Telehealth: Payer: Self-pay

## 2016-06-26 NOTE — Telephone Encounter (Signed)
Unlikely to be side effect of lisinopril. There is a lot of gastroenteritis going on, but needs follow up if not better in 2 days, or if symptoms are worse, associated with fever

## 2016-06-26 NOTE — Telephone Encounter (Signed)
Patient notified

## 2016-06-26 NOTE — Telephone Encounter (Signed)
Patient called states she was started on lisinopril last Thursday woke up this am with chills, body aches and stomach cramps.  Patient wants to know if this could be a side effect to this med? Please advise

## 2016-06-27 DIAGNOSIS — R0683 Snoring: Secondary | ICD-10-CM | POA: Diagnosis not present

## 2016-06-27 DIAGNOSIS — G4733 Obstructive sleep apnea (adult) (pediatric): Secondary | ICD-10-CM | POA: Diagnosis not present

## 2016-06-29 ENCOUNTER — Telehealth: Payer: Self-pay | Admitting: Family Medicine

## 2016-06-29 ENCOUNTER — Other Ambulatory Visit: Payer: Self-pay | Admitting: Family Medicine

## 2016-06-29 DIAGNOSIS — R42 Dizziness and giddiness: Secondary | ICD-10-CM

## 2016-06-29 NOTE — Telephone Encounter (Signed)
BP of 135 does not cause dizziness, she needs to see a nephrologist and we can refer her to Neurologist. I don't think dizziness related to bp

## 2016-06-29 NOTE — Telephone Encounter (Signed)
Patient notified and informed we are sending her referral to Nephrology again.

## 2016-06-29 NOTE — Telephone Encounter (Signed)
Patient state she has been on the Lisinopril for 2 weeks now and still having symptoms from medication. She is experiencing dizziness and lightheaded her BP has been running 135/82 and pulse-68. She has been so sick from this medication she missed all week from work. Please advise.

## 2016-06-30 ENCOUNTER — Observation Stay
Admission: EM | Admit: 2016-06-30 | Discharge: 2016-07-03 | Disposition: A | Discharge status: Home / Self Care | Payer: Commercial Managed Care - HMO | Attending: Internal Medicine | Admitting: Internal Medicine

## 2016-06-30 DIAGNOSIS — Z823 Family history of stroke: Secondary | ICD-10-CM | POA: Insufficient documentation

## 2016-06-30 DIAGNOSIS — Z6841 Body Mass Index (BMI) 40.0 and over, adult: Secondary | ICD-10-CM | POA: Insufficient documentation

## 2016-06-30 DIAGNOSIS — I420 Dilated cardiomyopathy: Secondary | ICD-10-CM | POA: Insufficient documentation

## 2016-06-30 DIAGNOSIS — R531 Weakness: Secondary | ICD-10-CM

## 2016-06-30 DIAGNOSIS — R0789 Other chest pain: Principal | ICD-10-CM | POA: Insufficient documentation

## 2016-06-30 DIAGNOSIS — G4733 Obstructive sleep apnea (adult) (pediatric): Secondary | ICD-10-CM | POA: Diagnosis not present

## 2016-06-30 DIAGNOSIS — R42 Dizziness and giddiness: Secondary | ICD-10-CM | POA: Insufficient documentation

## 2016-06-30 DIAGNOSIS — E039 Hypothyroidism, unspecified: Secondary | ICD-10-CM | POA: Diagnosis not present

## 2016-06-30 DIAGNOSIS — R200 Anesthesia of skin: Secondary | ICD-10-CM

## 2016-06-30 DIAGNOSIS — R079 Chest pain, unspecified: Secondary | ICD-10-CM | POA: Diagnosis present

## 2016-06-30 DIAGNOSIS — I10 Essential (primary) hypertension: Secondary | ICD-10-CM | POA: Diagnosis present

## 2016-06-30 DIAGNOSIS — Z79899 Other long term (current) drug therapy: Secondary | ICD-10-CM | POA: Insufficient documentation

## 2016-06-30 LAB — CBC
HEMATOCRIT: 36.3 % (ref 35.0–47.0)
Hemoglobin: 12.1 g/dL (ref 12.0–16.0)
MCH: 29.1 pg (ref 26.0–34.0)
MCHC: 33.4 g/dL (ref 32.0–36.0)
MCV: 87.4 fL (ref 80.0–100.0)
Platelets: 227 10*3/uL (ref 150–440)
RBC: 4.15 MIL/uL (ref 3.80–5.20)
RDW: 18.6 % — AB (ref 11.5–14.5)
WBC: 6 10*3/uL (ref 3.6–11.0)

## 2016-06-30 LAB — BASIC METABOLIC PANEL
Anion gap: 9 (ref 5–15)
BUN: 10 mg/dL (ref 6–20)
CALCIUM: 9.2 mg/dL (ref 8.9–10.3)
CO2: 26 mmol/L (ref 22–32)
Chloride: 104 mmol/L (ref 101–111)
Creatinine, Ser: 0.78 mg/dL (ref 0.44–1.00)
GFR calc Af Amer: >60 mL/min (ref >60)
GLUCOSE: 99 mg/dL (ref 65–99)
Potassium: 3.6 mmol/L (ref 3.5–5.1)
Sodium: 139 mmol/L (ref 135–145)

## 2016-06-30 LAB — TROPONIN I

## 2016-06-30 MED ORDER — NITROGLYCERIN 2 % TD OINT
0.5000 [in_us] | TOPICAL_OINTMENT | Freq: Once | TRANSDERMAL | Status: AC
Start: 2016-07-01 — End: 2016-07-01
  Administered 2016-07-01: 0.5 [in_us] via TOPICAL
  Filled 2016-06-30: qty 1

## 2016-06-30 MED ORDER — ASPIRIN 81 MG PO CHEW
324.0000 mg | CHEWABLE_TABLET | Freq: Once | ORAL | Status: AC
  Administered 2016-07-01: 324 mg via ORAL
  Filled 2016-06-30: qty 4

## 2016-06-30 MED ORDER — ONDANSETRON HCL 4 MG/2ML IJ SOLN
4.0000 mg | Freq: Once | INTRAMUSCULAR | Status: AC
  Administered 2016-07-01: 4 mg via INTRAVENOUS
  Filled 2016-06-30: qty 2

## 2016-06-30 NOTE — ED Triage Notes (Signed)
Pt states has hypertension. Pt states she woke up this am with high blood pressure and intermittent tingling to right 2nd and third fingers. Pt denies pain. Difficult to obtain history from pt due to pt's unwillingness to answer questions in triage. Skin normal color warm and dry, resps unlabored.

## 2016-06-30 NOTE — ED Provider Notes (Signed)
[facility] Emergency Department Provider Note   ____________________________________________   First MD Initiated Contact with Patient 06/30/16 2339     (approximate)  I have reviewed the triage vital signs and the nursing notes.   HISTORY  Chief Complaint Hypertension    HPI Madeline Dominguez is a 51 y.o. female who presents to the ED from home with multiple medical complaints. Patient has a history of hypertension. Reports she was on Coreg for a number of years until last month when her PCP took her off secondary to swelling. She was placed on lisinopril and felt okay for several days until now when she began to feel lightheaded, dizzy, numbness and tingling to her right second and third fingers. Today she woke up with high blood pressure at home (baseline 130s/70s) and chest pressure tonight radiating to her left shoulder and associated with shortness of breath. Denies associated diaphoresis, palpitations. States sometimes she wakes up in the morning coughing and vomiting. Family denies slurred speech but states patient seems cognitively slower than usual. Patient recently had UTI status post 2 courses of antibiotics and still feeling symptoms. Denies recent travel or trauma. Nothing makes her symptoms better or worse.   Past Medical History:  Diagnosis Date  . Cough 07/25/2015  . Dilated cardiomyopathy ([place])   . H/O: hypothyroidism   . Heart palpitations   . Hypertension   . Iron deficiency   . Large breasts   . Morbid obesity ([place])   . Postpartum hemorrhage   . Thyroid disease     Patient Active Problem List   Diagnosis Date Noted  . 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 Right leg DVT ([place]) 08/01/2015  . Iron deficiency anemia 07/27/2015  . B12 deficiency 07/27/2015  . Bilateral pulmonary embolism ([place]) 07/25/2015  . Adrenal nodule ([place]) 07/25/2015  . Pulmonary nodules/lesions, multiple 07/25/2015  . OSA (obstructive sleep  apnea) 05/18/2015  . Thyroid nodule 12/17/2014  . Heavy menstrual period 09/02/2014  . Gastroesophageal reflux disease without esophagitis 09/02/2014  . Extreme obesity 06/22/2014  . Large breasts 06/22/2014  . Anemia, iron deficiency 06/22/2014  . H/O hyperthyroidism 06/22/2014  . Right ventricular dilation 06/22/2014  . 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    Past Surgical History:  Procedure Laterality Date  . CESAREAN SECTION      Prior to Admission medications   Medication Sig Start Date End Date Taking? Authorizing Provider  cyclobenzaprine (FLEXERIL) 10 MG tablet Take 10 mg by mouth as needed.  06/19/16   [provider]  ferrous sulfate 325 (65 FE) MG tablet Take 1 tablet (325 mg total) by mouth 2 (two) times daily with a meal. Patient taking differently: Take 325 mg by mouth daily.  09/02/14   Steele Sizer, MD  lisinopril (PRINIVIL,ZESTRIL) 10 MG tablet Take 1 tablet (10 mg total) by mouth daily. 06/22/16   Steele Sizer, MD  vitamin B-12 1000 MCG tablet Take 1 tablet (1,000 mcg total) by mouth daily. 07/27/15   Loletha Grayer, MD    Allergies Aspirin and Hctz [hydrochlorothiazide]  Family History  Problem Relation Age of Onset  . CVA Father     Social History Social History  Substance Use Topics  . Smoking status: Never Smoker  . Smokeless tobacco: Never Used  . Alcohol use No     Comment: rarely    Review of Systems  Constitutional: No fever/chills. Eyes: No visual changes. ENT: No sore throat. Cardiovascular:  positive for chest pain. Respiratory: ositive for shortness of breath. Gastrointestinal: No abdominal pain.  No nausea, no vomiting.  No diarrhea.  No constipation. Genitourinary: Negative for dysuria. Musculoskeletal: Negative for back pain. Skin: Negative for rash. Neurological: positive for lightheadedness and dizziness. Negative for headaches, focal weakness or  numbness.   ____________________________________________   PHYSICAL EXAM:  VITAL SIGNS: ED Triage Vitals  Enc Vitals Group     BP 06/30/16 1929 (!) 155/95     Pulse Rate 06/30/16 1929 (!) 54     Resp 06/30/16 1929 16     Temp 06/30/16 1929 98.5 F (36.9 C)     Temp Source 06/30/16 1929 Oral     SpO2 06/30/16 1929 95 %     Weight 06/30/16 1930 (!) 340 lb (154.2 kg)     Height 06/30/16 1930 5\' 7"  (1.702 m)     Head Circumference --      Peak Flow --      Pain Score 06/30/16 1929 0     Pain Loc --      Pain Edu? --      Excl. in [place]? --     Constitutional: Alert and oriented. Tired appearing and in mild acute distress. Eyes: Conjunctivae are normal. PERRL. EOMI. Head: Atraumatic. Nose: No congestion/rhinnorhea. Mouth/Throat: Mucous membranes are moist.  Oropharynx non-erythematous. Neck: No stridor.  No cervical spine tenderness to palpation. No carotid bruits. Cardiovascular: Normal rate, regular rhythm. Grossly normal heart sounds.  Good peripheral circulation. Respiratory: Normal respiratory effort.  No retractions. Lungs CTAB. Gastrointestinal: Soft and nontender. No distention. No abdominal bruits. No CVA tenderness. Musculoskeletal: No lower extremity tenderness nor edema.  No joint effusions. Neurologic:  Normal speech and language. 4/5 RLE motor strength. Skin:  Skin is warm, dry and intact. No rash noted. Psychiatric: Mood and affect are normal. Speech and behavior are normal.  ____________________________________________   LABS (all labs ordered are listed, but only abnormal results are displayed)  Labs Reviewed  CBC - Abnormal; Notable for the following:       Result Value   RDW 18.6 (*)    All other components within normal limits  URINALYSIS, COMPLETE (UACMP) WITH MICROSCOPIC - Abnormal; Notable for the following:    Color, Urine YELLOW (*)    APPearance HAZY (*)    Hgb urine dipstick MODERATE (*)    Ketones, ur 20 (*)    Leukocytes, UA TRACE (*)     Bacteria, UA RARE (*)    Squamous Epithelial / LPF 6-30 (*)    All other components within normal limits  BASIC METABOLIC PANEL  TROPONIN I   ____________________________________________  EKG  ED ECG REPORT I, Aleia Larocca J, the attending physician, personally viewed and interpreted this ECG.   Date: 06/30/2016  EKG Time: 1948  Rate: 54  Rhythm: sinus bradycardia  Axis: normal  Intervals:none  ST&T Change: nonspecific  ____________________________________________  RADIOLOGY  Dg Chest 1 View  Result Date: 07/01/2016 CLINICAL DATA:  Paresthesias to the right upper extremity. Hypertension. EXAM: CHEST 1 VIEW COMPARISON:  05/18/2016 FINDINGS: A single AP portable view of the chest demonstrates no focal airspace consolidation or alveolar edema. The lungs are grossly clear. There is no large effusion or pneumothorax. There is unchanged cardiomegaly. Cardiac and mediastinal contours are otherwise unremarkable. IMPRESSION: Unchanged cardiomegaly.  No acute findings. Electronically Signed   By: Andreas Newport M.D.   On: 07/01/2016 00:30   Ct Head Wo Contrast  Result Date: 07/01/2016 CLINICAL DATA:  Hypertension  and right upper extremity paresthesias tonight. EXAM: CT HEAD WITHOUT CONTRAST TECHNIQUE: Contiguous axial images were obtained from the base of the skull through the vertex without intravenous contrast. COMPARISON:  06/13/2016 FINDINGS: Brain: There is no intracranial hemorrhage, mass or evidence of acute infarction. There is no extra-axial fluid collection. Gray matter and white matter appear normal. Cerebral volume is normal for age. Brainstem and posterior fossa are unremarkable. The CSF spaces appear normal. Vascular: No hyperdense vessel or unexpected calcification. Skull: Normal. Negative for fracture or focal lesion. Sinuses/Orbits: No acute finding. Other: None. IMPRESSION: Normal brain Electronically Signed   By: Andreas Newport M.D.   On: 07/01/2016 00:46     ____________________________________________   PROCEDURES  Procedure(s) performed: None  Procedures  Critical Care performed: No  ____________________________________________   INITIAL IMPRESSION / ASSESSMENT AND PLAN / ED COURSE  Pertinent labs & imaging results that were available during my care of the patient were reviewed by me and considered in my medical decision making (see chart for details).  51 year old female with dilated cardiomyopathy, hypertension who presents for generalized weakness, chest pain, shortness of breath, numbness/tingling in her fingertips. NIH stroke scale equals 1. Patient is not a candidate for TPA given low stroke scale as well as unknown onset of symptoms.Will obtain head CT, repeat urinalysis, orthostatics; give judicious fluid Administer aspirin with Zofran, nitroglycerin paste. Anticipate hospitalization.      ____________________________________________   FINAL CLINICAL IMPRESSION(S) / ED DIAGNOSES  Final diagnoses:  Essential hypertension  Weakness generalized  Numbness      NEW MEDICATIONS STARTED DURING THIS VISIT:  New Prescriptions   No medications on file     Note:  This document was prepared using Dragon voice recognition software and may include unintentional dictation errors.    Paulette Blanch, MD 07/01/16 [phone]

## 2016-07-01 ENCOUNTER — Emergency Department: Payer: Commercial Managed Care - HMO

## 2016-07-01 DIAGNOSIS — R0789 Other chest pain: Secondary | ICD-10-CM | POA: Diagnosis not present

## 2016-07-01 DIAGNOSIS — R202 Paresthesia of skin: Secondary | ICD-10-CM | POA: Diagnosis not present

## 2016-07-01 DIAGNOSIS — E039 Hypothyroidism, unspecified: Secondary | ICD-10-CM | POA: Diagnosis not present

## 2016-07-01 DIAGNOSIS — I110 Hypertensive heart disease with heart failure: Secondary | ICD-10-CM

## 2016-07-01 DIAGNOSIS — R079 Chest pain, unspecified: Secondary | ICD-10-CM | POA: Diagnosis not present

## 2016-07-01 DIAGNOSIS — R0609 Other forms of dyspnea: Secondary | ICD-10-CM

## 2016-07-01 DIAGNOSIS — R42 Dizziness and giddiness: Secondary | ICD-10-CM | POA: Diagnosis not present

## 2016-07-01 DIAGNOSIS — I517 Cardiomegaly: Secondary | ICD-10-CM | POA: Diagnosis not present

## 2016-07-01 LAB — URINALYSIS, COMPLETE (UACMP) WITH MICROSCOPIC
Bilirubin Urine: NEGATIVE
GLUCOSE, UA: NEGATIVE mg/dL
KETONES UR: 20 mg/dL — AB
Nitrite: NEGATIVE
PROTEIN: NEGATIVE mg/dL
Specific Gravity, Urine: 1.024 (ref 1.005–1.030)
pH: 5 (ref 5.0–8.0)

## 2016-07-01 LAB — TROPONIN I
Troponin I: <0.03 ng/mL (ref <0.03)
Troponin I: <0.03 ng/mL (ref <0.03)
Troponin I: <0.03 ng/mL (ref <0.03)

## 2016-07-01 LAB — TSH: TSH: 6.699 u[IU]/mL — ABNORMAL HIGH (ref 0.350–4.500)

## 2016-07-01 MED ORDER — ONDANSETRON HCL 4 MG PO TABS: 4.0000 mg | ORAL_TABLET | Freq: Four times a day (QID) | ORAL | Status: DC | PRN

## 2016-07-01 MED ORDER — LEVOTHYROXINE SODIUM 50 MCG PO TABS
50.0000 ug | ORAL_TABLET | Freq: Every day | ORAL | Status: DC
  Administered 2016-07-01 – 2016-07-03 (×3): 50 ug via ORAL
  Filled 2016-07-01 (×3): qty 1

## 2016-07-01 MED ORDER — ASPIRIN EC 81 MG PO TBEC
81.0000 mg | DELAYED_RELEASE_TABLET | Freq: Every day | ORAL | Status: DC
  Administered 2016-07-01 – 2016-07-03 (×3): 81 mg via ORAL
  Filled 2016-07-01 (×3): qty 1

## 2016-07-01 MED ORDER — SODIUM CHLORIDE 0.9 % IV BOLUS (SEPSIS)
500.0000 mL | Freq: Once | INTRAVENOUS | Status: AC
Start: 2016-07-01 — End: 2016-07-01
  Administered 2016-07-01: 500 mL via INTRAVENOUS

## 2016-07-01 MED ORDER — ENOXAPARIN SODIUM 40 MG/0.4ML ~~LOC~~ SOLN
40.0000 mg | Freq: Two times a day (BID) | SUBCUTANEOUS | Status: DC
  Administered 2016-07-01 – 2016-07-02 (×4): 40 mg via SUBCUTANEOUS
  Filled 2016-07-01 (×5): qty 0.4

## 2016-07-01 MED ORDER — ACETAMINOPHEN 325 MG PO TABS
650.0000 mg | ORAL_TABLET | Freq: Four times a day (QID) | ORAL | Status: DC | PRN
  Administered 2016-07-01 – 2016-07-03 (×3): 650 mg via ORAL
  Filled 2016-07-01 (×3): qty 2

## 2016-07-01 MED ORDER — ONDANSETRON HCL 4 MG/2ML IJ SOLN: 4.0000 mg | Freq: Four times a day (QID) | INTRAMUSCULAR | Status: DC | PRN

## 2016-07-01 MED ORDER — ACETAMINOPHEN 650 MG RE SUPP: 650.0000 mg | Freq: Four times a day (QID) | RECTAL | Status: DC | PRN

## 2016-07-01 MED ORDER — DOCUSATE SODIUM 100 MG PO CAPS
100.0000 mg | ORAL_CAPSULE | Freq: Two times a day (BID) | ORAL | Status: DC
  Administered 2016-07-01 (×2): 100 mg via ORAL
  Filled 2016-07-01 (×4): qty 1

## 2016-07-01 MED ORDER — PANTOPRAZOLE SODIUM 40 MG PO TBEC
40.0000 mg | DELAYED_RELEASE_TABLET | Freq: Every day | ORAL | Status: DC
  Administered 2016-07-01 – 2016-07-03 (×3): 40 mg via ORAL
  Filled 2016-07-01 (×3): qty 1

## 2016-07-01 NOTE — Progress Notes (Signed)
Patient admitted with the diagnosis of chest pain. Alert and oriented x 4. Denied any acute pain at this moment. Tele box verified with Daniell NT. Skin assessment done with Gelene Mink. RN, no skin issues to report. Patient agreed to set up a password. Safety risk reviewed and patient signed the contract. Family at bedside voiced no concerns. Will continue to monitor.

## 2016-07-01 NOTE — Progress Notes (Signed)
Initial Nutrition Assessment  DOCUMENTATION CODES:   Morbid obesity  INTERVENTION:  Discussed importance of obtaining adequate protein at meals to prevent further unintentional weight loss and loss of lean body mass. Discussed foods that contain protein in them.    Patient would benefit from education on Heart Healthy diet. She deferred education today, but is amenable to receiving education on another day.   NUTRITION DIAGNOSIS:   Inadequate oral intake related to poor appetite, nausea as evidenced by per patient/family report.  GOAL:   Patient will meet greater than or equal to 90% of their needs  MONITOR:   PO intake, Labs, Weight trends, I & O's  REASON FOR ASSESSMENT:   Malnutrition Screening Tool    ASSESSMENT:   51 year old female with PMHx of HTN, dilated cardiomyopathy, hypothyroidism who presented with chest pain and dizziness undergoing work-up.    Spoke with patient at bedside. She reports her appetite has been decreased for a few weeks. She reports she has not felt like eating because she has been dizzy, lightheaded, and has had an upset stomach. Patient reports she just had a bowel movement today. She has been attempting to eat 1-2 meals per day these past few weeks but only finishing about 50%. For her lunch tray today she had the bag of chips and then 50% of her sandwich. She reports typically eating 2 meals per day. Does not eat snacks. Patient reports she has never been on a heart healthy diet before. She is interested in learning about heart healthy eating, but just wants education another day. Patient is lactose-intolerant.  Patient reports UBW is 350-359 lbs. She has lost approximately 14.8 lbs (4.1% body weight) over the past month, which is not significant for time frame.  Meal Completion: 50%  Medications reviewed and include: ASA 81 mg, Colace, levothyroxine, pantoprazole.  Labs reviewed and none pertinent.  Nutrition-Focused physical exam completed.  Findings are no fat depletion, no muscle depletion, and no edema.   Patient does not meet criteria for malnutrition at this time.  Diet Order:  Diet Heart Room service appropriate? Yes; Fluid consistency: Thin  Skin:  Reviewed, no issues  Last BM:  07/01/2016 per pt report  Height:   Ht Readings from Last 1 Encounters:  06/30/16 5\' 7"  (1.702 m)    Weight:   Wt Readings from Last 1 Encounters:  07/01/16 (!) 342 lb 8 oz (155.4 kg)    Ideal Body Weight:  61.4 kg  BMI:  Body mass index is 53.64 kg/m.  Estimated Nutritional Needs:   Kcal:  3300-7622 (MSJ x 1.1-1.2)  Protein:  125-155 grams (0.8-1 grams/kg)  Fluid:  1.8-2.1 L/day (30-35 ml/kg IBW)  EDUCATION NEEDS:   Education needs addressed  Willey Blade, MS, RD, LDN Pager: [phone] After Hours Pager: [phone]

## 2016-07-01 NOTE — Consult Note (Addendum)
ELECTROPHYSIOLOGY CONSULT NOTE  Patient ID: Madeline Dominguez, MRN: [id number], DOB/AGE: May 13, 1965 51 y.o. Admit date: 06/30/2016 Date of Consult: 07/01/2016  Primary Physician: Steele Sizer, MD Primary Cardiologist: none  Chief Complaint: chest pain   HPI Madeline Dominguez is a 51 y.o. female admitted with chest pain  Her story is quite difficult. She does have chest pain. It is exclusively occurring following eating or drinking. It is never associated with exertion.  She has a history of dyspnea on exertion and peripheral edema which has been more problematic over the last year or 2. It is accompanied by exertional fatigue. Efforts to address the edema prompted the use of a low-dose diuretic with some improvement.  However, in the wake of this, she noted increasing problems with dizziness. This dizziness is worse in the morning upon arising from sleep but can be present upon awakening and before arising. She notes the dizziness can occur in any position although it seems to be aggravated by motion particularly standing. It is only modestly relieved by sitting.  She has long-standing hypertension. She has obstructive sleep apnea for which she has been on CPAP and compliant until recently.   She is morbidly obese  (BMI 53)   Echo 7/17 EF 45% <<< 55-60% (2016)   Past Medical History:  Diagnosis Date  . Cough 07/25/2015  . Dilated cardiomyopathy ([place])   . H/O: hypothyroidism   . Heart palpitations   . Hypertension   . Iron deficiency   . Large breasts   . Morbid obesity ([place])   . Postpartum hemorrhage   . Thyroid disease       Surgical History:  Past Surgical History:  Procedure Laterality Date  . CESAREAN SECTION       Home Meds: Prior to Admission medications   Medication Sig Start Date End Date Taking? Authorizing Provider  cyclobenzaprine (FLEXERIL) 10 MG tablet Take 10 mg by mouth 3 (three) times daily as needed for muscle spasms.  06/19/16  Yes [provider]  ferrous sulfate 325 (65 FE) MG tablet Take 1 tablet (325 mg total) by mouth 2 (two) times daily with a meal. Patient taking differently: Take 325 mg by mouth daily.  09/02/14  Yes Sowles, Drue Stager, MD  lisinopril (PRINIVIL,ZESTRIL) 10 MG tablet Take 1 tablet (10 mg total) by mouth daily. 06/22/16  Yes Sowles, Drue Stager, MD  vitamin B-12 1000 MCG tablet Take 1 tablet (1,000 mcg total) by mouth daily. 07/27/15  Yes Loletha Grayer, MD    Inpatient Medications:  . aspirin EC  81 mg Oral Daily  . docusate sodium  100 mg Oral BID  . enoxaparin (LOVENOX) injection  40 mg Subcutaneous BID    Allergies:  Allergies  Allergen Reactions  . Aspirin Nausea And Vomiting  . Hctz [Hydrochlorothiazide] Other (See Comments)    Nausea, cramping    Social History   Social History  . Marital status: Married    Spouse name: N/A  . Number of children: N/A  . Years of education: N/A   Occupational History  . Not on file.   Social History Main Topics  . Smoking status: Never Smoker  . Smokeless tobacco: Never Used  . Alcohol use No     Comment: rarely  . Drug use: No  . Sexual activity: Yes    Partners: Male    Birth control/ protection: Condom   Other Topics Concern  . Not on file   Social History Narrative   Lives at  home with husband.     Family History  Problem Relation Age of Onset  . CVA Father      ROS:  Please see the history of present illness.     All other systems reviewed and negative.    Physical Exam: Blood pressure (!) 144/73, pulse (!) 55, temperature 98.4 F (36.9 C), temperature source Oral, resp. rate 18, height 5\' 7"  (1.702 m), weight (!) 342 lb 8 oz (155.4 kg), SpO2 95 %. General: Well developed, Morbidly obese .african [place] female in no acute distress. Head: Normocephalic, atraumatic, sclera non-icteric, no xanthomas, nares are without discharge. EENT: normal Lymph Nodes:  none Back: without scoliosis/kyphosis, no CVA tendersness Neck:  Negative for carotid bruits. JVD 7-8 Lungs: Clear bilaterally to auscultation without wheezes, rales, or rhonchi. Breathing is unlabored. Heart: RRR with S1 S2.  2/6 systolic  murmur , rubs, or gallops appreciated. Abdomen: Soft, non-tender, non-distended with normoactive bowel sounds. No hepatomegaly. No rebound/guarding. No obvious abdominal masses. Msk:  Strength and tone appear normal for age. Extremities: No clubbing or cyanosis. tr + edema.  Distal pedal pulses are 2+ and equal bilaterally. Skin: Warm and Dry Neuro: Alert and oriented X 3. CN III-XII intact Grossly normal sensory and motor function . Psych:  Responds to questions appropriately with a normal affect.      Labs: Cardiac Enzymes  Recent Labs  06/30/16 1948 07/01/16 0350 07/01/16 1008  TROPONINI <0.03 <0.03 <0.03   CBC Lab Results  Component Value Date   WBC 6.0 06/30/2016   HGB 12.1 06/30/2016   HCT 36.3 06/30/2016   MCV 87.4 06/30/2016   PLT 227 06/30/2016   PROTIME: No results for input(s): LABPROT, INR in the last 72 hours. Chemistry  Recent Labs Lab 06/30/16 1948  NA 139  K 3.6  CL 104  CO2 26  BUN 10  CREATININE 0.78  CALCIUM 9.2  GLUCOSE 99   Lipids Lab Results  Component Value Date   CHOL 178 04/27/2016   HDL 68 04/27/2016   LDLCALC 99 04/27/2016   TRIG 53 04/27/2016   BNP No results found for: PROBNP Thyroid Function Tests:  Recent Labs  07/01/16 0350  TSH 6.699*      Miscellaneous No results found for: DDIMER  Radiology/Studies:  Dg Chest 1 View  Result Date: 07/01/2016 CLINICAL DATA:  Paresthesias to the right upper extremity. Hypertension. EXAM: CHEST 1 VIEW COMPARISON:  05/18/2016 FINDINGS: A single AP portable view of the chest demonstrates no focal airspace consolidation or alveolar edema. The lungs are grossly clear. There is no large effusion or pneumothorax. There is unchanged cardiomegaly. Cardiac and mediastinal contours are otherwise unremarkable. IMPRESSION:  Unchanged cardiomegaly.  No acute findings. Electronically Signed   By: Andreas Newport M.D.   On: 07/01/2016 00:30   Ct Head Wo Contrast  Result Date: 07/01/2016 CLINICAL DATA:  Hypertension and right upper extremity paresthesias tonight. EXAM: CT HEAD WITHOUT CONTRAST TECHNIQUE: Contiguous axial images were obtained from the base of the skull through the vertex without intravenous contrast. COMPARISON:  06/13/2016 FINDINGS: Brain: There is no intracranial hemorrhage, mass or evidence of acute infarction. There is no extra-axial fluid collection. Gray matter and white matter appear normal. Cerebral volume is normal for age. Brainstem and posterior fossa are unremarkable. The CSF spaces appear normal. Vascular: No hyperdense vessel or unexpected calcification. Skull: Normal. Negative for fracture or focal lesion. Sinuses/Orbits: No acute finding. Other: None. IMPRESSION: Normal brain Electronically Signed   By: Valerie Roys.D.  On: 07/01/2016 00:46   Ct Head Wo Contrast  Result Date: 06/13/2016 CLINICAL DATA:  Headache dizziness drowsiness, photosensitivity EXAM: CT HEAD WITHOUT CONTRAST TECHNIQUE: Contiguous axial images were obtained from the base of the skull through the vertex without intravenous contrast. COMPARISON:  None. FINDINGS: Brain: No evidence of acute infarction, hemorrhage, hydrocephalus, extra-axial collection or mass lesion/mass effect. Vascular: Negative for hyperdense vessel Skull: Negative Sinuses/Orbits: Paranasal sinuses clear. Air-fluid level right mastoid tip. 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 Other: None IMPRESSION: Normal CT of the brain Small air-fluid level right mastoid tip Electronically Signed   By: Franchot Gallo M.D.   On: 06/13/2016 13:56    VQQ:[id number] reviewed   sinus 59 19/11/48 LVH w repol    Assessment and Plan:  Chest pain atypical mostly postprandial  Dyspnea on exertion  Dizziness-non-positional aggravated by changes in  position  Hypertensive heart disease  Morbid obesity  Sleep apnea with CPAP intermittently used  The patient's chest pain syndrome is exclusively associated with eating and drinking. I suspect hence his reflux. And would consider PPI therapy. Her body habitus make Myoview scanning very difficult for specificity. Her pretest probabilities are sufficiently low that at this juncture I would not pursue that unless her echocardiogram turns out to be abnormal.   non-positional nature of her dizziness is concerning for noncardiovascular dizziness. This is true not withstanding the fact that emerged out of changes in her antihypertensive therapies and while orthostasis might be part of the issue, and we will need to check that, I would also be concerned, particularly in the context of a history of palpitations, that she's had atrial fibrillation and possibly a thromboembolic events. Recommend considering an MRI scan  Dyspnea on exertion is undoubtedly multifactorial in this morbidly obese hypertensive woman. There is likely a component of HFpEF; in addition hypoventilation secondary to weight and poor conditioning. It is possible that there is an anginal equivalent, but given the negative troponins with a protracted symptoms for specificity of Myoview scanning if her echo is okay I would not further pursue this. In the event that her echo is abnormal, CT calcium score for angiography might be a more valuable and specific tool   Will check BNP although specificity an issue with obesity   She has history of a goiter with hyperthyroidism. She last saw endocrinology [facility] 2/17. TSH therapy was anticipated but never initiated.  TSH are confusing with 3.9 and 6.9 within two weeks of each otehr      Virl Axe

## 2016-07-01 NOTE — ED Notes (Signed)
Reports has been called at this time. This RN waiting on being able to transport pt or EDT transport.

## 2016-07-01 NOTE — Progress Notes (Signed)
Lovenox changed to 40 mg BID for BMI >40 and CrCl >30. 

## 2016-07-01 NOTE — H&P (Signed)
Madeline Dominguez is an 51 y.o. female.   Chief Complaint: Chest pain HPI: The patient with past medical history of dilated cardiomyopathy and hypertension presents to the emergency department complaining of chest pain. The patient states that she has had brief episodes of chest pain over the last 3 days but it became worse than usual last night. She is not a great historian and is not clear on what she was doing at the time the chest pain began. She denies nausea, vomiting or diaphoresis but admits to feeling short of breath. However the latter is a chronic problem with walking moderate distances. The patient states that she had at least 2 episodes of left upper chest pain tonight that radiated toward her left shoulder. Pain was pressure in character. It resolved prior to administration of aspirin. Upon arrival the patient's blood pressure was mildly elevated and so nitroglycerin paste was placed on her chest. Her family states the patient has been slow to respond to questions during her episodes of chest pain in the emergency department physician noted that the patient would not participate with full neurologic exam. However the patient did not endorse symptoms of weakness, slurred speech or paralysis during our interview. Due to her multiple complaints emergency department staff asked the hospitalist service for admission.  Past Medical History:  Diagnosis Date  . Cough 07/25/2015  . Dilated cardiomyopathy ([place])   . H/O: hypothyroidism   . Heart palpitations   . Hypertension   . Iron deficiency   . Large breasts   . Morbid obesity ([place])   . Postpartum hemorrhage   . Thyroid disease     Past Surgical History:  Procedure Laterality Date  . CESAREAN SECTION      Family History  Problem Relation Age of Onset  . CVA Father    Social History:  reports that she has never smoked. She has never used smokeless tobacco. She reports that she does not drink alcohol or use drugs.  Allergies:   Allergies  Allergen Reactions  . Aspirin Nausea And Vomiting  . Hctz [Hydrochlorothiazide] Other (See Comments)    Nausea, cramping    Medications Prior to Admission  Medication Sig Dispense Refill  . cyclobenzaprine (FLEXERIL) 10 MG tablet Take 10 mg by mouth 3 (three) times daily as needed for muscle spasms.     . ferrous sulfate 325 (65 FE) MG tablet Take 1 tablet (325 mg total) by mouth 2 (two) times daily with a meal. (Patient taking differently: Take 325 mg by mouth daily. ) 180 tablet 1  . lisinopril (PRINIVIL,ZESTRIL) 10 MG tablet Take 1 tablet (10 mg total) by mouth daily. 30 tablet 0  . vitamin B-12 1000 MCG tablet Take 1 tablet (1,000 mcg total) by mouth daily. 30 tablet 0    Results for orders placed or performed during the hospital encounter of 06/30/16 (from the past 48 hour(s))  Basic metabolic panel     Status: None   Collection Time: 06/30/16  7:48 PM  Result Value Ref Range   Sodium 139 135 - 145 mmol/L   Potassium 3.6 3.5 - 5.1 mmol/L   Chloride 104 101 - 111 mmol/L   CO2 26 22 - 32 mmol/L   Glucose, Bld 99 65 - 99 mg/dL   BUN 10 6 - 20 mg/dL   Creatinine, Ser 0.78 0.44 - 1.00 mg/dL   Calcium 9.2 8.9 - 10.3 mg/dL   GFR calc non Af Amer >60 >60 mL/min   GFR calc Af  Amer >60 >60 mL/min    Comment: (NOTE) The eGFR has been calculated using the CKD EPI equation. This calculation has not been validated in all clinical situations. eGFR's persistently <60 mL/min signify possible Chronic Kidney Disease.    Anion gap 9 5 - 15  CBC     Status: Abnormal   Collection Time: 06/30/16  7:48 PM  Result Value Ref Range   WBC 6.0 3.6 - 11.0 K/uL   RBC 4.15 3.80 - 5.20 MIL/uL   Hemoglobin 12.1 12.0 - 16.0 g/dL   HCT 36.3 35.0 - 47.0 %   MCV 87.4 80.0 - 100.0 fL   MCH 29.1 26.0 - 34.0 pg   MCHC 33.4 32.0 - 36.0 g/dL   RDW 18.6 (H) 11.5 - 14.5 %   Platelets 227 150 - 440 K/uL  Troponin I     Status: None   Collection Time: 06/30/16  7:48 PM  Result Value Ref Range    Troponin I <0.03 <0.03 ng/mL  Urinalysis, Complete w Microscopic     Status: Abnormal   Collection Time: 07/01/16 12:00 AM  Result Value Ref Range   Color, Urine YELLOW (A) YELLOW   APPearance HAZY (A) CLEAR   Specific Gravity, Urine 1.024 1.005 - 1.030   pH 5.0 5.0 - 8.0   Glucose, UA NEGATIVE NEGATIVE mg/dL   Hgb urine dipstick MODERATE (A) NEGATIVE   Bilirubin Urine NEGATIVE NEGATIVE   Ketones, ur 20 (A) NEGATIVE mg/dL   Protein, ur NEGATIVE NEGATIVE mg/dL   Nitrite NEGATIVE NEGATIVE   Leukocytes, UA TRACE (A) NEGATIVE   RBC / HPF 0-5 0 - 5 RBC/hpf   WBC, UA 6-30 0 - 5 WBC/hpf   Bacteria, UA RARE (A) NONE SEEN   Squamous Epithelial / LPF 6-30 (A) NONE SEEN   Mucous PRESENT   TSH     Status: Abnormal   Collection Time: 07/01/16  3:50 AM  Result Value Ref Range   TSH 6.699 (H) 0.350 - 4.500 uIU/mL    Comment: Performed by a 3rd Generation assay with a functional sensitivity of <=0.01 uIU/mL.  Troponin I     Status: None   Collection Time: 07/01/16  3:50 AM  Result Value Ref Range   Troponin I <0.03 <0.03 ng/mL   Dg Chest 1 View  Result Date: 07/01/2016 CLINICAL DATA:  Paresthesias to the right upper extremity. Hypertension. EXAM: CHEST 1 VIEW COMPARISON:  05/18/2016 FINDINGS: A single AP portable view of the chest demonstrates no focal airspace consolidation or alveolar edema. The lungs are grossly clear. There is no large effusion or pneumothorax. There is unchanged cardiomegaly. Cardiac and mediastinal contours are otherwise unremarkable. IMPRESSION: Unchanged cardiomegaly.  No acute findings. Electronically Signed   By: Andreas Newport M.D.   On: 07/01/2016 00:30   Ct Head Wo Contrast  Result Date: 07/01/2016 CLINICAL DATA:  Hypertension and right upper extremity paresthesias tonight. EXAM: CT HEAD WITHOUT CONTRAST TECHNIQUE: Contiguous axial images were obtained from the base of the skull through the vertex without intravenous contrast. COMPARISON:  06/13/2016 FINDINGS:  Brain: There is no intracranial hemorrhage, mass or evidence of acute infarction. There is no extra-axial fluid collection. Gray matter and white matter appear normal. Cerebral volume is normal for age. Brainstem and posterior fossa are unremarkable. The CSF spaces appear normal. Vascular: No hyperdense vessel or unexpected calcification. Skull: Normal. Negative for fracture or focal lesion. Sinuses/Orbits: No acute finding. Other: None. IMPRESSION: Normal brain Electronically Signed   By: Valerie Roys.D.  On: 07/01/2016 00:46    Review of Systems  Constitutional: Negative for chills and fever.  HENT: Negative for sore throat and tinnitus.   Eyes: Negative for blurred vision and redness.  Respiratory: Negative for cough and shortness of breath.   Cardiovascular: Positive for chest pain. Negative for palpitations, orthopnea and PND.  Gastrointestinal: Negative for abdominal pain, diarrhea, nausea and vomiting.  Genitourinary: Negative for dysuria, frequency and urgency.  Musculoskeletal: Negative for joint pain and myalgias.  Skin: Negative for rash.       No lesions  Neurological: Negative for speech change, focal weakness and weakness.  Endo/Heme/Allergies: Does not bruise/bleed easily.       No temperature intolerance  Psychiatric/Behavioral: Negative for depression and suicidal ideas.    Blood pressure (!) 153/78, pulse (!) 51, temperature 98.3 F (36.8 C), temperature source Oral, resp. rate 18, height 5' 7"  (1.702 m), weight (!) 155.4 kg (342 lb 8 oz), SpO2 97 %. Physical Exam  Vitals reviewed. Constitutional: She is oriented to person, place, and time. She appears well-developed and well-nourished. No distress.  HENT:  Head: Normocephalic and atraumatic.  Mouth/Throat: Oropharynx is clear and moist.  Eyes: Conjunctivae and EOM are normal. Pupils are equal, round, and reactive to light. No scleral icterus.  Neck: Normal range of motion. Neck supple. No JVD present. No  tracheal deviation present. No thyromegaly present.  Cardiovascular: Normal rate and regular rhythm.  Exam reveals distant heart sounds. Exam reveals no gallop and no friction rub.   No murmur heard. Respiratory: Effort normal and breath sounds normal.  GI: Soft. Bowel sounds are normal. She exhibits no distension. There is no tenderness.  Genitourinary:  Genitourinary Comments: Deferred  Musculoskeletal: Normal range of motion. She exhibits no edema.  Lymphadenopathy:    She has no cervical adenopathy.  Neurological: She is alert and oriented to person, place, and time. No cranial nerve deficit. She exhibits normal muscle tone.  Skin: Skin is warm and dry. No rash noted. No erythema.  Psychiatric: She has a normal mood and affect. Her behavior is normal. Judgment and thought content normal.     Assessment/Plan This is a 51 year old female admitted for chest pain. 1. Chest pain: Troponin negative so far. No indication of myocardial ischemia on EKG. Continue to monitor telemetry. Cardiology consulted. 2. Essential hypertension: Currently controlled; continue lisinopril. 3. Cardiomyopathy: EF 40-50% per last echocardiogram one year ago. Stable size on x-ray. The patient does not have rales on physical exam nor significant edema of her lower extremities. 4. Morbid obesity: BMI 53.4; encourage diet and exercise 5. DVT prophylaxis: Lovenox 6. GI prophylaxis: none The patient is a full code. Time spent on admission orders and patient care approximately 45 minutes  Harrie Foreman, MD 07/01/2016, 7:11 AM

## 2016-07-01 NOTE — Progress Notes (Signed)
Subjective:  Patient has no complaints morning. No further chest pain. Complains of periodic dizziness.  Objective: Vital signs in last 24 hours: Temp:  [98.3 F (36.8 C)-98.5 F (36.9 C)] 98.4 F (36.9 C) (07/01 1117) Pulse Rate:  [51-64] 55 (07/01 1117) Resp:  [12-18] 18 (07/01 1117) BP: (127-155)/(70-95) 144/73 (07/01 1117) SpO2:  [92 %-99 %] 95 % (07/01 1117) Weight:  [154.2 kg (340 lb)-155.4 kg (342 lb 8 oz)] 155.4 kg (342 lb 8 oz) (07/01 0355) Weight change:  Last BM Date: 06/28/16  Intake/Output from previous day: No intake/output data recorded. Intake/Output this shift: Total I/O In: 120 [P.O.:120] Out: 100 [Urine:100]  General appearance: no distress Resp: clear to auscultation bilaterally and normal percussion bilaterally Cardio: regular rate and rhythm Neurologic: Grossly normal  Lab Results:  Recent Labs  06/30/16 1948  WBC 6.0  HGB 12.1  HCT 36.3  PLT 227   BMET  Recent Labs  06/30/16 1948  NA 139  K 3.6  CL 104  CO2 26  GLUCOSE 99  BUN 10  CREATININE 0.78  CALCIUM 9.2    Studies/Results: Dg Chest 1 View  Result Date: 07/01/2016 CLINICAL DATA:  Paresthesias to the right upper extremity. Hypertension. EXAM: CHEST 1 VIEW COMPARISON:  05/18/2016 FINDINGS: A single AP portable view of the chest demonstrates no focal airspace consolidation or alveolar edema. The lungs are grossly clear. There is no large effusion or pneumothorax. There is unchanged cardiomegaly. Cardiac and mediastinal contours are otherwise unremarkable. IMPRESSION: Unchanged cardiomegaly.  No acute findings. Electronically Signed   By: Andreas Newport M.D.   On: 07/01/2016 00:30   Ct Head Wo Contrast  Result Date: 07/01/2016 CLINICAL DATA:  Hypertension and right upper extremity paresthesias tonight. EXAM: CT HEAD WITHOUT CONTRAST TECHNIQUE: Contiguous axial images were obtained from the base of the skull through the vertex without intravenous contrast. COMPARISON:  06/13/2016  FINDINGS: Brain: There is no intracranial hemorrhage, mass or evidence of acute infarction. There is no extra-axial fluid collection. Gray matter and white matter appear normal. Cerebral volume is normal for age. Brainstem and posterior fossa are unremarkable. The CSF spaces appear normal. Vascular: No hyperdense vessel or unexpected calcification. Skull: Normal. Negative for fracture or focal lesion. Sinuses/Orbits: No acute finding. Other: None. IMPRESSION: Normal brain Electronically Signed   By: Andreas Newport M.D.   On: 07/01/2016 00:46    Medications: I have reviewed the patient's current medications.  Assessment/Plan: 1. Chest pain. Patient has no complaints today. Cardiac enzymes are negative so far. Discussed with cardiology. Because of her size and be difficult to do a nuclear stress test. There are go ahead and repeat her echocardiogram and consider Holter monitor at discharge. 2. Dizziness. She has symptoms at rest as well as with movement. Cannot elicit if this is true vertigo. However with her vague complaints have to wonder if she has paroxysmal A. fib. We'll go ahead and get MRI to evaluate for any neurological event. 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 She's had a repeat echo on this admission. We'll adjust medications best based on that. Also cardiology recommends outpatient monitor to rule out atrial fibrillation. 4. Hypothyroidism. She has history of radioactive iodine ablation. Her TSH is 7. She hasn't seen her endocrinologist in follow-up for over a year. I will go ahead and start her on some low-dose Synthroid however follow-up with her endocrinologist at next oil appointment  Total time spent 20 minutes  LOS: 0 days  Harrel Lemon D 07/01/2016, 1:44 PM

## 2016-07-01 NOTE — Consult Note (Signed)
HCPOA/AD materials dropped off with patient.  [facility] available to review as needed. 

## 2016-07-02 ENCOUNTER — Observation Stay: Admit: 2016-07-02 | Payer: Commercial Managed Care - HMO

## 2016-07-02 ENCOUNTER — Observation Stay (HOSPITAL_BASED_OUTPATIENT_CLINIC_OR_DEPARTMENT_OTHER)
Admit: 2016-07-02 | Discharge: 2016-07-02 | Disposition: A | Discharge status: Home / Self Care | Payer: Commercial Managed Care - HMO | Attending: Internal Medicine | Admitting: Internal Medicine

## 2016-07-02 ENCOUNTER — Ambulatory Visit: Payer: 59 | Admitting: Family Medicine

## 2016-07-02 ENCOUNTER — Observation Stay: Payer: Commercial Managed Care - HMO

## 2016-07-02 DIAGNOSIS — R072 Precordial pain: Secondary | ICD-10-CM | POA: Diagnosis not present

## 2016-07-02 DIAGNOSIS — E039 Hypothyroidism, unspecified: Secondary | ICD-10-CM | POA: Diagnosis not present

## 2016-07-02 DIAGNOSIS — I370 Nonrheumatic pulmonary valve stenosis: Secondary | ICD-10-CM

## 2016-07-02 DIAGNOSIS — R202 Paresthesia of skin: Secondary | ICD-10-CM | POA: Diagnosis not present

## 2016-07-02 DIAGNOSIS — R079 Chest pain, unspecified: Secondary | ICD-10-CM | POA: Diagnosis not present

## 2016-07-02 DIAGNOSIS — I10 Essential (primary) hypertension: Secondary | ICD-10-CM | POA: Diagnosis not present

## 2016-07-02 LAB — URINE CULTURE: SPECIAL REQUESTS: NORMAL

## 2016-07-02 NOTE — Progress Notes (Signed)
   07/02/16 0950  Clinical Encounter Type  Visited With Patient;Family  Visit Type Initial  Referral From Nurse;Patient   [facility] responded to request for an A.D. Pt needed an English copy of the paperwork. [place] left A.D paperwork and pt will contact [facility] office when ready to complete it.

## 2016-07-02 NOTE — Progress Notes (Deleted)
[place]  [phone]) [phone] [phone]  ID: Madeline Dominguez OB: [birth year]  MR#: [id number]  PFX#:[id number]  Patient Care Team: Steele Sizer, MD as PCP - General (Family Medicine)  CHIEF COMPLAINT: Bilateral pulmonary embolism, iron deficiency anemia.  INTERVAL HISTORY: Patient returns to clinic today for repeat laboratory work and further evaluation. She has noted increasing weakness and fatigue over the past several months. She otherwise feels well and is asymptomatic. She has no neurologic complaints. She denies any recent fevers. She denies any chest pain or shortness of breath. She has no nausea, vomiting, constipation, or diarrhea. She has no melena or hematochezia. She has no urinary complaints. Patient offers no further specific complaints today.  REVIEW OF SYSTEMS:   Review of Systems  Constitutional: Positive for malaise/fatigue. Negative for fever and weight loss.  Respiratory: Negative.  Negative for cough, hemoptysis and shortness of breath.   Cardiovascular: Negative.  Negative for chest pain and leg swelling.  Gastrointestinal: Negative.  Negative for abdominal pain, blood in stool and melena.  Genitourinary: Negative.   Musculoskeletal: Negative.   Neurological: Positive for weakness.  Psychiatric/Behavioral: Negative.  The patient is not nervous/anxious.     As per HPI. Otherwise, a complete review of systems is negative.  PAST MEDICAL HISTORY: Past Medical History:  Diagnosis Date  . Cough 07/25/2015  . Dilated cardiomyopathy ([place])   . H/O: hypothyroidism   . Heart palpitations   . Hypertension   . Iron deficiency   . Large breasts   . Morbid obesity ([place])   . Postpartum hemorrhage   . Thyroid disease     PAST SURGICAL HISTORY: Past Surgical History:  Procedure Laterality Date  . CESAREAN SECTION      FAMILY HISTORY: Family History  Problem Relation Age of Onset  . CVA Father        ADVANCED DIRECTIVES:      HEALTH MAINTENANCE: Social History  Substance Use Topics  . Smoking status: Never Smoker  . Smokeless tobacco: Never Used  . Alcohol use No     Comment: rarely     Colonoscopy:  PAP:  Bone density:  Lipid panel:  Allergies  Allergen Reactions  . Aspirin Nausea And Vomiting  . Hctz [Hydrochlorothiazide] Other (See Comments)    Nausea, cramping    No current facility-administered medications for this visit.    No current outpatient prescriptions on file.   Facility-Administered Medications Ordered in Other Visits  Medication Dose Route Frequency Provider Last Rate Last Dose  . acetaminophen (TYLENOL) tablet 650 mg  650 mg Oral Q6H PRN Harrie Foreman, MD   650 mg at 07/02/16 1819   Or  . acetaminophen (TYLENOL) suppository 650 mg  650 mg Rectal Q6H PRN Harrie Foreman, MD      . aspirin EC tablet 81 mg  81 mg Oral Daily Harrie Foreman, MD   81 mg at 07/02/16 0825  . docusate sodium (COLACE) capsule 100 mg  100 mg Oral BID Harrie Foreman, MD   100 mg at 07/01/16 2108  . enoxaparin (LOVENOX) injection 40 mg  40 mg Subcutaneous BID Harrie Foreman, MD   40 mg at 07/02/16 2102  . levothyroxine (SYNTHROID, LEVOTHROID) tablet 50 mcg  50 mcg Oral QAC breakfast Baxter Hire, MD   50 mcg at 07/02/16 0750  . ondansetron (ZOFRAN) tablet 4 mg  4 mg Oral Q6H PRN Harrie Foreman, MD       Or  . ondansetron (  ZOFRAN) injection 4 mg  4 mg Intravenous Q6H PRN Harrie Foreman, MD      . pantoprazole (PROTONIX) EC tablet 40 mg  40 mg Oral Daily Baxter Hire, MD   40 mg at 07/02/16 0825    OBJECTIVE: There were no vitals filed for this visit.   There is no height or weight on file to calculate BMI.    ECOG FS:0 - Asymptomatic  General: Well-developed, well-nourished, no acute distress. Eyes: Pink conjunctiva, anicteric sclera. Lungs: Clear to auscultation bilaterally. Heart: Regular rate and rhythm. No rubs, murmurs, or gallops. Abdomen: Soft, nontender,  nondistended. No organomegaly noted, normoactive bowel sounds. Musculoskeletal: No edema, cyanosis, or clubbing. Neuro: Alert, answering all questions appropriately. Cranial nerves grossly intact. Skin: No rashes or petechiae noted. Psych: Normal affect.   LAB RESULTS:  Lab Results  Component Value Date   NA 139 06/30/2016   K 3.6 06/30/2016   CL 104 06/30/2016   CO2 26 06/30/2016   GLUCOSE 99 06/30/2016   BUN 10 06/30/2016   CREATININE 0.78 06/30/2016   CALCIUM 9.2 06/30/2016   PROT 6.8 06/13/2016   ALBUMIN 3.6 06/13/2016   AST 8 (L) 06/13/2016   ALT 8 06/13/2016   ALKPHOS 78 06/13/2016   BILITOT 0.4 06/13/2016   GFRNONAA >60 06/30/2016   GFRAA >60 06/30/2016    Lab Results  Component Value Date   WBC 6.0 06/30/2016   NEUTROABS 4.5 05/18/2016   HGB 12.1 06/30/2016   HCT 36.3 06/30/2016   MCV 87.4 06/30/2016   PLT 227 06/30/2016   Lab Results  Component Value Date   IRON 12 (L) 05/03/2016   TIBC 312 05/03/2016   IRONPCTSAT 4 (L) 05/03/2016   Lab Results  Component Value Date   FERRITIN 7 (L) 05/03/2016     STUDIES: Dg Chest 1 View  Result Date: 07/01/2016 CLINICAL DATA:  Paresthesias to the right upper extremity. Hypertension. EXAM: CHEST 1 VIEW COMPARISON:  05/18/2016 FINDINGS: A single AP portable view of the chest demonstrates no focal airspace consolidation or alveolar edema. The lungs are grossly clear. There is no large effusion or pneumothorax. There is unchanged cardiomegaly. Cardiac and mediastinal contours are otherwise unremarkable. IMPRESSION: Unchanged cardiomegaly.  No acute findings. Electronically Signed   By: Andreas Newport M.D.   On: 07/01/2016 00:30   Ct Head Wo Contrast  Result Date: 07/01/2016 CLINICAL DATA:  Hypertension and right upper extremity paresthesias tonight. EXAM: CT HEAD WITHOUT CONTRAST TECHNIQUE: Contiguous axial images were obtained from the base of the skull through the vertex without intravenous contrast. COMPARISON:   06/13/2016 FINDINGS: Brain: There is no intracranial hemorrhage, mass or evidence of acute infarction. There is no extra-axial fluid collection. Gray matter and white matter appear normal. Cerebral volume is normal for age. Brainstem and posterior fossa are unremarkable. The CSF spaces appear normal. Vascular: No hyperdense vessel or unexpected calcification. Skull: Normal. Negative for fracture or focal lesion. Sinuses/Orbits: No acute finding. Other: None. IMPRESSION: Normal brain Electronically Signed   By: Andreas Newport M.D.   On: 07/01/2016 00:46   Ct Head Wo Contrast  Result Date: 06/13/2016 CLINICAL DATA:  Headache dizziness drowsiness, photosensitivity EXAM: CT HEAD WITHOUT CONTRAST TECHNIQUE: Contiguous axial images were obtained from the base of the skull through the vertex without intravenous contrast. COMPARISON:  None. FINDINGS: Brain: No evidence of acute infarction, hemorrhage, hydrocephalus, extra-axial collection or mass lesion/mass effect. Vascular: Negative for hyperdense vessel Skull: Negative Sinuses/Orbits: Paranasal sinuses clear. Air-fluid level right mastoid tip.  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 Other: None IMPRESSION: Normal CT of the brain Small air-fluid level right mastoid tip Electronically Signed   By: Franchot Gallo M.D.   On: 06/13/2016 13:56   Mr Brain Wo Contrast  Result Date: 07/02/2016 CLINICAL DATA:  Dizziness, numbness and hand tingling for a few days. Assess vertigo. History of hypertension. EXAM: MRI HEAD WITHOUT CONTRAST TECHNIQUE: Multiplanar, multiecho pulse sequences of the brain and surrounding structures were obtained without intravenous contrast. COMPARISON:  CT HEAD July 01, 2016 FINDINGS: BRAIN: No reduced diffusion to suggest acute ischemia. No susceptibility artifact to suggest hemorrhage. The ventricles and sulci are normal for patient's age. No suspicious parenchymal signal, mass or mass effect. No abnormal extra-axial fluid collections. VASCULAR:  Normal major intracranial vascular flow voids present at skull base. SKULL AND UPPER CERVICAL SPINE: No abnormal sellar expansion. No suspicious calvarial bone marrow signal. Craniocervical junction maintained. SINUSES/ORBITS: Trace mastoid effusions. Trace paranasal sinus mucosal thickening. The included ocular globes and orbital contents are non-suspicious. OTHER: None. IMPRESSION: Negative noncontrast MRI head Electronically Signed   By: Elon Alas M.D.   On: 07/02/2016 22:16    ASSESSMENT: Bilateral pulmonary embolism, iron deficiency anemia.  PLAN:    1. Bilateral pulmonary embolism: Patient's entire hypercoagulable workup was negative. CT scan results reviewed independently. Her PE was likely secondary to birth control pills. These have been discontinued. Patient has now completed 6 months of Eliquis. Patient was also previously educated on additional transient risk factors for blood clots. No further intervention is needed at this time. 2. Iron deficiency anemia: Patient's hemoglobin and iron stores have trended down significantly and she is now symptomatic. She has agreed to proceed with IV iron today. Return to clinic in 1 week for a second infusion. She will then return to clinic in 2 months with repeat laboratory work and further evaluation.  3. Hypertension: Patient's blood pressure is within normal today. Continue current medications. Treatment per primary care.  Patient expressed understanding and was in agreement with this plan. She also understands that She can call clinic at any time with any questions, concerns, or complaints.    Lloyd Huger, MD   07/02/2016 11:17 PM

## 2016-07-02 NOTE — Plan of Care (Signed)
Problem: Food- and Nutrition-Related Knowledge Deficit (NB-1.1) Goal: Nutrition education Formal process to instruct or train a patient/client in a skill or to impart knowledge to help patients/clients voluntarily manage or modify food choices and eating behavior to maintain or improve health. Outcome: Completed/Met Date Met: 07/02/16 Nutrition Education Note  RD identified need for nutrition education regarding a Heart Healthy diet during initial assessment.   Lipid Panel     Component Value Date/Time   CHOL 178 04/27/2016 1129   CHOL 190 12/03/2014 1211   TRIG 53 04/27/2016 1129   HDL 68 04/27/2016 1129   HDL 74 12/03/2014 1211   CHOLHDL 2.6 04/27/2016 1129   VLDL 11 04/27/2016 1129   LDLCALC 99 04/27/2016 1129   LDLCALC 105 (H) 12/03/2014 1211    RD provided "Heart Healthy Nutrition Therapy" handout from the [employer]. Reviewed patient's dietary recall. Provided examples on ways to decrease sodium and fat intake in diet. Discouraged intake of processed foods and use of salt shaker. Encouraged fresh fruits and vegetables as well as whole grain sources of carbohydrates to maximize fiber intake. Teach back method used.  Expect good compliance.  Body mass index is 54.63 kg/m. Pt meets criteria for obesity class III based on current BMI.  Current diet order is Heart Healthy, patient is consuming approximately 50-100% of meals at this time. Labs and medications reviewed.   Willey Blade, MS, RD, LDN Pager: [phone] After Hours Pager: [phone]

## 2016-07-02 NOTE — Progress Notes (Signed)
Subjective: Patient denies any chest pain overnight. Has had no dizziness but has not gotten up for fear of symptoms returning.  Objective: Vital signs in last 24 hours: Temp:  [98 F (36.7 C)-98.2 F (36.8 C)] 98 F (36.7 C) (07/02 1200) Pulse Rate:  [51-69] 56 (07/02 1200) Resp:  [18] 18 (07/02 1200) BP: (146-156)/(78-86) 146/84 (07/02 1200) SpO2:  [94 %-96 %] 95 % (07/02 1200) Weight:  [158.2 kg (348 lb 12.8 oz)] 158.2 kg (348 lb 12.8 oz) (07/02 0545) Weight change: 3.992 kg (8 lb 12.8 oz) Last BM Date: 07/01/16 (BM today per pt.)  Intake/Output from previous day: 07/01 0701 - 07/02 0700 In: 360 [P.O.:360] Out: 300 [Urine:300] Intake/Output this shift: Total I/O In: 240 [P.O.:240] Out: -   General appearance: no distress Resp: clear to auscultation bilaterally Cardio: regular rate and rhythm, S1, S2 normal, no murmur, click, rub or gallop Neurologic: Grossly normal  Lab Results:  Recent Labs  06/30/16 1948  WBC 6.0  HGB 12.1  HCT 36.3  PLT 227   BMET  Recent Labs  06/30/16 1948  NA 139  K 3.6  CL 104  CO2 26  GLUCOSE 99  BUN 10  CREATININE 0.78  CALCIUM 9.2    Studies/Results: Dg Chest 1 View  Result Date: 07/01/2016 CLINICAL DATA:  Paresthesias to the right upper extremity. Hypertension. EXAM: CHEST 1 VIEW COMPARISON:  05/18/2016 FINDINGS: A single AP portable view of the chest demonstrates no focal airspace consolidation or alveolar edema. The lungs are grossly clear. There is no large effusion or pneumothorax. There is unchanged cardiomegaly. Cardiac and mediastinal contours are otherwise unremarkable. IMPRESSION: Unchanged cardiomegaly.  No acute findings. Electronically Signed   By: Andreas Newport M.D.   On: 07/01/2016 00:30   Ct Head Wo Contrast  Result Date: 07/01/2016 CLINICAL DATA:  Hypertension and right upper extremity paresthesias tonight. EXAM: CT HEAD WITHOUT CONTRAST TECHNIQUE: Contiguous axial images were obtained from the base of the  skull through the vertex without intravenous contrast. COMPARISON:  06/13/2016 FINDINGS: Brain: There is no intracranial hemorrhage, mass or evidence of acute infarction. There is no extra-axial fluid collection. Gray matter and white matter appear normal. Cerebral volume is normal for age. Brainstem and posterior fossa are unremarkable. The CSF spaces appear normal. Vascular: No hyperdense vessel or unexpected calcification. Skull: Normal. Negative for fracture or focal lesion. Sinuses/Orbits: No acute finding. Other: None. IMPRESSION: Normal brain Electronically Signed   By: Andreas Newport M.D.   On: 07/01/2016 00:46    Medications: I have reviewed the patient's current medications.  Assessment/Plan: 1. Chest pain. Patient has no complaints today. Cardiac enzymes are negative so far. Discussed with cardiology. Because of her size and be difficult to do a nuclear stress test. There are go ahead and repeat her echocardiogram. If results abnormal then will pursue further ischemic work up. 2. Dizziness. No further symptoms but patient has not ambulated. Encouraged ambulation and monitor for symptoms. MRI ordered yesterday but has not been performed yet. If negative then may consider trial of ativert for symptoms. 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 Repeat echo ordered by cardiology this admission.  4. Hypothyroidism. She has history of radioactive iodine ablation. Her TSH is 7. She hasn't seen her endocrinologist in follow-up for over a year. I will go ahead and start her on some low-dose Synthroid however follow-up with her endocrinologist   LOS: 0 days   Baxter Hire 07/02/2016, 4:50 PM

## 2016-07-03 ENCOUNTER — Inpatient Hospital Stay: Payer: 59

## 2016-07-03 ENCOUNTER — Telehealth: Payer: Self-pay

## 2016-07-03 ENCOUNTER — Inpatient Hospital Stay: Payer: 59 | Admitting: Oncology

## 2016-07-03 DIAGNOSIS — R079 Chest pain, unspecified: Secondary | ICD-10-CM | POA: Diagnosis not present

## 2016-07-03 DIAGNOSIS — R42 Dizziness and giddiness: Secondary | ICD-10-CM | POA: Diagnosis not present

## 2016-07-03 DIAGNOSIS — E039 Hypothyroidism, unspecified: Secondary | ICD-10-CM | POA: Diagnosis not present

## 2016-07-03 LAB — ECHOCARDIOGRAM COMPLETE
Area-P 1/2: 3.79 cm2
CHL CUP MV DEC (S): 197
E decel time: 197 msec
E/e' ratio: 8.66
FS: 31 % (ref 28–44)
HEIGHTINCHES: 67 in
IV/PV OW: 0.59
LA ID, A-P, ES: 41 mm
LA diam end sys: 41 mm
LA vol A4C: 54.2 ml
LA vol index: 20.6 mL/m2
LA vol: 58.2 mL
LADIAMINDEX: 1.45 cm/m2
LV E/e' medial: 8.66
LV e' LATERAL: 10.1 cm/s
LVEEAVG: 8.66
MV pk A vel: 105 m/s
MVPG: 3 mmHg
MVPKEVEL: 87.5 m/s
P 1/2 time: 58 ms
PW: 15.4 mm — AB (ref 0.6–1.1)
RV LATERAL S' VELOCITY: 15.3 cm/s
TAPSE: 41.2 mm
TDI e' lateral: 10.1
TDI e' medial: 5.98
WEIGHTICAEL: 5580.8 [oz_av]

## 2016-07-03 MED ORDER — MECLIZINE HCL 32 MG PO TABS: 32.0000 mg | ORAL_TABLET | Freq: Three times a day (TID) | ORAL | 0 refills | Status: DC | PRN

## 2016-07-03 MED ORDER — LEVOTHYROXINE SODIUM 50 MCG PO TABS: 50.0000 ug | ORAL_TABLET | Freq: Every day | ORAL | 2 refills | Status: DC

## 2016-07-03 NOTE — Discharge Summary (Signed)
Physician Discharge Summary  Patient ID: Madeline Dominguez MRN: [id number] DOB/AGE: 1965-09-30 51 y.o.  Admit date: 06/30/2016 Discharge date: 07/03/2016  Admission Diagnoses:1. Chest pain 2. Dizziness 3. History of cardiomyopathy 4. Hypothyroidism 5. Sleep apnea  Discharge Diagnoses: 1. Chest pain 2. Dizziness 3. History of cardiomyopathy 4. Hypothyroidism 5. Sleep apnea Active Problems:   Chest pain   Discharged Condition: stable  Hospital Course: 1. Chest pain. patient was ruled out with cardiac enzymes. Cardiology evaluated her and elected not to do any further ischemic workup.  2. Dizziness. MRI was negative for any acute stroke. Patient was started on meclizine when necessary for symptoms and will follow-up for primary care doctor.  3. Cardio myopathy. She's had one echo that showed a mildly depressed EF and another showed normal EF. Echo results for this admission are pending. She will follow-up with cardiology for this.  4. Hypothyroidism. Start her on 50 g of Synthroid. She will need to follow-up with primary care doctor for recheck of her TSH 5. Obstructive sleep apnea. Suspect patient may have pulmonary hypertension. This may be revealed on her echo. Discussed with her about compliance with her CPAP machine at night  Total time spent 35 minutes Consults: cardiology     Discharge Exam: Blood pressure 139/77, pulse (!) 56, temperature 98.3 F (36.8 C), temperature source Oral, resp. rate 17, height 5\' 7"  (1.702 m), weight (!) 157.9 kg (348 lb), SpO2 97 %. General appearance: no distress Resp: clear to auscultation bilaterally Cardio: regular rate and rhythm, S1, S2 normal, no murmur, click, rub or gallop  Disposition: 01-Home or Self Care  Discharge Instructions    Diet - low sodium heart healthy    Complete by:  As directed    Increase activity slowly    Complete by:  As directed    Other Restrictions    Complete by:  As directed    Patient may return to  work on July 9.     Allergies as of 07/03/2016      Reactions   Aspirin Nausea And Vomiting   Hctz [hydrochlorothiazide] Other (See Comments)   Nausea, cramping      Medication List    TAKE these medications   cyanocobalamin 1000 MCG tablet Take 1 tablet (1,000 mcg total) by mouth daily.   cyclobenzaprine 10 MG tablet Commonly known as:  FLEXERIL Take 10 mg by mouth 3 (three) times daily as needed for muscle spasms.   ferrous sulfate 325 (65 FE) MG tablet Take 1 tablet (325 mg total) by mouth 2 (two) times daily with a meal. What changed:  when to take this   levothyroxine 50 MCG tablet Commonly known as:  SYNTHROID, LEVOTHROID Take 1 tablet (50 mcg total) by mouth daily before breakfast. Start taking on:  07/04/2016   lisinopril 10 MG tablet Commonly known as:  PRINIVIL,ZESTRIL Take 1 tablet (10 mg total) by mouth daily.   meclizine 32 MG tablet Commonly known as:  ANTIVERT Take 1 tablet (32 mg total) by mouth 3 (three) times daily as needed for dizziness.      Follow-up Information    Steele Sizer, MD Follow up in 1 week(s).   Specialty:  Family Medicine Contact information: [address] [place] [address] [phone]           Signed: Baxter Hire 07/03/2016, 9:09 AM

## 2016-07-03 NOTE — Progress Notes (Signed)
A&O. Independent. No complaints. Slept well through the night. For MRI today.

## 2016-07-03 NOTE — Telephone Encounter (Signed)
Pt stated that she has been in the hospital for the last 4 days and that she has not taken any of her B/P medication(lisinopril) she wants to know if you want her to start it back once she leaves the hospital.

## 2016-07-03 NOTE — Telephone Encounter (Signed)
Pt has appt scheduled for 07/10/2016

## 2016-07-03 NOTE — Progress Notes (Signed)
Discharge instructions along with home medication list and follow up gone over with patient. Patient verbalized that she understood instructions. Two prescriptions electronically submitted. Iv removed x1. Telemetry removed. Patient to be discharged home on ra. No distress noted.

## 2016-07-03 NOTE — Telephone Encounter (Signed)
Please schedule appointment.

## 2016-07-06 ENCOUNTER — Telehealth: Payer: Self-pay | Admitting: Family Medicine

## 2016-07-06 NOTE — Telephone Encounter (Signed)
Pt is having reaction to the levothyroxine. States it makes her nervous, jittery, and anxious. Wanted to know if she should continue taking the medication or can she half the dosage. Please advise [phone]

## 2016-07-06 NOTE — Telephone Encounter (Signed)
Patient notified

## 2016-07-06 NOTE — Telephone Encounter (Signed)
PER DR SOWLES PT NEEDS TO TAKE 1/2 THE DOSE. PT CALLED BACK IN AND WAS TOLD TO LET HER KNOW THIS PER SOWLES

## 2016-07-10 ENCOUNTER — Encounter: Payer: Self-pay | Admitting: Family Medicine

## 2016-07-10 ENCOUNTER — Ambulatory Visit (INDEPENDENT_AMBULATORY_CARE_PROVIDER_SITE_OTHER): Payer: 59 | Admitting: Family Medicine

## 2016-07-10 VITALS — BP 138/76 | HR 88 | Temp 98.3°F | Resp 18 | Ht 67.0 in | Wt 334.4 lb

## 2016-07-10 DIAGNOSIS — Z923 Personal history of irradiation: Secondary | ICD-10-CM | POA: Diagnosis not present

## 2016-07-10 DIAGNOSIS — E0520 Thyrotoxicosis with toxic multinodular goiter without thyrotoxic crisis or storm: Secondary | ICD-10-CM | POA: Diagnosis not present

## 2016-07-10 DIAGNOSIS — R630 Anorexia: Secondary | ICD-10-CM

## 2016-07-10 DIAGNOSIS — Z09 Encounter for follow-up examination after completed treatment for conditions other than malignant neoplasm: Secondary | ICD-10-CM

## 2016-07-10 DIAGNOSIS — E0590 Thyrotoxicosis, unspecified without thyrotoxic crisis or storm: Secondary | ICD-10-CM | POA: Insufficient documentation

## 2016-07-10 DIAGNOSIS — R42 Dizziness and giddiness: Secondary | ICD-10-CM

## 2016-07-10 DIAGNOSIS — I422 Other hypertrophic cardiomyopathy: Secondary | ICD-10-CM | POA: Diagnosis not present

## 2016-07-10 DIAGNOSIS — R946 Abnormal results of thyroid function studies: Secondary | ICD-10-CM | POA: Diagnosis not present

## 2016-07-10 DIAGNOSIS — R0789 Other chest pain: Secondary | ICD-10-CM

## 2016-07-10 DIAGNOSIS — R7989 Other specified abnormal findings of blood chemistry: Secondary | ICD-10-CM

## 2016-07-10 MED ORDER — OMEPRAZOLE 40 MG PO CPDR: 40.0000 mg | DELAYED_RELEASE_CAPSULE | Freq: Every day | ORAL | 0 refills | Status: DC

## 2016-07-10 MED ORDER — MECLIZINE HCL 25 MG PO TABS: 25.0000 mg | ORAL_TABLET | Freq: Three times a day (TID) | ORAL | 0 refills | Status: DC | PRN

## 2016-07-10 NOTE — Progress Notes (Signed)
Name: Madeline Dominguez   MRN: [id number]    DOB: 11-Dec-1965   Date:07/10/2016       Progress Note  Subjective  Chief Complaint  Chief Complaint  Patient presents with  . Hospitalization Follow-up    For HTN, veritigo ,generalized weakness, and chest pain    HPI  Dizziness/chest pain/uncontrolled HTN: she was seen at [facility] 4 times since 05/2016 and admitted to [facility] on 06/30/2016. She still feels tired, dizzy and has chest pain and episodes of palpitation. Unknown cause. She was sent home on Synthroid 50 mcg but had stop because it was causing her to have leg pain, anxiety, palpitation and worsening of dizziness. She has appointment scheduled with nephrologist, ENT and Endo but does not have follow up with cardiologist .  Echo showed LVH and based on cardiologist note she may have a coronary calcium score test instead of myoview. 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  BP has improved, but still spikes at times. Taking lisinopril daily, but not using CPAP every night. She did not start PPI as suggested by cardiologist because rx was not sent to her pharmacy. Chest pain is sharp and at times heaviness on anterior chest. Dizziness is described as light headed, no spinning sensation, not aggravated by standing up, not sure of aggravating factors.  She has noticed decrease in appetite and stomach feels hard, she has been eating less and has noticed decrease in bowel movements.    Patient Active Problem List   Diagnosis Date Noted  . 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 History of radioactive iodine thyroid ablation   . Chest pain 07/01/2016  . 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 Right leg DVT ([place]) 08/01/2015  . Iron deficiency anemia 07/27/2015  . B12 deficiency 07/27/2015  . Bilateral pulmonary embolism ([place]) 07/25/2015  . Adrenal nodule  ([place]) 07/25/2015  . Pulmonary nodules/lesions, multiple 07/25/2015  . OSA (obstructive sleep apnea) 05/18/2015  . Thyroid nodule 12/17/2014  . Heavy menstrual period 09/02/2014  . Gastroesophageal reflux disease without esophagitis 09/02/2014  . Extreme obesity 06/22/2014  . Large breasts 06/22/2014  . Anemia, iron deficiency 06/22/2014  . H/O hyperthyroidism 06/22/2014  . Right ventricular dilation 06/22/2014  . 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    Past Surgical History:  Procedure Laterality Date  . CESAREAN SECTION      Family History  Problem Relation Age of Onset  . CVA Father     Social History   Social History  . Marital status: Married    Spouse name: N/A  . Number of children: N/A  . Years of education: N/A   Occupational History  . Not on file.   Social History Main Topics  . Smoking status: Never Smoker  . Smokeless tobacco: Never Used  . Alcohol use No     Comment: rarely  . Drug use: No  . Sexual activity: Yes    Partners: Male    Birth control/ protection: Condom   Other Topics Concern  . Not on file   Social History Narrative   Lives at home with husband.     Current Outpatient Prescriptions:  .  cyclobenzaprine (FLEXERIL) 10 MG tablet, Take 10 mg by mouth 3 (three) times daily as needed for muscle spasms. , Disp: , Rfl:  .  ferrous sulfate 325 (65 FE) MG tablet, Take 1 tablet (325 mg total) by mouth 2 (two) times daily with a meal. (Patient taking differently: Take 325 mg by mouth daily. ), Disp: 180 tablet, Rfl: 1 .  lisinopril (PRINIVIL,ZESTRIL) 10 MG tablet, Take 1 tablet (10 mg total) by mouth daily., Disp: 30 tablet, Rfl: 0 .  meclizine (ANTIVERT) 32 MG tablet, Take 1 tablet (32 mg total) by mouth 3 (three) times daily as needed for dizziness., Disp: 30 tablet, Rfl: 0 .  omeprazole (PRILOSEC) 40 MG capsule, Take 1 capsule (40 mg total) by mouth daily., Disp: 30 capsule, Rfl: 0 .   vitamin B-12 1000 MCG tablet, Take 1 tablet (1,000 mcg total) by mouth daily., Disp: 30 tablet, Rfl: 0  Allergies  Allergen Reactions  . Aspirin Nausea And Vomiting  . Hctz [Hydrochlorothiazide] Other (See Comments)    Nausea, cramping     ROS  Constitutional: Negative for fever, positive for weight change.  Respiratory: Negative for cough, but she has intermittent  shortness of breath.   Cardiovascular: Positive for chest pain intermittently and also  palpitations.  Gastrointestinal: Negative for abdominal pain, no bowel changes.  Musculoskeletal: Negative for gait problem or joint swelling.  Skin: Negative for rash.   Neurological: positive for dizziness but no headache.  No other specific complaints in a complete review of systems (except as listed in HPI above).  Objective  Vitals:   07/10/16 1557  BP: 138/76  Pulse: 88  Resp: 18  Temp: 98.3 F (36.8 C)  SpO2: 91%  Weight: (!) 334 lb 7 oz (151.7 kg)  Height: _0  (1.702 m)    Body mass index is 52.38 kg/m.  Physical Exam   Constitutional: Patient appears well-developed and well-nourished. Obese  No distress.  HEENT: head atraumatic, normocephalic, pupils equal and reactive to light, neck supple, throat within normal limits Cardiovascular: Normal rate, regular rhythm and normal heart sounds.  No murmur heard. Trace BLE edema. Pulmonary/Chest: Effort normal and breath sounds normal. No respiratory distress. Abdominal: Soft.  There is no tenderness. Psychiatric: Patient has a normal mood and affect. behavior is normal. Judgment and thought content normal. Neurological: slow gait, no nystagmus, normal grip, romberg negative , cranial nerves negative   Recent Results (from the past 2160 hour(s))  COMPLETE METABOLIC PANEL WITH GFR     Status: Abnormal   Collection Time: 04/27/16 11:29 AM  Result Value Ref Range   Sodium 141 135 - 146 mmol/L   Potassium 4.2 3.5 - 5.3 mmol/L   Chloride 105 98 - 110 mmol/L   CO2 31  20 - 31 mmol/L   Glucose, Bld 104 (H) 65 - 99 mg/dL   BUN 9 7 - 25 mg/dL   Creat 0.68 0.50 - 1.10 mg/dL   Total Bilirubin 0.2 0.2 - 1.2 mg/dL   Alkaline Phosphatase 64 33 - 115 U/L   AST 11 10 - 35 U/L   ALT 10 6 - 29 U/L   Total Protein 6.6 6.1 - 8.1 g/dL   Albumin 3.5 (L) 3.6 - 5.1 g/dL   Calcium 8.6 8.6 - 10.2 mg/dL   GFR, Est African American >89 >=60 mL/min   GFR, Est Non African American >89 >=60 mL/min  Lipid panel     Status: None   Collection Time: 04/27/16 11:29 AM  Result Value Ref Range   Cholesterol 178 <200 mg/dL   Triglycerides 53 <150 mg/dL   HDL 68 >50 mg/dL   Total CHOL/HDL Ratio 2.6 <  5.0 Ratio   VLDL 11 <30 mg/dL   LDL Cholesterol 99 <100 mg/dL  Hemoglobin A1c     Status: None   Collection Time: 04/27/16 11:29 AM  Result Value Ref Range   Hgb A1c MFr Bld 4.9 <5.7 %    Comment:   For the purpose of screening for the presence of diabetes:   <5.7%       Consistent with the absence of diabetes 5.7-6.4 %   Consistent with increased risk for diabetes (prediabetes) >=6.5 %     Consistent with diabetes   This assay result is consistent with a decreased risk of diabetes.   Currently, no consensus exists regarding use of hemoglobin A1c for diagnosis of diabetes in children.   According to American Diabetes Association (ADA) guidelines, hemoglobin A1c <7.0% represents optimal control in non-pregnant diabetic patients. Different metrics may apply to specific patient populations. Standards of Medical Care in Diabetes (ADA).      Mean Plasma Glucose 94 mg/dL  Insulin, fasting     Status: None   Collection Time: 04/27/16 11:29 AM  Result Value Ref Range   Insulin fasting, serum 10.3 2.0 - 19.6 uIU/mL    Comment:   This insulin assay shows strong cross-reactivity for some insulin analogs (lispro, aspart, and glargine) and much lower cross-reactivity with others (detemir, glulisine).   Stimulated Insulin reference intervals were established using the Siemens  Immulite assay. These values are provided for general guidance only.   CBC with Differential     Status: Abnormal   Collection Time: 05/03/16 10:40 AM  Result Value Ref Range   WBC 7.0 3.6 - 11.0 K/uL   RBC 3.19 (L) 3.80 - 5.20 MIL/uL   Hemoglobin 8.3 (L) 12.0 - 16.0 g/dL   HCT 25.9 (L) 35.0 - 47.0 %   MCV 81.3 80.0 - 100.0 fL   MCH 25.9 (L) 26.0 - 34.0 pg   MCHC 31.9 (L) 32.0 - 36.0 g/dL   RDW 18.3 (H) 11.5 - 14.5 %   Platelets 335 150 - 440 K/uL   Neutrophils Relative % 53 %   Neutro Abs 3.8 1.4 - 6.5 K/uL   Lymphocytes Relative 35 %   Lymphs Abs 2.4 1.0 - 3.6 K/uL   Monocytes Relative 9 %   Monocytes Absolute 0.6 0.2 - 0.9 K/uL   Eosinophils Relative 3 %   Eosinophils Absolute 0.2 0 - 0.7 K/uL   Basophils Relative 0 %   Basophils Absolute 0.0 0 - 0.1 K/uL  Ferritin     Status: Abnormal   Collection Time: 05/03/16 10:40 AM  Result Value Ref Range   Ferritin 7 (L) 11 - 307 ng/mL  Iron and TIBC     Status: Abnormal   Collection Time: 05/03/16 10:40 AM  Result Value Ref Range   Iron 12 (L) 28 - 170 ug/dL   TIBC 312 250 - 450 ug/dL   Saturation Ratios 4 (L) 10.4 - 31.8 %   UIBC 300 ug/dL  CBC with Differential/Platelet     Status: Abnormal   Collection Time: 05/18/16  4:49 PM  Result Value Ref Range   WBC 6.9 3.6 - 11.0 K/uL   RBC 3.91 3.80 - 5.20 MIL/uL   Hemoglobin 10.9 (L) 12.0 - 16.0 g/dL   HCT 33.6 (L) 35.0 - 47.0 %   MCV 86.1 80.0 - 100.0 fL   MCH 27.9 26.0 - 34.0 pg   MCHC 32.4 32.0 - 36.0 g/dL   RDW 22.5 (H) 11.5 - 14.5 %  Platelets 256 150 - 440 K/uL   Neutrophils Relative % 64 %   Neutro Abs 4.5 1.4 - 6.5 K/uL   Lymphocytes Relative 25 %   Lymphs Abs 1.7 1.0 - 3.6 K/uL   Monocytes Relative 7 %   Monocytes Absolute 0.5 0.2 - 0.9 K/uL   Eosinophils Relative 3 %   Eosinophils Absolute 0.2 0 - 0.7 K/uL   Basophils Relative 1 %   Basophils Absolute 0.0 0 - 0.1 K/uL  Comprehensive metabolic panel     Status: Abnormal   Collection Time: 05/18/16  4:49 PM   Result Value Ref Range   Sodium 142 135 - 145 mmol/L   Potassium 3.8 3.5 - 5.1 mmol/L   Chloride 107 101 - 111 mmol/L   CO2 30 22 - 32 mmol/L   Glucose, Bld 99 65 - 99 mg/dL   BUN 13 6 - 20 mg/dL   Creatinine, Ser 0.73 0.44 - 1.00 mg/dL   Calcium 9.2 8.9 - 10.3 mg/dL   Total Protein 7.8 6.5 - 8.1 g/dL   Albumin 3.6 3.5 - 5.0 g/dL   AST 12 (L) 15 - 41 U/L   ALT 10 (L) 14 - 54 U/L   Alkaline Phosphatase 77 38 - 126 U/L   Total Bilirubin 0.3 0.3 - 1.2 mg/dL   GFR calc non Af Amer >60 >60 mL/min   GFR calc Af Amer >60 >60 mL/min    Comment: (NOTE) The eGFR has been calculated using the CKD EPI equation. This calculation has not been validated in all clinical situations. eGFR's persistently <60 mL/min signify possible Chronic Kidney Disease.    Anion gap 5 5 - 15  Troponin I     Status: None   Collection Time: 05/18/16  4:49 PM  Result Value Ref Range   Troponin I <0.03 <0.03 ng/mL  Brain natriuretic peptide     Status: None   Collection Time: 05/18/16  4:49 PM  Result Value Ref Range   B Natriuretic Peptide 64.0 0.0 - 100.0 pg/mL  COMPLETE METABOLIC PANEL WITH GFR     Status: Abnormal   Collection Time: 06/13/16 12:31 PM  Result Value Ref Range   Sodium 140 135 - 146 mmol/L   Potassium 3.9 3.5 - 5.3 mmol/L   Chloride 101 98 - 110 mmol/L   CO2 32 (H) 20 - 31 mmol/L   Glucose, Bld 106 (H) 65 - 99 mg/dL   BUN 9 7 - 25 mg/dL   Creat 0.69 0.50 - 1.10 mg/dL   Total Bilirubin 0.4 0.2 - 1.2 mg/dL   Alkaline Phosphatase 78 33 - 115 U/L   AST 8 (L) 10 - 35 U/L   ALT 8 6 - 29 U/L   Total Protein 6.8 6.1 - 8.1 g/dL   Albumin 3.6 3.6 - 5.1 g/dL   Calcium 9.5 8.6 - 10.2 mg/dL   GFR, Est African American >89 >=60 mL/min   GFR, Est Non African American >89 >=60 mL/min  TSH     Status: None   Collection Time: 06/13/16 12:31 PM  Result Value Ref Range   TSH 3.12 mIU/L    Comment:   Reference Range   > or = 20 Years  0.40-4.50   Pregnancy Range First trimester   0.26-2.66 Second trimester 0.55-2.73 Third trimester  0.43-2.91     Basic metabolic panel     Status: Abnormal   Collection Time: 06/13/16  1:02 PM  Result Value Ref Range   Sodium 137 135 -  145 mmol/L   Potassium 3.4 (L) 3.5 - 5.1 mmol/L   Chloride 100 (L) 101 - 111 mmol/L   CO2 30 22 - 32 mmol/L   Glucose, Bld 110 (H) 65 - 99 mg/dL   BUN 9 6 - 20 mg/dL   Creatinine, Ser 0.54 0.44 - 1.00 mg/dL   Calcium 9.5 8.9 - 10.3 mg/dL   GFR calc non Af Amer >60 >60 mL/min   GFR calc Af Amer >60 >60 mL/min    Comment: (NOTE) The eGFR has been calculated using the CKD EPI equation. This calculation has not been validated in all clinical situations. eGFR's persistently <60 mL/min signify possible Chronic Kidney Disease.    Anion gap 7 5 - 15  CBC     Status: Abnormal   Collection Time: 06/13/16  1:02 PM  Result Value Ref Range   WBC 5.3 3.6 - 11.0 K/uL   RBC 4.22 3.80 - 5.20 MIL/uL   Hemoglobin 12.0 12.0 - 16.0 g/dL   HCT 36.3 35.0 - 47.0 %   MCV 86.0 80.0 - 100.0 fL   MCH 28.4 26.0 - 34.0 pg   MCHC 33.0 32.0 - 36.0 g/dL   RDW 19.8 (H) 11.5 - 14.5 %   Platelets 253 150 - 440 K/uL  Troponin I     Status: None   Collection Time: 06/13/16  1:02 PM  Result Value Ref Range   Troponin I <0.03 <0.03 ng/mL  TSH     Status: None   Collection Time: 06/13/16  1:02 PM  Result Value Ref Range   TSH 3.895 0.350 - 4.500 uIU/mL    Comment: Performed by a 3rd Generation assay with a functional sensitivity of <=0.01 uIU/mL.  T4, free     Status: None   Collection Time: 06/13/16  1:02 PM  Result Value Ref Range   Free T4 0.77 0.61 - 1.12 ng/dL    Comment: (NOTE) Biotin ingestion may interfere with free T4 tests. If the results are inconsistent with the TSH level, previous test results, or the clinical presentation, then consider biotin interference. If needed, order repeat testing after stopping biotin.   hCG, quantitative, pregnancy     Status: None   Collection Time: 06/13/16  1:02 PM   Result Value Ref Range   hCG, Beta Chain, Quant, S 1 <5 mIU/mL    Comment:          GEST. AGE      CONC.  (mIU/mL)   <=1 WEEK        5 - 50     2 WEEKS       50 - 500     3 WEEKS       100 - 10,000     4 WEEKS     1,000 - 30,000     5 WEEKS     3,500 - 115,000   6-8 WEEKS     12,000 - 270,000    12 WEEKS     15,000 - 220,000        FEMALE AND NON-PREGNANT FEMALE:     LESS THAN 5 mIU/mL   Glucose, capillary     Status: Abnormal   Collection Time: 06/13/16  1:40 PM  Result Value Ref Range   Glucose-Capillary 106 (H) 65 - 99 mg/dL  Urinalysis, Complete w Microscopic     Status: Abnormal   Collection Time: 06/13/16  4:27 PM  Result Value Ref Range   Color, Urine YELLOW (A) YELLOW  APPearance HAZY (A) CLEAR   Specific Gravity, Urine 1.013 1.005 - 1.030   pH 6.0 5.0 - 8.0   Glucose, UA NEGATIVE NEGATIVE mg/dL   Hgb urine dipstick MODERATE (A) NEGATIVE   Bilirubin Urine NEGATIVE NEGATIVE   Ketones, ur NEGATIVE NEGATIVE mg/dL   Protein, ur NEGATIVE NEGATIVE mg/dL   Nitrite POSITIVE (A) NEGATIVE   Leukocytes, UA TRACE (A) NEGATIVE   RBC / HPF 0-5 0 - 5 RBC/hpf   WBC, UA 0-5 0 - 5 WBC/hpf   Bacteria, UA MANY (A) NONE SEEN   Squamous Epithelial / LPF 0-5 (A) NONE SEEN   Mucous PRESENT   Troponin I     Status: None   Collection Time: 06/13/16  4:27 PM  Result Value Ref Range   Troponin I <0.03 <0.03 ng/mL  Basic metabolic panel     Status: Abnormal   Collection Time: 06/18/16  9:36 AM  Result Value Ref Range   Sodium 136 135 - 145 mmol/L   Potassium 3.4 (L) 3.5 - 5.1 mmol/L   Chloride 100 (L) 101 - 111 mmol/L   CO2 30 22 - 32 mmol/L   Glucose, Bld 114 (H) 65 - 99 mg/dL   BUN 13 6 - 20 mg/dL   Creatinine, Ser 0.86 0.44 - 1.00 mg/dL   Calcium 9.2 8.9 - 10.3 mg/dL   GFR calc non Af Amer >60 >60 mL/min   GFR calc Af Amer >60 >60 mL/min    Comment: (NOTE) The eGFR has been calculated using the CKD EPI equation. This calculation has not been validated in all clinical  situations. eGFR's persistently <60 mL/min signify possible Chronic Kidney Disease.    Anion gap 6 5 - 15  CBC     Status: Abnormal   Collection Time: 06/18/16  9:36 AM  Result Value Ref Range   WBC 5.5 3.6 - 11.0 K/uL   RBC 4.46 3.80 - 5.20 MIL/uL   Hemoglobin 12.5 12.0 - 16.0 g/dL   HCT 38.1 35.0 - 47.0 %   MCV 85.4 80.0 - 100.0 fL   MCH 28.0 26.0 - 34.0 pg   MCHC 32.8 32.0 - 36.0 g/dL   RDW 19.4 (H) 11.5 - 14.5 %   Platelets 247 150 - 440 K/uL  Troponin I     Status: None   Collection Time: 06/18/16  9:56 AM  Result Value Ref Range   Troponin I <0.03 <0.03 ng/mL  APTT     Status: None   Collection Time: 06/18/16  9:56 AM  Result Value Ref Range   aPTT 27 24 - 36 seconds  Urinalysis, Complete w Microscopic     Status: Abnormal   Collection Time: 06/18/16  2:20 PM  Result Value Ref Range   Color, Urine YELLOW (A) YELLOW   APPearance HAZY (A) CLEAR   Specific Gravity, Urine 1.012 1.005 - 1.030   pH 6.0 5.0 - 8.0   Glucose, UA NEGATIVE NEGATIVE mg/dL   Hgb urine dipstick MODERATE (A) NEGATIVE   Bilirubin Urine NEGATIVE NEGATIVE   Ketones, ur NEGATIVE NEGATIVE mg/dL   Protein, ur NEGATIVE NEGATIVE mg/dL   Nitrite NEGATIVE NEGATIVE   Leukocytes, UA SMALL (A) NEGATIVE   RBC / HPF 0-5 0 - 5 RBC/hpf   WBC, UA 6-30 0 - 5 WBC/hpf   Bacteria, UA RARE (A) NONE SEEN   Squamous Epithelial / LPF 6-30 (A) NONE SEEN   Mucous PRESENT   Urine culture     Status: Abnormal   Collection  Time: 06/18/16  2:20 PM  Result Value Ref Range   Specimen Description URINE, RANDOM    Special Requests NONE    Culture MULTIPLE SPECIES PRESENT, SUGGEST RECOLLECTION (A)    Report Status 06/20/2016 FINAL   Fibrin derivatives D-Dimer ([facility] only)     Status: None   Collection Time: 06/20/16  1:42 PM  Result Value Ref Range   Fibrin derivatives D-dimer ([facility]) 386.70 0.00 - 499.00    Comment: (NOTE) <> Exclusion of Venous Thromboembolism (VTE) - OUTPATIENT ONLY   (Emergency Department or [place])    0-499 ng/ml (FEU): With a low to intermediate pretest probability                      for VTE this test result excludes the diagnosis                      of VTE.   >499 ng/ml (FEU) : VTE not excluded; additional work up for VTE is                      required. <> Testing on Inpatients and Evaluation of Disseminated Intravascular   Coagulation ([facility]IC) Reference Range:   0-499 ng/ml (FEU)   Basic metabolic panel     Status: None   Collection Time: 06/30/16  7:48 PM  Result Value Ref Range   Sodium 139 135 - 145 mmol/L   Potassium 3.6 3.5 - 5.1 mmol/L   Chloride 104 101 - 111 mmol/L   CO2 26 22 - 32 mmol/L   Glucose, Bld 99 65 - 99 mg/dL   BUN 10 6 - 20 mg/dL   Creatinine, Ser 0.78 0.44 - 1.00 mg/dL   Calcium 9.2 8.9 - 10.3 mg/dL   GFR calc non Af Amer >60 >60 mL/min   GFR calc Af Amer >60 >60 mL/min    Comment: (NOTE) The eGFR has been calculated using the CKD EPI equation. This calculation has not been validated in all clinical situations. eGFR's persistently <60 mL/min signify possible Chronic Kidney Disease.    Anion gap 9 5 - 15  CBC     Status: Abnormal   Collection Time: 06/30/16  7:48 PM  Result Value Ref Range   WBC 6.0 3.6 - 11.0 K/uL   RBC 4.15 3.80 - 5.20 MIL/uL   Hemoglobin 12.1 12.0 - 16.0 g/dL   HCT 36.3 35.0 - 47.0 %   MCV 87.4 80.0 - 100.0 fL   MCH 29.1 26.0 - 34.0 pg   MCHC 33.4 32.0 - 36.0 g/dL   RDW 18.6 (H) 11.5 - 14.5 %   Platelets 227 150 - 440 K/uL  Troponin I     Status: None   Collection Time: 06/30/16  7:48 PM  Result Value Ref Range   Troponin I <0.03 <0.03 ng/mL  Urinalysis, Complete w Microscopic     Status: Abnormal   Collection Time: 07/01/16 12:00 AM  Result Value Ref Range   Color, Urine YELLOW (A) YELLOW   APPearance HAZY (A) CLEAR   Specific Gravity, Urine 1.024 1.005 - 1.030   pH 5.0 5.0 - 8.0   Glucose, UA NEGATIVE NEGATIVE mg/dL   Hgb urine dipstick MODERATE (A) NEGATIVE   Bilirubin Urine NEGATIVE NEGATIVE   Ketones, ur 20 (A)  NEGATIVE mg/dL   Protein, ur NEGATIVE NEGATIVE mg/dL   Nitrite NEGATIVE NEGATIVE   Leukocytes, UA TRACE (A) NEGATIVE   RBC / HPF 0-5 0 -  5 RBC/hpf   WBC, UA 6-30 0 - 5 WBC/hpf   Bacteria, UA RARE (A) NONE SEEN   Squamous Epithelial / LPF 6-30 (A) NONE SEEN   Mucous PRESENT   Urine Culture     Status: Abnormal   Collection Time: 07/01/16 12:00 AM  Result Value Ref Range   Specimen Description URINE, RANDOM    Special Requests Normal    Culture MULTIPLE SPECIES PRESENT, SUGGEST RECOLLECTION (A)    Report Status 07/02/2016 FINAL   TSH     Status: Abnormal   Collection Time: 07/01/16  3:50 AM  Result Value Ref Range   TSH 6.699 (H) 0.350 - 4.500 uIU/mL    Comment: Performed by a 3rd Generation assay with a functional sensitivity of <=0.01 uIU/mL.  Troponin I     Status: None   Collection Time: 07/01/16  3:50 AM  Result Value Ref Range   Troponin I <0.03 <0.03 ng/mL  Troponin I     Status: None   Collection Time: 07/01/16 10:08 AM  Result Value Ref Range   Troponin I <0.03 <0.03 ng/mL  Troponin I     Status: None   Collection Time: 07/01/16  4:19 PM  Result Value Ref Range   Troponin I <0.03 <0.03 ng/mL  ECHOCARDIOGRAM COMPLETE     Status: Abnormal   Collection Time: 07/02/16  8:06 PM  Result Value Ref Range   Weight 5,580.8 oz   Height 67 in   BP 146/84 mmHg   LV PW d 15.4 (A) 0.6 - 1.1 mm   FS 31 28 - 44 %   LA vol 58.2 mL   LA ID, A-P, ES 41 mm   IVS/LV PW RATIO, ED .59    LV e' LATERAL 10.1 cm/s   LV E/e' medial 8.66    LV E/e'average 8.66    LA diam index 1.45 cm/m2   LA vol A4C 54.2 ml   Area-P 1/2 3.79 cm2   E decel time 197 msec   Peak grad 3 mmHg   E/e' ratio 8.66    MV pk E vel 87.5 m/s   P 1/2 time 58 ms   MV pk A vel 105 m/s   LA vol index 20.6 mL/m2   MV Dec 197    LA diam end sys 41.00 mm   TDI e' medial 5.98    TDI e' lateral 10.10    Lateral S' vel 15.30 cm/sec   TAPSE 41.20 mm      PHQ2/9: Depression screen [facility] 2/9 06/22/2016 12/20/2015  09/02/2015 03/04/2015 12/03/2014  Decreased Interest 0 0 0 0 0  Down, Depressed, Hopeless 0 0 0 0 0  PHQ - 2 Score 0 0 0 0 0    Fall Risk: Fall Risk  06/22/2016 05/03/2016 12/20/2015 09/02/2015 03/04/2015  Falls in the past year? _0     Assessment & Plan  1. Hospital discharge follow-up  We will add PPI as recommended, refer her to cardiologist and give her meclizine. Keep follow up with nephrologist, ENT and endocrinologist   2. Lightheadedness  She already has an appointment with ENT next week, she never got Meclizine given by [facility] physician, we will call pharmacy  - Ambulatory referral to Cardiology  3. Goiter, toxic, multinodular  S/p ablation, but based on [facility] notes had one nodule that should be biopsied, she has follow up with Endocrinologist in September  4. History of radioactive iodine thyroid ablation  In 2015  5. Elevated  TSH  During last hospital stay, but it was normal 3 weeks prior, she stopped medication because it was causing her to feel jittery and anxious, we will recheck level in 6 weeks  6. 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 Other chest pain  Cardiologist recommended ppi trial  - Ambulatory referral to Cardiology - omeprazole (PRILOSEC) 40 MG capsule; Take 1 capsule (40 mg total) by mouth daily.  Dispense: 30 capsule; Refill: 0   8. Decrease in appetite  And stomach is hard, no significant change in bowel movements, she is not sure how long symptoms has been present, advised to try Miralax otc to see if having a bowel movement would improve symptoms, and we will add PPI also

## 2016-07-10 NOTE — Addendum Note (Signed)
Addended by: Steele Sizer F on: 07/10/2016 04:46 PM   Modules accepted: Orders

## 2016-07-12 DIAGNOSIS — I10 Essential (primary) hypertension: Secondary | ICD-10-CM | POA: Diagnosis not present

## 2016-07-12 DIAGNOSIS — R809 Proteinuria, unspecified: Secondary | ICD-10-CM | POA: Diagnosis not present

## 2016-07-13 ENCOUNTER — Other Ambulatory Visit (INDEPENDENT_AMBULATORY_CARE_PROVIDER_SITE_OTHER): Payer: Self-pay | Admitting: Nephrology

## 2016-07-13 DIAGNOSIS — I10 Essential (primary) hypertension: Secondary | ICD-10-CM

## 2016-07-16 ENCOUNTER — Other Ambulatory Visit (INDEPENDENT_AMBULATORY_CARE_PROVIDER_SITE_OTHER): Payer: Self-pay | Admitting: Nephrology

## 2016-07-16 ENCOUNTER — Ambulatory Visit (INDEPENDENT_AMBULATORY_CARE_PROVIDER_SITE_OTHER): Payer: 59

## 2016-07-16 DIAGNOSIS — I10 Essential (primary) hypertension: Secondary | ICD-10-CM | POA: Diagnosis not present

## 2016-07-17 ENCOUNTER — Encounter: Payer: Self-pay | Admitting: *Deleted

## 2016-07-23 ENCOUNTER — Telehealth: Payer: Self-pay

## 2016-07-23 ENCOUNTER — Ambulatory Visit (INDEPENDENT_AMBULATORY_CARE_PROVIDER_SITE_OTHER): Payer: 59 | Admitting: Family Medicine

## 2016-07-23 ENCOUNTER — Encounter: Payer: Self-pay | Admitting: Family Medicine

## 2016-07-23 VITALS — BP 138/86 | HR 79 | Temp 98.4°F | Resp 16 | Ht 67.0 in | Wt 345.6 lb

## 2016-07-23 DIAGNOSIS — R42 Dizziness and giddiness: Secondary | ICD-10-CM

## 2016-07-23 DIAGNOSIS — F450 Somatization disorder: Secondary | ICD-10-CM

## 2016-07-23 DIAGNOSIS — I10 Essential (primary) hypertension: Secondary | ICD-10-CM

## 2016-07-23 DIAGNOSIS — R946 Abnormal results of thyroid function studies: Secondary | ICD-10-CM | POA: Diagnosis not present

## 2016-07-23 DIAGNOSIS — R7989 Other specified abnormal findings of blood chemistry: Secondary | ICD-10-CM

## 2016-07-23 MED ORDER — LISINOPRIL 10 MG PO TABS: 10.0000 mg | ORAL_TABLET | Freq: Two times a day (BID) | ORAL | 0 refills | Status: DC

## 2016-07-23 NOTE — Progress Notes (Signed)
Name: Madeline Dominguez   MRN: [id number]    DOB: [birth year]   Date:07/23/2016       Progress Note  Subjective  Chief Complaint  Chief Complaint  Patient presents with  . Follow-up    1 month F/U  . Hypertension    Seen Nephrologist on the 16th and awaiting for [place] of Kidney before changing medication first- follow up sometime this week. Dizziness has decreased since taking medication in the evening.    HPI  HTN: feeling better, no visits to [facility] lately, dizziness resolved, appetite is back to normal, she has gained weight since last visit, but not working for the past 4 weeks. She was seen by Dr. Holley Raring and renal artery [place] was normal, she is only taking lisinopril 10 mg around 5 pm and bp is usually around 130's, however at times it goes to 150 in am's. She states she has been wearing CPAP machine every night. She has been off Omeprazole and Meclizine. She feels ready to go back to work. We will give her 60 pills of lisinopril to take one in am if bp above 140/90 when she wakes up.    Patient Active Problem List   Diagnosis Date Noted  . 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 History of radioactive iodine thyroid ablation   . Chest pain 07/01/2016  . 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 Right leg DVT ([place]) 08/01/2015  . Iron deficiency anemia 07/27/2015  . B12 deficiency 07/27/2015  . Bilateral pulmonary embolism ([place]) 07/25/2015  . Adrenal nodule ([place]) 07/25/2015  . Pulmonary nodules/lesions, multiple 07/25/2015  . OSA (obstructive sleep apnea) 05/18/2015  . Thyroid nodule 12/17/2014  . Heavy menstrual period 09/02/2014  . Gastroesophageal reflux disease without esophagitis 09/02/2014  . Extreme obesity 06/22/2014  . Large breasts 06/22/2014  . Anemia, iron deficiency 06/22/2014  . H/O hyperthyroidism 06/22/2014  . Right ventricular dilation 06/22/2014  . 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 History of toxic multinodular  goiter 03/06/2013    Past Surgical History:  Procedure Laterality Date  . CESAREAN SECTION      Family History  Problem Relation Age of Onset  . CVA Father     Social History   Social History  . Marital status: Married    Spouse name: N/A  . Number of children: N/A  . Years of education: N/A   Occupational History  . Not on file.   Social History Main Topics  . Smoking status: Never Smoker  . Smokeless tobacco: Never Used  . Alcohol use No     Comment: rarely  . Drug use: No  . Sexual activity: Yes    Partners: Male    Birth control/ protection: Condom   Other Topics Concern  . Not on file   Social History Narrative   Lives at home with husband.     Current Outpatient Prescriptions:  .  ferrous sulfate 325 (65 FE) MG tablet, Take 1 tablet (325 mg total) by mouth 2 (two) times daily with a meal. (Patient taking differently: Take 325 mg by mouth daily with breakfast. ), Disp: 180 tablet, Rfl: 1 .  lisinopril (PRINIVIL,ZESTRIL) 10 MG tablet, Take 1 tablet (10 mg total) by mouth 2 (two) times daily., Disp: 60 tablet, Rfl: 0 .  vitamin B-12 1000 MCG tablet, Take 1 tablet (1,000 mcg total) by mouth daily., Disp: 30 tablet, Rfl: 0 .  cyclobenzaprine (FLEXERIL) 10 MG tablet, Take 10 mg by mouth 3 (three)  times daily as needed for muscle spasms. , Disp: , Rfl:   Allergies  Allergen Reactions  . Aspirin Nausea And Vomiting  . Hctz [Hydrochlorothiazide] Other (See Comments)    Nausea, cramping     ROS  Ten systems reviewed and is negative except as mentioned in HPI   Objective  Vitals:   07/23/16 1023  BP: 138/86  Pulse: 79  Resp: 16  Temp: 98.4 F (36.9 C)  TempSrc: Oral  SpO2: 95%  Weight: (!) 345 lb 9.6 oz (156.8 kg)  Height: 5' 7"  (1.702 m)    Body mass index is 54.13 kg/m.  Physical Exam  Constitutional: Patient appears well-developed and well-nourished. Obese  No distress.  HEENT: head atraumatic, normocephalic, pupils equal and reactive to  light,  neck supple, throat within normal limits Cardiovascular: Normal rate, regular rhythm and normal heart sounds.  No murmur heard. No BLE edema. Pulmonary/Chest: Effort normal and breath sounds normal. No respiratory distress. Abdominal: Soft.  There is no tenderness. Psychiatric: Patient has a normal mood and affect. behavior is normal. Judgment and thought content normal.  Recent Results (from the past 2160 hour(s))  COMPLETE METABOLIC PANEL WITH GFR     Status: Abnormal   Collection Time: 04/27/16 11:29 AM  Result Value Ref Range   Sodium 141 135 - 146 mmol/L   Potassium 4.2 3.5 - 5.3 mmol/L   Chloride 105 98 - 110 mmol/L   CO2 31 20 - 31 mmol/L   Glucose, Bld 104 (H) 65 - 99 mg/dL   BUN 9 7 - 25 mg/dL   Creat 0.68 0.50 - 1.10 mg/dL   Total Bilirubin 0.2 0.2 - 1.2 mg/dL   Alkaline Phosphatase 64 33 - 115 U/L   AST 11 10 - 35 U/L   ALT 10 6 - 29 U/L   Total Protein 6.6 6.1 - 8.1 g/dL   Albumin 3.5 (L) 3.6 - 5.1 g/dL   Calcium 8.6 8.6 - 10.2 mg/dL   GFR, Est African American >89 >=60 mL/min   GFR, Est Non African American >89 >=60 mL/min  Lipid panel     Status: None   Collection Time: 04/27/16 11:29 AM  Result Value Ref Range   Cholesterol 178 <200 mg/dL   Triglycerides 53 <150 mg/dL   HDL 68 >50 mg/dL   Total CHOL/HDL Ratio 2.6 <5.0 Ratio   VLDL 11 <30 mg/dL   LDL Cholesterol 99 <100 mg/dL  Hemoglobin A1c     Status: None   Collection Time: 04/27/16 11:29 AM  Result Value Ref Range   Hgb A1c MFr Bld 4.9 <5.7 %    Comment:   For the purpose of screening for the presence of diabetes:   <5.7%       Consistent with the absence of diabetes 5.7-6.4 %   Consistent with increased risk for diabetes (prediabetes) >=6.5 %     Consistent with diabetes   This assay result is consistent with a decreased risk of diabetes.   Currently, no consensus exists regarding use of hemoglobin A1c for diagnosis of diabetes in children.   According to American Diabetes Association  (ADA) guidelines, hemoglobin A1c <7.0% represents optimal control in non-pregnant diabetic patients. Different metrics may apply to specific patient populations. Standards of Medical Care in Diabetes (ADA).      Mean Plasma Glucose 94 mg/dL  Insulin, fasting     Status: None   Collection Time: 04/27/16 11:29 AM  Result Value Ref Range   Insulin fasting, serum 10.3  2.0 - 19.6 uIU/mL    Comment:   This insulin assay shows strong cross-reactivity for some insulin analogs (lispro, aspart, and glargine) and much lower cross-reactivity with others (detemir, glulisine).   Stimulated Insulin reference intervals were established using the Siemens Immulite assay. These values are provided for general guidance only.   CBC with Differential     Status: Abnormal   Collection Time: 05/03/16 10:40 AM  Result Value Ref Range   WBC 7.0 3.6 - 11.0 K/uL   RBC 3.19 (L) 3.80 - 5.20 MIL/uL   Hemoglobin 8.3 (L) 12.0 - 16.0 g/dL   HCT 25.9 (L) 35.0 - 47.0 %   MCV 81.3 80.0 - 100.0 fL   MCH 25.9 (L) 26.0 - 34.0 pg   MCHC 31.9 (L) 32.0 - 36.0 g/dL   RDW 18.3 (H) 11.5 - 14.5 %   Platelets 335 150 - 440 K/uL   Neutrophils Relative % 53 %   Neutro Abs 3.8 1.4 - 6.5 K/uL   Lymphocytes Relative 35 %   Lymphs Abs 2.4 1.0 - 3.6 K/uL   Monocytes Relative 9 %   Monocytes Absolute 0.6 0.2 - 0.9 K/uL   Eosinophils Relative 3 %   Eosinophils Absolute 0.2 0 - 0.7 K/uL   Basophils Relative 0 %   Basophils Absolute 0.0 0 - 0.1 K/uL  Ferritin     Status: Abnormal   Collection Time: 05/03/16 10:40 AM  Result Value Ref Range   Ferritin 7 (L) 11 - 307 ng/mL  Iron and TIBC     Status: Abnormal   Collection Time: 05/03/16 10:40 AM  Result Value Ref Range   Iron 12 (L) 28 - 170 ug/dL   TIBC 312 250 - 450 ug/dL   Saturation Ratios 4 (L) 10.4 - 31.8 %   UIBC 300 ug/dL  CBC with Differential/Platelet     Status: Abnormal   Collection Time: 05/18/16  4:49 PM  Result Value Ref Range   WBC 6.9 3.6 - 11.0 K/uL    RBC 3.91 3.80 - 5.20 MIL/uL   Hemoglobin 10.9 (L) 12.0 - 16.0 g/dL   HCT 33.6 (L) 35.0 - 47.0 %   MCV 86.1 80.0 - 100.0 fL   MCH 27.9 26.0 - 34.0 pg   MCHC 32.4 32.0 - 36.0 g/dL   RDW 22.5 (H) 11.5 - 14.5 %   Platelets 256 150 - 440 K/uL   Neutrophils Relative % 64 %   Neutro Abs 4.5 1.4 - 6.5 K/uL   Lymphocytes Relative 25 %   Lymphs Abs 1.7 1.0 - 3.6 K/uL   Monocytes Relative 7 %   Monocytes Absolute 0.5 0.2 - 0.9 K/uL   Eosinophils Relative 3 %   Eosinophils Absolute 0.2 0 - 0.7 K/uL   Basophils Relative 1 %   Basophils Absolute 0.0 0 - 0.1 K/uL  Comprehensive metabolic panel     Status: Abnormal   Collection Time: 05/18/16  4:49 PM  Result Value Ref Range   Sodium 142 135 - 145 mmol/L   Potassium 3.8 3.5 - 5.1 mmol/L   Chloride 107 101 - 111 mmol/L   CO2 30 22 - 32 mmol/L   Glucose, Bld 99 65 - 99 mg/dL   BUN 13 6 - 20 mg/dL   Creatinine, Ser 0.73 0.44 - 1.00 mg/dL   Calcium 9.2 8.9 - 10.3 mg/dL   Total Protein 7.8 6.5 - 8.1 g/dL   Albumin 3.6 3.5 - 5.0 g/dL   AST 12 (L) 15 -  41 U/L   ALT 10 (L) 14 - 54 U/L   Alkaline Phosphatase 77 38 - 126 U/L   Total Bilirubin 0.3 0.3 - 1.2 mg/dL   GFR calc non Af Amer >60 >60 mL/min   GFR calc Af Amer >60 >60 mL/min    Comment: (NOTE) The eGFR has been calculated using the CKD EPI equation. This calculation has not been validated in all clinical situations. eGFR's persistently <60 mL/min signify possible Chronic Kidney Disease.    Anion gap 5 5 - 15  Troponin I     Status: None   Collection Time: 05/18/16  4:49 PM  Result Value Ref Range   Troponin I <0.03 <0.03 ng/mL  Brain natriuretic peptide     Status: None   Collection Time: 05/18/16  4:49 PM  Result Value Ref Range   B Natriuretic Peptide 64.0 0.0 - 100.0 pg/mL  COMPLETE METABOLIC PANEL WITH GFR     Status: Abnormal   Collection Time: 06/13/16 12:31 PM  Result Value Ref Range   Sodium 140 135 - 146 mmol/L   Potassium 3.9 3.5 - 5.3 mmol/L   Chloride 101 98 - 110  mmol/L   CO2 32 (H) 20 - 31 mmol/L   Glucose, Bld 106 (H) 65 - 99 mg/dL   BUN 9 7 - 25 mg/dL   Creat 0.69 0.50 - 1.10 mg/dL   Total Bilirubin 0.4 0.2 - 1.2 mg/dL   Alkaline Phosphatase 78 33 - 115 U/L   AST 8 (L) 10 - 35 U/L   ALT 8 6 - 29 U/L   Total Protein 6.8 6.1 - 8.1 g/dL   Albumin 3.6 3.6 - 5.1 g/dL   Calcium 9.5 8.6 - 10.2 mg/dL   GFR, Est African American >89 >=60 mL/min   GFR, Est Non African American >89 >=60 mL/min  TSH     Status: None   Collection Time: 06/13/16 12:31 PM  Result Value Ref Range   TSH 3.12 mIU/L    Comment:   Reference Range   > or = 20 Years  0.40-4.50   Pregnancy Range First trimester  0.26-2.66 Second trimester 0.55-2.73 Third trimester  0.43-2.91     Basic metabolic panel     Status: Abnormal   Collection Time: 06/13/16  1:02 PM  Result Value Ref Range   Sodium 137 135 - 145 mmol/L   Potassium 3.4 (L) 3.5 - 5.1 mmol/L   Chloride 100 (L) 101 - 111 mmol/L   CO2 30 22 - 32 mmol/L   Glucose, Bld 110 (H) 65 - 99 mg/dL   BUN 9 6 - 20 mg/dL   Creatinine, Ser 0.54 0.44 - 1.00 mg/dL   Calcium 9.5 8.9 - 10.3 mg/dL   GFR calc non Af Amer >60 >60 mL/min   GFR calc Af Amer >60 >60 mL/min    Comment: (NOTE) The eGFR has been calculated using the CKD EPI equation. This calculation has not been validated in all clinical situations. eGFR's persistently <60 mL/min signify possible Chronic Kidney Disease.    Anion gap 7 5 - 15  CBC     Status: Abnormal   Collection Time: 06/13/16  1:02 PM  Result Value Ref Range   WBC 5.3 3.6 - 11.0 K/uL   RBC 4.22 3.80 - 5.20 MIL/uL   Hemoglobin 12.0 12.0 - 16.0 g/dL   HCT 36.3 35.0 - 47.0 %   MCV 86.0 80.0 - 100.0 fL   MCH 28.4 26.0 - 34.0 pg  MCHC 33.0 32.0 - 36.0 g/dL   RDW 19.8 (H) 11.5 - 14.5 %   Platelets 253 150 - 440 K/uL  Troponin I     Status: None   Collection Time: 06/13/16  1:02 PM  Result Value Ref Range   Troponin I <0.03 <0.03 ng/mL  TSH     Status: None   Collection Time: 06/13/16   1:02 PM  Result Value Ref Range   TSH 3.895 0.350 - 4.500 uIU/mL    Comment: Performed by a 3rd Generation assay with a functional sensitivity of <=0.01 uIU/mL.  T4, free     Status: None   Collection Time: 06/13/16  1:02 PM  Result Value Ref Range   Free T4 0.77 0.61 - 1.12 ng/dL    Comment: (NOTE) Biotin ingestion may interfere with free T4 tests. If the results are inconsistent with the TSH level, previous test results, or the clinical presentation, then consider biotin interference. If needed, order repeat testing after stopping biotin.   hCG, quantitative, pregnancy     Status: None   Collection Time: 06/13/16  1:02 PM  Result Value Ref Range   hCG, Beta Chain, Quant, S 1 <5 mIU/mL    Comment:          GEST. AGE      CONC.  (mIU/mL)   <=1 WEEK        5 - 50     2 WEEKS       50 - 500     3 WEEKS       100 - 10,000     4 WEEKS     1,000 - 30,000     5 WEEKS     3,500 - 115,000   6-8 WEEKS     12,000 - 270,000    12 WEEKS     15,000 - 220,000        FEMALE AND NON-PREGNANT FEMALE:     LESS THAN 5 mIU/mL   Glucose, capillary     Status: Abnormal   Collection Time: 06/13/16  1:40 PM  Result Value Ref Range   Glucose-Capillary 106 (H) 65 - 99 mg/dL  Urinalysis, Complete w Microscopic     Status: Abnormal   Collection Time: 06/13/16  4:27 PM  Result Value Ref Range   Color, Urine YELLOW (A) YELLOW   APPearance HAZY (A) CLEAR   Specific Gravity, Urine 1.013 1.005 - 1.030   pH 6.0 5.0 - 8.0   Glucose, UA NEGATIVE NEGATIVE mg/dL   Hgb urine dipstick MODERATE (A) NEGATIVE   Bilirubin Urine NEGATIVE NEGATIVE   Ketones, ur NEGATIVE NEGATIVE mg/dL   Protein, ur NEGATIVE NEGATIVE mg/dL   Nitrite POSITIVE (A) NEGATIVE   Leukocytes, UA TRACE (A) NEGATIVE   RBC / HPF 0-5 0 - 5 RBC/hpf   WBC, UA 0-5 0 - 5 WBC/hpf   Bacteria, UA MANY (A) NONE SEEN   Squamous Epithelial / LPF 0-5 (A) NONE SEEN   Mucous PRESENT   Troponin I     Status: None   Collection Time: 06/13/16  4:27 PM   Result Value Ref Range   Troponin I <0.03 <0.03 ng/mL  Basic metabolic panel     Status: Abnormal   Collection Time: 06/18/16  9:36 AM  Result Value Ref Range   Sodium 136 135 - 145 mmol/L   Potassium 3.4 (L) 3.5 - 5.1 mmol/L   Chloride 100 (L) 101 - 111 mmol/L   CO2 30 22 -  32 mmol/L   Glucose, Bld 114 (H) 65 - 99 mg/dL   BUN 13 6 - 20 mg/dL   Creatinine, Ser 0.86 0.44 - 1.00 mg/dL   Calcium 9.2 8.9 - 10.3 mg/dL   GFR calc non Af Amer >60 >60 mL/min   GFR calc Af Amer >60 >60 mL/min    Comment: (NOTE) The eGFR has been calculated using the CKD EPI equation. This calculation has not been validated in all clinical situations. eGFR's persistently <60 mL/min signify possible Chronic Kidney Disease.    Anion gap 6 5 - 15  CBC     Status: Abnormal   Collection Time: 06/18/16  9:36 AM  Result Value Ref Range   WBC 5.5 3.6 - 11.0 K/uL   RBC 4.46 3.80 - 5.20 MIL/uL   Hemoglobin 12.5 12.0 - 16.0 g/dL   HCT 38.1 35.0 - 47.0 %   MCV 85.4 80.0 - 100.0 fL   MCH 28.0 26.0 - 34.0 pg   MCHC 32.8 32.0 - 36.0 g/dL   RDW 19.4 (H) 11.5 - 14.5 %   Platelets 247 150 - 440 K/uL  Troponin I     Status: None   Collection Time: 06/18/16  9:56 AM  Result Value Ref Range   Troponin I <0.03 <0.03 ng/mL  APTT     Status: None   Collection Time: 06/18/16  9:56 AM  Result Value Ref Range   aPTT 27 24 - 36 seconds  Urinalysis, Complete w Microscopic     Status: Abnormal   Collection Time: 06/18/16  2:20 PM  Result Value Ref Range   Color, Urine YELLOW (A) YELLOW   APPearance HAZY (A) CLEAR   Specific Gravity, Urine 1.012 1.005 - 1.030   pH 6.0 5.0 - 8.0   Glucose, UA NEGATIVE NEGATIVE mg/dL   Hgb urine dipstick MODERATE (A) NEGATIVE   Bilirubin Urine NEGATIVE NEGATIVE   Ketones, ur NEGATIVE NEGATIVE mg/dL   Protein, ur NEGATIVE NEGATIVE mg/dL   Nitrite NEGATIVE NEGATIVE   Leukocytes, UA SMALL (A) NEGATIVE   RBC / HPF 0-5 0 - 5 RBC/hpf   WBC, UA 6-30 0 - 5 WBC/hpf   Bacteria, UA RARE (A)  NONE SEEN   Squamous Epithelial / LPF 6-30 (A) NONE SEEN   Mucous PRESENT   Urine culture     Status: Abnormal   Collection Time: 06/18/16  2:20 PM  Result Value Ref Range   Specimen Description URINE, RANDOM    Special Requests NONE    Culture MULTIPLE SPECIES PRESENT, SUGGEST RECOLLECTION (A)    Report Status 06/20/2016 FINAL   Fibrin derivatives D-Dimer ([facility] only)     Status: None   Collection Time: 06/20/16  1:42 PM  Result Value Ref Range   Fibrin derivatives D-dimer ([facility]) 386.70 0.00 - 499.00    Comment: (NOTE) <> Exclusion of Venous Thromboembolism (VTE) - OUTPATIENT ONLY   (Emergency Department or [place])   0-499 ng/ml (FEU): With a low to intermediate pretest probability                      for VTE this test result excludes the diagnosis                      of VTE.   >499 ng/ml (FEU) : VTE not excluded; additional work up for VTE is  required. <> Testing on Inpatients and Evaluation of Disseminated Intravascular   Coagulation ([facility]) Reference Range:   0-499 ng/ml (FEU)   Basic metabolic panel     Status: None   Collection Time: 06/30/16  7:48 PM  Result Value Ref Range   Sodium 139 135 - 145 mmol/L   Potassium 3.6 3.5 - 5.1 mmol/L   Chloride 104 101 - 111 mmol/L   CO2 26 22 - 32 mmol/L   Glucose, Bld 99 65 - 99 mg/dL   BUN 10 6 - 20 mg/dL   Creatinine, Ser 0.78 0.44 - 1.00 mg/dL   Calcium 9.2 8.9 - 10.3 mg/dL   GFR calc non Af Amer >60 >60 mL/min   GFR calc Af Amer >60 >60 mL/min    Comment: (NOTE) The eGFR has been calculated using the CKD EPI equation. This calculation has not been validated in all clinical situations. eGFR's persistently <60 mL/min signify possible Chronic Kidney Disease.    Anion gap 9 5 - 15  CBC     Status: Abnormal   Collection Time: 06/30/16  7:48 PM  Result Value Ref Range   WBC 6.0 3.6 - 11.0 K/uL   RBC 4.15 3.80 - 5.20 MIL/uL   Hemoglobin 12.1 12.0 - 16.0 g/dL   HCT 36.3 35.0 - 47.0 %   MCV 87.4 80.0 -  100.0 fL   MCH 29.1 26.0 - 34.0 pg   MCHC 33.4 32.0 - 36.0 g/dL   RDW 18.6 (H) 11.5 - 14.5 %   Platelets 227 150 - 440 K/uL  Troponin I     Status: None   Collection Time: 06/30/16  7:48 PM  Result Value Ref Range   Troponin I <0.03 <0.03 ng/mL  Urinalysis, Complete w Microscopic     Status: Abnormal   Collection Time: 07/01/16 12:00 AM  Result Value Ref Range   Color, Urine YELLOW (A) YELLOW   APPearance HAZY (A) CLEAR   Specific Gravity, Urine 1.024 1.005 - 1.030   pH 5.0 5.0 - 8.0   Glucose, UA NEGATIVE NEGATIVE mg/dL   Hgb urine dipstick MODERATE (A) NEGATIVE   Bilirubin Urine NEGATIVE NEGATIVE   Ketones, ur 20 (A) NEGATIVE mg/dL   Protein, ur NEGATIVE NEGATIVE mg/dL   Nitrite NEGATIVE NEGATIVE   Leukocytes, UA TRACE (A) NEGATIVE   RBC / HPF 0-5 0 - 5 RBC/hpf   WBC, UA 6-30 0 - 5 WBC/hpf   Bacteria, UA RARE (A) NONE SEEN   Squamous Epithelial / LPF 6-30 (A) NONE SEEN   Mucous PRESENT   Urine Culture     Status: Abnormal   Collection Time: 07/01/16 12:00 AM  Result Value Ref Range   Specimen Description URINE, RANDOM    Special Requests Normal    Culture MULTIPLE SPECIES PRESENT, SUGGEST RECOLLECTION (A)    Report Status 07/02/2016 FINAL   TSH     Status: Abnormal   Collection Time: 07/01/16  3:50 AM  Result Value Ref Range   TSH 6.699 (H) 0.350 - 4.500 uIU/mL    Comment: Performed by a 3rd Generation assay with a functional sensitivity of <=0.01 uIU/mL.  Troponin I     Status: None   Collection Time: 07/01/16  3:50 AM  Result Value Ref Range   Troponin I <0.03 <0.03 ng/mL  Troponin I     Status: None   Collection Time: 07/01/16 10:08 AM  Result Value Ref Range   Troponin I <0.03 <0.03 ng/mL  Troponin I  Status: None   Collection Time: 07/01/16  4:19 PM  Result Value Ref Range   Troponin I <0.03 <0.03 ng/mL  ECHOCARDIOGRAM COMPLETE     Status: Abnormal   Collection Time: 07/02/16  8:06 PM  Result Value Ref Range   Weight 5,580.8 oz   Height 67 in   BP  146/84 mmHg   LV PW d 15.4 (A) 0.6 - 1.1 mm   FS 31 28 - 44 %   LA vol 58.2 mL   LA ID, A-P, ES 41 mm   IVS/LV PW RATIO, ED .59    LV e' LATERAL 10.1 cm/s   LV E/e' medial 8.66    LV E/e'average 8.66    LA diam index 1.45 cm/m2   LA vol A4C 54.2 ml   Area-P 1/2 3.79 cm2   E decel time 197 msec   Peak grad 3 mmHg   E/e' ratio 8.66    MV pk E vel 87.5 m/s   P 1/2 time 58 ms   MV pk A vel 105 m/s   LA vol index 20.6 mL/m2   MV Dec 197    LA diam end sys 41.00 mm   TDI e' medial 5.98    TDI e' lateral 10.10    Lateral S' vel 15.30 cm/sec   TAPSE 41.20 mm      PHQ2/9: Depression screen [facility] 2/9 06/22/2016 12/20/2015 09/02/2015 03/04/2015 12/03/2014  Decreased Interest 0 0 0 0 0  Down, Depressed, Hopeless 0 0 0 0 0  PHQ - 2 Score 0 0 0 0 0     Fall Risk: Fall Risk  06/22/2016 05/03/2016 12/20/2015 09/02/2015 03/04/2015  Falls in the past year? No No No No No     Assessment & Plan  1. Lightheadedness  Resolved, may return to work, not restrictions  2. Benign hypertension  We will adjust dose of medication  - lisinopril (PRINIVIL,ZESTRIL) 10 MG tablet; Take 1 tablet (10 mg total) by mouth 2 (two) times daily.  Dispense: 60 tablet; Refill: 0  3. Elevated TSH  - TSH in 3 weeks  4. Morbid obesity, unspecified obesity type ([place])  Gaining weight since off work, TSH was also elevated, we will recheck in 3 more weeks and start medication if needed. Discussed importance of life style modification. Drink water and avoid sweet beverages

## 2016-07-23 NOTE — Telephone Encounter (Signed)
Patient states while in the office her BP was normal but once she drove home she start getting dizzy. She checked her BP and it was 183/108 and wanted advice on what to do. She was approved to go back to work today and she works third shift.

## 2016-07-24 ENCOUNTER — Emergency Department: Payer: Commercial Managed Care - HMO

## 2016-07-24 ENCOUNTER — Emergency Department
Admission: EM | Admit: 2016-07-24 | Discharge: 2016-07-24 | Disposition: A | Discharge status: Home / Self Care | Payer: Commercial Managed Care - HMO | Attending: Emergency Medicine | Admitting: Emergency Medicine

## 2016-07-24 ENCOUNTER — Encounter: Payer: Self-pay | Admitting: Emergency Medicine

## 2016-07-24 DIAGNOSIS — E039 Hypothyroidism, unspecified: Secondary | ICD-10-CM | POA: Insufficient documentation

## 2016-07-24 DIAGNOSIS — R531 Weakness: Secondary | ICD-10-CM | POA: Insufficient documentation

## 2016-07-24 DIAGNOSIS — Z79899 Other long term (current) drug therapy: Secondary | ICD-10-CM | POA: Insufficient documentation

## 2016-07-24 DIAGNOSIS — I10 Essential (primary) hypertension: Secondary | ICD-10-CM | POA: Insufficient documentation

## 2016-07-24 DIAGNOSIS — R51 Headache: Secondary | ICD-10-CM | POA: Diagnosis present

## 2016-07-24 LAB — CBC
HCT: 34.9 % — ABNORMAL LOW (ref 35.0–47.0)
Hemoglobin: 11.7 g/dL — ABNORMAL LOW (ref 12.0–16.0)
MCH: 29.6 pg (ref 26.0–34.0)
MCHC: 33.6 g/dL (ref 32.0–36.0)
MCV: 88.2 fL (ref 80.0–100.0)
Platelets: 252 10*3/uL (ref 150–440)
RBC: 3.96 MIL/uL (ref 3.80–5.20)
RDW: 17.3 % — AB (ref 11.5–14.5)
WBC: 5.7 10*3/uL (ref 3.6–11.0)

## 2016-07-24 LAB — COMPREHENSIVE METABOLIC PANEL
ALBUMIN: 3.5 g/dL (ref 3.5–5.0)
ALT: 11 U/L — ABNORMAL LOW (ref 14–54)
ANION GAP: 8 (ref 5–15)
AST: 13 U/L — ABNORMAL LOW (ref 15–41)
Alkaline Phosphatase: 76 U/L (ref 38–126)
BILIRUBIN TOTAL: 0.8 mg/dL (ref 0.3–1.2)
BUN: 9 mg/dL (ref 6–20)
CALCIUM: 9.4 mg/dL (ref 8.9–10.3)
CO2: 27 mmol/L (ref 22–32)
Chloride: 104 mmol/L (ref 101–111)
Creatinine, Ser: 0.69 mg/dL (ref 0.44–1.00)
GFR calc non Af Amer: >60 mL/min (ref >60)
GLUCOSE: 115 mg/dL — AB (ref 65–99)
POTASSIUM: 3.6 mmol/L (ref 3.5–5.1)
SODIUM: 139 mmol/L (ref 135–145)
TOTAL PROTEIN: 7.6 g/dL (ref 6.5–8.1)

## 2016-07-24 LAB — TSH: TSH: 2.789 u[IU]/mL (ref 0.350–4.500)

## 2016-07-24 LAB — TROPONIN I

## 2016-07-24 MED ORDER — SODIUM CHLORIDE 0.9 % IV BOLUS (SEPSIS)
250.0000 mL | Freq: Once | INTRAVENOUS | Status: AC
  Administered 2016-07-24: 250 mL via INTRAVENOUS

## 2016-07-24 NOTE — ED Notes (Signed)
Pt verbalizes d/c teaching and paperwork, dneis any further questions. Pt in NAD at this time, wheelchair to lobby

## 2016-07-24 NOTE — Telephone Encounter (Signed)
She went back to [facility], not sure if bp is spiking secondary to work stress, she may need to see psychiatrist.

## 2016-07-24 NOTE — ED Notes (Signed)
ED Provider at bedside. 

## 2016-07-24 NOTE — ED Notes (Signed)
Family at bedside. 

## 2016-07-24 NOTE — ED Provider Notes (Signed)
[facility] Emergency Department Provider Note   ____________________________________________    I have reviewed the triage vital signs and the nursing notes.   HISTORY  Chief Complaint Headache     HPI Madeline Dominguez is a 51 y.o. female who presents with multiple medical complaints. Patient reports she has had intermittent mild global headaches over the last 2 days. She reports she has felt very weak and with low energy as well. She reports her blood pressure has been fluctuating. She denies chest pain or short of breath. No fevers or chills. No dysuria or abdominal pain. No nausea or vomiting. She denies depression.   Past Medical History:  Diagnosis Date  . Cough 07/25/2015  . Dilated cardiomyopathy ([place])   . H/O: hypothyroidism   . Heart palpitations   . Hypertension   . Iron deficiency   . Large breasts   . Morbid obesity ([place])   . Postpartum hemorrhage   . Thyroid disease     Patient Active Problem List   Diagnosis Date Noted  . 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 History of radioactive iodine thyroid ablation   . Chest pain 07/01/2016  . 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 Right leg DVT ([place]) 08/01/2015  . Iron deficiency anemia 07/27/2015  . B12 deficiency 07/27/2015  . Bilateral pulmonary embolism ([place]) 07/25/2015  . Adrenal nodule ([place]) 07/25/2015  . Pulmonary nodules/lesions, multiple 07/25/2015  . OSA (obstructive sleep apnea) 05/18/2015  . Thyroid nodule 12/17/2014  . Heavy menstrual period 09/02/2014  . Gastroesophageal reflux disease without esophagitis 09/02/2014  . Extreme obesity 06/22/2014  . Large breasts 06/22/2014  . Anemia, iron deficiency 06/22/2014  . H/O hyperthyroidism 06/22/2014  . Right ventricular dilation 06/22/2014  . 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    Past Surgical History:    Procedure Laterality Date  . CESAREAN SECTION      Prior to Admission medications   Medication Sig Start Date End Date Taking? Authorizing Provider  cyclobenzaprine (FLEXERIL) 10 MG tablet Take 10 mg by mouth 3 (three) times daily as needed for muscle spasms.  06/19/16   [provider]  ferrous sulfate 325 (65 FE) MG tablet Take 1 tablet (325 mg total) by mouth 2 (two) times daily with a meal. Patient taking differently: Take 325 mg by mouth daily with breakfast.  09/02/14   Steele Sizer, MD  lisinopril (PRINIVIL,ZESTRIL) 10 MG tablet Take 1 tablet (10 mg total) by mouth 2 (two) times daily. 07/23/16   Steele Sizer, MD  vitamin B-12 1000 MCG tablet Take 1 tablet (1,000 mcg total) by mouth daily. 07/27/15   Loletha Grayer, MD     Allergies Aspirin and Hctz [hydrochlorothiazide]  Family History  Problem Relation Age of Onset  . CVA Father     Social History Social History  Substance Use Topics  . Smoking status: Never Smoker  . Smokeless tobacco: Never Used  . Alcohol use No     Comment: rarely    Review of Systems  Constitutional: No fever/chills Eyes: No visual changes.  ENT: No sore throat. Cardiovascular: Denies chest pain. Respiratory: Denies shortness of breath. Gastrointestinal: No abdominal pain.   Genitourinary: Negative for dysuria. Musculoskeletal: Negative for back pain. Skin: Negative for rash. Neurological: Negative for focal weakness   ____________________________________________   PHYSICAL EXAM:  VITAL SIGNS: ED Triage Vitals  Enc Vitals Group     BP 07/24/16 1104 (!)  161/103     Pulse Rate 07/24/16 1104 72     Resp 07/24/16 1104 18     Temp 07/24/16 1104 99.1 F (37.3 C)     Temp Source 07/24/16 1104 Oral     SpO2 07/24/16 1104 100 %     Weight 07/24/16 1104 (!) 155.6 kg (343 lb)     Height 07/24/16 1104 1.702 m (5\' 7" )     Head Circumference --      Peak Flow --      Pain Score 07/24/16 1103 8     Pain Loc --      Pain  Edu? --      Excl. in [place]? --     Constitutional: Alert and oriented. No acute distress.  Eyes: Conjunctivae are normal.   Nose: No congestion/rhinnorhea. Mouth/Throat: Mucous membranes are moist.   Cardiovascular: Normal rate, regular rhythm.  Good peripheral circulation. Respiratory: Normal respiratory effort.  No retractions. Gastrointestinal: Soft and nontender. No distention.  No CVA tenderness. Genitourinary: deferred Musculoskeletal: No lower extremity tenderness nor edema.  Warm and well perfused Neurologic:  Normal speech and language. No gross focal neurologic deficits are appreciated.  Skin:  Skin is warm, dry and intact. No rash noted. Psychiatric: patient with flat affect, depressed mood  ____________________________________________   LABS (all labs ordered are listed, but only abnormal results are displayed)  Labs Reviewed  CBC - Abnormal; Notable for the following:       Result Value   Hemoglobin 11.7 (*)    HCT 34.9 (*)    RDW 17.3 (*)    All other components within normal limits  COMPREHENSIVE METABOLIC PANEL - Abnormal; Notable for the following:    Glucose, Bld 115 (*)    AST 13 (*)    ALT 11 (*)    All other components within normal limits  TSH  TROPONIN I   ____________________________________________  EKG  None ____________________________________________  RADIOLOGY  Chest x-ray unremarkable ____________________________________________   PROCEDURES  Procedure(s) performed: No    Critical Care performed: No ____________________________________________   INITIAL IMPRESSION / ASSESSMENT AND PLAN / ED COURSE  Pertinent labs & imaging results that were available during my care of the patient were reviewed by me and considered in my medical decision making (see chart for details).  Patient presents with numerous vague complaints. Overall she is well-appearing and in no distress. However is very hard to get any history from her and she  does not appear to want to talk with me. I did ask whether she was expressing depressive symptoms and she denies this. Patient was treated with IV fluids, lab work was unremarkable. Chest x-ray benign. Do not see any indication for admission and discussed this with the patient, she is comfortable going home. She will return if any change in her symptoms    ____________________________________________   FINAL CLINICAL IMPRESSION(S) / ED DIAGNOSES  Final diagnoses:  Weakness  Essential hypertension      NEW MEDICATIONS STARTED DURING THIS VISIT:  Discharge Medication List as of 07/24/2016  2:41 PM       Note:  This document was prepared using Dragon voice recognition software and may include unintentional dictation errors.    Lavonia Drafts, MD 07/24/16 [phone]

## 2016-07-24 NOTE — ED Triage Notes (Signed)
Pt to ed with c/o headache and HTN.  Pt states she has no energy and "just feels bad"

## 2016-07-24 NOTE — ED Notes (Signed)
Pt refuses to open eyes when communicating. Pt appears to not want to answer questions.

## 2016-07-25 NOTE — Telephone Encounter (Signed)
Patient is coming back in Monday the 30th and is asking for a extention on FMLA.

## 2016-07-25 NOTE — Telephone Encounter (Signed)
She needs to see psychiatrist, bp was controlled when off work, but spiked again when she went back

## 2016-07-25 NOTE — Addendum Note (Signed)
Addended by: Steele Sizer F on: 07/25/2016 10:11 AM   Modules accepted: Orders

## 2016-07-26 ENCOUNTER — Telehealth: Payer: Self-pay | Admitting: Family Medicine

## 2016-07-26 NOTE — Telephone Encounter (Signed)
Pt would like a call back concerning her appt with Endocrinologist.

## 2016-07-27 DIAGNOSIS — R0683 Snoring: Secondary | ICD-10-CM | POA: Diagnosis not present

## 2016-07-27 DIAGNOSIS — G4733 Obstructive sleep apnea (adult) (pediatric): Secondary | ICD-10-CM | POA: Diagnosis not present

## 2016-07-27 NOTE — Telephone Encounter (Signed)
Patient states her Endocrinologist at [facility] is unable to see her until September and she feels like her Thyroid is cause of her troubles. I suggested she send them a message with her concerns to try to get in sooner or keep calling them. Or she could call around to see if she could get into another Endocrinologist office sooner.

## 2016-07-30 ENCOUNTER — Ambulatory Visit (INDEPENDENT_AMBULATORY_CARE_PROVIDER_SITE_OTHER): Payer: 59 | Admitting: Family Medicine

## 2016-07-30 ENCOUNTER — Encounter: Payer: Self-pay | Admitting: Family Medicine

## 2016-07-30 VITALS — BP 154/100 | HR 81 | Temp 97.9°F | Resp 16 | Ht 67.0 in | Wt 332.0 lb

## 2016-07-30 DIAGNOSIS — E0520 Thyrotoxicosis with toxic multinodular goiter without thyrotoxic crisis or storm: Secondary | ICD-10-CM

## 2016-07-30 DIAGNOSIS — R194 Change in bowel habit: Secondary | ICD-10-CM | POA: Diagnosis not present

## 2016-07-30 DIAGNOSIS — I10 Essential (primary) hypertension: Secondary | ICD-10-CM | POA: Diagnosis not present

## 2016-07-30 DIAGNOSIS — R634 Abnormal weight loss: Secondary | ICD-10-CM

## 2016-07-30 DIAGNOSIS — R1013 Epigastric pain: Secondary | ICD-10-CM

## 2016-07-30 DIAGNOSIS — R630 Anorexia: Secondary | ICD-10-CM

## 2016-07-30 DIAGNOSIS — R42 Dizziness and giddiness: Secondary | ICD-10-CM | POA: Diagnosis not present

## 2016-07-30 DIAGNOSIS — R232 Flushing: Secondary | ICD-10-CM

## 2016-07-30 MED ORDER — OMEPRAZOLE 40 MG PO CPDR: 40.0000 mg | DELAYED_RELEASE_CAPSULE | Freq: Every day | ORAL | 3 refills | Status: DC

## 2016-07-30 NOTE — Progress Notes (Signed)
Name: MIROSLAVA SANTELLAN   MRN: [id number]    DOB: [birth year]   Date:07/30/2016       Progress Note  Subjective  Chief Complaint  Chief Complaint  Patient presents with  . FMLA    Needs her leave extended and will go see Nephrologist on the 1st. Still having elevated BP, foggy headed, chills, racing heart, fatigue, hard abdominal and loss of appetite.     HPI  She has been feeling sick since end of May 2018 . It started with SOB and bp going up, she developed a morning cough. She returned for follow mid June with dizziness, weakness and bp was spiking. We tried multiple bp medications and it was causing worsening of dizziness, and bp has been going up and down. She was evaluated by cardiologist ( Dr. Caryl Comes) and nephrologist ( Dr. Holley Raring) and is now on low dose Lisinopril twice daily, however she is taking once a day because bp drops and dizziness gets worse. She has not been able to go back to work. She is poor historian, but daughter came in with her and states she also complains of weakness, leg pain - aching and has to sit at the edge of bed to make it feel better. Facial flushing, and sometimes some burning sensation on arms and upper back, lack of appetite, upper abdominal pain ( described as aching , bloating sensation), change in bowel movements ( used to have two bowel movements daily) currently has episodes of no bowel movements, followed by episodes of soft stools a couple of times a day, followed by another episode of constipation. No blood in stools. No nausea or vomiting, but continues to get dizzy, fatigue, and palpitation. She will see endo in September.    Patient Active Problem List   Diagnosis Date Noted  . 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 History of radioactive iodine thyroid ablation   . 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 Right leg DVT ([place]) 08/01/2015  . Iron deficiency anemia 07/27/2015  . B12 deficiency  07/27/2015  . Bilateral pulmonary embolism ([place]) 07/25/2015  . Adrenal nodule ([place]) 07/25/2015  . Pulmonary nodules/lesions, multiple 07/25/2015  . OSA (obstructive sleep apnea) 05/18/2015  . Thyroid nodule 12/17/2014  . Heavy menstrual period 09/02/2014  . Gastroesophageal reflux disease without esophagitis 09/02/2014  . Extreme obesity 06/22/2014  . Large breasts 06/22/2014  . Anemia, iron deficiency 06/22/2014  . H/O hyperthyroidism 06/22/2014  . Right ventricular dilation 06/22/2014  . 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    Past Surgical History:  Procedure Laterality Date  . CESAREAN SECTION      Family History  Problem Relation Age of Onset  . CVA Father     Social History   Social History  . Marital status: Married    Spouse name: N/A  . Number of children: N/A  . Years of education: N/A   Occupational History  . Not on file.   Social History Main Topics  . Smoking status: Never Smoker  . Smokeless tobacco: Never Used  . Alcohol use No     Comment: rarely  . Drug use: No  . Sexual activity: Yes    Partners: Male    Birth control/ protection: Condom   Other Topics Concern  . Not on file   Social History Narrative   Lives at home with husband.     Current Outpatient Prescriptions:  .  lisinopril (PRINIVIL,ZESTRIL) 10  MG tablet, Take 1 tablet (10 mg total) by mouth 2 (two) times daily. (Patient taking differently: Take 10 mg by mouth daily. ), Disp: 60 tablet, Rfl: 0 .  vitamin B-12 1000 MCG tablet, Take 1 tablet (1,000 mcg total) by mouth daily., Disp: 30 tablet, Rfl: 0 .  cyclobenzaprine (FLEXERIL) 10 MG tablet, Take 10 mg by mouth 3 (three) times daily as needed for muscle spasms. , Disp: , Rfl:  .  ferrous sulfate 325 (65 FE) MG tablet, Take 1 tablet (325 mg total) by mouth 2 (two) times daily with a meal. (Patient not taking: Reported on 07/30/2016), Disp: 180 tablet, Rfl: 1 .  omeprazole  (PRILOSEC) 40 MG capsule, Take 1 capsule (40 mg total) by mouth daily., Disp: 30 capsule, Rfl: 3  Allergies  Allergen Reactions  . Aspirin Nausea And Vomiting  . Hctz [Hydrochlorothiazide] Other (See Comments)    Nausea, cramping     ROS  Constitutional: Negative for fever, positive for  weight change.  Respiratory: Negative for cough and shortness of breath.   Cardiovascular: Negative for chest pain or palpitations.  Gastrointestinal: Positive  for abdominal pain, and change in bowel changes.  Musculoskeletal: Negative for gait problem or joint swelling.  Skin: Negative for rash.   Neurological: Positive  for dizziness and  headache.  No other specific complaints in a complete review of systems (except as listed in HPI above).  Objective  Vitals:   07/30/16 1304  BP: (!) 154/100  Pulse: 81  Resp: 16  Temp: 97.9 F (36.6 C)  TempSrc: Oral  SpO2: 96%  Weight: (!) 332 lb (150.6 kg)  Height: 5' 7"  (1.702 m)    Body mass index is 52 kg/m.  Physical Exam  Constitutional: Patient appears well-developed and well-nourished. Obese  No distress.  HEENT: head atraumatic, normocephalic, pupils equal and reactive to light,neck supple, throat within normal limits Cardiovascular: Normal rate, regular rhythm and normal heart sounds.  No murmur heard. No BLE edema. Pulmonary/Chest: Effort normal and breath sounds normal. No respiratory distress. Abdominal: Soft.  There is upper abdominal tenderness, no masses, normal bowel sounds.  Psychiatric: Patient has a normal mood and affect. behavior is normal. Judgment and thought content normal.  Recent Results (from the past 2160 hour(s))  CBC with Differential     Status: Abnormal   Collection Time: 05/03/16 10:40 AM  Result Value Ref Range   WBC 7.0 3.6 - 11.0 K/uL   RBC 3.19 (L) 3.80 - 5.20 MIL/uL   Hemoglobin 8.3 (L) 12.0 - 16.0 g/dL   HCT 25.9 (L) 35.0 - 47.0 %   MCV 81.3 80.0 - 100.0 fL   MCH 25.9 (L) 26.0 - 34.0 pg   MCHC  31.9 (L) 32.0 - 36.0 g/dL   RDW 18.3 (H) 11.5 - 14.5 %   Platelets 335 150 - 440 K/uL   Neutrophils Relative % 53 %   Neutro Abs 3.8 1.4 - 6.5 K/uL   Lymphocytes Relative 35 %   Lymphs Abs 2.4 1.0 - 3.6 K/uL   Monocytes Relative 9 %   Monocytes Absolute 0.6 0.2 - 0.9 K/uL   Eosinophils Relative 3 %   Eosinophils Absolute 0.2 0 - 0.7 K/uL   Basophils Relative 0 %   Basophils Absolute 0.0 0 - 0.1 K/uL  Ferritin     Status: Abnormal   Collection Time: 05/03/16 10:40 AM  Result Value Ref Range   Ferritin 7 (L) 11 - 307 ng/mL  Iron and TIBC  Status: Abnormal   Collection Time: 05/03/16 10:40 AM  Result Value Ref Range   Iron 12 (L) 28 - 170 ug/dL   TIBC 312 250 - 450 ug/dL   Saturation Ratios 4 (L) 10.4 - 31.8 %   UIBC 300 ug/dL  CBC with Differential/Platelet     Status: Abnormal   Collection Time: 05/18/16  4:49 PM  Result Value Ref Range   WBC 6.9 3.6 - 11.0 K/uL   RBC 3.91 3.80 - 5.20 MIL/uL   Hemoglobin 10.9 (L) 12.0 - 16.0 g/dL   HCT 33.6 (L) 35.0 - 47.0 %   MCV 86.1 80.0 - 100.0 fL   MCH 27.9 26.0 - 34.0 pg   MCHC 32.4 32.0 - 36.0 g/dL   RDW 22.5 (H) 11.5 - 14.5 %   Platelets 256 150 - 440 K/uL   Neutrophils Relative % 64 %   Neutro Abs 4.5 1.4 - 6.5 K/uL   Lymphocytes Relative 25 %   Lymphs Abs 1.7 1.0 - 3.6 K/uL   Monocytes Relative 7 %   Monocytes Absolute 0.5 0.2 - 0.9 K/uL   Eosinophils Relative 3 %   Eosinophils Absolute 0.2 0 - 0.7 K/uL   Basophils Relative 1 %   Basophils Absolute 0.0 0 - 0.1 K/uL  Comprehensive metabolic panel     Status: Abnormal   Collection Time: 05/18/16  4:49 PM  Result Value Ref Range   Sodium 142 135 - 145 mmol/L   Potassium 3.8 3.5 - 5.1 mmol/L   Chloride 107 101 - 111 mmol/L   CO2 30 22 - 32 mmol/L   Glucose, Bld 99 65 - 99 mg/dL   BUN 13 6 - 20 mg/dL   Creatinine, Ser 0.73 0.44 - 1.00 mg/dL   Calcium 9.2 8.9 - 10.3 mg/dL   Total Protein 7.8 6.5 - 8.1 g/dL   Albumin 3.6 3.5 - 5.0 g/dL   AST 12 (L) 15 - 41 U/L   ALT 10  (L) 14 - 54 U/L   Alkaline Phosphatase 77 38 - 126 U/L   Total Bilirubin 0.3 0.3 - 1.2 mg/dL   GFR calc non Af Amer >60 >60 mL/min   GFR calc Af Amer >60 >60 mL/min    Comment: (NOTE) The eGFR has been calculated using the CKD EPI equation. This calculation has not been validated in all clinical situations. eGFR's persistently <60 mL/min signify possible Chronic Kidney Disease.    Anion gap 5 5 - 15  Troponin I     Status: None   Collection Time: 05/18/16  4:49 PM  Result Value Ref Range   Troponin I <0.03 <0.03 ng/mL  Brain natriuretic peptide     Status: None   Collection Time: 05/18/16  4:49 PM  Result Value Ref Range   B Natriuretic Peptide 64.0 0.0 - 100.0 pg/mL  COMPLETE METABOLIC PANEL WITH GFR     Status: Abnormal   Collection Time: 06/13/16 12:31 PM  Result Value Ref Range   Sodium 140 135 - 146 mmol/L   Potassium 3.9 3.5 - 5.3 mmol/L   Chloride 101 98 - 110 mmol/L   CO2 32 (H) 20 - 31 mmol/L   Glucose, Bld 106 (H) 65 - 99 mg/dL   BUN 9 7 - 25 mg/dL   Creat 0.69 0.50 - 1.10 mg/dL   Total Bilirubin 0.4 0.2 - 1.2 mg/dL   Alkaline Phosphatase 78 33 - 115 U/L   AST 8 (L) 10 - 35 U/L   ALT  8 6 - 29 U/L   Total Protein 6.8 6.1 - 8.1 g/dL   Albumin 3.6 3.6 - 5.1 g/dL   Calcium 9.5 8.6 - 10.2 mg/dL   GFR, Est African American >89 >=60 mL/min   GFR, Est Non African American >89 >=60 mL/min  TSH     Status: None   Collection Time: 06/13/16 12:31 PM  Result Value Ref Range   TSH 3.12 mIU/L    Comment:   Reference Range   > or = 20 Years  0.40-4.50   Pregnancy Range First trimester  0.26-2.66 Second trimester 0.55-2.73 Third trimester  0.43-2.91     Basic metabolic panel     Status: Abnormal   Collection Time: 06/13/16  1:02 PM  Result Value Ref Range   Sodium 137 135 - 145 mmol/L   Potassium 3.4 (L) 3.5 - 5.1 mmol/L   Chloride 100 (L) 101 - 111 mmol/L   CO2 30 22 - 32 mmol/L   Glucose, Bld 110 (H) 65 - 99 mg/dL   BUN 9 6 - 20 mg/dL   Creatinine, Ser 0.54  0.44 - 1.00 mg/dL   Calcium 9.5 8.9 - 10.3 mg/dL   GFR calc non Af Amer >60 >60 mL/min   GFR calc Af Amer >60 >60 mL/min    Comment: (NOTE) The eGFR has been calculated using the CKD EPI equation. This calculation has not been validated in all clinical situations. eGFR's persistently <60 mL/min signify possible Chronic Kidney Disease.    Anion gap 7 5 - 15  CBC     Status: Abnormal   Collection Time: 06/13/16  1:02 PM  Result Value Ref Range   WBC 5.3 3.6 - 11.0 K/uL   RBC 4.22 3.80 - 5.20 MIL/uL   Hemoglobin 12.0 12.0 - 16.0 g/dL   HCT 36.3 35.0 - 47.0 %   MCV 86.0 80.0 - 100.0 fL   MCH 28.4 26.0 - 34.0 pg   MCHC 33.0 32.0 - 36.0 g/dL   RDW 19.8 (H) 11.5 - 14.5 %   Platelets 253 150 - 440 K/uL  Troponin I     Status: None   Collection Time: 06/13/16  1:02 PM  Result Value Ref Range   Troponin I <0.03 <0.03 ng/mL  TSH     Status: None   Collection Time: 06/13/16  1:02 PM  Result Value Ref Range   TSH 3.895 0.350 - 4.500 uIU/mL    Comment: Performed by a 3rd Generation assay with a functional sensitivity of <=0.01 uIU/mL.  T4, free     Status: None   Collection Time: 06/13/16  1:02 PM  Result Value Ref Range   Free T4 0.77 0.61 - 1.12 ng/dL    Comment: (NOTE) Biotin ingestion may interfere with free T4 tests. If the results are inconsistent with the TSH level, previous test results, or the clinical presentation, then consider biotin interference. If needed, order repeat testing after stopping biotin.   hCG, quantitative, pregnancy     Status: None   Collection Time: 06/13/16  1:02 PM  Result Value Ref Range   hCG, Beta Chain, Quant, S 1 <5 mIU/mL    Comment:          GEST. AGE      CONC.  (mIU/mL)   <=1 WEEK        5 - 50     2 WEEKS       50 - 500     3 WEEKS  100 - 10,000     4 WEEKS     1,000 - 30,000     5 WEEKS     3,500 - 115,000   6-8 WEEKS     12,000 - 270,000    12 WEEKS     15,000 - 220,000        FEMALE AND NON-PREGNANT FEMALE:     LESS THAN 5  mIU/mL   Glucose, capillary     Status: Abnormal   Collection Time: 06/13/16  1:40 PM  Result Value Ref Range   Glucose-Capillary 106 (H) 65 - 99 mg/dL  Urinalysis, Complete w Microscopic     Status: Abnormal   Collection Time: 06/13/16  4:27 PM  Result Value Ref Range   Color, Urine YELLOW (A) YELLOW   APPearance HAZY (A) CLEAR   Specific Gravity, Urine 1.013 1.005 - 1.030   pH 6.0 5.0 - 8.0   Glucose, UA NEGATIVE NEGATIVE mg/dL   Hgb urine dipstick MODERATE (A) NEGATIVE   Bilirubin Urine NEGATIVE NEGATIVE   Ketones, ur NEGATIVE NEGATIVE mg/dL   Protein, ur NEGATIVE NEGATIVE mg/dL   Nitrite POSITIVE (A) NEGATIVE   Leukocytes, UA TRACE (A) NEGATIVE   RBC / HPF 0-5 0 - 5 RBC/hpf   WBC, UA 0-5 0 - 5 WBC/hpf   Bacteria, UA MANY (A) NONE SEEN   Squamous Epithelial / LPF 0-5 (A) NONE SEEN   Mucous PRESENT   Troponin I     Status: None   Collection Time: 06/13/16  4:27 PM  Result Value Ref Range   Troponin I <0.03 <0.03 ng/mL  Basic metabolic panel     Status: Abnormal   Collection Time: 06/18/16  9:36 AM  Result Value Ref Range   Sodium 136 135 - 145 mmol/L   Potassium 3.4 (L) 3.5 - 5.1 mmol/L   Chloride 100 (L) 101 - 111 mmol/L   CO2 30 22 - 32 mmol/L   Glucose, Bld 114 (H) 65 - 99 mg/dL   BUN 13 6 - 20 mg/dL   Creatinine, Ser 0.86 0.44 - 1.00 mg/dL   Calcium 9.2 8.9 - 10.3 mg/dL   GFR calc non Af Amer >60 >60 mL/min   GFR calc Af Amer >60 >60 mL/min    Comment: (NOTE) The eGFR has been calculated using the CKD EPI equation. This calculation has not been validated in all clinical situations. eGFR's persistently <60 mL/min signify possible Chronic Kidney Disease.    Anion gap 6 5 - 15  CBC     Status: Abnormal   Collection Time: 06/18/16  9:36 AM  Result Value Ref Range   WBC 5.5 3.6 - 11.0 K/uL   RBC 4.46 3.80 - 5.20 MIL/uL   Hemoglobin 12.5 12.0 - 16.0 g/dL   HCT 38.1 35.0 - 47.0 %   MCV 85.4 80.0 - 100.0 fL   MCH 28.0 26.0 - 34.0 pg   MCHC 32.8 32.0 - 36.0 g/dL    RDW 19.4 (H) 11.5 - 14.5 %   Platelets 247 150 - 440 K/uL  Troponin I     Status: None   Collection Time: 06/18/16  9:56 AM  Result Value Ref Range   Troponin I <0.03 <0.03 ng/mL  APTT     Status: None   Collection Time: 06/18/16  9:56 AM  Result Value Ref Range   aPTT 27 24 - 36 seconds  Urinalysis, Complete w Microscopic     Status: Abnormal   Collection Time: 06/18/16  2:20 PM  Result Value Ref Range   Color, Urine YELLOW (A) YELLOW   APPearance HAZY (A) CLEAR   Specific Gravity, Urine 1.012 1.005 - 1.030   pH 6.0 5.0 - 8.0   Glucose, UA NEGATIVE NEGATIVE mg/dL   Hgb urine dipstick MODERATE (A) NEGATIVE   Bilirubin Urine NEGATIVE NEGATIVE   Ketones, ur NEGATIVE NEGATIVE mg/dL   Protein, ur NEGATIVE NEGATIVE mg/dL   Nitrite NEGATIVE NEGATIVE   Leukocytes, UA SMALL (A) NEGATIVE   RBC / HPF 0-5 0 - 5 RBC/hpf   WBC, UA 6-30 0 - 5 WBC/hpf   Bacteria, UA RARE (A) NONE SEEN   Squamous Epithelial / LPF 6-30 (A) NONE SEEN   Mucous PRESENT   Urine culture     Status: Abnormal   Collection Time: 06/18/16  2:20 PM  Result Value Ref Range   Specimen Description URINE, RANDOM    Special Requests NONE    Culture MULTIPLE SPECIES PRESENT, SUGGEST RECOLLECTION (A)    Report Status 06/20/2016 FINAL   Fibrin derivatives D-Dimer ([facility] only)     Status: None   Collection Time: 06/20/16  1:42 PM  Result Value Ref Range   Fibrin derivatives D-dimer ([facility]) 386.70 0.00 - 499.00    Comment: (NOTE) <> Exclusion of Venous Thromboembolism (VTE) - OUTPATIENT ONLY   (Emergency Department or [place])   0-499 ng/ml (FEU): With a low to intermediate pretest probability                      for VTE this test result excludes the diagnosis                      of VTE.   >499 ng/ml (FEU) : VTE not excluded; additional work up for VTE is                      required. <> Testing on Inpatients and Evaluation of Disseminated Intravascular   Coagulation ([facility]) Reference Range:   0-499 ng/ml (FEU)    Basic metabolic panel     Status: None   Collection Time: 06/30/16  7:48 PM  Result Value Ref Range   Sodium 139 135 - 145 mmol/L   Potassium 3.6 3.5 - 5.1 mmol/L   Chloride 104 101 - 111 mmol/L   CO2 26 22 - 32 mmol/L   Glucose, Bld 99 65 - 99 mg/dL   BUN 10 6 - 20 mg/dL   Creatinine, Ser 0.78 0.44 - 1.00 mg/dL   Calcium 9.2 8.9 - 10.3 mg/dL   GFR calc non Af Amer >60 >60 mL/min   GFR calc Af Amer >60 >60 mL/min    Comment: (NOTE) The eGFR has been calculated using the CKD EPI equation. This calculation has not been validated in all clinical situations. eGFR's persistently <60 mL/min signify possible Chronic Kidney Disease.    Anion gap 9 5 - 15  CBC     Status: Abnormal   Collection Time: 06/30/16  7:48 PM  Result Value Ref Range   WBC 6.0 3.6 - 11.0 K/uL   RBC 4.15 3.80 - 5.20 MIL/uL   Hemoglobin 12.1 12.0 - 16.0 g/dL   HCT 36.3 35.0 - 47.0 %   MCV 87.4 80.0 - 100.0 fL   MCH 29.1 26.0 - 34.0 pg   MCHC 33.4 32.0 - 36.0 g/dL   RDW 18.6 (H) 11.5 - 14.5 %   Platelets 227 150 - 440 K/uL  Troponin I     Status: None   Collection Time: 06/30/16  7:48 PM  Result Value Ref Range   Troponin I <0.03 <0.03 ng/mL  Urinalysis, Complete w Microscopic     Status: Abnormal   Collection Time: 07/01/16 12:00 AM  Result Value Ref Range   Color, Urine YELLOW (A) YELLOW   APPearance HAZY (A) CLEAR   Specific Gravity, Urine 1.024 1.005 - 1.030   pH 5.0 5.0 - 8.0   Glucose, UA NEGATIVE NEGATIVE mg/dL   Hgb urine dipstick MODERATE (A) NEGATIVE   Bilirubin Urine NEGATIVE NEGATIVE   Ketones, ur 20 (A) NEGATIVE mg/dL   Protein, ur NEGATIVE NEGATIVE mg/dL   Nitrite NEGATIVE NEGATIVE   Leukocytes, UA TRACE (A) NEGATIVE   RBC / HPF 0-5 0 - 5 RBC/hpf   WBC, UA 6-30 0 - 5 WBC/hpf   Bacteria, UA RARE (A) NONE SEEN   Squamous Epithelial / LPF 6-30 (A) NONE SEEN   Mucous PRESENT   Urine Culture     Status: Abnormal   Collection Time: 07/01/16 12:00 AM  Result Value Ref Range   Specimen  Description URINE, RANDOM    Special Requests Normal    Culture MULTIPLE SPECIES PRESENT, SUGGEST RECOLLECTION (A)    Report Status 07/02/2016 FINAL   TSH     Status: Abnormal   Collection Time: 07/01/16  3:50 AM  Result Value Ref Range   TSH 6.699 (H) 0.350 - 4.500 uIU/mL    Comment: Performed by a 3rd Generation assay with a functional sensitivity of <=0.01 uIU/mL.  Troponin I     Status: None   Collection Time: 07/01/16  3:50 AM  Result Value Ref Range   Troponin I <0.03 <0.03 ng/mL  Troponin I     Status: None   Collection Time: 07/01/16 10:08 AM  Result Value Ref Range   Troponin I <0.03 <0.03 ng/mL  Troponin I     Status: None   Collection Time: 07/01/16  4:19 PM  Result Value Ref Range   Troponin I <0.03 <0.03 ng/mL  ECHOCARDIOGRAM COMPLETE     Status: Abnormal   Collection Time: 07/02/16  8:06 PM  Result Value Ref Range   Weight 5,580.8 oz   Height 67 in   BP 146/84 mmHg   LV PW d 15.4 (A) 0.6 - 1.1 mm   FS 31 28 - 44 %   LA vol 58.2 mL   LA ID, A-P, ES 41 mm   IVS/LV PW RATIO, ED .59    LV e' LATERAL 10.1 cm/s   LV E/e' medial 8.66    LV E/e'average 8.66    LA diam index 1.45 cm/m2   LA vol A4C 54.2 ml   Area-P 1/2 3.79 cm2   E decel time 197 msec   Peak grad 3 mmHg   E/e' ratio 8.66    MV pk E vel 87.5 m/s   P 1/2 time 58 ms   MV pk A vel 105 m/s   LA vol index 20.6 mL/m2   MV Dec 197    LA diam end sys 41.00 mm   TDI e' medial 5.98    TDI e' lateral 10.10    Lateral S' vel 15.30 cm/sec   TAPSE 41.20 mm  CBC     Status: Abnormal   Collection Time: 07/24/16 11:31 AM  Result Value Ref Range   WBC 5.7 3.6 - 11.0 K/uL   RBC 3.96 3.80 - 5.20 MIL/uL  Hemoglobin 11.7 (L) 12.0 - 16.0 g/dL   HCT 34.9 (L) 35.0 - 47.0 %   MCV 88.2 80.0 - 100.0 fL   MCH 29.6 26.0 - 34.0 pg   MCHC 33.6 32.0 - 36.0 g/dL   RDW 17.3 (H) 11.5 - 14.5 %   Platelets 252 150 - 440 K/uL  Comprehensive metabolic panel     Status: Abnormal   Collection Time: 07/24/16 11:31 AM   Result Value Ref Range   Sodium 139 135 - 145 mmol/L   Potassium 3.6 3.5 - 5.1 mmol/L   Chloride 104 101 - 111 mmol/L   CO2 27 22 - 32 mmol/L   Glucose, Bld 115 (H) 65 - 99 mg/dL   BUN 9 6 - 20 mg/dL   Creatinine, Ser 0.69 0.44 - 1.00 mg/dL   Calcium 9.4 8.9 - 10.3 mg/dL   Total Protein 7.6 6.5 - 8.1 g/dL   Albumin 3.5 3.5 - 5.0 g/dL   AST 13 (L) 15 - 41 U/L   ALT 11 (L) 14 - 54 U/L   Alkaline Phosphatase 76 38 - 126 U/L   Total Bilirubin 0.8 0.3 - 1.2 mg/dL   GFR calc non Af Amer >60 >60 mL/min   GFR calc Af Amer >60 >60 mL/min    Comment: (NOTE) The eGFR has been calculated using the CKD EPI equation. This calculation has not been validated in all clinical situations. eGFR's persistently <60 mL/min signify possible Chronic Kidney Disease.    Anion gap 8 5 - 15  TSH     Status: None   Collection Time: 07/24/16 11:31 AM  Result Value Ref Range   TSH 2.789 0.350 - 4.500 uIU/mL    Comment: Performed by a 3rd Generation assay with a functional sensitivity of <=0.01 uIU/mL.  Troponin I     Status: None   Collection Time: 07/24/16 11:31 AM  Result Value Ref Range   Troponin I <0.03 <0.03 ng/mL     PHQ2/9: Depression screen [facility] 2/9 06/22/2016 12/20/2015 09/02/2015 03/04/2015 12/03/2014  Decreased Interest 0 0 0 0 0  Down, Depressed, Hopeless 0 0 0 0 0  PHQ - 2 Score 0 0 0 0 0     Fall Risk: Fall Risk  06/22/2016 05/03/2016 12/20/2015 09/02/2015 03/04/2015  Falls in the past year? No No No No No     Assessment & Plan  1. Uncontrolled hypertension  Keep follow up with nephrologist  2. Goiter, toxic, multinodular  She will see Endo in September  3. Recent change in frequency of bowel movements  - Ambulatory referral to Gastroenterology - CT Abdomen Pelvis W Contrast; Future  4. Lightheadedness  Still happens  5. Lack of appetite  - omeprazole (PRILOSEC) 40 MG capsule; Take 1 capsule (40 mg total) by mouth daily.  Dispense: 30 capsule; Refill: 3 - Ambulatory  referral to Gastroenterology  6. Epigastric pain  - omeprazole (PRILOSEC) 40 MG capsule; Take 1 capsule (40 mg total) by mouth daily.  Dispense: 30 capsule; Refill: 3 - Ambulatory referral to Gastroenterology  7. Abnormal weight loss  She has lost 13 lbs in the past week - Ambulatory referral to Gastroenterology - CT Abdomen Pelvis W Contrast; Future  8. Facial flushing  Concerned about carcinoid syndrome - Ambulatory referral to Gastroenterology - CT Abdomen Pelvis W Contrast; Future - 5 HIAA, quantitative, Urine, 24 hour

## 2016-07-30 NOTE — Patient Instructions (Signed)
5-Hydroxyindoleacetic Acid Test Why am I having this test? This is a 24-hour urine collection test that measures a product of serotonin called 5-HIAA. This test is done to check the amount of 5-HIAA that your body is removing. This test helps to detect some types of tumors (carcinoid tumors) and certain other medical conditions. This test is also used to monitor the treatment of carcinoid tumors. What kind of sample is taken? A urine sample is collected in a sterile container that will be given to you by the lab to use at home before the test. How do I collect samples at home? You will be asked to collect a urine sample at home over a period of 24 hours. Follow your health care provider's instructions about how to collect and store your sample. How do I prepare for this test? Before the test, you will need to avoid certain foods, alcohol, prescriptions, and over-the-counter medications. Ask your health care provider how long you should follow these instructions before taking the test:  Do not eat foods that contain the hormone serotonin. This includes foods such as bananas, plantains, pineapple, kiwi, walnuts, plums, pecans, eggplant, tomatoes, and avocados. Do not eat those foods while taking the test either.  Do not drink alcohol.  Do not take prescriptions and over-the-counter medicines that interfere with the test. Ask your health care provider about changing or stopping your regular medicines.  What are the reference ranges? Reference ranges are considered healthy ranges established after testing a large group of healthy people. Reference ranges may vary among different people, labs, and hospitals. It is your responsibility to obtain your test results. Ask the lab or department performing the test when and how you will get your results. What do the results mean? The reference range for this test is 2-8 mg in a 24-hour period or 10-40 micromoles per day (SI units). Increased levels of 5-HIAA  may indicate:  The presence of a carcinoid tumor.  Cystic fibrosis.  Noncarcinoid illness.  Intestinal malabsorption.  If you already have a carcinoid tumor, an increased level of 5-HIAA may also indicate that your treatment is not working. Decreased levels of 5-HIAA may indicate:  Depression.  Migraine.  If you already have a carcinoid tumor, a decreased level of 5-HIAA may also indicate that your treatment is working. Talk with your health care provider to discuss your results, treatment options, and if necessary, the need for more tests. Talk with your health care provider if you have any questions about your results. Talk with your health care provider to discuss your results, treatment options, and if necessary, the need for more tests. Talk with your health care provider if you have any questions about your results. This information is not intended to replace advice given to you by your health care provider. Make sure you discuss any questions you have with your health care provider. Document Released: 01/10/2004 Document Revised: 08/22/2015 Document Reviewed: 05/15/2013 Elsevier Interactive Patient Education  2018 [employer] Inc[employer]  

## 2016-07-31 ENCOUNTER — Encounter: Payer: Self-pay | Admitting: Internal Medicine

## 2016-07-31 ENCOUNTER — Ambulatory Visit (INDEPENDENT_AMBULATORY_CARE_PROVIDER_SITE_OTHER): Payer: 59 | Admitting: Internal Medicine

## 2016-07-31 VITALS — BP 148/90 | HR 85 | Ht 67.0 in | Wt 330.0 lb

## 2016-07-31 DIAGNOSIS — R42 Dizziness and giddiness: Secondary | ICD-10-CM

## 2016-07-31 DIAGNOSIS — I428 Other cardiomyopathies: Secondary | ICD-10-CM | POA: Diagnosis not present

## 2016-07-31 NOTE — Progress Notes (Signed)
Patient Care Team: Steele Sizer, MD as PCP - General (Family Medicine)   HPI  Madeline Dominguez is a 51 y.o. female Seen in follow-up for atypical chest pain typically associated with eating. She also has had problems with dyspnea. Echocardiogram demonstrated mild LV dysfunction  She is Morbidly obese and has sx of OSA   MRI neg for stroke  DATE TEST    6/16    Echo   EF 55-60 %   7/17    Echo   EF 45-50 %   7/18 Echo EF 45-50    She has spells that come on 2 or 3 times a month and last 3 or 4 days at a time which she describes as lightheadedness and almost an out of body sensation. This occurred in the hospital upon presentation. Vital signs were normal on arrival and most of her hospitalization. Telemetry on the third hospital day demonstrated a heart rate in the 150; activities with this are not described    Past Medical History:  Diagnosis Date  . Cough 07/25/2015  . Dilated cardiomyopathy ([place])   . H/O: hypothyroidism   . Heart palpitations   . Hypertension   . Iron deficiency   . Large breasts   . Morbid obesity ([place])   . Postpartum hemorrhage   . Thyroid disease     Past Surgical History:  Procedure Laterality Date  . CESAREAN SECTION      Current Outpatient Prescriptions  Medication Sig Dispense Refill  . cyclobenzaprine (FLEXERIL) 10 MG tablet Take 10 mg by mouth 3 (three) times daily as needed for muscle spasms.     . ferrous sulfate 325 (65 FE) MG tablet Take 1 tablet (325 mg total) by mouth 2 (two) times daily with a meal. (Patient not taking: Reported on 07/30/2016) 180 tablet 1  . lisinopril (PRINIVIL,ZESTRIL) 10 MG tablet Take 1 tablet (10 mg total) by mouth 2 (two) times daily. (Patient taking differently: Take 10 mg by mouth daily. ) 60 tablet 0  . omeprazole (PRILOSEC) 40 MG capsule Take 1 capsule (40 mg total) by mouth daily. 30 capsule 3  . vitamin B-12 1000 MCG tablet Take 1 tablet (1,000 mcg total) by mouth daily. 30 tablet 0   No current  facility-administered medications for this visit.     Allergies  Allergen Reactions  . Aspirin Nausea And Vomiting  . Hctz [Hydrochlorothiazide] Other (See Comments)    Nausea, cramping      Review of Systems negative except from HPI and PMH  Physical Exam BP (!) 148/90   Pulse 85   Ht 5\' 7"  (1.702 m)   Wt (!) 330 lb (149.7 kg)   LMP  (LMP Unknown)   SpO2 96%   BMI 51.69 kg/m  Well developed and Morbidly obese  in no acute distress HENT normal Neck supple with JVP-flat Carotids brisk and full without bruits Clear Regular rate and rhythm, no murmurs or gallops Abd-soft with active BS without hepatomegaly No Clubbing cyanosis edema Skin-warm and dry A & Oriented  Grossly normal sensory and motor function   ECG Sinus 84 17/10/41 occ PVCs  Assessment and  Plan  Hypertension  Morbidly obese  BMI >50  Cardiomyopathy  PVCs  Palpitations  Dyspnea  Sleep disordered breathing  She has modestly controlled hypertension.  Her dyspnea is unlikely cardiac, x restrictive related to obesity   Cardiomyopathy may be contributing and for now would continue ACE Rx to prevent worsening  BB could be used in addition  No clear cardiac explanation for her spells, with ongoing symptoms with normal vital signs and not attenuated by recumbency  No further EP eval needed   Can followup with general cardiology         Current medicines are reviewed at length with the patient today .  The patient does not  have concerns regarding medicines.

## 2016-07-31 NOTE — Patient Instructions (Signed)
Medication Instructions: - Your physician recommends that you continue on your current medications as directed. Please refer to the Current Medication list given to you today.  Labwork: - none ordered  Procedures/Testing: - none ordered  Follow-Up: - Your physician recommends that you schedule a follow-up appointment in: 3 months with the PA/ NP in the [facility] office.   Any Additional Special Instructions Will Be Listed Below (If Applicable).     If you need a refill on your cardiac medications before your next appointment, please call your pharmacy.

## 2016-08-01 DIAGNOSIS — I10 Essential (primary) hypertension: Secondary | ICD-10-CM | POA: Diagnosis not present

## 2016-08-01 DIAGNOSIS — R809 Proteinuria, unspecified: Secondary | ICD-10-CM | POA: Diagnosis not present

## 2016-08-01 DIAGNOSIS — G4733 Obstructive sleep apnea (adult) (pediatric): Secondary | ICD-10-CM | POA: Diagnosis not present

## 2016-08-02 NOTE — Addendum Note (Signed)
Addended by: Campbell Riches on: 08/02/2016 07:45 AM   Modules accepted: Orders

## 2016-08-05 LAB — 5 HIAA, QUANTITATIVE, URINE, 24 HOUR: 5-HIAA, URINE: 1.2 mg/(24.h) (ref <=6.0)

## 2016-08-07 ENCOUNTER — Ambulatory Visit (INDEPENDENT_AMBULATORY_CARE_PROVIDER_SITE_OTHER): Payer: 59 | Admitting: Family Medicine

## 2016-08-07 ENCOUNTER — Encounter: Payer: Self-pay | Admitting: Family Medicine

## 2016-08-07 VITALS — HR 96 | Temp 98.3°F | Resp 18 | Ht 67.0 in | Wt 325.1 lb

## 2016-08-07 DIAGNOSIS — I10 Essential (primary) hypertension: Secondary | ICD-10-CM

## 2016-08-07 DIAGNOSIS — E278 Other specified disorders of adrenal gland: Secondary | ICD-10-CM

## 2016-08-07 DIAGNOSIS — E279 Disorder of adrenal gland, unspecified: Secondary | ICD-10-CM

## 2016-08-07 DIAGNOSIS — R42 Dizziness and giddiness: Secondary | ICD-10-CM | POA: Diagnosis not present

## 2016-08-07 MED ORDER — LISINOPRIL 10 MG PO TABS: 10.0000 mg | ORAL_TABLET | Freq: Every day | ORAL | 0 refills | Status: DC

## 2016-08-07 NOTE — Progress Notes (Signed)
Psychiatric Initial Adult Assessment   Patient Identification: Madeline Dominguez MRN:  [id number] Date of Evaluation:  08/10/2016 Referral Source: [facility] Chief Complaint:   Chief Complaint    Depression; Anxiety; Establish Care     Visit Diagnosis:    ICD-10-CM   1. Current moderate episode of major depressive disorder without prior episode (HCC) F32.1     History of Present Illness:   Madeline Dominguez is a 51 year old female with hypertension, dilated cardiomyopathy, bilateral pulmonary embolism, obesity, obstructive sleep apnea on CPAP, history of toxic multinodular goiter, who is referred for anxiety. Per chart review, she was seen by a cardiologist for referral for EP study given concern for dizziness. "No clear cardiac explanation for her spells, with ongoing symptoms with normal vital signs and not attenuated by recumbency"   Patient states that she was advised by her PCP to come here because her physical symptoms of tingling sensation, dizziness, walking imbalances might be secondary to stress. She denies losing consciousness. She states that her symptoms started a few months month ago, and she has been on FMLA since then. She feels anxious that she might have another "episode" when her children leave the house. She states that she has never had similar episode before nor depressed before this. She cannot recollect any trigger, and she denies that being treated for pulmonary embolism or incarceration of her husband is a source of stress.   She endorses insomnia. She has poor appetite. She has fatigue and has anhedonia. She tends to be isolative. She denies SI, HI, AH, VH. She feels anxious. She denies panic attacks. She has constant racing thought, ruminating on "what's going on with me." She denies decreased need for sleep or euphoria. She denies alcohol use or drug use. She has been off work since end of June due to her current symptoms.    Associated  Signs/Symptoms: Depression Symptoms:  depressed mood, anhedonia, insomnia, fatigue, anxiety, (Hypo) Manic Symptoms:  denies Anxiety Symptoms:  Excessive Worry, Psychotic Symptoms:  denies PTSD Symptoms: Negative  Past Psychiatric History:  Outpatient: denies Psychiatry admission: denies Previous suicide attempt: denies Past trials of medication: denies History of violence:   Previous Psychotropic Medications: No   Substance Abuse History in the last 12 months:  No.  Consequences of Substance Abuse: NA  Past Medical History:  Past Medical History:  Diagnosis Date  . Cough 07/25/2015  . Dilated cardiomyopathy ([place])   . H/O: hypothyroidism   . Heart palpitations   . Hypertension   . Iron deficiency   . Large breasts   . Morbid obesity ([place])   . Postpartum hemorrhage   . Thyroid disease     Past Surgical History:  Procedure Laterality Date  . CESAREAN SECTION      Family Psychiatric History:  denies  Family History:  Family History  Problem Relation Age of Onset  . CVA Father     Social History:   Social History   Social History  . Marital status: Married    Spouse name: N/A  . Number of children: N/A  . Years of education: N/A   Social History Main Topics  . Smoking status: Never Smoker  . Smokeless tobacco: Never Used  . Alcohol use No     Comment: rarely  . Drug use: No  . Sexual activity: Yes    Partners: Male    Birth control/ protection: Condom   Other Topics Concern  . None   Social History Narrative  Lives at home with husband.    Additional Social History:  Education: 12 th grade She grew up in [place], raised by her mother who was a single parent, she has two sisters and one brother. 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 She was a single parent for 25 years.  She lives with her three children (25, 25, 14)  Allergies:   Allergies  Allergen Reactions  . Aspirin Nausea And Vomiting  . Hctz [Hydrochlorothiazide] Other (See Comments)    Nausea, cramping    Metabolic Disorder Labs: Lab Results  Component Value Date   HGBA1C 4.9 04/27/2016   MPG 94 04/27/2016   MPG 94 12/20/2015   No results found for: PROLACTIN Lab Results  Component Value Date   CHOL 178 04/27/2016   TRIG 53 04/27/2016   HDL 68 04/27/2016   CHOLHDL 2.6 04/27/2016   VLDL 11 04/27/2016   LDLCALC 99 04/27/2016   LDLCALC 105 (H) 12/03/2014     Current Medications: Current Outpatient Prescriptions  Medication Sig Dispense Refill  . amLODipine (NORVASC) 5 MG tablet Take 1 tablet by mouth daily.    . cyclobenzaprine (FLEXERIL) 10 MG tablet Take 10 mg by mouth 3 (three) times daily as needed for muscle spasms.     . ferrous sulfate 325 (65 FE) MG tablet Take 1 tablet (325 mg total) by mouth 2 (two) times daily with a meal. 180 tablet 1  . lisinopril (PRINIVIL,ZESTRIL) 10 MG tablet Take 1 tablet (10 mg total) by mouth daily. 90 tablet 0  . omeprazole (PRILOSEC) 40 MG capsule Take 1 capsule (40 mg total) by mouth daily. 30 capsule 3  . vitamin B-12 1000 MCG tablet Take 1 tablet (1,000 mcg total) by mouth daily. 30 tablet 0  . sertraline (ZOLOFT) 50 MG tablet Start 25 mg daily for two weeks, then 50 mg daily 30 tablet 1   No current facility-administered medications for this visit.     Neurologic: Headache: No Seizure: No Paresthesias:No  Musculoskeletal: Strength & Muscle Tone: within normal limits Gait & Station: normal Patient leans: N/A  Psychiatric Specialty Exam: Review of Systems  Neurological: Positive for dizziness and tingling.  Psychiatric/Behavioral: Positive for depression. Negative for hallucinations, substance abuse and suicidal ideas. The patient is nervous/anxious and has insomnia.   All other  systems reviewed and are negative.   Blood pressure (!) 148/106, pulse 60, height 5\' 7"  (1.702 m), weight (!) 325 lb 9.6 oz (147.7 kg).Body mass index is 51 kg/m.  General Appearance: Fairly Groomed  Eye Contact:  Good  Speech:  Clear and Coherent  Volume:  Normal  Mood:  Anxious and Depressed  Affect:  Blunt  Thought Process:  Coherent and Goal Directed  Orientation:  Full (Time, Place, and Person)  Thought Content:  Logical Perceptions: denies AH/VH  Suicidal Thoughts:  No  Homicidal Thoughts:  No  Memory:  Immediate;   Good Recent;   Good Remote;   Good  Judgement:  Good  Insight:  Present  Psychomotor Activity:  Normal  Concentration:  Concentration: Good and Attention Span: Fair  Recall:  Good  Fund of Knowledge:Good  Language: Good  Akathisia:  No  Handed:  Right  AIMS (if indicated):  N/A  Assets:  Communication Skills Desire for Improvement  ADL's:  Intact  Cognition: WNL  Sleep:  poor   Assessment Madeline Dominguez is a 51 year old female with no known psychiatry disease, hypertension, dilated cardiomyopathy, bilateral pulmonary embolism, obesity, obstructive sleep apnea on CPAP, history of toxic multinodular goiter, who is referred for anxiety. Per chart review, she was seen by a cardiologist for referral for EP study given concern for dizziness. "No clear cardiac explanation for her spells, with ongoing symptoms with normal vital signs and not attenuated by recumbency" PCP record was not available.   # MDD, single episode, moderate without psychotic features Exam is notable for her blunted affect and she endorses neurovegetative symptoms and anticipatory anxiety. Although it is difficult to discern whether her fatigue/dizziness is purely secondary to depression, somatic manifestation can certainly exacerbate her symptoms. Will start sertraline with uptitration to target neurovegetative symptoms. Noted that she denies any psychosocial stressors, although she does have  likely stressful situation, which  includes pulmonary embolism and her husband being incarcerated  in Jan 2018; will continue to explore as needed. She will greatly benefit from CBT given her cognitive distortion of catastrophizing and anticipatory anxiety; will make a referral.   #insomnia She endorses middle and initial insomnia. Likely multifactorial given history of sleep apnea. She is encouraged to talk with her provider if her sleep machine needs adjustment. Discussed sleep hygiene.   Plan 1. Start sertraline 25 mg daily for two weeks, then 50 mg daily 2. Referral to therapy  3. Return to clinic in one month for 30 mins 4. Obtain record from her PCP (She was checked TSH per self report; she has an appointment with PCP in September)  The patient demonstrates the following risk factors for suicide: Chronic risk factors for suicide include: psychiatric disorder of depression. Acute risk factors for suicide include: social withdrawal/isolation. Protective factors for this patient include: responsibility to others (children, family) and coping skills. Considering these factors, the overall suicide risk at this point appears to be low. Patient is appropriate for outpatient follow up.   Treatment Plan Summary: Plan as above   [name], MD 8/10/201811:53 AM

## 2016-08-07 NOTE — Progress Notes (Signed)
Name: Madeline Dominguez   MRN: [id number]    DOB: 12-02-1965   Date:08/07/2016       Progress Note  Subjective  Chief Complaint  Chief Complaint  Patient presents with  . Follow-up    1 week F/U  . Hypertension    HPI  She has been feeling sick since end of May 2018 . It started with SOB and bp going up, she developed a morning cough. She returned for follow mid June with dizziness, weakness and bp was spiking. We tried multiple bp medications and it was causing worsening of dizziness, and bp has been going up and down. She was evaluated by cardiologist ( Dr. Caryl Comes) and nephrologist ( Dr. Holley Raring), cleared in terms of cardiac rhythm disorder, also negative cortisol and aldosterone levels.  Currently on Norvasc 5 mg and Lisinopril ( down to 10 mg daily), she states SOB has resolved. . She is poor historian, last visit daughter came in with her and told me that she also had leg weakness. facial flushing, and sometimes some burning sensation on arms and upper back, lack of appetite, upper abdominal pain ( described as aching , bloating sensation), change in bowel movements now is just constipation, no lose stools in between. No blood in stools. No nausea or vomiting, but continues to get dizzy, fatigue, and intermittent palpitation. She will see Endo in September. We checked 5HIAA on her last visit but ph of urine was high and we will repeat it, we will also check for pheochromocytoma since she has an adrenal gland adenoma   Patient Active Problem List   Diagnosis Date Noted  . 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 History of radioactive iodine thyroid ablation   . 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 Right leg DVT ([place]) 08/01/2015  . Iron deficiency anemia 07/27/2015  . B12 deficiency 07/27/2015  . Bilateral pulmonary embolism ([place]) 07/25/2015  . Adrenal nodule ([place]) 07/25/2015  . Pulmonary nodules/lesions, multiple 07/25/2015  . OSA  (obstructive sleep apnea) 05/18/2015  . Thyroid nodule 12/17/2014  . Heavy menstrual period 09/02/2014  . Gastroesophageal reflux disease without esophagitis 09/02/2014  . Extreme obesity 06/22/2014  . Large breasts 06/22/2014  . Anemia, iron deficiency 06/22/2014  . H/O hyperthyroidism 06/22/2014  . Right ventricular dilation 06/22/2014  . 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    Past Surgical History:  Procedure Laterality Date  . CESAREAN SECTION      Family History  Problem Relation Age of Onset  . CVA Father     Social History   Social History  . Marital status: Married    Spouse name: N/A  . Number of children: N/A  . Years of education: N/A   Occupational History  . Not on file.   Social History Main Topics  . Smoking status: Never Smoker  . Smokeless tobacco: Never Used  . Alcohol use No     Comment: rarely  . Drug use: No  . Sexual activity: Yes    Partners: Male    Birth control/ protection: Condom   Other Topics Concern  . Not on file   Social History Narrative   Lives at home with husband.     Current Outpatient Prescriptions:  .  amLODipine (NORVASC) 5 MG tablet, Take 1 tablet by mouth daily., Disp: , Rfl:  .  cyclobenzaprine (FLEXERIL) 10 MG tablet, Take 10 mg by mouth 3 (three) times daily as needed for  muscle spasms. , Disp: , Rfl:  .  ferrous sulfate 325 (65 FE) MG tablet, Take 1 tablet (325 mg total) by mouth 2 (two) times daily with a meal., Disp: 180 tablet, Rfl: 1 .  lisinopril (PRINIVIL,ZESTRIL) 10 MG tablet, Take 1 tablet (10 mg total) by mouth daily., Disp: 90 tablet, Rfl: 0 .  omeprazole (PRILOSEC) 40 MG capsule, Take 1 capsule (40 mg total) by mouth daily., Disp: 30 capsule, Rfl: 3 .  vitamin B-12 1000 MCG tablet, Take 1 tablet (1,000 mcg total) by mouth daily., Disp: 30 tablet, Rfl: 0  Allergies  Allergen Reactions  . Aspirin Nausea And Vomiting  . Hctz [Hydrochlorothiazide]  Other (See Comments)    Nausea, cramping     ROS  Ten systems reviewed and is negative except as mentioned in HPI   Objective  Vitals:   08/07/16 0950  Pulse: 96  Resp: 18  Temp: 98.3 F (36.8 C)  TempSrc: Oral  SpO2: 96%  Weight: (!) 325 lb 1.6 oz (147.5 kg)  Height: 5' 7"  (1.702 m)    Body mass index is 50.92 kg/m.  Physical Exam  Constitutional: Patient appears well-developed and well-nourished. Obese  No distress.  HEENT: head atraumatic, normocephalic, pupils equal and reactive to light, neck supple, throat within normal limits Cardiovascular: Normal rate, regular rhythm and normal heart sounds.  No murmur heard. Trace BLE edema. Pulmonary/Chest: Effort normal and breath sounds normal. No respiratory distress. Abdominal: Soft.  There is no tenderness. Psychiatric: Patient has a normal mood and affect. behavior is normal. Judgment and thought content normal.  Recent Results (from the past 2160 hour(s))  CBC with Differential/Platelet     Status: Abnormal   Collection Time: 05/18/16  4:49 PM  Result Value Ref Range   WBC 6.9 3.6 - 11.0 K/uL   RBC 3.91 3.80 - 5.20 MIL/uL   Hemoglobin 10.9 (L) 12.0 - 16.0 g/dL   HCT 33.6 (L) 35.0 - 47.0 %   MCV 86.1 80.0 - 100.0 fL   MCH 27.9 26.0 - 34.0 pg   MCHC 32.4 32.0 - 36.0 g/dL   RDW 22.5 (H) 11.5 - 14.5 %   Platelets 256 150 - 440 K/uL   Neutrophils Relative % 64 %   Neutro Abs 4.5 1.4 - 6.5 K/uL   Lymphocytes Relative 25 %   Lymphs Abs 1.7 1.0 - 3.6 K/uL   Monocytes Relative 7 %   Monocytes Absolute 0.5 0.2 - 0.9 K/uL   Eosinophils Relative 3 %   Eosinophils Absolute 0.2 0 - 0.7 K/uL   Basophils Relative 1 %   Basophils Absolute 0.0 0 - 0.1 K/uL  Comprehensive metabolic panel     Status: Abnormal   Collection Time: 05/18/16  4:49 PM  Result Value Ref Range   Sodium 142 135 - 145 mmol/L   Potassium 3.8 3.5 - 5.1 mmol/L   Chloride 107 101 - 111 mmol/L   CO2 30 22 - 32 mmol/L   Glucose, Bld 99 65 - 99 mg/dL    BUN 13 6 - 20 mg/dL   Creatinine, Ser 0.73 0.44 - 1.00 mg/dL   Calcium 9.2 8.9 - 10.3 mg/dL   Total Protein 7.8 6.5 - 8.1 g/dL   Albumin 3.6 3.5 - 5.0 g/dL   AST 12 (L) 15 - 41 U/L   ALT 10 (L) 14 - 54 U/L   Alkaline Phosphatase 77 38 - 126 U/L   Total Bilirubin 0.3 0.3 - 1.2 mg/dL   GFR calc  non Af Amer >60 >60 mL/min   GFR calc Af Amer >60 >60 mL/min    Comment: (NOTE) The eGFR has been calculated using the CKD EPI equation. This calculation has not been validated in all clinical situations. eGFR's persistently <60 mL/min signify possible Chronic Kidney Disease.    Anion gap 5 5 - 15  Troponin I     Status: None   Collection Time: 05/18/16  4:49 PM  Result Value Ref Range   Troponin I <0.03 <0.03 ng/mL  Brain natriuretic peptide     Status: None   Collection Time: 05/18/16  4:49 PM  Result Value Ref Range   B Natriuretic Peptide 64.0 0.0 - 100.0 pg/mL  COMPLETE METABOLIC PANEL WITH GFR     Status: Abnormal   Collection Time: 06/13/16 12:31 PM  Result Value Ref Range   Sodium 140 135 - 146 mmol/L   Potassium 3.9 3.5 - 5.3 mmol/L   Chloride 101 98 - 110 mmol/L   CO2 32 (H) 20 - 31 mmol/L   Glucose, Bld 106 (H) 65 - 99 mg/dL   BUN 9 7 - 25 mg/dL   Creat 0.69 0.50 - 1.10 mg/dL   Total Bilirubin 0.4 0.2 - 1.2 mg/dL   Alkaline Phosphatase 78 33 - 115 U/L   AST 8 (L) 10 - 35 U/L   ALT 8 6 - 29 U/L   Total Protein 6.8 6.1 - 8.1 g/dL   Albumin 3.6 3.6 - 5.1 g/dL   Calcium 9.5 8.6 - 10.2 mg/dL   GFR, Est African American >89 >=60 mL/min   GFR, Est Non African American >89 >=60 mL/min  TSH     Status: None   Collection Time: 06/13/16 12:31 PM  Result Value Ref Range   TSH 3.12 mIU/L    Comment:   Reference Range   > or = 20 Years  0.40-4.50   Pregnancy Range First trimester  0.26-2.66 Second trimester 0.55-2.73 Third trimester  0.43-2.91     Basic metabolic panel     Status: Abnormal   Collection Time: 06/13/16  1:02 PM  Result Value Ref Range   Sodium 137 135 -  145 mmol/L   Potassium 3.4 (L) 3.5 - 5.1 mmol/L   Chloride 100 (L) 101 - 111 mmol/L   CO2 30 22 - 32 mmol/L   Glucose, Bld 110 (H) 65 - 99 mg/dL   BUN 9 6 - 20 mg/dL   Creatinine, Ser 0.54 0.44 - 1.00 mg/dL   Calcium 9.5 8.9 - 10.3 mg/dL   GFR calc non Af Amer >60 >60 mL/min   GFR calc Af Amer >60 >60 mL/min    Comment: (NOTE) The eGFR has been calculated using the CKD EPI equation. This calculation has not been validated in all clinical situations. eGFR's persistently <60 mL/min signify possible Chronic Kidney Disease.    Anion gap 7 5 - 15  CBC     Status: Abnormal   Collection Time: 06/13/16  1:02 PM  Result Value Ref Range   WBC 5.3 3.6 - 11.0 K/uL   RBC 4.22 3.80 - 5.20 MIL/uL   Hemoglobin 12.0 12.0 - 16.0 g/dL   HCT 36.3 35.0 - 47.0 %   MCV 86.0 80.0 - 100.0 fL   MCH 28.4 26.0 - 34.0 pg   MCHC 33.0 32.0 - 36.0 g/dL   RDW 19.8 (H) 11.5 - 14.5 %   Platelets 253 150 - 440 K/uL  Troponin I     Status: None  Collection Time: 06/13/16  1:02 PM  Result Value Ref Range   Troponin I <0.03 <0.03 ng/mL  TSH     Status: None   Collection Time: 06/13/16  1:02 PM  Result Value Ref Range   TSH 3.895 0.350 - 4.500 uIU/mL    Comment: Performed by a 3rd Generation assay with a functional sensitivity of <=0.01 uIU/mL.  T4, free     Status: None   Collection Time: 06/13/16  1:02 PM  Result Value Ref Range   Free T4 0.77 0.61 - 1.12 ng/dL    Comment: (NOTE) Biotin ingestion may interfere with free T4 tests. If the results are inconsistent with the TSH level, previous test results, or the clinical presentation, then consider biotin interference. If needed, order repeat testing after stopping biotin.   hCG, quantitative, pregnancy     Status: None   Collection Time: 06/13/16  1:02 PM  Result Value Ref Range   hCG, Beta Chain, Quant, S 1 <5 mIU/mL    Comment:          GEST. AGE      CONC.  (mIU/mL)   <=1 WEEK        5 - 50     2 WEEKS       50 - 500     3 WEEKS       100 -  10,000     4 WEEKS     1,000 - 30,000     5 WEEKS     3,500 - 115,000   6-8 WEEKS     12,000 - 270,000    12 WEEKS     15,000 - 220,000        FEMALE AND NON-PREGNANT FEMALE:     LESS THAN 5 mIU/mL   Glucose, capillary     Status: Abnormal   Collection Time: 06/13/16  1:40 PM  Result Value Ref Range   Glucose-Capillary 106 (H) 65 - 99 mg/dL  Urinalysis, Complete w Microscopic     Status: Abnormal   Collection Time: 06/13/16  4:27 PM  Result Value Ref Range   Color, Urine YELLOW (A) YELLOW   APPearance HAZY (A) CLEAR   Specific Gravity, Urine 1.013 1.005 - 1.030   pH 6.0 5.0 - 8.0   Glucose, UA NEGATIVE NEGATIVE mg/dL   Hgb urine dipstick MODERATE (A) NEGATIVE   Bilirubin Urine NEGATIVE NEGATIVE   Ketones, ur NEGATIVE NEGATIVE mg/dL   Protein, ur NEGATIVE NEGATIVE mg/dL   Nitrite POSITIVE (A) NEGATIVE   Leukocytes, UA TRACE (A) NEGATIVE   RBC / HPF 0-5 0 - 5 RBC/hpf   WBC, UA 0-5 0 - 5 WBC/hpf   Bacteria, UA MANY (A) NONE SEEN   Squamous Epithelial / LPF 0-5 (A) NONE SEEN   Mucous PRESENT   Troponin I     Status: None   Collection Time: 06/13/16  4:27 PM  Result Value Ref Range   Troponin I <0.03 <0.03 ng/mL  Basic metabolic panel     Status: Abnormal   Collection Time: 06/18/16  9:36 AM  Result Value Ref Range   Sodium 136 135 - 145 mmol/L   Potassium 3.4 (L) 3.5 - 5.1 mmol/L   Chloride 100 (L) 101 - 111 mmol/L   CO2 30 22 - 32 mmol/L   Glucose, Bld 114 (H) 65 - 99 mg/dL   BUN 13 6 - 20 mg/dL   Creatinine, Ser 0.86 0.44 - 1.00 mg/dL   Calcium 9.2 8.9 -  10.3 mg/dL   GFR calc non Af Amer >60 >60 mL/min   GFR calc Af Amer >60 >60 mL/min    Comment: (NOTE) The eGFR has been calculated using the CKD EPI equation. This calculation has not been validated in all clinical situations. eGFR's persistently <60 mL/min signify possible Chronic Kidney Disease.    Anion gap 6 5 - 15  CBC     Status: Abnormal   Collection Time: 06/18/16  9:36 AM  Result Value Ref Range   WBC  5.5 3.6 - 11.0 K/uL   RBC 4.46 3.80 - 5.20 MIL/uL   Hemoglobin 12.5 12.0 - 16.0 g/dL   HCT 38.1 35.0 - 47.0 %   MCV 85.4 80.0 - 100.0 fL   MCH 28.0 26.0 - 34.0 pg   MCHC 32.8 32.0 - 36.0 g/dL   RDW 19.4 (H) 11.5 - 14.5 %   Platelets 247 150 - 440 K/uL  Troponin I     Status: None   Collection Time: 06/18/16  9:56 AM  Result Value Ref Range   Troponin I <0.03 <0.03 ng/mL  APTT     Status: None   Collection Time: 06/18/16  9:56 AM  Result Value Ref Range   aPTT 27 24 - 36 seconds  Urinalysis, Complete w Microscopic     Status: Abnormal   Collection Time: 06/18/16  2:20 PM  Result Value Ref Range   Color, Urine YELLOW (A) YELLOW   APPearance HAZY (A) CLEAR   Specific Gravity, Urine 1.012 1.005 - 1.030   pH 6.0 5.0 - 8.0   Glucose, UA NEGATIVE NEGATIVE mg/dL   Hgb urine dipstick MODERATE (A) NEGATIVE   Bilirubin Urine NEGATIVE NEGATIVE   Ketones, ur NEGATIVE NEGATIVE mg/dL   Protein, ur NEGATIVE NEGATIVE mg/dL   Nitrite NEGATIVE NEGATIVE   Leukocytes, UA SMALL (A) NEGATIVE   RBC / HPF 0-5 0 - 5 RBC/hpf   WBC, UA 6-30 0 - 5 WBC/hpf   Bacteria, UA RARE (A) NONE SEEN   Squamous Epithelial / LPF 6-30 (A) NONE SEEN   Mucous PRESENT   Urine culture     Status: Abnormal   Collection Time: 06/18/16  2:20 PM  Result Value Ref Range   Specimen Description URINE, RANDOM    Special Requests NONE    Culture MULTIPLE SPECIES PRESENT, SUGGEST RECOLLECTION (A)    Report Status 06/20/2016 FINAL   Fibrin derivatives D-Dimer ([facility] only)     Status: None   Collection Time: 06/20/16  1:42 PM  Result Value Ref Range   Fibrin derivatives D-dimer ([facility]) 386.70 0.00 - 499.00    Comment: (NOTE) <> Exclusion of Venous Thromboembolism (VTE) - OUTPATIENT ONLY   (Emergency Department or [place])   0-499 ng/ml (FEU): With a low to intermediate pretest probability                      for VTE this test result excludes the diagnosis                      of VTE.   >499 ng/ml (FEU) : VTE not excluded;  additional work up for VTE is                      required. <> Testing on Inpatients and Evaluation of Disseminated Intravascular   Coagulation ([facility]) Reference Range:   0-499 ng/ml (FEU)   Basic metabolic panel     Status: None  Collection Time: 06/30/16  7:48 PM  Result Value Ref Range   Sodium 139 135 - 145 mmol/L   Potassium 3.6 3.5 - 5.1 mmol/L   Chloride 104 101 - 111 mmol/L   CO2 26 22 - 32 mmol/L   Glucose, Bld 99 65 - 99 mg/dL   BUN 10 6 - 20 mg/dL   Creatinine, Ser 0.78 0.44 - 1.00 mg/dL   Calcium 9.2 8.9 - 10.3 mg/dL   GFR calc non Af Amer >60 >60 mL/min   GFR calc Af Amer >60 >60 mL/min    Comment: (NOTE) The eGFR has been calculated using the CKD EPI equation. This calculation has not been validated in all clinical situations. eGFR's persistently <60 mL/min signify possible Chronic Kidney Disease.    Anion gap 9 5 - 15  CBC     Status: Abnormal   Collection Time: 06/30/16  7:48 PM  Result Value Ref Range   WBC 6.0 3.6 - 11.0 K/uL   RBC 4.15 3.80 - 5.20 MIL/uL   Hemoglobin 12.1 12.0 - 16.0 g/dL   HCT 36.3 35.0 - 47.0 %   MCV 87.4 80.0 - 100.0 fL   MCH 29.1 26.0 - 34.0 pg   MCHC 33.4 32.0 - 36.0 g/dL   RDW 18.6 (H) 11.5 - 14.5 %   Platelets 227 150 - 440 K/uL  Troponin I     Status: None   Collection Time: 06/30/16  7:48 PM  Result Value Ref Range   Troponin I <0.03 <0.03 ng/mL  Urinalysis, Complete w Microscopic     Status: Abnormal   Collection Time: 07/01/16 12:00 AM  Result Value Ref Range   Color, Urine YELLOW (A) YELLOW   APPearance HAZY (A) CLEAR   Specific Gravity, Urine 1.024 1.005 - 1.030   pH 5.0 5.0 - 8.0   Glucose, UA NEGATIVE NEGATIVE mg/dL   Hgb urine dipstick MODERATE (A) NEGATIVE   Bilirubin Urine NEGATIVE NEGATIVE   Ketones, ur 20 (A) NEGATIVE mg/dL   Protein, ur NEGATIVE NEGATIVE mg/dL   Nitrite NEGATIVE NEGATIVE   Leukocytes, UA TRACE (A) NEGATIVE   RBC / HPF 0-5 0 - 5 RBC/hpf   WBC, UA 6-30 0 - 5 WBC/hpf   Bacteria, UA RARE (A)  NONE SEEN   Squamous Epithelial / LPF 6-30 (A) NONE SEEN   Mucous PRESENT   Urine Culture     Status: Abnormal   Collection Time: 07/01/16 12:00 AM  Result Value Ref Range   Specimen Description URINE, RANDOM    Special Requests Normal    Culture MULTIPLE SPECIES PRESENT, SUGGEST RECOLLECTION (A)    Report Status 07/02/2016 FINAL   TSH     Status: Abnormal   Collection Time: 07/01/16  3:50 AM  Result Value Ref Range   TSH 6.699 (H) 0.350 - 4.500 uIU/mL    Comment: Performed by a 3rd Generation assay with a functional sensitivity of <=0.01 uIU/mL.  Troponin I     Status: None   Collection Time: 07/01/16  3:50 AM  Result Value Ref Range   Troponin I <0.03 <0.03 ng/mL  Troponin I     Status: None   Collection Time: 07/01/16 10:08 AM  Result Value Ref Range   Troponin I <0.03 <0.03 ng/mL  Troponin I     Status: None   Collection Time: 07/01/16  4:19 PM  Result Value Ref Range   Troponin I <0.03 <0.03 ng/mL  ECHOCARDIOGRAM COMPLETE     Status: Abnormal  Collection Time: 07/02/16  8:06 PM  Result Value Ref Range   Weight 5,580.8 oz   Height 67 in   BP 146/84 mmHg   LV PW d 15.4 (A) 0.6 - 1.1 mm   FS 31 28 - 44 %   LA vol 58.2 mL   LA ID, A-P, ES 41 mm   IVS/LV PW RATIO, ED .59    LV e' LATERAL 10.1 cm/s   LV E/e' medial 8.66    LV E/e'average 8.66    LA diam index 1.45 cm/m2   LA vol A4C 54.2 ml   Area-P 1/2 3.79 cm2   E decel time 197 msec   Peak grad 3 mmHg   E/e' ratio 8.66    MV pk E vel 87.5 m/s   P 1/2 time 58 ms   MV pk A vel 105 m/s   LA vol index 20.6 mL/m2   MV Dec 197    LA diam end sys 41.00 mm   TDI e' medial 5.98    TDI e' lateral 10.10    Lateral S' vel 15.30 cm/sec   TAPSE 41.20 mm  CBC     Status: Abnormal   Collection Time: 07/24/16 11:31 AM  Result Value Ref Range   WBC 5.7 3.6 - 11.0 K/uL   RBC 3.96 3.80 - 5.20 MIL/uL   Hemoglobin 11.7 (L) 12.0 - 16.0 g/dL   HCT 34.9 (L) 35.0 - 47.0 %   MCV 88.2 80.0 - 100.0 fL   MCH 29.6 26.0 - 34.0 pg    MCHC 33.6 32.0 - 36.0 g/dL   RDW 17.3 (H) 11.5 - 14.5 %   Platelets 252 150 - 440 K/uL  Comprehensive metabolic panel     Status: Abnormal   Collection Time: 07/24/16 11:31 AM  Result Value Ref Range   Sodium 139 135 - 145 mmol/L   Potassium 3.6 3.5 - 5.1 mmol/L   Chloride 104 101 - 111 mmol/L   CO2 27 22 - 32 mmol/L   Glucose, Bld 115 (H) 65 - 99 mg/dL   BUN 9 6 - 20 mg/dL   Creatinine, Ser 0.69 0.44 - 1.00 mg/dL   Calcium 9.4 8.9 - 10.3 mg/dL   Total Protein 7.6 6.5 - 8.1 g/dL   Albumin 3.5 3.5 - 5.0 g/dL   AST 13 (L) 15 - 41 U/L   ALT 11 (L) 14 - 54 U/L   Alkaline Phosphatase 76 38 - 126 U/L   Total Bilirubin 0.8 0.3 - 1.2 mg/dL   GFR calc non Af Amer >60 >60 mL/min   GFR calc Af Amer >60 >60 mL/min    Comment: (NOTE) The eGFR has been calculated using the CKD EPI equation. This calculation has not been validated in all clinical situations. eGFR's persistently <60 mL/min signify possible Chronic Kidney Disease.    Anion gap 8 5 - 15  TSH     Status: None   Collection Time: 07/24/16 11:31 AM  Result Value Ref Range   TSH 2.789 0.350 - 4.500 uIU/mL    Comment: Performed by a 3rd Generation assay with a functional sensitivity of <=0.01 uIU/mL.  Troponin I     Status: None   Collection Time: 07/24/16 11:31 AM  Result Value Ref Range   Troponin I <0.03 <0.03 ng/mL  5 HIAA, quantitative, Urine, 24 hour     Status: None   Collection Time: 08/01/16  3:15 PM  Result Value Ref Range   5-HIAA, 24 Hr  Urine 1.2 <=6.0 mg/24 h    Comment: This specimen was submitted with a pH greater than 5.0.  Optimum pH for this assay is 1.0-5.0. Improper preservation may compromise the validity of the assay. This test was developed and its analytical performance characteristics have been determined by [place], [place]. It has not been cleared or approved by the U.S. Food and Drug Administration. This assay has been validated pursuant to the CLIA  regulations and is used for clinical purposes.      PHQ2/9: Depression screen [place] [facility] 2/9 06/22/2016 12/20/2015 09/02/2015 03/04/2015 12/03/2014  Decreased Interest 0 0 0 0 0  Down, Depressed, Hopeless 0 0 0 0 0  PHQ - 2 Score 0 0 0 0 0     Fall Risk: Fall Risk  06/22/2016 05/03/2016 12/20/2015 09/02/2015 03/04/2015  Falls in the past year? No No No No No     Assessment & Plan  1. Adrenal nodule (HCC)  - Catecholamines, Frac w/VMA, 24H Ur  2. Lightheadedness  - 5 HIAA w/Creatinine, 24 hr.  3. Uncontrolled hypertension  - Catecholamines, Frac w/VMA, 24H Ur - 5 HIAA w/Creatinine, 24 hr. - lisinopril (PRINIVIL,ZESTRIL) 10 MG tablet; Take 1 tablet (10 mg total) by mouth daily.  Dispense: 90 tablet; Refill: 0  Doing better, but 5HIAA could not be read because of high pH, we will recheck it and also add evaluation for pheochromocytoma.

## 2016-08-08 ENCOUNTER — Telehealth: Payer: Self-pay

## 2016-08-08 NOTE — Telephone Encounter (Signed)
I called this patient to inform her that she has been scheduled to have her CT on Tuesday, August 21, 2016 at [facility]. Patient was informed to arrive 15 mins early and not to have anything to eat or drink 4 hours prior to imaging.  Patient expressed verbal understanding and said thanks.

## 2016-08-08 NOTE — Telephone Encounter (Signed)
Pt called and stated that she can not get in to see her endocrinologist until September and that she suggested a full TSH panel

## 2016-08-09 ENCOUNTER — Other Ambulatory Visit: Payer: Self-pay | Admitting: Family Medicine

## 2016-08-09 DIAGNOSIS — R51 Headache: Secondary | ICD-10-CM | POA: Diagnosis not present

## 2016-08-09 DIAGNOSIS — E041 Nontoxic single thyroid nodule: Secondary | ICD-10-CM

## 2016-08-09 DIAGNOSIS — I10 Essential (primary) hypertension: Secondary | ICD-10-CM | POA: Diagnosis not present

## 2016-08-09 DIAGNOSIS — R5383 Other fatigue: Secondary | ICD-10-CM | POA: Diagnosis not present

## 2016-08-09 DIAGNOSIS — M791 Myalgia: Secondary | ICD-10-CM | POA: Diagnosis not present

## 2016-08-09 NOTE — Telephone Encounter (Signed)
Please print the test

## 2016-08-09 NOTE — Progress Notes (Signed)
When do you want her to come back to have this checked?

## 2016-08-10 ENCOUNTER — Ambulatory Visit (INDEPENDENT_AMBULATORY_CARE_PROVIDER_SITE_OTHER): Payer: 59 | Admitting: Psychiatry

## 2016-08-10 ENCOUNTER — Other Ambulatory Visit: Payer: Self-pay | Admitting: Family Medicine

## 2016-08-10 ENCOUNTER — Encounter (HOSPITAL_COMMUNITY): Payer: Self-pay | Admitting: Psychiatry

## 2016-08-10 VITALS — BP 148/106 | HR 60 | Ht 67.0 in | Wt 325.6 lb

## 2016-08-10 DIAGNOSIS — F419 Anxiety disorder, unspecified: Secondary | ICD-10-CM

## 2016-08-10 DIAGNOSIS — F321 Major depressive disorder, single episode, moderate: Secondary | ICD-10-CM | POA: Diagnosis not present

## 2016-08-10 DIAGNOSIS — G4730 Sleep apnea, unspecified: Secondary | ICD-10-CM

## 2016-08-10 DIAGNOSIS — I2699 Other pulmonary embolism without acute cor pulmonale: Secondary | ICD-10-CM

## 2016-08-10 DIAGNOSIS — E669 Obesity, unspecified: Secondary | ICD-10-CM

## 2016-08-10 DIAGNOSIS — Z6841 Body Mass Index (BMI) 40.0 and over, adult: Secondary | ICD-10-CM

## 2016-08-10 DIAGNOSIS — I420 Dilated cardiomyopathy: Secondary | ICD-10-CM | POA: Diagnosis not present

## 2016-08-10 DIAGNOSIS — G4700 Insomnia, unspecified: Secondary | ICD-10-CM

## 2016-08-10 DIAGNOSIS — I10 Essential (primary) hypertension: Secondary | ICD-10-CM

## 2016-08-10 LAB — TSH: TSH: 2.21 mIU/L

## 2016-08-10 MED ORDER — SERTRALINE HCL 50 MG PO TABS: ORAL_TABLET | ORAL | 1 refills | Status: DC

## 2016-08-10 NOTE — Patient Instructions (Signed)
1. Start sertraline 25 mg daily for two weeks, then 50 mg daily 2. Referral to therapy 3. Return to clinic in one month for 30 mins 

## 2016-08-14 ENCOUNTER — Telehealth: Payer: Self-pay | Admitting: Family Medicine

## 2016-08-14 NOTE — Telephone Encounter (Signed)
Patient states she has been very dizzy her BP has been 164/78 and pulse 72. Informed her Dr. Ancil Boozer stated the Zoloft can cause dizziness but not affect her BP. Patient states she has been compliant with her BP medication.

## 2016-08-14 NOTE — Telephone Encounter (Signed)
Pt was seen at [facility] and was prescribed 25mg  of Zoloft (She began taking it on Friday). Would like to know if it could affect her blood pressure and blood pressure medications because she feels light headed only when she is sitting down and feel fine when moving around.

## 2016-08-14 NOTE — Telephone Encounter (Signed)
zoloft can cause dizziness, not likely to affect bp is such a low dose

## 2016-08-17 LAB — 5 HIAA W/CREATININE, 24 HR
5-HIAA: 2.9 mg/24 h (ref <=6.0)
Creatinine: 1.85 g/(24.h) (ref 0.63–2.50)
TOTAL VOLUME 5 HIAA W/ CREATININE: 750 mL

## 2016-08-21 ENCOUNTER — Other Ambulatory Visit: Payer: Self-pay | Admitting: Family Medicine

## 2016-08-21 ENCOUNTER — Ambulatory Visit: Admission: RE | Admit: 2016-08-21 | Payer: 59 | Source: Ambulatory Visit

## 2016-08-21 DIAGNOSIS — I10 Essential (primary) hypertension: Secondary | ICD-10-CM

## 2016-08-21 LAB — CATECHOLAMINES, FRAC W/VMA, 24H UR
CALC TOTAL (E+NE): 87 ug/(24.h) (ref 26–121)
CREATININE, 24H, UR: 1.82 g/(24.h) (ref 0.63–2.50)
Dopamine: 534 mcg/24 h — ABNORMAL HIGH (ref 52–480)
Epinephrine, 24H, Ur: 4 mcg/24 h (ref 2–24)
Norepinephrine, 24H, Ur: 83 mcg/24 h (ref 15–100)
TOTAL VOLUME: 750 mL
Vanillylmandelic Acid: 4.7 mg/24 h

## 2016-08-22 ENCOUNTER — Other Ambulatory Visit: Payer: Self-pay

## 2016-08-22 DIAGNOSIS — I10 Essential (primary) hypertension: Secondary | ICD-10-CM

## 2016-08-23 ENCOUNTER — Other Ambulatory Visit: Payer: Self-pay | Admitting: Family Medicine

## 2016-08-23 DIAGNOSIS — I10 Essential (primary) hypertension: Secondary | ICD-10-CM

## 2016-08-25 ENCOUNTER — Other Ambulatory Visit: Payer: Self-pay | Admitting: Family Medicine

## 2016-08-25 DIAGNOSIS — I10 Essential (primary) hypertension: Secondary | ICD-10-CM

## 2016-08-27 ENCOUNTER — Other Ambulatory Visit: Payer: Self-pay | Admitting: Family Medicine

## 2016-08-27 ENCOUNTER — Ambulatory Visit: Payer: 59

## 2016-08-27 ENCOUNTER — Telehealth: Payer: Self-pay

## 2016-08-27 DIAGNOSIS — I10 Essential (primary) hypertension: Secondary | ICD-10-CM

## 2016-08-27 MED ORDER — LISINOPRIL 20 MG PO TABS: 20.0000 mg | ORAL_TABLET | Freq: Every day | ORAL | 0 refills | Status: DC

## 2016-08-27 NOTE — Telephone Encounter (Signed)
Madeline Dominguez called patient, she has been taking two 10 mg in am's, so we will send rx for 20 mg to take one daily

## 2016-08-27 NOTE — Telephone Encounter (Signed)
Patient has a question about the Lisinopril. Her last bottle last month stated for her to take 1 pill twice daily and this new prescription now states once daily. Please clarify how patient is suppose to take Lisinopril. Thanks.

## 2016-08-28 ENCOUNTER — Ambulatory Visit: Payer: 59 | Admitting: Gastroenterology

## 2016-08-29 ENCOUNTER — Ambulatory Visit (HOSPITAL_COMMUNITY): Payer: 59 | Admitting: Licensed Clinical Social Worker

## 2016-09-04 ENCOUNTER — Ambulatory Visit (INDEPENDENT_AMBULATORY_CARE_PROVIDER_SITE_OTHER): Payer: 59 | Admitting: Family Medicine

## 2016-09-04 ENCOUNTER — Ambulatory Visit
Admission: RE | Admit: 2016-09-04 | Discharge: 2016-09-04 | Disposition: A | Discharge status: Home / Self Care | Payer: Commercial Managed Care - HMO | Source: Ambulatory Visit | Attending: Family Medicine | Admitting: Family Medicine

## 2016-09-04 ENCOUNTER — Encounter: Payer: Self-pay | Admitting: Family Medicine

## 2016-09-04 VITALS — BP 132/78 | HR 80 | Temp 98.0°F | Resp 18 | Ht 67.0 in | Wt 326.2 lb

## 2016-09-04 DIAGNOSIS — I10 Essential (primary) hypertension: Secondary | ICD-10-CM

## 2016-09-04 DIAGNOSIS — R42 Dizziness and giddiness: Secondary | ICD-10-CM

## 2016-09-04 DIAGNOSIS — D3502 Benign neoplasm of left adrenal gland: Secondary | ICD-10-CM | POA: Insufficient documentation

## 2016-09-04 DIAGNOSIS — F321 Major depressive disorder, single episode, moderate: Secondary | ICD-10-CM | POA: Diagnosis not present

## 2016-09-04 DIAGNOSIS — R630 Anorexia: Secondary | ICD-10-CM | POA: Diagnosis not present

## 2016-09-04 DIAGNOSIS — R1013 Epigastric pain: Secondary | ICD-10-CM | POA: Diagnosis not present

## 2016-09-04 DIAGNOSIS — E279 Disorder of adrenal gland, unspecified: Secondary | ICD-10-CM

## 2016-09-04 DIAGNOSIS — E278 Other specified disorders of adrenal gland: Secondary | ICD-10-CM

## 2016-09-04 DIAGNOSIS — R634 Abnormal weight loss: Secondary | ICD-10-CM | POA: Insufficient documentation

## 2016-09-04 MED ORDER — IOPAMIDOL (ISOVUE-370) INJECTION 76%
125.0000 mL | Freq: Once | INTRAVENOUS | Status: AC | PRN
  Administered 2016-09-04: 125 mL via INTRAVENOUS

## 2016-09-04 NOTE — Progress Notes (Signed)
Name: Madeline Dominguez   MRN: [id number]    DOB: [birth year]   Date:09/04/2016       Progress Note  Subjective  Chief Complaint  Chief Complaint  Patient presents with  . Follow-up  . nodule    adrenal  . Dizziness    pt stated that she still has episodes 2-3 times daily    HPI  She has been feeling sick since end of May 2018, today I found out that her husband was incarcerated April 2018 - for breaking an entry and stealing. It started with SOB and bp going up, followed by a cough. She returned for follow mid June with dizziness, weakness and bp was spiking. We tried multiple bp medications and it was causing worsening of dizziness, and bp was been going up and down. She was evaluated by cardiologist ( Dr. Caryl Comes) and nephrologist ( Dr. Holley Raring), cleared in terms of cardiac rhythm disorder, also negative cortisol and aldosterone levels.  Currently on Norvasc 5 mg and Lisinopril ( down to 10 mg daily), she states SOB has resolved.  She is poor historian, but daughter was present during one of her visits and  told me that she also had leg weakness. facial flushing, and sometimes some burning sensation on arms and upper back, lack of appetite, upper abdominal pain ( described as aching , bloating sensation), change in bowel movements now is just constipation, no lose stools in between. Denies blood in stools. No nausea or vomiting, but continues to get dizzy, fatigue, and intermittent palpitation. She will see Endo in September. We checked 5HIAA on her last visit but ph of urine was high and we will repeat it, we also checked for  pheochromocytoma and one of the levels was elevated in urine, we will recheck serum today. Since seen by psychiatrist she has been taking Zoloft and states feeling better emotionally, but still gets afraid of staying home and having recurrence of dizzy spells. Referral to ENT had been made but no appointment was given to her, we will check it. Explained that her symptoms may be  secondary to anxiety/depression   Patient Active Problem List   Diagnosis Date Noted  . Current moderate episode of major depressive disorder without prior episode ([place]) 08/10/2016  . 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 History of radioactive iodine thyroid ablation   . 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 Right leg DVT ([place]) 08/01/2015  . Iron deficiency anemia 07/27/2015  . B12 deficiency 07/27/2015  . Bilateral pulmonary embolism ([place]) 07/25/2015  . Adrenal nodule ([place]) 07/25/2015  . Pulmonary nodules/lesions, multiple 07/25/2015  . OSA (obstructive sleep apnea) 05/18/2015  . Thyroid nodule 12/17/2014  . Heavy menstrual period 09/02/2014  . Gastroesophageal reflux disease without esophagitis 09/02/2014  . Extreme obesity 06/22/2014  . Large breasts 06/22/2014  . Anemia, iron deficiency 06/22/2014  . Uncontrolled hypertension 06/22/2014  . H/O hyperthyroidism 06/22/2014  . Right ventricular dilation 06/22/2014  . 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    Past Surgical History:  Procedure Laterality Date  . CESAREAN SECTION      Family History  Problem Relation Age of Onset  . CVA Father     Social History   Social History  . Marital status: Married    Spouse name: N/A  . Number of children: N/A  . Years of education: N/A   Occupational History  . Not on file.   Social History Main  Topics  . Smoking status: Never Smoker  . Smokeless tobacco: Never Used  . Alcohol use No     Comment: rarely  . Drug use: No  . Sexual activity: Yes    Partners: Male    Birth control/ protection: Condom   Other Topics Concern  . Not on file   Social History Narrative   Lives with 3 children, husband was incarcerated April 2018 - for stealing, she has not been keeping in touch with him because she is disappointed. 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      Current Outpatient Prescriptions:  .  amLODipine (NORVASC) 5 MG tablet, Take 1 tablet by mouth daily., Disp: , Rfl:  .  cyclobenzaprine (FLEXERIL) 10 MG tablet, Take 10 mg by mouth 3 (three) times daily as needed for muscle spasms. , Disp: , Rfl:  .  ferrous sulfate 325 (65 FE) MG tablet, Take 1 tablet (325 mg total) by mouth 2 (two) times daily with a meal., Disp: 180 tablet, Rfl: 1 .  lisinopril (PRINIVIL,ZESTRIL) 20 MG tablet, Take 1 tablet (20 mg total) by mouth daily., Disp: 90 tablet, Rfl: 0 .  omeprazole (PRILOSEC) 40 MG capsule, Take 1 capsule (40 mg total) by mouth daily., Disp: 30 capsule, Rfl: 3 .  sertraline (ZOLOFT) 50 MG tablet, Start 25 mg daily for two weeks, then 50 mg daily, Disp: 30 tablet, Rfl: 1 .  vitamin B-12 1000 MCG tablet, Take 1 tablet (1,000 mcg total) by mouth daily., Disp: 30 tablet, Rfl: 0  Allergies  Allergen Reactions  . Aspirin Nausea And Vomiting  . Hctz [Hydrochlorothiazide] Other (See Comments)    Nausea, cramping     ROS  Constitutional: Negative for fever or weight change.  Respiratory: Negative for cough and shortness of breath.   Cardiovascular: Negative for chest pain but has intermittent palpitations.  Gastrointestinal: Positive  For intermittent  abdominal pain, and  bowel changes.  Musculoskeletal: Negative for gait problem or joint swelling.  Skin: Negative for rash.  Neurological: Positive  for dizziness but no  headache.  No other specific complaints in a complete review of systems (except as listed in HPI above).  Objective  Vitals:   09/04/16 1040 09/04/16 1110  BP: 132/78   Pulse: (!) 106 80  Resp: 18   Temp: 98 F (36.7 C)   SpO2: 93%   Weight: (!) 326 lb 3 oz (148 kg)   Height: 5' 7"  (1.702 m)     Body mass index is 51.09 kg/m.  Physical Exam  Constitutional: Patient appears well-developed and well-nourished. Obese  No distress.  HEENT: head atraumatic, normocephalic, pupils  equal and reactive to light, ears normal TM bilaterally, neck supple, throat within normal limits Cardiovascular: Normal rate, regular rhythm and normal heart sounds.  No murmur heard. No BLE edema. Pulmonary/Chest: Effort normal and breath sounds normal. No respiratory distress. Abdominal: Soft.  There is no tenderness. Psychiatric: Patient has a depressed mood. behavior is normal. Judgment and thought content normal.  Recent Results (from the past 2160 hour(s))  COMPLETE METABOLIC PANEL WITH GFR     Status: Abnormal   Collection Time: 06/13/16 12:31 PM  Result Value Ref Range   Sodium 140 135 - 146 mmol/L   Potassium 3.9 3.5 - 5.3 mmol/L   Chloride 101 98 - 110 mmol/L   CO2 32 (H) 20 - 31 mmol/L   Glucose, Bld 106 (H) 65 - 99 mg/dL  BUN 9 7 - 25 mg/dL   Creat 0.69 0.50 - 1.10 mg/dL   Total Bilirubin 0.4 0.2 - 1.2 mg/dL   Alkaline Phosphatase 78 33 - 115 U/L   AST 8 (L) 10 - 35 U/L   ALT 8 6 - 29 U/L   Total Protein 6.8 6.1 - 8.1 g/dL   Albumin 3.6 3.6 - 5.1 g/dL   Calcium 9.5 8.6 - 10.2 mg/dL   GFR, Est African American >89 >=60 mL/min   GFR, Est Non African American >89 >=60 mL/min  TSH     Status: None   Collection Time: 06/13/16 12:31 PM  Result Value Ref Range   TSH 3.12 mIU/L    Comment:   Reference Range   > or = 20 Years  0.40-4.50   Pregnancy Range First trimester  0.26-2.66 Second trimester 0.55-2.73 Third trimester  0.43-2.91     Basic metabolic panel     Status: Abnormal   Collection Time: 06/13/16  1:02 PM  Result Value Ref Range   Sodium 137 135 - 145 mmol/L   Potassium 3.4 (L) 3.5 - 5.1 mmol/L   Chloride 100 (L) 101 - 111 mmol/L   CO2 30 22 - 32 mmol/L   Glucose, Bld 110 (H) 65 - 99 mg/dL   BUN 9 6 - 20 mg/dL   Creatinine, Ser 0.54 0.44 - 1.00 mg/dL   Calcium 9.5 8.9 - 10.3 mg/dL   GFR calc non Af Amer >60 >60 mL/min   GFR calc Af Amer >60 >60 mL/min    Comment: (NOTE) The eGFR has been calculated using the CKD EPI equation. This calculation  has not been validated in all clinical situations. eGFR's persistently <60 mL/min signify possible Chronic Kidney Disease.    Anion gap 7 5 - 15  CBC     Status: Abnormal   Collection Time: 06/13/16  1:02 PM  Result Value Ref Range   WBC 5.3 3.6 - 11.0 K/uL   RBC 4.22 3.80 - 5.20 MIL/uL   Hemoglobin 12.0 12.0 - 16.0 g/dL   HCT 36.3 35.0 - 47.0 %   MCV 86.0 80.0 - 100.0 fL   MCH 28.4 26.0 - 34.0 pg   MCHC 33.0 32.0 - 36.0 g/dL   RDW 19.8 (H) 11.5 - 14.5 %   Platelets 253 150 - 440 K/uL  Troponin I     Status: None   Collection Time: 06/13/16  1:02 PM  Result Value Ref Range   Troponin I <0.03 <0.03 ng/mL  TSH     Status: None   Collection Time: 06/13/16  1:02 PM  Result Value Ref Range   TSH 3.895 0.350 - 4.500 uIU/mL    Comment: Performed by a 3rd Generation assay with a functional sensitivity of <=0.01 uIU/mL.  T4, free     Status: None   Collection Time: 06/13/16  1:02 PM  Result Value Ref Range   Free T4 0.77 0.61 - 1.12 ng/dL    Comment: (NOTE) Biotin ingestion may interfere with free T4 tests. If the results are inconsistent with the TSH level, previous test results, or the clinical presentation, then consider biotin interference. If needed, order repeat testing after stopping biotin.   hCG, quantitative, pregnancy     Status: None   Collection Time: 06/13/16  1:02 PM  Result Value Ref Range   hCG, Beta Chain, Quant, S 1 <5 mIU/mL    Comment:          GEST. AGE  CONC.  (mIU/mL)   <=1 WEEK        5 - 50     2 WEEKS       50 - 500     3 WEEKS       100 - 10,000     4 WEEKS     1,000 - 30,000     5 WEEKS     3,500 - 115,000   6-8 WEEKS     12,000 - 270,000    12 WEEKS     15,000 - 220,000        FEMALE AND NON-PREGNANT FEMALE:     LESS THAN 5 mIU/mL   Glucose, capillary     Status: Abnormal   Collection Time: 06/13/16  1:40 PM  Result Value Ref Range   Glucose-Capillary 106 (H) 65 - 99 mg/dL  Urinalysis, Complete w Microscopic     Status: Abnormal    Collection Time: 06/13/16  4:27 PM  Result Value Ref Range   Color, Urine YELLOW (A) YELLOW   APPearance HAZY (A) CLEAR   Specific Gravity, Urine 1.013 1.005 - 1.030   pH 6.0 5.0 - 8.0   Glucose, UA NEGATIVE NEGATIVE mg/dL   Hgb urine dipstick MODERATE (A) NEGATIVE   Bilirubin Urine NEGATIVE NEGATIVE   Ketones, ur NEGATIVE NEGATIVE mg/dL   Protein, ur NEGATIVE NEGATIVE mg/dL   Nitrite POSITIVE (A) NEGATIVE   Leukocytes, UA TRACE (A) NEGATIVE   RBC / HPF 0-5 0 - 5 RBC/hpf   WBC, UA 0-5 0 - 5 WBC/hpf   Bacteria, UA MANY (A) NONE SEEN   Squamous Epithelial / LPF 0-5 (A) NONE SEEN   Mucus PRESENT   Troponin I     Status: None   Collection Time: 06/13/16  4:27 PM  Result Value Ref Range   Troponin I <0.03 <0.03 ng/mL  Basic metabolic panel     Status: Abnormal   Collection Time: 06/18/16  9:36 AM  Result Value Ref Range   Sodium 136 135 - 145 mmol/L   Potassium 3.4 (L) 3.5 - 5.1 mmol/L   Chloride 100 (L) 101 - 111 mmol/L   CO2 30 22 - 32 mmol/L   Glucose, Bld 114 (H) 65 - 99 mg/dL   BUN 13 6 - 20 mg/dL   Creatinine, Ser 0.86 0.44 - 1.00 mg/dL   Calcium 9.2 8.9 - 10.3 mg/dL   GFR calc non Af Amer >60 >60 mL/min   GFR calc Af Amer >60 >60 mL/min    Comment: (NOTE) The eGFR has been calculated using the CKD EPI equation. This calculation has not been validated in all clinical situations. eGFR's persistently <60 mL/min signify possible Chronic Kidney Disease.    Anion gap 6 5 - 15  CBC     Status: Abnormal   Collection Time: 06/18/16  9:36 AM  Result Value Ref Range   WBC 5.5 3.6 - 11.0 K/uL   RBC 4.46 3.80 - 5.20 MIL/uL   Hemoglobin 12.5 12.0 - 16.0 g/dL   HCT 38.1 35.0 - 47.0 %   MCV 85.4 80.0 - 100.0 fL   MCH 28.0 26.0 - 34.0 pg   MCHC 32.8 32.0 - 36.0 g/dL   RDW 19.4 (H) 11.5 - 14.5 %   Platelets 247 150 - 440 K/uL  Troponin I     Status: None   Collection Time: 06/18/16  9:56 AM  Result Value Ref Range   Troponin I <0.03 <0.03 ng/mL  APTT  Status: None    Collection Time: 06/18/16  9:56 AM  Result Value Ref Range   aPTT 27 24 - 36 seconds  Urinalysis, Complete w Microscopic     Status: Abnormal   Collection Time: 06/18/16  2:20 PM  Result Value Ref Range   Color, Urine YELLOW (A) YELLOW   APPearance HAZY (A) CLEAR   Specific Gravity, Urine 1.012 1.005 - 1.030   pH 6.0 5.0 - 8.0   Glucose, UA NEGATIVE NEGATIVE mg/dL   Hgb urine dipstick MODERATE (A) NEGATIVE   Bilirubin Urine NEGATIVE NEGATIVE   Ketones, ur NEGATIVE NEGATIVE mg/dL   Protein, ur NEGATIVE NEGATIVE mg/dL   Nitrite NEGATIVE NEGATIVE   Leukocytes, UA SMALL (A) NEGATIVE   RBC / HPF 0-5 0 - 5 RBC/hpf   WBC, UA 6-30 0 - 5 WBC/hpf   Bacteria, UA RARE (A) NONE SEEN   Squamous Epithelial / LPF 6-30 (A) NONE SEEN   Mucus PRESENT   Urine culture     Status: Abnormal   Collection Time: 06/18/16  2:20 PM  Result Value Ref Range   Specimen Description URINE, RANDOM    Special Requests NONE    Culture MULTIPLE SPECIES PRESENT, SUGGEST RECOLLECTION (A)    Report Status 06/20/2016 FINAL   Fibrin derivatives D-Dimer ([facility] only)     Status: None   Collection Time: 06/20/16  1:42 PM  Result Value Ref Range   Fibrin derivatives D-dimer ([facility]) 386.70 0.00 - 499.00    Comment: (NOTE) <> Exclusion of Venous Thromboembolism (VTE) - OUTPATIENT ONLY   (Emergency Department or [place])   0-499 ng/ml (FEU): With a low to intermediate pretest probability                      for VTE this test result excludes the diagnosis                      of VTE.   >499 ng/ml (FEU) : VTE not excluded; additional work up for VTE is                      required. <> Testing on Inpatients and Evaluation of Disseminated Intravascular   Coagulation ([facility]) Reference Range:   0-499 ng/ml (FEU)   Basic metabolic panel     Status: None   Collection Time: 06/30/16  7:48 PM  Result Value Ref Range   Sodium 139 135 - 145 mmol/L   Potassium 3.6 3.5 - 5.1 mmol/L   Chloride 104 101 - 111 mmol/L   CO2 26 22 - 32  mmol/L   Glucose, Bld 99 65 - 99 mg/dL   BUN 10 6 - 20 mg/dL   Creatinine, Ser 0.78 0.44 - 1.00 mg/dL   Calcium 9.2 8.9 - 10.3 mg/dL   GFR calc non Af Amer >60 >60 mL/min   GFR calc Af Amer >60 >60 mL/min    Comment: (NOTE) The eGFR has been calculated using the CKD EPI equation. This calculation has not been validated in all clinical situations. eGFR's persistently <60 mL/min signify possible Chronic Kidney Disease.    Anion gap 9 5 - 15  CBC     Status: Abnormal   Collection Time: 06/30/16  7:48 PM  Result Value Ref Range   WBC 6.0 3.6 - 11.0 K/uL   RBC 4.15 3.80 - 5.20 MIL/uL   Hemoglobin 12.1 12.0 - 16.0 g/dL   HCT 36.3 35.0 - 47.0 %  MCV 87.4 80.0 - 100.0 fL   MCH 29.1 26.0 - 34.0 pg   MCHC 33.4 32.0 - 36.0 g/dL   RDW 18.6 (H) 11.5 - 14.5 %   Platelets 227 150 - 440 K/uL  Troponin I     Status: None   Collection Time: 06/30/16  7:48 PM  Result Value Ref Range   Troponin I <0.03 <0.03 ng/mL  Urinalysis, Complete w Microscopic     Status: Abnormal   Collection Time: 07/01/16 12:00 AM  Result Value Ref Range   Color, Urine YELLOW (A) YELLOW   APPearance HAZY (A) CLEAR   Specific Gravity, Urine 1.024 1.005 - 1.030   pH 5.0 5.0 - 8.0   Glucose, UA NEGATIVE NEGATIVE mg/dL   Hgb urine dipstick MODERATE (A) NEGATIVE   Bilirubin Urine NEGATIVE NEGATIVE   Ketones, ur 20 (A) NEGATIVE mg/dL   Protein, ur NEGATIVE NEGATIVE mg/dL   Nitrite NEGATIVE NEGATIVE   Leukocytes, UA TRACE (A) NEGATIVE   RBC / HPF 0-5 0 - 5 RBC/hpf   WBC, UA 6-30 0 - 5 WBC/hpf   Bacteria, UA RARE (A) NONE SEEN   Squamous Epithelial / LPF 6-30 (A) NONE SEEN   Mucus PRESENT   Urine Culture     Status: Abnormal   Collection Time: 07/01/16 12:00 AM  Result Value Ref Range   Specimen Description URINE, RANDOM    Special Requests Normal    Culture MULTIPLE SPECIES PRESENT, SUGGEST RECOLLECTION (A)    Report Status 07/02/2016 FINAL   TSH     Status: Abnormal   Collection Time: 07/01/16  3:50 AM   Result Value Ref Range   TSH 6.699 (H) 0.350 - 4.500 uIU/mL    Comment: Performed by a 3rd Generation assay with a functional sensitivity of <=0.01 uIU/mL.  Troponin I     Status: None   Collection Time: 07/01/16  3:50 AM  Result Value Ref Range   Troponin I <0.03 <0.03 ng/mL  Troponin I     Status: None   Collection Time: 07/01/16 10:08 AM  Result Value Ref Range   Troponin I <0.03 <0.03 ng/mL  Troponin I     Status: None   Collection Time: 07/01/16  4:19 PM  Result Value Ref Range   Troponin I <0.03 <0.03 ng/mL  ECHOCARDIOGRAM COMPLETE     Status: Abnormal   Collection Time: 07/02/16  8:06 PM  Result Value Ref Range   Weight 5,580.8 oz   Height 67 in   BP 146/84 mmHg   LV PW d 15.4 (A) 0.6 - 1.1 mm   FS 31 28 - 44 %   LA vol 58.2 mL   LA ID, A-P, ES 41 mm   IVS/LV PW RATIO, ED .59    LV e' LATERAL 10.1 cm/s   LV E/e' medial 8.66    LV E/e'average 8.66    LA diam index 1.45 cm/m2   LA vol A4C 54.2 ml   Area-P 1/2 3.79 cm2   E decel time 197 msec   Peak grad 3 mmHg   E/e' ratio 8.66    MV pk E vel 87.5 m/s   P 1/2 time 58 ms   MV pk A vel 105 m/s   LA vol index 20.6 mL/m2   MV Dec 197    LA diam end sys 41.00 mm   TDI e' medial 5.98    TDI e' lateral 10.10    Lateral S' vel 15.30 cm/sec   TAPSE  41.20 mm  CBC     Status: Abnormal   Collection Time: 07/24/16 11:31 AM  Result Value Ref Range   WBC 5.7 3.6 - 11.0 K/uL   RBC 3.96 3.80 - 5.20 MIL/uL   Hemoglobin 11.7 (L) 12.0 - 16.0 g/dL   HCT 34.9 (L) 35.0 - 47.0 %   MCV 88.2 80.0 - 100.0 fL   MCH 29.6 26.0 - 34.0 pg   MCHC 33.6 32.0 - 36.0 g/dL   RDW 17.3 (H) 11.5 - 14.5 %   Platelets 252 150 - 440 K/uL  Comprehensive metabolic panel     Status: Abnormal   Collection Time: 07/24/16 11:31 AM  Result Value Ref Range   Sodium 139 135 - 145 mmol/L   Potassium 3.6 3.5 - 5.1 mmol/L   Chloride 104 101 - 111 mmol/L   CO2 27 22 - 32 mmol/L   Glucose, Bld 115 (H) 65 - 99 mg/dL   BUN 9 6 - 20 mg/dL   Creatinine,  Ser 0.69 0.44 - 1.00 mg/dL   Calcium 9.4 8.9 - 10.3 mg/dL   Total Protein 7.6 6.5 - 8.1 g/dL   Albumin 3.5 3.5 - 5.0 g/dL   AST 13 (L) 15 - 41 U/L   ALT 11 (L) 14 - 54 U/L   Alkaline Phosphatase 76 38 - 126 U/L   Total Bilirubin 0.8 0.3 - 1.2 mg/dL   GFR calc non Af Amer >60 >60 mL/min   GFR calc Af Amer >60 >60 mL/min    Comment: (NOTE) The eGFR has been calculated using the CKD EPI equation. This calculation has not been validated in all clinical situations. eGFR's persistently <60 mL/min signify possible Chronic Kidney Disease.    Anion gap 8 5 - 15  TSH     Status: None   Collection Time: 07/24/16 11:31 AM  Result Value Ref Range   TSH 2.789 0.350 - 4.500 uIU/mL    Comment: Performed by a 3rd Generation assay with a functional sensitivity of <=0.01 uIU/mL.  Troponin I     Status: None   Collection Time: 07/24/16 11:31 AM  Result Value Ref Range   Troponin I <0.03 <0.03 ng/mL  5 HIAA, quantitative, Urine, 24 hour     Status: None   Collection Time: 08/01/16  3:15 PM  Result Value Ref Range   5-HIAA, 24 Hr Urine 1.2 <=6.0 mg/24 h    Comment: This specimen was submitted with a pH greater than 5.0.  Optimum pH for this assay is 1.0-5.0. Improper preservation may compromise the validity of the assay. This test was developed and its analytical performance characteristics have been determined by [place], [place]. It has not been cleared or approved by the U.S. Food and Drug Administration. This assay has been validated pursuant to the CLIA regulations and is used for clinical purposes.   TSH     Status: None   Collection Time: 08/10/16  9:39 AM  Result Value Ref Range   TSH 2.21 mIU/L    Comment:   Reference Range   > or = 20 Years  0.40-4.50   Pregnancy Range First trimester  0.26-2.66 Second trimester 0.55-2.73 Third trimester  0.43-2.91     Catecholamines, Frac w/VMA, 24H Ur     Status: Abnormal   Collection Time: 08/10/16   9:59 AM  Result Value Ref Range   Total Volume 750 mL   Epinephrine, 24H, Ur 4 2 - 24 mcg/24 h    Comment:  This test was developed and its analytical performance characteristics have been determined by [facility]. It has not been cleared or approved by FDA. This assay has been validated pursuant to the CLIA regulations and is used for clinical purposes.    Norepinephrine, 24H, Ur 83 15 - 100 mcg/24 h    Comment:   This test was developed and its analytical performance characteristics have been determined by [facility]. It has not been cleared or approved by FDA. This assay has been validated pursuant to the CLIA regulations and is used for clinical purposes.    Calc Total (E+NE) 87 26 - 121 mcg/24 h    Comment:   This test was developed and its analytical performance characteristics have been determined by [facility]. It has not been cleared or approved by FDA. This assay has been validated pursuant to the CLIA regulations and is used for clinical purposes.    Dopamine 534 (H) 52 - 480 mcg/24 h    Comment:   This test was developed and its analytical performance characteristics have been determined by [facility]. It has not been cleared or approved by FDA. This assay has been validated pursuant to the CLIA regulations and is used for clinical purposes.    Vanillylmandelic Acid 4.7 6 OR LESS mg/24 h    Comment:   This test was developed and its analytical performance characteristics have been determined by [facility]. It has not been cleared or approved by FDA. This assay has been validated pursuant to the CLIA regulations and is used for clinical purposes.    Creatinine, 24H, Ur 1.82 0.63 - 2.50 g/24 h  5 HIAA w/Creatinine, 24 hr.     Status: None    Collection Time: 08/10/16  9:59 AM  Result Value Ref Range   Total Volume 750 mL    Comment: Please note:  [id number]                      5-HIAA 2.9 <=6.0 mg/24 h    Comment: This test was developed and its analytical performance characteristics have been determined by [place], [place]. It has not been cleared or approved by the U.S. Food and Drug Administration. This assay has been validated pursuant to the CLIA regulations and is used for clinical purposes.    Creatinine 1.85 0.63 - 2.50 g/24 h     PHQ2/9: Depression screen [facility] 2/9 06/22/2016 12/20/2015 09/02/2015 03/04/2015 12/03/2014  Decreased Interest 0 0 0 0 0  Down, Depressed, Hopeless 0 0 0 0 0  PHQ - 2 Score 0 0 0 0 0     Fall Risk: Fall Risk  06/22/2016 05/03/2016 12/20/2015 09/02/2015 03/04/2015  Falls in the past year? No No No No No     Assessment & Plan   1. Current moderate episode of major depressive disorder without prior episode ([place])  Seen by Psychiatrist and is now on Zoloft 50 mg a few weeks ago, she states she is feeling better emotionally, advised her to keep medications and follow up as scheduled.   2. Adrenal nodule ([place])  Keep follow up with endocrinologist 09/11  3. Uncontrolled hypertension  Bp is at goal today, continue follow up with nephrologist and all medications  4. Lightheadedness  For some reason she has not seen ENT yet, we  will make sure we can get it scheduled.

## 2016-09-06 NOTE — Progress Notes (Signed)
[place] MD/PA/NP OP Progress Note  09/10/2016 3:53 PM Madeline Dominguez  MRN:  [id number]  Chief Complaint:  Chief Complaint    Follow-up; Depression     HPI:  Patient presents for follow up appointment for depression. She states that she feels much better since the last appointment. She has gone out more with her children, and she enjoys it. She has more connection with her children. Although she still has some concern about her physical condition, she sees her provider and denies rumination on it. She has started to talk with her mother about her husband in jail (will be released after one year), as it makes her feel better. Although she is not back to herself yet (she is usually "open" and very sociable), she believes she is in a good direction. She reports improvement in sleep. She has more energy and appetite. She denies SI. She feels anxious at times. She denies panic attacks. She felt nausea when she started sertraline; it has been improving.    Visit Diagnosis:    ICD-10-CM   1. Current moderate episode of major depressive disorder without prior episode ([place]) F32.1     Past Psychiatric History:  I have reviewed the patient's psychiatry history in detail and updated the patient record.  Outpatient: denies Psychiatry admission: denies Previous suicide attempt: denies Past trials of medication: denies History of violence:   Past Medical History:  Past Medical History:  Diagnosis Date  . Cough 07/25/2015  . Dilated cardiomyopathy ([place])   . H/O: hypothyroidism   . Heart palpitations   . Hypertension   . Iron deficiency   . Large breasts   . Morbid obesity ([place])   . Postpartum hemorrhage   . Thyroid disease     Past Surgical History:  Procedure Laterality Date  . CESAREAN SECTION      Family Psychiatric History:  I have reviewed the patient's family history in detail and updated the patient record.  Family History:  Family History  Problem Relation Age of Onset  . CVA  Father     Social History:  Social History   Social History  . Marital status: Married    Spouse name: N/A  . Number of children: N/A  . Years of education: N/A   Social History Main Topics  . Smoking status: Never Smoker  . Smokeless tobacco: Never Used  . Alcohol use No     Comment: rarely  . Drug use: No  . Sexual activity: Yes    Partners: Male    Birth control/ protection: Condom   Other Topics Concern  . Not on file   Social History Narrative   Lives with 3 children, husband was incarcerated April 2018 - for stealing, she has not been keeping in touch with him because she is disappointed. 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    Education: 12 th grade She grew up in [place], raised by her mother who was a single parent, she has two sisters and one brother. 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 She was a single parent for 25 years.  She lives with her three children (25, 76, 14)  Allergies:  Allergies  Allergen Reactions  . Aspirin Nausea And Vomiting  . Hctz [Hydrochlorothiazide] Other (  See Comments)    Nausea, cramping    Metabolic Disorder Labs: Lab Results  Component Value Date   HGBA1C 4.9 04/27/2016   MPG 94 04/27/2016   MPG 94 12/20/2015   No results found for: PROLACTIN Lab Results  Component Value Date   CHOL 178 04/27/2016   TRIG 53 04/27/2016   HDL 68 04/27/2016   CHOLHDL 2.6 04/27/2016   VLDL 11 04/27/2016   LDLCALC 99 04/27/2016   LDLCALC 105 (H) 12/03/2014   Lab Results  Component Value Date   TSH 2.21 08/10/2016   TSH 2.789 07/24/2016    Therapeutic Level Labs: No results found for: LITHIUM No results found for: VALPROATE No components found for:  CBMZ  Current  Medications: Current Outpatient Prescriptions  Medication Sig Dispense Refill  . amLODipine (NORVASC) 5 MG tablet Take 1 tablet by mouth daily.    . cyclobenzaprine (FLEXERIL) 10 MG tablet Take 10 mg by mouth 3 (three) times daily as needed for muscle spasms.     . ferrous sulfate 325 (65 FE) MG tablet Take 1 tablet (325 mg total) by mouth 2 (two) times daily with a meal. 180 tablet 1  . lisinopril (PRINIVIL,ZESTRIL) 20 MG tablet Take 1 tablet (20 mg total) by mouth daily. 90 tablet 0  . omeprazole (PRILOSEC) 40 MG capsule Take 1 capsule (40 mg total) by mouth daily. 30 capsule 3  . sertraline (ZOLOFT) 50 MG tablet Take 1 tablet (50 mg total) by mouth daily. 30 tablet 1  . vitamin B-12 1000 MCG tablet Take 1 tablet (1,000 mcg total) by mouth daily. 30 tablet 0   No current facility-administered medications for this visit.      Musculoskeletal: Strength & Muscle Tone: within normal limits Gait & Station: normal Patient leans: N/A  Psychiatric Specialty Exam: Review of Systems  Psychiatric/Behavioral: Positive for depression. Negative for hallucinations, substance abuse and suicidal ideas. The patient is nervous/anxious and has insomnia.     Blood pressure 133/80, pulse 75, height 5' 7.01" (1.702 m), weight (!) 328 lb (148.8 kg).Body mass index is 51.36 kg/m.  General Appearance: Fairly Groomed  Eye Contact:  Good  Speech:  Clear and Coherent  Volume:  Normal  Mood:  "better"  Affect:  Appropriate and slightly restricted- significantly improved  Thought Process:  Coherent and Goal Directed  Orientation:  Full (Time, Place, and Person)  Thought Content: Logical Perceptions: denies AH/VH  Suicidal Thoughts:  No  Homicidal Thoughts:  No  Memory:  Immediate;   Good Recent;   Good Remote;   Good  Judgement:  Good  Insight:  Fair  Psychomotor Activity:  Normal  Concentration:  Concentration: Good and Attention Span: Good  Recall:  Good  Fund of Knowledge: Good  Language: Good   Akathisia:  No  Handed:  Right  AIMS (if indicated): not done  Assets:  Communication Skills Desire for Improvement  ADL's:  Intact  Cognition: WNL  Sleep:  Fair   Screenings: PHQ2-9     [facility] Visit from 06/22/2016 in [facility] Office Visit from 12/20/2015 in [facility] Office Visit from 09/02/2015 in [facility] Office Visit from 03/04/2015 in [facility] Office Visit from 12/03/2014 in [facility]  PHQ-2 Total Score  0  0  0  0  0       Assessment and Plan:  Madeline Dominguez is a 51 y.o. 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 "No clear cardiac explanation for her spells, with ongoing symptoms with normal vital signs and not attenuated by recumbency" PCP record was not available.   # MDD, moderate, single episode, without psychotic features There has been significant improvement in neurovegetative symptoms and anxiety after starting sertraline. She also demonstrates almost no  rumination on her somatic symptoms. Although she will benefit from further uptitration to optimize its effect for depression, she reports preference to stay on current dose. Will continue sertraline to target depression. Discussedin behavioral activation. She will benefit from CBT to target anxiety/depression; will make a referral.   # Insomnia Much improved as her depression improved. Discussed sleep hygiene.   Plan 1. Continue sertraline 50 mg daily 2. Referral to therapy  3. Return to clinic in one month for 30 mins 4. Obtain record  from PCP  (She was checked TSH per self report; she has an appointment with PCP in September)   The duration of this appointment visit was 30 minutes of face-to-face time with the patient.  Greater than 50% of this time was spent in counseling, explanation of  diagnosis, planning of further management, and coordination of care.  [name], MD 09/10/2016, 3:53 PM

## 2016-09-09 LAB — CATECHOLAMINES, FRACTIONATED, PLASMA
Catecholamines, Total: 551 pg/mL
EPINEPHRINE: 23 pg/mL
Norepinephrine: 528 pg/mL

## 2016-09-10 ENCOUNTER — Ambulatory Visit (INDEPENDENT_AMBULATORY_CARE_PROVIDER_SITE_OTHER): Payer: 59 | Admitting: Psychiatry

## 2016-09-10 VITALS — BP 133/80 | HR 75 | Ht 67.01 in | Wt 328.0 lb

## 2016-09-10 DIAGNOSIS — R450 Nervousness: Secondary | ICD-10-CM

## 2016-09-10 DIAGNOSIS — F419 Anxiety disorder, unspecified: Secondary | ICD-10-CM

## 2016-09-10 DIAGNOSIS — F321 Major depressive disorder, single episode, moderate: Secondary | ICD-10-CM

## 2016-09-10 DIAGNOSIS — G4700 Insomnia, unspecified: Secondary | ICD-10-CM

## 2016-09-10 MED ORDER — SERTRALINE HCL 50 MG PO TABS: 50.0000 mg | ORAL_TABLET | Freq: Every day | ORAL | 1 refills | Status: DC

## 2016-09-10 NOTE — Patient Instructions (Signed)
1. Continue sertraline 50 mg daily 2. Referral to therapy  3. Return to clinic in one month for 30 mins

## 2016-09-11 DIAGNOSIS — D3502 Benign neoplasm of left adrenal gland: Secondary | ICD-10-CM | POA: Insufficient documentation

## 2016-09-11 DIAGNOSIS — I10 Essential (primary) hypertension: Secondary | ICD-10-CM | POA: Diagnosis not present

## 2016-09-11 DIAGNOSIS — E0500 Thyrotoxicosis with diffuse goiter without thyrotoxic crisis or storm: Secondary | ICD-10-CM | POA: Diagnosis not present

## 2016-09-11 DIAGNOSIS — E0520 Thyrotoxicosis with toxic multinodular goiter without thyrotoxic crisis or storm: Secondary | ICD-10-CM | POA: Diagnosis not present

## 2016-09-11 DIAGNOSIS — D509 Iron deficiency anemia, unspecified: Secondary | ICD-10-CM | POA: Diagnosis not present

## 2016-09-17 DIAGNOSIS — D3502 Benign neoplasm of left adrenal gland: Secondary | ICD-10-CM | POA: Diagnosis not present

## 2016-10-03 ENCOUNTER — Ambulatory Visit: Payer: 59 | Admitting: Family Medicine

## 2016-10-03 ENCOUNTER — Telehealth: Payer: Self-pay

## 2016-10-03 NOTE — Telephone Encounter (Signed)
Patient calling wanting her work excuse revised to say she can return to work with no restrictions. Letter was revised and faxed to employer.

## 2016-10-03 NOTE — Progress Notes (Signed)
[place] MD/PA/NP OP Progress Note  10/08/2016 2:53 PM Madeline Dominguez  MRN:  [id number]  Chief Complaint:  Chief Complaint    Follow-up; Depression     HPI:  She presented 20 mins late for the appointment for depression. She states that she feels much better. She believes that incarceration of her husband and the incident of her middle child led her to depression. She talks about her middle child who accidentally used marijuana and who was claimed to "kidnapped" children of her co-workers. She states that it was one after another at that time. She reports good support from her mother who even watches a "sad movie" together. She started to be back to work and feels she is almost getting back to herself. She denies feeling depressed. She feels less fatigue. She feels more motivated and enjoys going shopping with her children. She denies SI. She feels anxious at times. She denies panic attacks.   Wt Readings from Last 3 Encounters:  10/08/16 (!) 332 lb (150.6 kg)  10/04/16 (!) 331 lb 8 oz (150.4 kg)  09/10/16 (!) 328 lb (148.8 kg)    Visit Diagnosis:    ICD-10-CM   1. Current moderate episode of major depressive disorder without prior episode ([place]) F32.1     Past Psychiatric History:  I have reviewed the patient's psychiatry history in detail and updated the patient record.  Outpatient: denies Psychiatry admission: denies Previous suicide attempt: denies Past trials of medication: denies History of violence:   Past Medical History:  Past Medical History:  Diagnosis Date  . Cough 07/25/2015  . Dilated cardiomyopathy ([place])   . H/O: hypothyroidism   . Heart palpitations   . Hypertension   . Iron deficiency   . Large breasts   . Morbid obesity ([place])   . Postpartum hemorrhage   . Thyroid disease     Past Surgical History:  Procedure Laterality Date  . CESAREAN SECTION      Family Psychiatric History:  I have reviewed the patient's family history in detail and updated the  patient record.  Family History:  Family History  Problem Relation Age of Onset  . CVA Father     Social History:  Social History   Social History  . Marital status: Married    Spouse name: N/A  . Number of children: N/A  . Years of education: N/A   Social History Main Topics  . Smoking status: Never Smoker  . Smokeless tobacco: Never Used  . Alcohol use No     Comment: rarely  . Drug use: No  . Sexual activity: Yes    Partners: Male    Birth control/ protection: Condom   Other Topics Concern  . Not on file   Social History Narrative   Lives with 3 children, husband was incarcerated April 2018 - for stealing, she has not been keeping in touch with him because she is disappointed. 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    Education: 12 th grade She grew up in [place], raised by her mother who was a single parent, she has two sisters and one brother. 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 She was a single parent for 25 years.  She lives with her three children (40, 31, 27)  Allergies:  Allergies  Allergen Reactions  . Aspirin Nausea And Vomiting  . Hctz [Hydrochlorothiazide] Other (See Comments)    Nausea, cramping    Metabolic Disorder Labs: Lab Results  Component Value Date   HGBA1C 4.9 04/27/2016   MPG 94 04/27/2016   MPG 94 12/20/2015   No results found for: PROLACTIN Lab Results  Component Value Date   CHOL 178 04/27/2016   TRIG 53 04/27/2016   HDL 68 04/27/2016   CHOLHDL 2.6 04/27/2016   VLDL 11 04/27/2016   LDLCALC 99 04/27/2016   LDLCALC 105 (H) 12/03/2014   Lab Results  Component Value Date   TSH 2.21 08/10/2016   TSH 2.789 07/24/2016    Therapeutic Level Labs: No results found  for: LITHIUM No results found for: VALPROATE No components found for:  CBMZ  Current Medications: Current Outpatient Prescriptions  Medication Sig Dispense Refill  . amLODipine (NORVASC) 5 MG tablet Take 1 tablet by mouth daily.    . cyclobenzaprine (FLEXERIL) 10 MG tablet Take 10 mg by mouth 3 (three) times daily as needed for muscle spasms.     . ferrous sulfate 325 (65 FE) MG tablet Take 1 tablet (325 mg total) by mouth 2 (two) times daily with a meal. 180 tablet 1  . lisinopril (PRINIVIL,ZESTRIL) 20 MG tablet Take 1 tablet (20 mg total) by mouth daily. 90 tablet 0  . omeprazole (PRILOSEC) 40 MG capsule Take 1 capsule (40 mg total) by mouth daily. (Patient not taking: Reported on 10/04/2016) 30 capsule 3  . sertraline (ZOLOFT) 50 MG tablet Take 1 tablet (50 mg total) by mouth daily. 30 tablet 1  . vitamin B-12 1000 MCG tablet Take 1 tablet (1,000 mcg total) by mouth daily. 30 tablet 0   No current facility-administered medications for this visit.      Musculoskeletal: Strength & Muscle Tone: within normal limits Gait & Station: normal Patient leans: N/A  Psychiatric Specialty Exam: Review of Systems  Psychiatric/Behavioral: Negative for depression, hallucinations, substance abuse and suicidal ideas. The patient is nervous/anxious. The patient does not have insomnia.   All other systems reviewed and are negative.   Blood pressure 120/80, pulse 72, height 5' 7.01" (1.702 m), weight (!) 332 lb (150.6 kg).Body mass index is 51.99 kg/m.  General Appearance: Fairly Groomed  Eye Contact:  Good  Speech:  Clear and Coherent  Volume:  Normal  Mood:  "better"  Affect:  Appropriate, Congruent and Full Range, smiles  Thought Process:  Coherent and Goal Directed  Orientation:  Full (Time, Place, and Person)  Thought Content: Logical  Perceptions: denies AH/VH  Suicidal Thoughts:  No  Homicidal Thoughts:  No  Memory:  Immediate;   Good Recent;   Good Remote;   Good  Judgement:  Good   Insight:  Fair  Psychomotor Activity:  Normal  Concentration:  Concentration: Good and Attention Span: Good  Recall:  Good  Fund of Knowledge: Good  Language: Good  Akathisia:  No  Handed:  Right  AIMS (if indicated): not done  Assets:  Communication Skills Desire for Improvement  ADL's:  Intact  Cognition: WNL  Sleep:  Good   Screenings: PHQ2-9     Office Visit from 10/04/2016 in [facility] Office Visit from 06/22/2016 in [facility] Office Visit from 12/20/2015 in [facility] Office Visit from 09/02/2015 in [facility] Office Visit from 03/04/2015 in [facility]  PHQ-2 Total Score  0  0  0  0  0       Assessment and Plan:  JANIYA MILLIRONS is a 51 y.o. 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 "No clear cardiac explanation for her spells, with ongoing symptoms with normal vital signs and not attenuated by recumbency" PCP record was not available.   # MDD, moderate, recurrent without psychotic features Exam is notable for  Improved affect and patient reports significant improvement in neurovegetative symptoms. Will continue sertraline to target depression. Discussed behavioral activation. She will greatly benefit from CBT; referral is made.   Plan 1. Continue sertraline 50 mg daily 2. Referral to therapy 3. Return to clinic in two months for 30 mins  The patient demonstrates the following risk factors for suicide: Chronic risk factors for suicide include: psychiatric disorder of depression. Acute risk  factors for suicide include: social withdrawal/isolation. Protective factors for this patient include: responsibility to others (children, family) and coping skills. Considering these factors, the overall suicide risk at this point appears to be low. Patient is appropriate for outpatient follow up.  The duration of this appointment visit was 30 minutes of face-to-face time with the patient.  Greater than 50% of this time was spent in counseling, explanation of  diagnosis, planning of further management, and coordination of care.  [name], MD 10/08/2016, 2:53 PM

## 2016-10-03 NOTE — Telephone Encounter (Signed)
She will be seen today

## 2016-10-04 ENCOUNTER — Encounter: Payer: Self-pay | Admitting: Family Medicine

## 2016-10-04 ENCOUNTER — Ambulatory Visit (INDEPENDENT_AMBULATORY_CARE_PROVIDER_SITE_OTHER): Payer: 59 | Admitting: Family Medicine

## 2016-10-04 VITALS — BP 136/84 | HR 85 | Temp 98.1°F | Resp 16 | Ht 67.0 in | Wt 331.5 lb

## 2016-10-04 DIAGNOSIS — E0520 Thyrotoxicosis with toxic multinodular goiter without thyrotoxic crisis or storm: Secondary | ICD-10-CM | POA: Diagnosis not present

## 2016-10-04 DIAGNOSIS — E279 Disorder of adrenal gland, unspecified: Secondary | ICD-10-CM | POA: Diagnosis not present

## 2016-10-04 DIAGNOSIS — Z23 Encounter for immunization: Secondary | ICD-10-CM | POA: Diagnosis not present

## 2016-10-04 DIAGNOSIS — I10 Essential (primary) hypertension: Secondary | ICD-10-CM | POA: Diagnosis not present

## 2016-10-04 DIAGNOSIS — F321 Major depressive disorder, single episode, moderate: Secondary | ICD-10-CM | POA: Diagnosis not present

## 2016-10-04 DIAGNOSIS — E278 Other specified disorders of adrenal gland: Secondary | ICD-10-CM

## 2016-10-04 MED ORDER — LISINOPRIL 20 MG PO TABS: 20.0000 mg | ORAL_TABLET | Freq: Every day | ORAL | 0 refills | Status: DC

## 2016-10-04 NOTE — Progress Notes (Signed)
Name: Madeline Dominguez   MRN: [id number]    DOB: 1965-05-06   Date:10/04/2016       Progress Note  Subjective  Chief Complaint  Chief Complaint  Patient presents with  . Follow-up    1 month F/U  . Depression    Improving with Zoloft, when she wakes up she is a little shaky. But feels much better, states she can live with the shakiness in the morning.  Marland Kitchen Hypertension    Edema in legs occasionally. BP has been running around 130/80's with Zoloft at home    HPI  She has been feeling sick since end of May 2018, on her 09/2016 visit  I found out that her husband was incarcerated April 2018 - for breaking an entry and stealing. Her symptoms  started with SOB and bp going up, followed by a cough. She returned for follow mid June with dizziness, weakness and bp was spiking. We tried multiple bp medications and it was causing worsening of dizziness, and bp was  going up and down. She was evaluated by cardiologist ( Dr. Caryl Comes) and nephrologist ( Dr. Holley Raring), cleared in terms of cardiac rhythm disorder, also negative cortisol and aldosterone levels..  She is poor historian, but daughter was present during one of her visits and  told me that she also had leg weakness. facial flushing, and sometimes some burning sensation on arms and upper back, lack of appetite, upper abdominal pain ( described as aching , bloating sensation), change in bowel movements now is just constipation, no lose stools in between. Denies blood in stools. No nausea or vomiting . Seen by psychiatrist and Endo, doing much better now and ready to go back to work, likely secondary to depression  HTN: doing much better , taking medication as prescribed, mild swelling on legs but not bothersome. No chest pain or palpitation  Depression: she is feeling much better having normal energy level, smiling much more, seeing Dr. Valentina Gu and taking Zoloft, still feels a little anxious in am's but otherwise no sadness, crying spells, fatigue.    Adrenal nodule left: no symptoms, many tests done, seeing endocrinologist at [facility], and still being evaluated. She feels ready to go back to work.   Goiter: no dysphagia, recently had [place], seeing endo  Obesity: gained a little weight, but she states she feels better emotionally and appetite is back to normal, she will try not to gain all the weight back.   Patient Active Problem List   Diagnosis Date Noted  . Adrenal adenoma, left 09/11/2016  . Current moderate episode of major depressive disorder without prior episode ([place]) 08/10/2016  . 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 History of radioactive iodine thyroid ablation   . 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 Right leg DVT ([place]) 08/01/2015  . Iron deficiency anemia 07/27/2015  . B12 deficiency 07/27/2015  . Bilateral pulmonary embolism ([place]) 07/25/2015  . Adrenal nodule ([place]) 07/25/2015  . Pulmonary nodules/lesions, multiple 07/25/2015  . OSA (obstructive sleep apnea) 05/18/2015  . Thyroid nodule 12/17/2014  . Heavy menstrual period 09/02/2014  . Gastroesophageal reflux disease without esophagitis 09/02/2014  . Extreme obesity 06/22/2014  . Large breasts 06/22/2014  . Anemia, iron deficiency 06/22/2014  . Uncontrolled hypertension 06/22/2014  . H/O hyperthyroidism 06/22/2014  . Right ventricular dilation 06/22/2014  . 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    Past Surgical History:  Procedure Laterality Date  . CESAREAN SECTION  Family History  Problem Relation Age of Onset  . CVA Father     Social History   Social History  . Marital status: Married    Spouse name: N/A  . Number of children: N/A  . Years of education: N/A   Occupational History  . Not on file.   Social History Main Topics  . Smoking status: Never Smoker  . Smokeless tobacco: Never Used  . Alcohol use No     Comment: rarely  . Drug  use: No  . Sexual activity: Yes    Partners: Male    Birth control/ protection: Condom   Other Topics Concern  . Not on file   Social History Narrative   Lives with 3 children, husband was incarcerated April 2018 - for stealing, she has not been keeping in touch with him because she is disappointed. 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      Current Outpatient Prescriptions:  .  amLODipine (NORVASC) 5 MG tablet, Take 1 tablet by mouth daily., Disp: , Rfl:  .  cyclobenzaprine (FLEXERIL) 10 MG tablet, Take 10 mg by mouth 3 (three) times daily as needed for muscle spasms. , Disp: , Rfl:  .  ferrous sulfate 325 (65 FE) MG tablet, Take 1 tablet (325 mg total) by mouth 2 (two) times daily with a meal., Disp: 180 tablet, Rfl: 1 .  lisinopril (PRINIVIL,ZESTRIL) 20 MG tablet, Take 1 tablet (20 mg total) by mouth daily., Disp: 90 tablet, Rfl: 0 .  sertraline (ZOLOFT) 50 MG tablet, Take 1 tablet (50 mg total) by mouth daily., Disp: 30 tablet, Rfl: 1 .  vitamin B-12 1000 MCG tablet, Take 1 tablet (1,000 mcg total) by mouth daily., Disp: 30 tablet, Rfl: 0 .  omeprazole (PRILOSEC) 40 MG capsule, Take 1 capsule (40 mg total) by mouth daily. (Patient not taking: Reported on 10/04/2016), Disp: 30 capsule, Rfl: 3  Allergies  Allergen Reactions  . Aspirin Nausea And Vomiting  . Hctz [Hydrochlorothiazide] Other (See Comments)    Nausea, cramping     ROS  Constitutional: Negative for fever or weight change.  Respiratory: Negative for cough and shortness of breath.   Cardiovascular: Negative for chest pain or palpitations.  Gastrointestinal: Negative for abdominal pain, no bowel changes.  Musculoskeletal: Negative for gait problem or joint swelling.  Skin: Negative for rash.  Neurological: Negative for dizziness or headache.  No other specific complaints in a complete review of systems (except as listed in HPI above).  Objective  Vitals:   10/04/16  1119  BP: 136/84  Pulse: 85  Resp: 16  Temp: 98.1 F (36.7 C)  TempSrc: Oral  SpO2: 96%  Weight: (!) 331 lb 8 oz (150.4 kg)  Height: _0  (1.702 m)    Body mass index is 51.92 kg/m.  Physical Exam  Constitutional: Patient appears well-developed and well-nourished. Obese  No distress.  HEENT: head atraumatic, normocephalic, pupils equal and reactive to light,  neck supple, throat within normal limits Cardiovascular: Normal rate, regular rhythm and normal heart sounds.  No murmur heard. No BLE edema. Pulmonary/Chest: Effort normal and breath sounds normal. No respiratory distress. Abdominal: Soft.  There is no tenderness. Psychiatric: Patient has a normal mood and affect. behavior is normal. Judgment and thought content normal.  Recent Results (from the past 2160 hour(s))  CBC     Status: Abnormal   Collection Time: 07/24/16 11:31 AM  Result Value Ref Range  WBC 5.7 3.6 - 11.0 K/uL   RBC 3.96 3.80 - 5.20 MIL/uL   Hemoglobin 11.7 (L) 12.0 - 16.0 g/dL   HCT 34.9 (L) 35.0 - 47.0 %   MCV 88.2 80.0 - 100.0 fL   MCH 29.6 26.0 - 34.0 pg   MCHC 33.6 32.0 - 36.0 g/dL   RDW 17.3 (H) 11.5 - 14.5 %   Platelets 252 150 - 440 K/uL  Comprehensive metabolic panel     Status: Abnormal   Collection Time: 07/24/16 11:31 AM  Result Value Ref Range   Sodium 139 135 - 145 mmol/L   Potassium 3.6 3.5 - 5.1 mmol/L   Chloride 104 101 - 111 mmol/L   CO2 27 22 - 32 mmol/L   Glucose, Bld 115 (H) 65 - 99 mg/dL   BUN 9 6 - 20 mg/dL   Creatinine, Ser 0.69 0.44 - 1.00 mg/dL   Calcium 9.4 8.9 - 10.3 mg/dL   Total Protein 7.6 6.5 - 8.1 g/dL   Albumin 3.5 3.5 - 5.0 g/dL   AST 13 (L) 15 - 41 U/L   ALT 11 (L) 14 - 54 U/L   Alkaline Phosphatase 76 38 - 126 U/L   Total Bilirubin 0.8 0.3 - 1.2 mg/dL   GFR calc non Af Amer >60 >60 mL/min   GFR calc Af Amer >60 >60 mL/min    Comment: (NOTE) The eGFR has been calculated using the CKD EPI equation. This calculation has not been validated in all clinical  situations. eGFR's persistently <60 mL/min signify possible Chronic Kidney Disease.    Anion gap 8 5 - 15  TSH     Status: None   Collection Time: 07/24/16 11:31 AM  Result Value Ref Range   TSH 2.789 0.350 - 4.500 uIU/mL    Comment: Performed by a 3rd Generation assay with a functional sensitivity of <=0.01 uIU/mL.  Troponin I     Status: None   Collection Time: 07/24/16 11:31 AM  Result Value Ref Range   Troponin I <0.03 <0.03 ng/mL  5 HIAA, quantitative, Urine, 24 hour     Status: None   Collection Time: 08/01/16  3:15 PM  Result Value Ref Range   5-HIAA, 24 Hr Urine 1.2 <=6.0 mg/24 h    Comment: This specimen was submitted with a pH greater than 5.0.  Optimum pH for this assay is 1.0-5.0. Improper preservation may compromise the validity of the assay. This test was developed and its analytical performance characteristics have been determined by [place], [place]. It has not been cleared or approved by the U.S. Food and Drug Administration. This assay has been validated pursuant to the CLIA regulations and is used for clinical purposes.   TSH     Status: None   Collection Time: 08/10/16  9:39 AM  Result Value Ref Range   TSH 2.21 mIU/L    Comment:   Reference Range   > or = 20 Years  0.40-4.50   Pregnancy Range First trimester  0.26-2.66 Second trimester 0.55-2.73 Third trimester  0.43-2.91     Catecholamines, Frac w/VMA, 24H Ur     Status: Abnormal   Collection Time: 08/10/16  9:59 AM  Result Value Ref Range   Total Volume 750 mL   Epinephrine, 24H, Ur 4 2 - 24 mcg/24 h    Comment:   This test was developed and its analytical performance characteristics have been determined by [facility]. It has not  been cleared or approved by FDA. This assay has been validated pursuant to the CLIA regulations and is used for clinical purposes.    Norepinephrine, 24H, Ur 83 15 - 100 mcg/24 h     Comment:   This test was developed and its analytical performance characteristics have been determined by [facility]. It has not been cleared or approved by FDA. This assay has been validated pursuant to the CLIA regulations and is used for clinical purposes.    Calc Total (E+NE) 87 26 - 121 mcg/24 h    Comment:   This test was developed and its analytical performance characteristics have been determined by [facility]. It has not been cleared or approved by FDA. This assay has been validated pursuant to the CLIA regulations and is used for clinical purposes.    Dopamine 534 (H) 52 - 480 mcg/24 h    Comment:   This test was developed and its analytical performance characteristics have been determined by [facility]. It has not been cleared or approved by FDA. This assay has been validated pursuant to the CLIA regulations and is used for clinical purposes.    Vanillylmandelic Acid 4.7 6 OR LESS mg/24 h    Comment:   This test was developed and its analytical performance characteristics have been determined by [facility]. It has not been cleared or approved by FDA. This assay has been validated pursuant to the CLIA regulations and is used for clinical purposes.    Creatinine, 24H, Ur 1.82 0.63 - 2.50 g/24 h  5 HIAA w/Creatinine, 24 hr.     Status: None   Collection Time: 08/10/16  9:59 AM  Result Value Ref Range   Total Volume 750 mL    Comment: Please note:  [id number]                      5-HIAA 2.9 <=6.0 mg/24 h    Comment: This test was developed and its analytical performance characteristics have been determined by [place], [place]. It has not been cleared or approved by the U.S. Food and Drug Administration. This assay has been validated pursuant to the CLIA  regulations and is used for clinical purposes.    Creatinine 1.85 0.63 - 2.50 g/24 h  Catecholamines, fractionated, plasma     Status: None   Collection Time: 09/04/16 11:52 AM  Result Value Ref Range   Epinephrine 23 pg/mL    Comment: This test was developed and its analytical performance characteristics have been determined by [place], [place]. It has not been cleared or approved by the U.S. Food and Drug Administration. This assay has been validated pursuant to the CLIA regulations and is used for clinical purposes.    Norepinephrine 528 pg/mL    Comment: This test was developed and its analytical performance characteristics have been determined by [place], [place]. It has not been cleared or approved by the U.S. Food and Drug Administration. This assay has been validated pursuant to the CLIA regulations and is used for clinical purposes.    Dopamine REPORT     Comment: Results are below reportable range for this analyte, which is 30 pg/mL. This test was developed and its analytical performance characteristics have been determined by [place], [place]. It has not been cleared or  approved by the U.S. Food and Drug Administration. This assay has been validated pursuant to the CLIA regulations and is used for clinical purposes.    Catecholamines, Total 551 pg/mL    Comment: Adult Reference Ranges for Catecholamines, Plasma Epinephrine         Supine:  LESS THAN 50 pg/mL                     Upright: LESS THAN 95 pg/mL Norepinephrine      Supine:  112-658 pg/mL                     Upright: [phone] pg/mL Dopamine            Supine:  LESS THAN 30 pg/mL                     Upright: LESS THAN 30 pg/mL Total (N+E)         Supine:  123-671 pg/mL                     Upright: [phone] pg/mL Pediatric Reference Ranges for Catecholamines, Plasma Due to stress, plasma catecholamine  levels are generally unreliable in infants and small children. Urinary catecholamine assays are more reliable. Epinephrine         Supine:  LESS THAN OR EQUAL    3-15 Years                TO 464 pg/mL                     Upright: Not Available Norepinephrine      Supine:  LESS THAN OR EQUAL    3-15 Years                TO 1251 pg/mL                     Upright: Not Available Dopamine            Supine:  LESS THAN 60 pg/mL    3-15 Years       Upright: Not Available Pedi atric data from [employer] [phone]. This test was developed and its analytical performance characteristics have been determined by [place], [place]. It has not been cleared or approved by the U.S. Food and Drug Administration. This assay has been validated pursuant to the CLIA regulations and is used for clinical purposes.       PHQ2/9: Depression screen [facility] 2/9 10/04/2016 06/22/2016 12/20/2015 09/02/2015 03/04/2015  Decreased Interest 0 0 0 0 0  Down, Depressed, Hopeless 0 0 0 0 0  PHQ - 2 Score 0 0 0 0 0     Fall Risk: Fall Risk  10/04/2016 06/22/2016 05/03/2016 12/20/2015 09/02/2015  Falls in the past year? _0      Functional Status Survey: Is the patient deaf or have difficulty hearing?: No Does the patient have difficulty seeing, even when wearing glasses/contacts?: No Does the patient have difficulty concentrating, remembering, or making decisions?: No Does the patient have difficulty walking or climbing stairs?: No Does the patient have difficulty dressing or bathing?: No Does the patient have difficulty doing errands alone such as visiting a doctor's office or shopping?: No   Assessment & Plan  1. Current moderate episode of major depressive disorder without prior episode [facility])  Doing well now, taking medication as prescribed, she will return  to work today  2. Needs flu shot  - Flu Vaccine QUAD 6+ mos PF IM (Fluarix Quad PF)  3. Adrenal  nodule ([place])  Continue follow up with endo   4. Goiter, toxic, multinodular  Continue follow up with Dr. Kenton Kingfisher  5. Morbid obesity, unspecified obesity type ([place])  Stable   6. Hypertension, benign  - lisinopril (PRINIVIL,ZESTRIL) 20 MG tablet; Take 1 tablet (20 mg total) by mouth daily.  Dispense: 90 tablet; Refill: 0

## 2016-10-08 ENCOUNTER — Ambulatory Visit (INDEPENDENT_AMBULATORY_CARE_PROVIDER_SITE_OTHER): Payer: 59 | Admitting: Psychiatry

## 2016-10-08 ENCOUNTER — Ambulatory Visit: Payer: 59 | Admitting: Family Medicine

## 2016-10-08 VITALS — BP 120/80 | HR 72 | Ht 67.01 in | Wt 332.0 lb

## 2016-10-08 DIAGNOSIS — G4733 Obstructive sleep apnea (adult) (pediatric): Secondary | ICD-10-CM | POA: Diagnosis not present

## 2016-10-08 DIAGNOSIS — Z79899 Other long term (current) drug therapy: Secondary | ICD-10-CM

## 2016-10-08 DIAGNOSIS — Z6841 Body Mass Index (BMI) 40.0 and over, adult: Secondary | ICD-10-CM | POA: Diagnosis not present

## 2016-10-08 DIAGNOSIS — F331 Major depressive disorder, recurrent, moderate: Secondary | ICD-10-CM | POA: Diagnosis not present

## 2016-10-08 DIAGNOSIS — I2699 Other pulmonary embolism without acute cor pulmonale: Secondary | ICD-10-CM | POA: Diagnosis not present

## 2016-10-08 DIAGNOSIS — F321 Major depressive disorder, single episode, moderate: Secondary | ICD-10-CM

## 2016-10-08 DIAGNOSIS — I10 Essential (primary) hypertension: Secondary | ICD-10-CM | POA: Diagnosis not present

## 2016-10-08 DIAGNOSIS — I429 Cardiomyopathy, unspecified: Secondary | ICD-10-CM | POA: Diagnosis not present

## 2016-10-08 DIAGNOSIS — E669 Obesity, unspecified: Secondary | ICD-10-CM

## 2016-10-08 MED ORDER — SERTRALINE HCL 50 MG PO TABS: 50.0000 mg | ORAL_TABLET | Freq: Every day | ORAL | 1 refills | Status: DC

## 2016-10-08 NOTE — Patient Instructions (Addendum)
1. Continue sertraline 50 mg daily 2. Referral to therapy 3. Return to clinic in two months for 30 mins

## 2016-10-31 ENCOUNTER — Ambulatory Visit: Payer: 59 | Admitting: Physician Assistant

## 2016-10-31 ENCOUNTER — Encounter: Payer: Self-pay | Admitting: Physician Assistant

## 2016-10-31 NOTE — Progress Notes (Deleted)
Cardiology Office Note Date:  10/31/2016  Patient ID:  Madeline Dominguez, Madeline Dominguez 04-Jan-1965, MRN [id number] PCP:  Steele Sizer, MD  Cardiologist:  Dr. Caryl Comes, MD  ***refresh   Chief Complaint: ***  History of Present Illness: Madeline Dominguez is a 51 y.o. female with history of dilated cardiomyopathy, goiter/hyperthyroidism since 2010 s/p radioactive iodine treatment, atypical chest pain, prior bilateral PE s/p treatment with Eliquis in 07/2015, adrenal adenoma with normal cortisol and aldosterone levels, possible OSA, HTN, chronic dyspnea, iron deficiency anemia with pica previously requiring IV iron repletion, morbid obesity, and depression who presents for ***.  She was initially evaluated by cardiology in 06/2014 for dyspnea. Prior echo from 03/2013 at [facility] showed an EF of 55-60%, dilated LV and left atrium, and normal RV. Repeat echo from 06/2014 showed an EF of 55-60%, no RWMA, normal LV diastolic function parameters, LV cavity size was normal with mild concentric hypertrophy, mildly dilated ascending aorta, left atrium normal in size, RV systolic function was normal, PASP normal. She wa admitted to [facility] in 07/2015 with bilateral PE with right lower extremity DVT. Echo in 07/2015 showed EF 45-50%, unable to exclude RWMA, normal LV diastolic function parameters, RV cavity was mildly dilated with normal wall thickness and systolic function, PASP was normal. She was treated with IV heparin and transitioned to Eliquis. Hypercoagulable workup was negative. It was felt her PE was secondary to OCPs and they were discontinued. She was seen in the ED 05/2016 for SOB in the setting of poorly controlled BP with negative CXR, non-acute EKG and normal cardiac enzyme, normal BNP. Was under a lot of stress at home. Follow up PCP visit in 06/2016 showed a normal d-dimer. This was followed by admission to [facility] in late 06/2016 for chest pain and dizziness in which she ruled out. Echo 07/2016 showed EF 45-50%, mild LVH,  Gr1DD, mildly dilated left atrium, mildly dilated RV with normal systolic function, mildly dilated RA. Faint echoes noted along the inferolateral wall near the apex where unchanged from study in 07/2015. Consideration including prominent trabectulation, focal hypertrophy, and LV noncompaction. Thrombus was felt less likely. She was seen by EP for lightheadedness and "out of body sensation" that were not felt to be cardiac in etiology.   She has previously been on Bystolic and HCTZ, though this led to soft BP and dizziness leading to tapering of medications and transition to alternatives. Currently taking amlodipine 5 mg and lisinopril 20 mg. Most recently seen by PCP in early October, doing better at that time with BP 136/84.   ***   Past Medical History:  Diagnosis Date  . Adrenal adenoma    a. normal cortisol and aldosterone levels  . Cough 07/25/2015  . Dilated cardiomyopathy ([place])    a. TTE 2/15: EF of 55-60%, dilated LV & LA, and nl RV; b. TTE 6/16:EF 55-60%, no RWMA, nl LV dias fxn, LV cavity size nl with mild concentric LVH, mildly dilated ascending aorta, LA nl size, RV sys fxn nl, PASP nl; c. TTE 7/17: EF 45-50%, unable to exclude RWMA, nl LV dias faxn, mildly dilated RV w/ nl sys fxn, PASP nl; d. TTE 7/18: EF 45-50%, mild LVH, Gr1DD, mildly dilated LA, mildly dilated RV  . Goiter    a. s/p radioactive iodine treatment in 2010  . Heart palpitations   . Hypertension   . Iron deficiency   . Large breasts   . Morbid obesity ([place])   . Postpartum hemorrhage   . Pulmonary  embolism ([place]) 07/2015   a. s/p Eliquis; b. hypercoag work up neg; c. felt to be 2/2 OCP    Past Surgical History:  Procedure Laterality Date  . CESAREAN SECTION      No outpatient prescriptions have been marked as taking for the 10/31/16 encounter (Appointment) with Rise Mu, PA-C.    Allergies:   Aspirin and Hctz [hydrochlorothiazide]   Social History:  The patient  reports that she has never smoked. She  has never used smokeless tobacco. She reports that she does not drink alcohol or use drugs.   Family History:  The patient's family history includes CVA in her father.  ROS:   ROS   PHYSICAL EXAM: *** VS:  There were no vitals taken for this visit. BMI: There is no height or weight on file to calculate BMI.  Physical Exam   EKG:  Was ordered and interpreted by me today. Shows ***  Recent Labs: 05/18/2016: B Natriuretic Peptide 64.0 07/24/2016: ALT 11; BUN 9; Creatinine, Ser 0.69; Hemoglobin 11.7; Platelets 252; Potassium 3.6; Sodium 139 08/10/2016: TSH 2.21  04/27/2016: Cholesterol 178; HDL 68; LDL Cholesterol 99; Total CHOL/HDL Ratio 2.6; Triglycerides 53; VLDL 11   CrCl cannot be calculated (Patient's most recent lab result is older than the maximum 21 days allowed.).   Wt Readings from Last 3 Encounters:  10/04/16 (!) 331 lb 8 oz (150.4 kg)  09/04/16 (!) 326 lb 3 oz (148 kg)  08/07/16 (!) 325 lb 1.6 oz (147.5 kg)     Other studies reviewed: Additional studies/records reviewed today include: summarized above  ASSESSMENT AND PLAN:  1. Dilated/NICM: 2. Palpitations/PVCs: 3. HTN: 4. Possible sleep apnea: Recommend sleep study.  5. Dyspnea: Consider PFTs. Defer to PCP.   Disposition: F/u with *** in   Current medicines are reviewed at length with the patient today.  The patient did not have any concerns regarding medicines.  Melvern Banker PA-C 10/31/2016 10:28 AM     [place] [address] [place], [place] [address] [phone]

## 2016-11-01 ENCOUNTER — Encounter: Payer: Self-pay | Admitting: Physician Assistant

## 2016-11-06 ENCOUNTER — Ambulatory Visit (HOSPITAL_COMMUNITY): Payer: Self-pay | Admitting: Licensed Clinical Social Worker

## 2016-11-27 NOTE — Progress Notes (Deleted)
[place] MD/PA/NP OP Progress Note  11/27/2016 2:34 PM NELLE SAYED  MRN:  [id number]  Chief Complaint:  HPI: *** Visit Diagnosis: No diagnosis found.  Past Psychiatric History:  I have reviewed the patient's psychiatry history in detail and updated the patient record. Outpatient: denies Psychiatry admission: denies Previous suicide attempt: denies Past trials of medication: denies History of violence:     Past Medical History:  Past Medical History:  Diagnosis Date  . Adrenal adenoma    a. normal cortisol and aldosterone levels  . Cough 07/25/2015  . Dilated cardiomyopathy ([place])    a. TTE 2/15: EF of 55-60%, dilated LV & LA, and nl RV; b. TTE 6/16:EF 55-60%, no RWMA, nl LV dias fxn, LV cavity size nl with mild concentric LVH, mildly dilated ascending aorta, LA nl size, RV sys fxn nl, PASP nl; c. TTE 7/17: EF 45-50%, unable to exclude RWMA, nl LV dias faxn, mildly dilated RV w/ nl sys fxn, PASP nl; d. TTE 7/18: EF 45-50%, mild LVH, Gr1DD, mildly dilated LA, mildly dilated RV  . Goiter    a. s/p radioactive iodine treatment in 2010  . Heart palpitations   . Hypertension   . Iron deficiency   . Large breasts   . Morbid obesity ([place])   . Postpartum hemorrhage   . Pulmonary embolism ([place]) 07/2015   a. s/p Eliquis; b. hypercoag work up neg; c. felt to be 2/2 OCP    Past Surgical History:  Procedure Laterality Date  . CESAREAN SECTION      Family Psychiatric History:  I have reviewed the patient's family history in detail and updated the patient record.  Family History:  Family History  Problem Relation Age of Onset  . CVA Father     Social History:  Social History   Socioeconomic History  . Marital status: Married    Spouse name: Not on file  . Number of children: Not on file  . Years of education: Not on file  . Highest education level: Not on file  Social Needs  . Financial resource strain: Not on file  . Food insecurity - worry: Not on file  . Food  insecurity - inability: Not on file  . Transportation needs - medical: Not on file  . Transportation needs - non-medical: Not on file  Occupational History  . Not on file  Tobacco Use  . Smoking status: Never Smoker  . Smokeless tobacco: Never Used  Substance and Sexual Activity  . Alcohol use: No    Alcohol/week: 0.0 oz    Comment: rarely  . Drug use: No  . Sexual activity: Yes    Partners: Male    Birth control/protection: Condom  Other Topics Concern  . Not on file  Social History Narrative   Lives with 3 children, husband was incarcerated April 2018 - for stealing, she has not been keeping in touch with him because she is disappointed. 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    Education: 12 th grade She grew up in [place], raised by her mother who was a single parent, she has two sisters and one brother. 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 She was a single parent for 25 years.  She lives with her three children (  25, 20, 14)    Allergies:  Allergies  Allergen Reactions  . Aspirin Nausea And Vomiting  . Hctz [Hydrochlorothiazide] Other (See Comments)    Nausea, cramping    Metabolic Disorder Labs: Lab Results  Component Value Date   HGBA1C 4.9 04/27/2016   MPG 94 04/27/2016   MPG 94 12/20/2015   No results found for: PROLACTIN Lab Results  Component Value Date   CHOL 178 04/27/2016   TRIG 53 04/27/2016   HDL 68 04/27/2016   CHOLHDL 2.6 04/27/2016   VLDL 11 04/27/2016   LDLCALC 99 04/27/2016   LDLCALC 105 (H) 12/03/2014   Lab Results  Component Value Date   TSH 2.21 08/10/2016   TSH 2.789 07/24/2016    Therapeutic Level Labs: No results found for: LITHIUM No results found for: VALPROATE No  components found for:  CBMZ  Current Medications: Current Outpatient Medications  Medication Sig Dispense Refill  . amLODipine (NORVASC) 5 MG tablet Take 1 tablet by mouth daily.    . cyclobenzaprine (FLEXERIL) 10 MG tablet Take 10 mg by mouth 3 (three) times daily as needed for muscle spasms.     . ferrous sulfate 325 (65 FE) MG tablet Take 1 tablet (325 mg total) by mouth 2 (two) times daily with a meal. 180 tablet 1  . lisinopril (PRINIVIL,ZESTRIL) 20 MG tablet Take 1 tablet (20 mg total) by mouth daily. 90 tablet 0  . omeprazole (PRILOSEC) 40 MG capsule Take 1 capsule (40 mg total) by mouth daily. (Patient not taking: Reported on 10/04/2016) 30 capsule 3  . sertraline (ZOLOFT) 50 MG tablet Take 1 tablet (50 mg total) by mouth daily. 30 tablet 1  . vitamin B-12 1000 MCG tablet Take 1 tablet (1,000 mcg total) by mouth daily. 30 tablet 0   No current facility-administered medications for this visit.      Musculoskeletal: Strength & Muscle Tone: within normal limits Gait & Station: normal Patient leans: N/A  Psychiatric Specialty Exam: ROS  There were no vitals taken for this visit.There is no height or weight on file to calculate BMI.  General Appearance: Fairly Groomed  Eye Contact:  Good  Speech:  Clear and Coherent  Volume:  Normal  Mood:  {BHH MOOD:22306}  Affect:  {Affect (PAA):22687}  Thought Process:  Coherent and Goal Directed  Orientation:  Full (Time, Place, and Person)  Thought Content: Logical   Suicidal Thoughts:  {ST/HT (PAA):22692}  Homicidal Thoughts:  {ST/HT (PAA):22692}  Memory:  Immediate;   Good Recent;   Good Remote;   Good  Judgement:  {Judgement (PAA):22694}  Insight:  {Insight (PAA):22695}  Psychomotor Activity:  Normal  Concentration:  Concentration: Good and Attention Span: Good  Recall:  Good  Fund of Knowledge: Good  Language: Good  Akathisia:  Negative  Handed:  Right  AIMS (if indicated): not done  Assets:  Communication Skills Desire  for Improvement  ADL's:  Intact  Cognition: WNL  Sleep:  {BHH GOOD/FAIR/POOR:22877}   Screenings: PHQ2-9     [facility] Visit from 10/04/2016 in [facility] Office Visit from 06/22/2016 in [facility] Office Visit from 12/20/2015 in [facility] Office Visit from 09/02/2015 in [facility] Office Visit from 03/04/2015 in [facility]  PHQ-2 Total Score  0  0  0  0  0       Assessment and Plan:  Madeline Dominguez is a 51 y.o. year old female with a history of depression,  hypertension, dilated cardiomyopathy, bilateral pulmonary embolism, obesity, obstructive sleep apnea on CPAP, history of toxic multinodular goiter, who presents for follow up appointment for No diagnosis found.Per chart review, she was seen by a cardiologist for referral for EP study given concern for dizziness. "No clear cardiac explanation for her spells, with ongoing symptoms with normal vital signs and not attenuated by recumbency" PCP record was not available.   # MDD, moderate, recurrent without psychotic features Exam is notable for  Improved affect and patient reports significant improvement in neurovegetative symptoms. Will continue sertraline to target depression. Discussed behavioral activation. She will greatly benefit from CBT; referral is made.   Plan 1. Continue sertraline 50 mg daily 2. Referral to therapy 3. Return to clinic in two months for 30 mins  The patient demonstrates the following risk factors for suicide: Chronic risk factors for suicide include: psychiatric disorder of depression. 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 Protective factorsfor this patient include: responsibility to others (children, family) and coping skills. Considering these factors, the overall suicide risk at this point appears to be low. Patient isappropriate for outpatient follow up.   [name],  MD 11/27/2016, 2:34 PM

## 2016-12-03 ENCOUNTER — Ambulatory Visit (HOSPITAL_COMMUNITY): Payer: 59 | Admitting: Psychiatry

## 2016-12-18 NOTE — Progress Notes (Signed)
[place] MD/PA/NP OP Progress Note  12/19/2016 12:27 PM Madeline Dominguez  MRN:  [id number]  Chief Complaint:  Chief Complaint    Depression; Follow-up     HPI:  Patient presents for follow-up appointment for depression.  She states that she has been feeling much better.  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  She reports better interaction with her children.  She does not feel irritable as she used to.  She states that she had a good Thanksgiving, meet with her family member.  She has been getting out more with her children.  She feels anxious especially when she thinks about the relationship with her husband who is incarcerated.  She does not think she can keep the relationship if she continues to be with people who uses drugs. However, she feels ambivalent as she cares for the person she used to love. She believes that her mood will get better after she is able to deal with this issue. She feels depressed at times.  She has more energy.  She has fair concentration and appetite.  She denies SI.  She feels anxious and tense at times.  She denies panic attacks.   Visit Diagnosis:    ICD-10-CM   1. Current moderate episode of major depressive disorder without prior episode ([place]) F32.1     Past Psychiatric History:  I have reviewed the patient's psychiatry history in detail and updated the patient record. Outpatient: denies Psychiatry admission: denies Previous suicide attempt: denies Past trials of medication: denies History of violence:   Past Medical History:  Past Medical History:  Diagnosis Date  . Adrenal adenoma    a. normal cortisol and aldosterone levels  . Cough 07/25/2015  . Dilated cardiomyopathy ([place])    a. TTE 2/15: EF of 55-60%, dilated LV & LA, and nl RV; b. TTE 6/16:EF 55-60%, no RWMA, nl LV dias fxn, LV cavity size nl with mild concentric LVH, mildly dilated ascending aorta, LA nl size, RV sys fxn nl, PASP nl; c. TTE 7/17: EF 45-50%,  unable to exclude RWMA, nl LV dias faxn, mildly dilated RV w/ nl sys fxn, PASP nl; d. TTE 7/18: EF 45-50%, mild LVH, Gr1DD, mildly dilated LA, mildly dilated RV  . Goiter    a. s/p radioactive iodine treatment in 2010  . Heart palpitations   . Hypertension   . Iron deficiency   . Large breasts   . Morbid obesity ([place])   . Postpartum hemorrhage   . Pulmonary embolism ([place]) 07/2015   a. s/p Eliquis; b. hypercoag work up neg; c. felt to be 2/2 OCP    Past Surgical History:  Procedure Laterality Date  . CESAREAN SECTION      Family Psychiatric History:  I have reviewed the patient's family history in detail and updated the patient record.  Family History:  Family History  Problem Relation Age of Onset  . CVA Father     Social History:  Social History   Socioeconomic History  . Marital status: Married    Spouse name: None  . Number of children: None  . Years of education: None  . Highest education level: None  Social Needs  . Financial resource strain: None  . Food insecurity - worry: None  . Food insecurity - inability: None  . Transportation needs - medical: None  . Transportation needs - non-medical: None  Occupational History  . None  Tobacco Use  . Smoking status:  Never Smoker  . Smokeless tobacco: Never Used  Substance and Sexual Activity  . Alcohol use: No    Alcohol/week: 0.0 oz    Comment: rarely  . Drug use: No  . Sexual activity: Yes    Partners: Male    Birth control/protection: Condom  Other Topics Concern  . None  Social History Narrative   Lives with 3 children, husband was incarcerated April 2018 - for stealing, she has not been keeping in touch with him because she is disappointed. 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    Education: 12 th grade She grew up in [place], raised by her mother who was a single parent, she has two sisters and one brother. 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 She was a single parent for 25 years.  She lives with her three children (25, 31, 14)    Allergies:  Allergies  Allergen Reactions  . Aspirin Nausea And Vomiting  . Hctz [Hydrochlorothiazide] Other (See Comments)    Nausea, cramping    Metabolic Disorder Labs: Lab Results  Component Value Date   HGBA1C 4.9 04/27/2016   MPG 94 04/27/2016   MPG 94 12/20/2015   No results found for: PROLACTIN Lab Results  Component Value Date   CHOL 178 04/27/2016   TRIG 53 04/27/2016   HDL 68 04/27/2016   CHOLHDL 2.6 04/27/2016   VLDL 11 04/27/2016   LDLCALC 99 04/27/2016   LDLCALC 105 (H) 12/03/2014   Lab Results  Component Value Date   TSH 2.21 08/10/2016   TSH 2.789 07/24/2016    Therapeutic Level Labs: No results found for: LITHIUM No results found for: VALPROATE No components found for:  CBMZ  Current Medications: Current Outpatient Medications  Medication Sig Dispense Refill  . amLODipine (NORVASC) 5 MG tablet Take 1 tablet by mouth daily.    . cyclobenzaprine (FLEXERIL) 10 MG tablet Take 10 mg by mouth 3 (three) times daily as needed for muscle spasms.     . ferrous sulfate 325 (65 FE) MG tablet Take 1 tablet (325 mg total) by mouth 2 (two) times daily with a meal. 180 tablet 1  . lisinopril (PRINIVIL,ZESTRIL) 20 MG tablet Take 1 tablet (20 mg total) by mouth daily. 90 tablet 0  . omeprazole (PRILOSEC) 40 MG capsule Take 1 capsule (40 mg total) by mouth daily. 30 capsule 3  . sertraline (ZOLOFT) 50 MG tablet Take 1 tablet (50 mg total) by mouth daily. 90 tablet 0  . vitamin B-12 1000 MCG tablet Take 1 tablet (1,000 mcg total) by mouth daily. 30 tablet 0   No current facility-administered medications for this visit.      Musculoskeletal: Strength & Muscle Tone: within  normal limits Gait & Station: normal Patient leans: N/A  Psychiatric Specialty Exam: Review of Systems  Psychiatric/Behavioral: Positive for depression. Negative for hallucinations, substance abuse and suicidal ideas. The patient is nervous/anxious and has insomnia.   All other systems reviewed and are negative.   Blood pressure (!) 158/97, pulse 65, height 5\' 7"  (1.702 m), weight (!) 338 lb (153.3 kg), SpO2 93 %.Body mass index is 52.94 kg/m.  General Appearance: Fairly Groomed  Eye Contact:  Good  Speech:  Clear and Coherent  Volume:  Normal  Mood:  Depressed  Affect:  Appropriate, Congruent and slightly restricted, but significantly improved  Thought Process:  Coherent and Goal Directed  Orientation:  Full (Time, Place, and Person)  Thought Content: Logical   Suicidal Thoughts:  No  Homicidal Thoughts:  No  Memory:  Immediate;   Good Recent;   Good Remote;   Good  Judgement:  Good  Insight:  Fair  Psychomotor Activity:  Normal  Concentration:  Concentration: Good and Attention Span: Good  Recall:  Good  Fund of Knowledge: Good  Language: Good  Akathisia:  No  Handed:  Right  AIMS (if indicated): not done  Assets:  Communication Skills Desire for Improvement  ADL's:  Intact  Cognition: WNL  Sleep:  Good   Screenings: PHQ2-9     Office Visit from 10/04/2016 in [facility] Office Visit from 06/22/2016 in [facility] Office Visit from 12/20/2015 in [facility] Office Visit from 09/02/2015 in [facility] Office Visit from 03/04/2015 in [facility]  PHQ-2 Total Score  0  0  0  0  0       Assessment and Plan:  Madeline Dominguez is a 51 y.o. year old female with a history of depression,  hypertension, dilated cardiomyopathy, bilateral pulmonary embolism, obesity, obstructive sleep apnea on CPAP, history of toxic multinodular goiter , who presents for follow up appointment for  Current moderate episode of major depressive disorder without prior episode ([place])   # MDD, moderate, recurrent without psychotic features Exam is notable for improved affect and patient reports significant improvement in neurovegetative symptoms. Will continue sertraline to target depression. Will consider uptitration if any worsening in residual symptoms. Explored her dilemma of whether or not to keep the relationship. Discussed self compassion.  Discussed behavioral activation. She will greatly benefit from CBT; referral is made.   Plan I have reviewed and updated plans as below 1. Continue sertraline 50 mg daily 2. Return to clinic in three months for 30 mins  The patient demonstrates the following risk factors for suicide: Chronic risk factors for suicide include: psychiatric disorder of depression. 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 Protective factorsfor this patient include: responsibility to others (children, family) and coping skills. Considering these factors, the overall suicide risk at this point appears to be low. Patient isappropriate for outpatient follow up.  The duration of this appointment visit was 30 minutes of face-to-face time with the patient.  Greater than 50% of this time was spent in counseling, explanation of  diagnosis, planning of further management, and coordination of care.  [name], MD 12/19/2016, 12:27 PM

## 2016-12-19 ENCOUNTER — Encounter (HOSPITAL_COMMUNITY): Payer: Self-pay | Admitting: Psychiatry

## 2016-12-19 ENCOUNTER — Ambulatory Visit (INDEPENDENT_AMBULATORY_CARE_PROVIDER_SITE_OTHER): Payer: 59 | Admitting: Psychiatry

## 2016-12-19 VITALS — BP 158/97 | HR 65 | Ht 67.0 in | Wt 338.0 lb

## 2016-12-19 DIAGNOSIS — R450 Nervousness: Secondary | ICD-10-CM

## 2016-12-19 DIAGNOSIS — F321 Major depressive disorder, single episode, moderate: Secondary | ICD-10-CM | POA: Diagnosis not present

## 2016-12-19 DIAGNOSIS — G4700 Insomnia, unspecified: Secondary | ICD-10-CM

## 2016-12-19 DIAGNOSIS — F419 Anxiety disorder, unspecified: Secondary | ICD-10-CM

## 2016-12-19 MED ORDER — SERTRALINE HCL 50 MG PO TABS: 50.0000 mg | ORAL_TABLET | Freq: Every day | ORAL | 0 refills | Status: DC

## 2016-12-19 NOTE — Patient Instructions (Signed)
1. Continue sertraline 50 mg daily 2. Return to clinic in three months for 30 mins

## 2017-01-04 ENCOUNTER — Ambulatory Visit: Payer: 59 | Admitting: Family Medicine

## 2017-01-16 ENCOUNTER — Ambulatory Visit: Payer: 59 | Admitting: Family Medicine

## 2017-01-18 ENCOUNTER — Encounter: Payer: Self-pay | Admitting: Family Medicine

## 2017-01-18 ENCOUNTER — Ambulatory Visit: Payer: 59 | Admitting: Family Medicine

## 2017-01-18 VITALS — BP 138/84 | HR 90 | Temp 98.0°F | Resp 16 | Ht 67.0 in | Wt 340.2 lb

## 2017-01-18 DIAGNOSIS — F321 Major depressive disorder, single episode, moderate: Secondary | ICD-10-CM | POA: Diagnosis not present

## 2017-01-18 DIAGNOSIS — Z131 Encounter for screening for diabetes mellitus: Secondary | ICD-10-CM

## 2017-01-18 DIAGNOSIS — D500 Iron deficiency anemia secondary to blood loss (chronic): Secondary | ICD-10-CM | POA: Diagnosis not present

## 2017-01-18 DIAGNOSIS — I10 Essential (primary) hypertension: Secondary | ICD-10-CM

## 2017-01-18 DIAGNOSIS — E0590 Thyrotoxicosis, unspecified without thyrotoxic crisis or storm: Secondary | ICD-10-CM

## 2017-01-18 DIAGNOSIS — E538 Deficiency of other specified B group vitamins: Secondary | ICD-10-CM | POA: Diagnosis not present

## 2017-01-18 DIAGNOSIS — E278 Other specified disorders of adrenal gland: Secondary | ICD-10-CM

## 2017-01-18 DIAGNOSIS — E279 Disorder of adrenal gland, unspecified: Secondary | ICD-10-CM

## 2017-01-18 DIAGNOSIS — Z13220 Encounter for screening for lipoid disorders: Secondary | ICD-10-CM

## 2017-01-18 DIAGNOSIS — I422 Other hypertrophic cardiomyopathy: Secondary | ICD-10-CM

## 2017-01-18 DIAGNOSIS — Z923 Personal history of irradiation: Secondary | ICD-10-CM

## 2017-01-18 DIAGNOSIS — E0520 Thyrotoxicosis with toxic multinodular goiter without thyrotoxic crisis or storm: Secondary | ICD-10-CM | POA: Diagnosis not present

## 2017-01-18 DIAGNOSIS — Z86718 Personal history of other venous thrombosis and embolism: Secondary | ICD-10-CM

## 2017-01-18 MED ORDER — LISINOPRIL 20 MG PO TABS: 20.0000 mg | ORAL_TABLET | Freq: Every day | ORAL | 0 refills | Status: DC

## 2017-01-18 NOTE — Progress Notes (Signed)
Name: Madeline Dominguez   MRN: [id number]    DOB: 1965-09-15   Date:01/18/2017       Progress Note  Subjective  Chief Complaint  Chief Complaint  Patient presents with  . Medication Refill  . Depression    Stable  . Hypertension    Edema in right ankle  . Gastroesophageal Reflux    Only takes medication as needed worst with tomato based foods   . Obesity  . Foot Pain    Right Heel Pain and would like Dr. Ancil Boozer to look at it. Hurts worst during the colder weather.    HPI  Cardiomyopathy: denies orthopnea or decrease in exercise tolerance, on lisinopril. No chest pain or palpitation  HTN: doing much better , taking medication as prescribed, mild swelling on legs but not bothersome. No chest pain or palpitation  Depression: she is feeling much better having normal energy level, smiling much more, seeing Dr. Valentina Gu and taking Zoloft, still feels a little anxious in am's but otherwise no sadness, crying spells, fatigue. She is coping well and is doing well at work   Adrenal nodule left: no symptoms, many tests done, seeing endocrinologist at [facility], and still being evaluated.  Goiter: no dysphagia, recently had [place], seeing endo, we will recheck TSH  Obesity: gained a little weight, but she states she feels better emotionally and appetite is back to normal.  GERD: taking medication prn only and is doing well  DVT history right leg: back to work and stands for many hours, states has recurrent right lower leg swelling at the end of the day, but no pain or redness and improves with elevation   Foot pain: she had an injury many years ago on the right heel, it never got checked in the past but she has noticed with cold weather pain has been present, described as a heating sensation on the area, no redness, no tenderness to touch, seems to be triggered by activity ( When walking for a long time, or when she first gets up after sitting for a prolonged period of time)   Patient Active  Problem List   Diagnosis Date Noted  . History of deep venous thrombosis (DVT) of distal vein of right lower extremity 01/18/2017  . Adrenal adenoma, left 09/11/2016  . Current moderate episode of major depressive disorder without prior episode ([place]) 08/10/2016  . 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 History of radioactive iodine thyroid ablation   . 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 Iron deficiency anemia 07/27/2015  . B12 deficiency 07/27/2015  . History of pulmonary embolus (PE) 07/25/2015  . Adrenal nodule ([place]) 07/25/2015  . Pulmonary nodules/lesions, multiple 07/25/2015  . OSA (obstructive sleep apnea) 05/18/2015  . Thyroid nodule 12/17/2014  . Heavy menstrual period 09/02/2014  . Gastroesophageal reflux disease without esophagitis 09/02/2014  . Morbid obesity, unspecified obesity type ([place]) 06/22/2014  . Large breasts 06/22/2014  . Anemia, iron deficiency 06/22/2014  . H/O hyperthyroidism 06/22/2014  . Right ventricular dilation 06/22/2014  . 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    Past Surgical History:  Procedure Laterality Date  . CESAREAN SECTION      Family History  Problem Relation Age of Onset  . CVA Father     Social History   Socioeconomic History  . Marital status: Married    Spouse name: Not on file  . Number of children: Not on file  . Years of  education: Not on file  . Highest education level: Not on file  Social Needs  . Financial resource strain: Not on file  . Food insecurity - worry: Not on file  . Food insecurity - inability: Not on file  . Transportation needs - medical: Not on file  . Transportation needs - non-medical: Not on file  Occupational History  . Not on file  Tobacco Use  . Smoking status: Never Smoker  . Smokeless tobacco: Never Used  Substance and Sexual Activity  . Alcohol use: No    Alcohol/week: 0.0 oz     Comment: rarely  . Drug use: No  . Sexual activity: Yes    Partners: Male    Birth control/protection: Condom  Other Topics Concern  . Not on file  Social History Narrative   Lives with 3 children, husband was incarcerated April 2018 - for stealing, she has not been keeping in touch with him because she is disappointed. 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      Current Outpatient Medications:  .  amLODipine (NORVASC) 5 MG tablet, Take 1 tablet by mouth daily., Disp: , Rfl:  .  cyclobenzaprine (FLEXERIL) 10 MG tablet, Take 10 mg by mouth 3 (three) times daily as needed for muscle spasms. , Disp: , Rfl:  .  ferrous sulfate 325 (65 FE) MG tablet, Take 1 tablet (325 mg total) by mouth 2 (two) times daily with a meal., Disp: 180 tablet, Rfl: 1 .  lisinopril (PRINIVIL,ZESTRIL) 20 MG tablet, Take 1 tablet (20 mg total) by mouth daily., Disp: 90 tablet, Rfl: 0 .  omeprazole (PRILOSEC) 40 MG capsule, Take 1 capsule (40 mg total) by mouth daily., Disp: 30 capsule, Rfl: 3 .  sertraline (ZOLOFT) 50 MG tablet, Take 1 tablet (50 mg total) by mouth daily., Disp: 90 tablet, Rfl: 0 .  vitamin B-12 1000 MCG tablet, Take 1 tablet (1,000 mcg total) by mouth daily., Disp: 30 tablet, Rfl: 0  Allergies  Allergen Reactions  . Aspirin Nausea And Vomiting  . Hctz [Hydrochlorothiazide] Other (See Comments)    Nausea, cramping     ROS  Constitutional: Negative for fever or weight change.  Respiratory: Negative for cough and shortness of breath.   Cardiovascular: Negative for chest pain or palpitations.  Gastrointestinal: Negative for abdominal pain, no bowel changes.  Musculoskeletal: Negative for gait problem or joint swelling.  Skin: Negative for rash.  Neurological: Negative for dizziness or headache.  No other specific complaints in a complete review of systems (except as listed in HPI above).   Objective  Vitals:   01/18/17 1100  BP: 138/84  Pulse:  90  Resp: 16  Temp: 98 F (36.7 C)  TempSrc: Oral  SpO2: 98%  Weight: (!) 340 lb 3.2 oz (154.3 kg)  Height: 5\' 7"  (1.702 m)    Body mass index is 53.28 kg/m.  Physical Exam  Constitutional: Patient appears well-developed and well-nourished. Obese  No distress.  HEENT: head atraumatic, normocephalic, pupils equal and reactive to light, eneck supple, throat within normal limits Cardiovascular: Normal rate, regular rhythm and normal heart sounds.  No murmur heard. No BLE edema. Pulmonary/Chest: Effort normal and breath sounds normal. No respiratory distress. Abdominal: Soft.  There is no tenderness. Psychiatric: Patient has a normal mood and affect. behavior is normal. Judgment and thought content normal. Muscular skeletal: hypertrophic area on the right heel, bony like, discussed x-ray but she would like to hold off  for now, she will try topical otc medication at first  PHQ2/9: Depression screen [facility] 2/9 01/18/2017 10/04/2016 06/22/2016 12/20/2015 09/02/2015  Decreased Interest 0 0 0 0 0  Down, Depressed, Hopeless 0 0 0 0 0  PHQ - 2 Score 0 0 0 0 0     Fall Risk: Fall Risk  01/18/2017 10/04/2016 06/22/2016 05/03/2016 12/20/2015  Falls in the past year? No No No No No     Functional Status Survey: Is the patient deaf or have difficulty hearing?: No Does the patient have difficulty seeing, even when wearing glasses/contacts?: No Does the patient have difficulty concentrating, remembering, or making decisions?: No Does the patient have difficulty walking or climbing stairs?: No Does the patient have difficulty dressing or bathing?: No Does the patient have difficulty doing errands alone such as visiting a doctor's office or shopping?: No    Assessment & Plan  1. Adrenal nodule ([place])  Seeing Endocrinologist   2. Morbid obesity, unspecified obesity type [facility])  Discussed with the patient the risk posed by an increased BMI. Discussed importance of portion control, calorie counting  and at least 150 minutes of physical activity weekly. Avoid sweet beverages and drink more water. Eat at least 6 servings of fruit and vegetables daily   3. 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 Current moderate episode of major depressive disorder without prior episode [facility])  Seeing psychiatrist and coping well at this time  5. Hypertension, benign  - COMPLETE METABOLIC PANEL WITH GFR - lisinopril (PRINIVIL,ZESTRIL) 20 MG tablet; Take 1 tablet (20 mg total) by mouth daily.  Dispense: 90 tablet; Refill: 0  6. Goiter, toxic, multinodular  - TSH  7. History of radioactive iodine thyroid ablation   8. Iron deficiency anemia due to chronic blood loss  Recheck level, no longer has cycles and not currently seeing hematologist - CBC with Differential/Platelet - Iron, TIBC and Ferritin Panel; Future  9. History of deep venous thrombosis (DVT) of distal vein of right lower extremity  Explained that swelling is common after DVT, it can improve with compression stocking hoses and elevation   10. B12 deficiency  - Vitamin B12  11. Diabetes mellitus screening  - Hemoglobin A1c  12. Screening, lipid  - Lipid panel

## 2017-01-19 LAB — CBC WITH DIFFERENTIAL/PLATELET
BASOS ABS: 20 {cells}/uL (ref 0–200)
BASOS PCT: 0.4 %
EOS ABS: 137 {cells}/uL (ref 15–500)
EOS PCT: 2.8 %
HEMATOCRIT: 34.2 % — AB (ref 35.0–45.0)
HEMOGLOBIN: 11.2 g/dL — AB (ref 11.7–15.5)
LYMPHS ABS: 2239 {cells}/uL (ref 850–3900)
MCH: 28.9 pg (ref 27.0–33.0)
MCHC: 32.7 g/dL (ref 32.0–36.0)
MCV: 88.1 fL (ref 80.0–100.0)
MPV: 10.8 fL (ref 7.5–12.5)
Monocytes Relative: 7.1 %
Neutro Abs: 2156 cells/uL (ref 1500–7800)
Neutrophils Relative %: 44 %
Platelets: 255 10*3/uL (ref 140–400)
RBC: 3.88 10*6/uL (ref 3.80–5.10)
RDW: 12.9 % (ref 11.0–15.0)
Total Lymphocyte: 45.7 %
WBC mixed population: 348 cells/uL (ref 200–950)
WBC: 4.9 10*3/uL (ref 3.8–10.8)

## 2017-01-19 LAB — COMPLETE METABOLIC PANEL WITH GFR
AG RATIO: 1.2 (calc) (ref 1.0–2.5)
ALKALINE PHOSPHATASE (APISO): 90 U/L (ref 33–115)
ALT: 7 U/L (ref 6–29)
AST: 10 U/L (ref 10–35)
Albumin: 3.7 g/dL (ref 3.6–5.1)
BUN: 10 mg/dL (ref 7–25)
CO2: 29 mmol/L (ref 20–32)
Calcium: 9.1 mg/dL (ref 8.6–10.2)
Chloride: 105 mmol/L (ref 98–110)
Creat: 0.68 mg/dL (ref 0.50–1.10)
GFR, EST NON AFRICAN AMERICAN: 103 mL/min/{1.73_m2} (ref >=60)
GFR, Est African American: 119 mL/min/{1.73_m2} (ref >=60)
GLOBULIN: 3.2 g/dL (ref 1.9–3.7)
Glucose, Bld: 96 mg/dL (ref 65–99)
Potassium: 4.1 mmol/L (ref 3.5–5.3)
SODIUM: 141 mmol/L (ref 135–146)
Total Bilirubin: 0.5 mg/dL (ref 0.2–1.2)
Total Protein: 6.9 g/dL (ref 6.1–8.1)

## 2017-01-19 LAB — TSH: TSH: 4.22 mIU/L

## 2017-01-19 LAB — HEMOGLOBIN A1C
HEMOGLOBIN A1C: 5 %{Hb} (ref <5.7)
Mean Plasma Glucose: 97 (calc)
eAG (mmol/L): 5.4 (calc)

## 2017-01-19 LAB — LIPID PANEL
CHOL/HDL RATIO: 2.4 (calc) (ref <5.0)
CHOLESTEROL: 197 mg/dL (ref <200)
HDL: 81 mg/dL (ref >50)
LDL Cholesterol (Calc): 102 mg/dL (calc) — ABNORMAL HIGH
NON-HDL CHOLESTEROL (CALC): 116 mg/dL (ref <130)
TRIGLYCERIDES: 53 mg/dL (ref <150)

## 2017-01-19 LAB — VITAMIN B12: Vitamin B-12: 430 pg/mL (ref 200–1100)

## 2017-02-03 NOTE — Progress Notes (Deleted)
Name: Madeline Dominguez   MRN: [id number]    DOB: [birth year]   Date:02/03/2017       Progress Note  Subjective  Chief Complaint  No chief complaint on file.   HPI  *** Patient presents for annual CPE ***.  Diet: *** Exercise: ***   USPSTF grade A and B recommendations  Depression:  Depression screen [facility] 2/9 01/18/2017 10/04/2016 06/22/2016 12/20/2015 09/02/2015  Decreased Interest 0 0 0 0 0  Down, Depressed, Hopeless 0 0 0 0 0  PHQ - 2 Score 0 0 0 0 0   Hypertension: BP Readings from Last 3 Encounters:  01/18/17 138/84  10/04/16 136/84  09/04/16 132/78   Obesity: Wt Readings from Last 3 Encounters:  01/18/17 (!) 340 lb 3.2 oz (154.3 kg)  10/04/16 (!) 331 lb 8 oz (150.4 kg)  09/04/16 (!) 326 lb 3 oz (148 kg)   BMI Readings from Last 3 Encounters:  01/18/17 53.28 kg/m  10/04/16 51.92 kg/m  09/04/16 51.09 kg/m    Alcohol: *** Tobacco use: *** HIV, hep B, hep C: *** STD testing and prevention (chl/gon/syphilis): *** Intimate partner violence:*** Sexual History/Pain during Intercourse: Menstrual History/LMP/Abnormal Bleeding: Incontinence Symptoms:   Advanced Care Planning: A voluntary discussion about advance care planning including the explanation and discussion of advance directives.  Discussed health care proxy and Living will, and the patient was able to identify a health care proxy as ***.  Patient {DOES_DOES OHY:07371} have a living will at present time. If patient does have living will, I have requested they bring this to the clinic to be scanned in to their chart.  Breast cancer: due for repeat 06/2016 BRCA gene screening: *** Cervical cancer screening: ***   Lipids:  Lab Results  Component Value Date   CHOL 197 01/18/2017   CHOL 178 04/27/2016   CHOL 190 12/03/2014   Lab Results  Component Value Date   HDL 81 01/18/2017   HDL 68 04/27/2016   HDL 74 12/03/2014   Lab Results  Component Value Date   LDLCALC 99 04/27/2016   LDLCALC 105 (H)  12/03/2014   Lab Results  Component Value Date   TRIG 53 01/18/2017   TRIG 53 04/27/2016   TRIG 56 12/03/2014   Lab Results  Component Value Date   CHOLHDL 2.4 01/18/2017   CHOLHDL 2.6 04/27/2016   CHOLHDL 2.6 12/03/2014   No results found for: LDLDIRECT  Glucose:  Glucose, Bld  Date Value Ref Range Status  01/18/2017 96 65 - 99 mg/dL Final    Comment:    .            Fasting reference interval .   07/24/2016 115 (H) 65 - 99 mg/dL Final  06/30/2016 99 65 - 99 mg/dL Final   Glucose-Capillary  Date Value Ref Range Status  06/13/2016 106 (H) 65 - 99 mg/dL Final     Colorectal cancer: schedule now Lung cancer: Low Dose CT Chest recommended if Age 50-80 years, 30 pack-year currently smoking OR have quit w/in 15years. Patient {DOES NOT does:27190::"does not"} qualify.   Aspirin: she is allergic to aspirin ECG:08/2016   Patient Active Problem List   Diagnosis Date Noted  . History of deep venous thrombosis (DVT) of distal vein of right lower extremity 01/18/2017  . Adrenal adenoma, left 09/11/2016  . Current moderate episode of major depressive disorder without prior episode ([place]) 08/10/2016  . 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 History of radioactive iodine thyroid ablation   .  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 Iron deficiency anemia 07/27/2015  . B12 deficiency 07/27/2015  . History of pulmonary embolus (PE) 07/25/2015  . Adrenal nodule ([place]) 07/25/2015  . Pulmonary nodules/lesions, multiple 07/25/2015  . OSA (obstructive sleep apnea) 05/18/2015  . Thyroid nodule 12/17/2014  . Heavy menstrual period 09/02/2014  . Gastroesophageal reflux disease without esophagitis 09/02/2014  . Morbid obesity, unspecified obesity type ([place]) 06/22/2014  . Large breasts 06/22/2014  . Anemia, iron deficiency 06/22/2014  . H/O hyperthyroidism 06/22/2014  . Right ventricular dilation 06/22/2014  . Goiter, toxic,  multinodular 05/22/2013  . 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    Past Surgical History:  Procedure Laterality Date  . CESAREAN SECTION      Family History  Problem Relation Age of Onset  . CVA Father     Social History   Socioeconomic History  . Marital status: Married    Spouse name: Not on file  . Number of children: Not on file  . Years of education: Not on file  . Highest education level: Not on file  Social Needs  . Financial resource strain: Not on file  . Food insecurity - worry: Not on file  . Food insecurity - inability: Not on file  . Transportation needs - medical: Not on file  . Transportation needs - non-medical: Not on file  Occupational History  . Not on file  Tobacco Use  . Smoking status: Never Smoker  . Smokeless tobacco: Never Used  Substance and Sexual Activity  . Alcohol use: No    Alcohol/week: 0.0 oz    Comment: rarely  . Drug use: No  . Sexual activity: Yes    Partners: Male    Birth control/protection: Condom  Other Topics Concern  . Not on file  Social History Narrative   Lives with 3 children, husband was incarcerated April 2018 - for stealing, she has not been keeping in touch with him because she is disappointed. 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      Current Outpatient Medications:  .  amLODipine (NORVASC) 5 MG tablet, Take 1 tablet by mouth daily., Disp: , Rfl:  .  cyclobenzaprine (FLEXERIL) 10 MG tablet, Take 10 mg by mouth 3 (three) times daily as needed for muscle spasms. , Disp: , Rfl:  .  ferrous sulfate 325 (65 FE) MG tablet, Take 1 tablet (325 mg total) by mouth 2 (two) times daily with a meal., Disp: 180 tablet, Rfl: 1 .  lisinopril (PRINIVIL,ZESTRIL) 20 MG tablet, Take 1 tablet (20 mg total) by mouth daily., Disp: 90 tablet, Rfl: 0 .  omeprazole (PRILOSEC) 40 MG capsule, Take 1 capsule (40 mg total) by mouth daily., Disp: 30 capsule, Rfl: 3 .  sertraline (ZOLOFT)  50 MG tablet, Take 1 tablet (50 mg total) by mouth daily., Disp: 90 tablet, Rfl: 0 .  vitamin B-12 1000 MCG tablet, Take 1 tablet (1,000 mcg total) by mouth daily., Disp: 30 tablet, Rfl: 0  Allergies  Allergen Reactions  . Aspirin Nausea And Vomiting  . Hctz [Hydrochlorothiazide] Other (See Comments)    Nausea, cramping     ROS  *** Constitutional: Negative for fever or weight change.  Respiratory: Negative for cough and shortness of breath.   Cardiovascular: Negative for chest pain or palpitations.  Gastrointestinal: Negative for abdominal pain, no bowel changes.  Musculoskeletal: Negative for gait problem or joint swelling.  Skin: Negative for rash.  Neurological:  Negative for dizziness or headache.  No other specific complaints in a complete review of systems (except as listed in HPI above).   Objective  There were no vitals filed for this visit.  There is no height or weight on file to calculate BMI.  Physical Exam *** Constitutional: Patient appears well-developed and well-nourished. No distress.  HENT: Head: Normocephalic and atraumatic. Ears: B TMs ok, no erythema or effusion; Nose: Nose normal. Mouth/Throat: Oropharynx is clear and moist. No oropharyngeal exudate.  Eyes: Conjunctivae and EOM are normal. Pupils are equal, round, and reactive to light. No scleral icterus.  Neck: Normal range of motion. Neck supple. No JVD present. No thyromegaly present.  Cardiovascular: Normal rate, regular rhythm and normal heart sounds.  No murmur heard. No BLE edema. Pulmonary/Chest: Effort normal and breath sounds normal. No respiratory distress. Abdominal: Soft. Bowel sounds are normal, no distension. There is no tenderness. no masses Breast: no lumps or masses, no nipple discharge or rashes FEMALE GENITALIA: *** External genitalia normal External urethra normal Vaginal vault normal without discharge or lesions Cervix normal without discharge or lesions Bimanual exam normal  without masses RECTAL: no rectal masses or hemorrhoids Musculoskeletal: Normal range of motion, no joint effusions. No gross deformities Neurological: he is alert and oriented to person, place, and time. No cranial nerve deficit. Coordination, balance, strength, speech and gait are normal.  Skin: Skin is warm and dry. No rash noted. No erythema.  Psychiatric: Patient has a normal mood and affect. behavior is normal. Judgment and thought content normal.   Recent Results (from the past 2160 hour(s))  Hemoglobin A1c     Status: None   Collection Time: 01/18/17 11:53 AM  Result Value Ref Range   Hgb A1c MFr Bld 5.0 <5.7 % of total Hgb    Comment: For the purpose of screening for the presence of diabetes: . <5.7%       Consistent with the absence of diabetes 5.7-6.4%    Consistent with increased risk for diabetes             (prediabetes) > or =6.5%  Consistent with diabetes . This assay result is consistent with a decreased risk of diabetes. . Currently, no consensus exists regarding use of hemoglobin A1c for diagnosis of diabetes in children. . According to American Diabetes Association (ADA) guidelines, hemoglobin A1c <7.0% represents optimal control in non-pregnant diabetic patients. Different metrics may apply to specific patient populations.  Standards of Medical Care in Diabetes(ADA). .    Mean Plasma Glucose 97 (calc)   eAG (mmol/L) 5.4 (calc)  Lipid panel     Status: Abnormal   Collection Time: 01/18/17 11:53 AM  Result Value Ref Range   Cholesterol 197 <200 mg/dL   HDL 81 >50 mg/dL   Triglycerides 53 <150 mg/dL   LDL Cholesterol (Calc) 102 (H) mg/dL (calc)    Comment: Reference range: <100 . Desirable range <100 mg/dL for primary prevention;   <70 mg/dL for patients with CHD or diabetic patients  with > or = 2 CHD risk factors. Marland Kitchen LDL-C is now calculated using the Martin-Hopkins  calculation, which is a validated novel method providing  better accuracy than the  Friedewald equation in the  estimation of LDL-C.  Cresenciano Genre et al. Annamaria Helling. 5329;924(26): 2061-2068  (http://education.[employer].com/faq/FAQ164)    Total CHOL/HDL Ratio 2.4 <5.0 (calc)   Non-HDL Cholesterol (Calc) 116 <130 mg/dL (calc)    Comment: For patients with diabetes plus 1 major ASCVD risk  factor, treating to a  non-HDL-C goal of <100 mg/dL  (LDL-C of <70 mg/dL) is considered a therapeutic  option.   COMPLETE METABOLIC PANEL WITH GFR     Status: None   Collection Time: 01/18/17 11:53 AM  Result Value Ref Range   Glucose, Bld 96 65 - 99 mg/dL    Comment: .            Fasting reference interval .    BUN 10 7 - 25 mg/dL   Creat 0.68 0.50 - 1.10 mg/dL   GFR, Est Non African American 103 > OR = 60 mL/min/1.40m   GFR, Est African American 119 > OR = 60 mL/min/1.785m  BUN/Creatinine Ratio NOT APPLICABLE 6 - 22 (calc)   Sodium 141 135 - 146 mmol/L   Potassium 4.1 3.5 - 5.3 mmol/L   Chloride 105 98 - 110 mmol/L   CO2 29 20 - 32 mmol/L   Calcium 9.1 8.6 - 10.2 mg/dL   Total Protein 6.9 6.1 - 8.1 g/dL   Albumin 3.7 3.6 - 5.1 g/dL   Globulin 3.2 1.9 - 3.7 g/dL (calc)   AG Ratio 1.2 1.0 - 2.5 (calc)   Total Bilirubin 0.5 0.2 - 1.2 mg/dL   Alkaline phosphatase (APISO) 90 33 - 115 U/L   AST 10 10 - 35 U/L   ALT 7 6 - 29 U/L  Vitamin B12     Status: None   Collection Time: 01/18/17 11:53 AM  Result Value Ref Range   Vitamin B-12 430 200 - 1,100 pg/mL  TSH     Status: None   Collection Time: 01/18/17 11:53 AM  Result Value Ref Range   TSH 4.22 mIU/L    Comment:           Reference Range .           > or = 20 Years  0.40-4.50 .                Pregnancy Ranges           First trimester    0.26-2.66           Second trimester   0.55-2.73           Third trimester    0.43-2.91   CBC with Differential/Platelet     Status: Abnormal   Collection Time: 01/18/17 11:53 AM  Result Value Ref Range   WBC 4.9 3.8 - 10.8 Thousand/uL   RBC 3.88 3.80 - 5.10 Million/uL    Hemoglobin 11.2 (L) 11.7 - 15.5 g/dL   HCT 34.2 (L) 35.0 - 45.0 %   MCV 88.1 80.0 - 100.0 fL   MCH 28.9 27.0 - 33.0 pg   MCHC 32.7 32.0 - 36.0 g/dL   RDW 12.9 11.0 - 15.0 %   Platelets 255 140 - 400 Thousand/uL   MPV 10.8 7.5 - 12.5 fL   Neutro Abs 2,156 1,500 - 7,800 cells/uL   Lymphs Abs 2,239 850 - 3,900 cells/uL   WBC mixed population 348 200 - 950 cells/uL   Eosinophils Absolute 137 15 - 500 cells/uL   Basophils Absolute 20 0 - 200 cells/uL   Neutrophils Relative % 44 %   Total Lymphocyte 45.7 %   Monocytes Relative 7.1 %   Eosinophils Relative 2.8 %   Basophils Relative 0.4 %      PH[facility]2/9: [facility] 01/18/2017 10/04/2016 06/22/2016 12/20/2015 09/02/2015  Decreased Interest 0 0 0 0 0  Down, Depressed, Hopeless 0 0 0 0  0  PHQ - 2 Score 0 0 0 0 0   ***  Fall Risk: Fall Risk  01/18/2017 10/04/2016 06/22/2016 05/03/2016 12/20/2015  Falls in the past year? No No No No No   ***  Functional Status Survey:   ***  Assessment & Plan  1. Well woman exam  Discussed importance of 150 minutes of physical activity weekly, eat two servings of fish weekly, eat one serving of tree nuts ( cashews, pistachios, pecans, almonds.Marland Kitchen) every other day, eat 6 servings of fruit/vegetables daily and drink plenty of water and avoid sweet beverages.   2. Breast cancer screening ***  3. Colon cancer screening ***

## 2017-02-04 ENCOUNTER — Encounter: Payer: 59 | Admitting: Family Medicine

## 2017-03-04 ENCOUNTER — Other Ambulatory Visit: Payer: Self-pay | Admitting: Family Medicine

## 2017-03-04 DIAGNOSIS — I10 Essential (primary) hypertension: Secondary | ICD-10-CM

## 2017-03-18 NOTE — Progress Notes (Signed)
Elizabeth Lake MD/PA/NP OP Progress Note  03/19/2017 1:34 PM Madeline Dominguez  MRN:  [id number]  Chief Complaint:  Chief Complaint    Depression; Follow-up     HPI:  Patient presents for follow-up appointment for depression. She states that she tends to get stress at work.  She works at third shift and feels tired now. She notices that she tends to feel more anxious around menstrual cycle. She has been talking with her husband and he will try substance abuse program while incarcerated. Although she is still unsure what she will do about the relationship, she will first work on herself. She reports good relationship with her children and enjoyed going to the beach last weekend. She has weight gain; she eats unhealthy food and sleep. She is willing to change her diet and do more exercise. She denies insomnia. She has more motivation and energy. She has fair concentration. She denies SI. She feels anxious and tense at times. She denies panic attacks.   Wt Readings from Last 3 Encounters:  03/19/17 (!) 350 lb (158.8 kg)  01/18/17 (!) 340 lb 3.2 oz (154.3 kg)  12/19/16 (!) 338 lb (153.3 kg)   Visit Diagnosis:    ICD-10-CM   1. Current moderate episode of major depressive disorder without prior episode ([place]) F32.1     Past Psychiatric History:  I have reviewed the patient's psychiatry history in detail and updated the patient record. Outpatient: denies Psychiatry admission: denies Previous suicide attempt: denies Past trials of medication: denies History of violence:  Past Medical History:  Past Medical History:  Diagnosis Date  . Adrenal adenoma    a. normal cortisol and aldosterone levels  . Cough 07/25/2015  . Dilated cardiomyopathy ([place])    a. TTE 2/15: EF of 55-60%, dilated LV & LA, and nl RV; b. TTE 6/16:EF 55-60%, no RWMA, nl LV dias fxn, LV cavity size nl with mild concentric LVH, mildly dilated ascending aorta, LA nl size, RV sys fxn nl, PASP nl; c. TTE 7/17: EF 45-50%, unable to exclude  RWMA, nl LV dias faxn, mildly dilated RV w/ nl sys fxn, PASP nl; d. TTE 7/18: EF 45-50%, mild LVH, Gr1DD, mildly dilated LA, mildly dilated RV  . Goiter    a. s/p radioactive iodine treatment in 2010  . Heart palpitations   . Hypertension   . Iron deficiency   . Large breasts   . Morbid obesity ([place])   . Postpartum hemorrhage   . Pulmonary embolism ([place]) 07/2015   a. s/p Eliquis; b. hypercoag work up neg; c. felt to be 2/2 OCP    Past Surgical History:  Procedure Laterality Date  . CESAREAN SECTION      Family Psychiatric History:  I have reviewed the patient's family history in detail and updated the patient record.  Family History:  Family History  Problem Relation Age of Onset  . CVA Father     Social History:  Social History   Socioeconomic History  . Marital status: Married    Spouse name: None  . Number of children: None  . Years of education: None  . Highest education level: None  Social Needs  . Financial resource strain: None  . Food insecurity - worry: None  . Food insecurity - inability: None  . Transportation needs - medical: None  . Transportation needs - non-medical: None  Occupational History  . None  Tobacco Use  . Smoking status: Never Smoker  . Smokeless tobacco: Never Used  Substance and  Sexual Activity  . Alcohol use: No    Alcohol/week: 0.0 oz    Comment: rarely  . Drug use: No  . Sexual activity: Yes    Partners: Male    Birth control/protection: Condom  Other Topics Concern  . None  Social History Narrative   Lives with 3 children, husband was incarcerated April 2018 - for stealing, she has not been keeping in touch with him because she is disappointed. 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    Education: 12 th grade She grew up in [place], raised by her mother who was a single parent, she has two sisters and one brother. 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 She was a single parent for 25 years.  She lives with her three children (25, 97, 14)   Allergies:  Allergies  Allergen Reactions  . Aspirin Nausea And Vomiting  . Hctz [Hydrochlorothiazide] Other (See Comments)    Nausea, cramping    Metabolic Disorder Labs: Lab Results  Component Value Date   HGBA1C 5.0 01/18/2017   MPG 97 01/18/2017   MPG 94 04/27/2016   No results found for: PROLACTIN Lab Results  Component Value Date   CHOL 197 01/18/2017   TRIG 53 01/18/2017   HDL 81 01/18/2017   CHOLHDL 2.4 01/18/2017   VLDL 11 04/27/2016   LDLCALC 102 (H) 01/18/2017   LDLCALC 99 04/27/2016   Lab Results  Component Value Date   TSH 4.22 01/18/2017   TSH 2.21 08/10/2016    Therapeutic Level Labs: No results found for: LITHIUM No results found for: VALPROATE No components found for:  CBMZ  Current Medications: Current Outpatient Medications  Medication Sig Dispense Refill  . amLODipine (NORVASC) 5 MG tablet Take 1 tablet by mouth daily.    . cyclobenzaprine (FLEXERIL) 10 MG tablet Take 10 mg by mouth 3 (three) times daily as needed for muscle spasms.     . ferrous sulfate 325 (65 FE) MG tablet Take 1 tablet (325 mg total) by mouth 2 (two) times daily with a meal. 180 tablet 1  . lisinopril (PRINIVIL,ZESTRIL) 20 MG tablet Take 1 tablet (20 mg total) by mouth daily. 90 tablet 0  . omeprazole (PRILOSEC) 40 MG capsule Take 1 capsule (40 mg total) by mouth daily. 30 capsule 3  . sertraline (ZOLOFT) 50 MG tablet Take 1.5 tablets (75 mg total) by mouth daily. 135 tablet 0  . vitamin B-12 1000 MCG tablet Take 1 tablet (1,000 mcg total) by mouth daily. 30 tablet 0   No current facility-administered medications for this visit.      Musculoskeletal: Strength & Muscle Tone: within normal  limits Gait & Station: normal Patient leans: N/A  Psychiatric Specialty Exam: Review of Systems  Psychiatric/Behavioral: Positive for depression. Negative for hallucinations, memory loss, substance abuse and suicidal ideas. The patient is nervous/anxious. The patient does not have insomnia.   All other systems reviewed and are negative.   Blood pressure (!) 166/107, pulse 70, height 5\' 7"  (1.702 m), weight (!) 350 lb (158.8 kg), SpO2 95 %.Body mass index is 54.82 kg/m.  General Appearance: Fairly Groomed  Eye Contact:  Fair  Speech:  Clear and Coherent  Volume:  Normal  Mood:  Anxious  Affect:  Appropriate, Congruent  and less restrictive  Thought Process:  Coherent and Goal Directed  Orientation:  Full (Time, Place, and Person)  Thought Content: Logical   Suicidal Thoughts:  No  Homicidal Thoughts:  No  Memory:  Immediate;   Good Recent;   Good Remote;   Good  Judgement:  Good  Insight:  Fair  Psychomotor Activity:  Normal  Concentration:  Concentration: Good and Attention Span: Good  Recall:  Good  Fund of Knowledge: Good  Language: Good  Akathisia:  No  Handed:  Right  AIMS (if indicated): not done  Assets:  Communication Skills Desire for Improvement  ADL's:  Intact  Cognition: WNL  Sleep:  Good   Screenings: PHQ2-9     Office Visit from 01/18/2017 in [facility] Office Visit from 10/04/2016 in [facility] Office Visit from 06/22/2016 in [facility] Office Visit from 12/20/2015 in [facility] Office Visit from 09/02/2015 in [facility]  PHQ-2 Total Score  0  0  0  0  0       Assessment and Plan:  Madeline Dominguez is a 52 y.o. year old female with a history of depression, hypertension, dilated cardiomyopathy, adrenal adenoma (undergoing evaluation), bilateral pulmonary embolism, obesity, obstructive sleep apnea on CPAP, history of toxic multinodular goiter, who presents  for follow up appointment for Current moderate episode of major depressive disorder without prior episode ([place])  # MDD, moderate, recurrent without psychotic features Although there has been overall improvement in her neurovegetative symptoms, she reports occasional flare up of symptoms along with menstrual cycle. Will uptitrate sertraline to target residual mood symptoms. Discussed healthy boundary. Discussed behavioral activation. Will hold therapy at this time given improvement in her symptoms.   Plan 1. Increase sertraline 75 mg daily  2. Return to clinic in thee months for 15 mins 3. Eat healthier food and try some exercise  The patient demonstrates the following risk factors for suicide: Chronic risk factors for suicide include: psychiatric disorder of depression. 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 Protective factorsfor this patient include: responsibility to others (children, family) and coping skills. Considering these factors, the overall suicide risk at this point appears to be low. Patient isappropriate for outpatient follow up.  The duration of this appointment visit was 30 minutes of face-to-face time with the patient.  Greater than 50% of this time was spent in counseling, explanation of  diagnosis, planning of further management, and coordination of care.  Madeline Clay, MD 03/19/2017, 1:34 PM

## 2017-03-19 ENCOUNTER — Encounter (HOSPITAL_COMMUNITY): Payer: Self-pay | Admitting: Psychiatry

## 2017-03-19 ENCOUNTER — Ambulatory Visit (INDEPENDENT_AMBULATORY_CARE_PROVIDER_SITE_OTHER): Payer: 59 | Admitting: Psychiatry

## 2017-03-19 VITALS — BP 166/107 | HR 70 | Ht 67.0 in | Wt 350.0 lb

## 2017-03-19 DIAGNOSIS — F419 Anxiety disorder, unspecified: Secondary | ICD-10-CM | POA: Diagnosis not present

## 2017-03-19 DIAGNOSIS — R450 Nervousness: Secondary | ICD-10-CM

## 2017-03-19 DIAGNOSIS — F321 Major depressive disorder, single episode, moderate: Secondary | ICD-10-CM

## 2017-03-19 DIAGNOSIS — Z6379 Other stressful life events affecting family and household: Secondary | ICD-10-CM

## 2017-03-19 MED ORDER — SERTRALINE HCL 50 MG PO TABS: 75.0000 mg | ORAL_TABLET | Freq: Every day | ORAL | 0 refills | Status: DC

## 2017-03-19 NOTE — Patient Instructions (Signed)
1. Increase sertraline 75 mg daily  2. Return to clinic in thee months for 15 mins 3. Eat healthier food and try some exercise

## 2017-03-25 ENCOUNTER — Encounter: Payer: Self-pay | Admitting: Family Medicine

## 2017-03-25 ENCOUNTER — Ambulatory Visit (INDEPENDENT_AMBULATORY_CARE_PROVIDER_SITE_OTHER): Payer: 59 | Admitting: Family Medicine

## 2017-03-25 VITALS — BP 134/78 | HR 73 | Temp 98.2°F | Resp 18 | Ht 67.0 in | Wt 353.7 lb

## 2017-03-25 DIAGNOSIS — Z1211 Encounter for screening for malignant neoplasm of colon: Secondary | ICD-10-CM | POA: Diagnosis not present

## 2017-03-25 DIAGNOSIS — Z01419 Encounter for gynecological examination (general) (routine) without abnormal findings: Secondary | ICD-10-CM

## 2017-03-25 DIAGNOSIS — Z1239 Encounter for other screening for malignant neoplasm of breast: Secondary | ICD-10-CM

## 2017-03-25 DIAGNOSIS — N393 Stress incontinence (female) (male): Secondary | ICD-10-CM

## 2017-03-25 DIAGNOSIS — B977 Papillomavirus as the cause of diseases classified elsewhere: Secondary | ICD-10-CM

## 2017-03-25 DIAGNOSIS — Z124 Encounter for screening for malignant neoplasm of cervix: Secondary | ICD-10-CM

## 2017-03-25 DIAGNOSIS — Z1231 Encounter for screening mammogram for malignant neoplasm of breast: Secondary | ICD-10-CM | POA: Diagnosis not present

## 2017-03-25 DIAGNOSIS — Z1212 Encounter for screening for malignant neoplasm of rectum: Secondary | ICD-10-CM

## 2017-03-25 DIAGNOSIS — Z113 Encounter for screening for infections with a predominantly sexual mode of transmission: Secondary | ICD-10-CM

## 2017-03-25 NOTE — Patient Instructions (Addendum)
Kegel Exercises Kegel exercises help strengthen the muscles that support the rectum, vagina, small intestine, bladder, and uterus. Doing Kegel exercises can help:  Improve bladder and bowel control.  Improve sexual response.  Reduce problems and discomfort during pregnancy.  Kegel exercises involve squeezing your pelvic floor muscles, which are the same muscles you squeeze when you try to stop the flow of urine. The exercises can be done while sitting, standing, or lying down, but it is best to vary your position. Phase 1 exercises 1. Squeeze your pelvic floor muscles tight. You should feel a tight lift in your rectal area. If you are a female, you should also feel a tightness in your vaginal area. Keep your stomach, buttocks, and legs relaxed. 2. Hold the muscles tight for up to 10 seconds. 3. Relax your muscles. Repeat this exercise 50 times a day or as many times as told by your health care provider. Continue to do this exercise for at least 4-6 weeks or for as long as told by your health care provider. This information is not intended to replace advice given to you by your health care provider. Make sure you discuss any questions you have with your health care provider. Document Released: 12/05/2011 Document Revised: 08/13/2015 Document Reviewed: 11/07/2014 Elsevier Interactive Patient Education  2018 [place] Years, Female Preventive care refers to lifestyle choices and visits with your health care provider that can promote health and wellness. What does preventive care include?  A yearly physical exam. This is also called an annual well check.  Dental exams once or twice a year.  Routine eye exams. Ask your health care provider how often you should have your eyes checked.  Personal lifestyle choices, including: ? Daily care of your teeth and gums. ? Regular physical activity. ? Eating a healthy diet. ? Avoiding tobacco and drug use. ? Limiting  alcohol use. ? Practicing safe sex. ? Taking low-dose aspirin daily starting at age 56. ? Taking vitamin and mineral supplements as recommended by your health care provider. What happens during an annual well check? The services and screenings done by your health care provider during your annual well check will depend on your age, overall health, lifestyle risk factors, and family history of disease. Counseling Your health care provider may ask you questions about your:  Alcohol use.  Tobacco use.  Drug use.  Emotional well-being.  Home and relationship well-being.  Sexual activity.  Eating habits.  Work and work Statistician.  Method of birth control.  Menstrual cycle.  Pregnancy history.  Screening You may have the following tests or measurements:  Height, weight, and BMI.  Blood pressure.  Lipid and cholesterol levels. These may be checked every 5 years, or more frequently if you are over 62 years old.  Skin check.  Lung cancer screening. You may have this screening every year starting at age 37 if you have a 30-pack-year history of smoking and currently smoke or have quit within the past 15 years.  Fecal occult blood test (FOBT) of the stool. You may have this test every year starting at age 76.  Flexible sigmoidoscopy or colonoscopy. You may have a sigmoidoscopy every 5 years or a colonoscopy every 10 years starting at age 34.  Hepatitis C blood test.  Hepatitis B blood test.  Sexually transmitted disease (STD) testing.  Diabetes screening. This is done by checking your blood sugar (glucose) after you have not eaten for a while (fasting). You may have this  done every 1-3 years.  Mammogram. This may be done every 1-2 years. Talk to your health care provider about when you should start having regular mammograms. This may depend on whether you have a family history of breast cancer.  BRCA-related cancer screening. This may be done if you have a family  history of breast, ovarian, tubal, or peritoneal cancers.  Pelvic exam and Pap test. This may be done every 3 years starting at age 2. Starting at age 75, this may be done every 5 years if you have a Pap test in combination with an HPV test.  Bone density scan. This is done to screen for osteoporosis. You may have this scan if you are at high risk for osteoporosis.  Discuss your test results, treatment options, and if necessary, the need for more tests with your health care provider. Vaccines Your health care provider may recommend certain vaccines, such as:  Influenza vaccine. This is recommended every year.  Tetanus, diphtheria, and acellular pertussis (Tdap, Td) vaccine. You may need a Td booster every 10 years.  Varicella vaccine. You may need this if you have not been vaccinated.  Zoster vaccine. You may need this after age 30.  Measles, mumps, and rubella (MMR) vaccine. You may need at least one dose of MMR if you were born in 1957 or later. You may also need a second dose.  Pneumococcal 13-valent conjugate (PCV13) vaccine. You may need this if you have certain conditions and were not previously vaccinated.  Pneumococcal polysaccharide (PPSV23) vaccine. You may need one or two doses if you smoke cigarettes or if you have certain conditions.  Meningococcal vaccine. You may need this if you have certain conditions.  Hepatitis A vaccine. You may need this if you have certain conditions or if you travel or work in places where you may be exposed to hepatitis A.  Hepatitis B vaccine. You may need this if you have certain conditions or if you travel or work in places where you may be exposed to hepatitis B.  Haemophilus influenzae type b (Hib) vaccine. You may need this if you have certain conditions.  Talk to your health care provider about which screenings and vaccines you need and how often you need them. This information is not intended to replace advice given to you by your  health care provider. Make sure you discuss any questions you have with your health care provider. Document Released: 01/14/2015 Document Revised: 09/07/2015 Document Reviewed: 10/19/2014 Elsevier Interactive Patient Education  [employer].

## 2017-03-25 NOTE — Progress Notes (Signed)
Name: Madeline Dominguez   MRN: [id number]    DOB: 1965/06/08   Date:03/25/2017       Progress Note  Subjective  Chief Complaint  Chief Complaint  Patient presents with  . Annual Exam    HPI  Patient presents for annual CPE.  She had recent fasting labs performed by PCP Dr. Ancil Boozer on 01/18/2017.  Diet: Works 3rd shift; will have something quick before she goes home from her shift; may eat something when she wakes up 4-5pm.  Does eat some fruits and vegetables - does eat vegetables about 4 times a week. Eats out about 3 times a week. Exercise: Works for [employer] and does a lot of walking at work regularly. Otherwise no exercise otherwise  USPSTF grade A and B recommendations  Depression: States she is doing a lot better; her psychiatrist adjusted her Xoloft to 69m daily recently and is doing well on this dose. [facility] 01/18/2017 10/04/2016 06/22/2016 12/20/2015 09/02/2015  Decreased Interest 0 0 0 0 0  Down, Depressed, Hopeless 0 0 0 0 0  PHQ - 2 Score 0 0 0 0 0   Hypertension: BP Readings from Last 3 Encounters:  03/25/17 134/78  01/18/17 138/84  10/04/16 136/84   Obesity: Appetite recently back to normal after depression medication adjustment.  Has seen nutritionist in the past - declines new referral. Wt Readings from Last 3 Encounters:  03/25/17 (!) 353 lb 11.2 oz (160.4 kg)  01/18/17 (!) 340 lb 3.2 oz (154.3 kg)  10/04/16 (!) 331 lb 8 oz (150.4 kg)   BMI Readings from Last 3 Encounters:  03/25/17 55.40 kg/m  01/18/17 53.28 kg/m  10/04/16 51.92 kg/m    Alcohol: Does not drink alcohol Tobacco use: Never user HIV, hep B, hep C: HIV Negative 2017; STI testing today STD testing and prevention (chl/gon/syphilis): Would like gonorrhea/chlamydia; declines syphilis testing today. Intimate partner violence: No concerns Sexual History/Pain during Intercourse: Last intercourse 1.5 years ago. Menstrual History/LMP/Abnormal Bleeding: Menses has been increasingly  irregular - went without for 640mo then 86m17moLMP was approx 03/15/2017. Incontinence Symptoms: Has some stress incontinence  Advanced Care Planning: A voluntary discussion about advance care planning including the explanation and discussion of advance directives.  Discussed health care proxy and Living will, and the patient was able to identify a health care proxy as Eldest Daughter - Dorquita Vanhook.  Patient does not have a living will at present time. If patient does have living will, I have requested they bring this to the clinic to be scanned in to their chart.  Breast cancer: Mother has had cysts that were drained/removed - testing was benign.  Due for mammogram today. No results found for: HMMAMMO  BRCA gene screening: Does not qualify Cervical cancer screening: We will do Pap today - had positive HPV 1 year ago.  Osteoporosis: No family history; no concerns. No results found for: HMDEXASCAN  Fall prevention/vitamin D: No history vitamin D deficiency. Lipids: Discussed with PCP Dr. SowAncil Boozerab Results  Component Value Date   CHOL 197 01/18/2017   CHOL 178 04/27/2016   CHOL 190 12/03/2014   Lab Results  Component Value Date   HDL 81 01/18/2017   HDL 68 04/27/2016   HDL 74 12/03/2014   Lab Results  Component Value Date   LDLCALC 102 (H) 01/18/2017   LDLCALC 99 04/27/2016   LDLCALC 105 (H) 12/03/2014   Lab Results  Component Value Date   TRIG 53 01/18/2017   TRIG  53 04/27/2016   TRIG 56 12/03/2014   Lab Results  Component Value Date   CHOLHDL 2.4 01/18/2017   CHOLHDL 2.6 04/27/2016   CHOLHDL 2.6 12/03/2014   No results found for: LDLDIRECT  Glucose:  Glucose, Bld  Date Value Ref Range Status  01/18/2017 96 65 - 99 mg/dL Final    Comment:    .            Fasting reference interval .   07/24/2016 115 (H) 65 - 99 mg/dL Final  06/30/2016 99 65 - 99 mg/dL Final   Glucose-Capillary  Date Value Ref Range Status  06/13/2016 106 (H) 65 - 99 mg/dL Final     Skin cancer: No concerning moles or lesions; does have skin tags. Colorectal cancer: She has no family history of colorectal cancer; no personal history of colorectal issues, denies changes to BM's, no diarrhea, constipation, dark and tarry stools, mucous or blood in stools.   Patient Active Problem List   Diagnosis Date Noted  . Stress incontinence in female 03/25/2017  . History of deep venous thrombosis (DVT) of distal vein of right lower extremity 01/18/2017  . Adrenal adenoma, left 09/11/2016  . Current moderate episode of major depressive disorder without prior episode ([place]) 08/10/2016  . 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 History of radioactive iodine thyroid ablation   . 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 Iron deficiency anemia 07/27/2015  . B12 deficiency 07/27/2015  . History of pulmonary embolus (PE) 07/25/2015  . Adrenal nodule ([place]) 07/25/2015  . Pulmonary nodules/lesions, multiple 07/25/2015  . OSA (obstructive sleep apnea) 05/18/2015  . Thyroid nodule 12/17/2014  . Heavy menstrual period 09/02/2014  . Gastroesophageal reflux disease without esophagitis 09/02/2014  . Morbid obesity, unspecified obesity type ([place]) 06/22/2014  . Large breasts 06/22/2014  . Anemia, iron deficiency 06/22/2014  . H/O hyperthyroidism 06/22/2014  . Right ventricular dilation 06/22/2014  . 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    Past Surgical History:  Procedure Laterality Date  . CESAREAN SECTION     26 years ago - repaired bleeding after vaginal birth    Family History  Problem Relation Age of Onset  . Hypertension Mother   . CVA Father   . Deep vein thrombosis Brother   . Pulmonary embolism Brother   . Anemia Sister     Social History   Socioeconomic History  . Marital status: Married    Spouse name: Not on file  . Number of children: 3  . Years of  education: Not on file  . Highest education level: High school graduate  Occupational History  . Not on file  Social Needs  . Financial resource strain: Not very hard  . Food insecurity:    Worry: Never true    Inability: Never true  . Transportation needs:    Medical: No    Non-medical: No  Tobacco Use  . Smoking status: Never Smoker  . Smokeless tobacco: Never Used  Substance and Sexual Activity  . Alcohol use: No    Alcohol/week: 0.0 oz    Comment: rarely  . Drug use: No  . Sexual activity: Not Currently    Partners: Male    Birth control/protection: Condom  Lifestyle  . Physical activity:    Days per week: 3 days    Minutes per session: 20 min  . Stress: To some extent  Relationships  . Social connections:    Talks  on phone: More than three times a week    Gets together: Twice a week    Attends religious service: 1 to 4 times per year    Active member of club or organization: No    Attends meetings of clubs or organizations: Never    Relationship status: Married  . Intimate partner violence:    Fear of current or ex partner: No    Emotionally abused: No    Physically abused: No    Forced sexual activity: No  Other Topics Concern  . Not on file  Social History Narrative   Lives with 3 children, husband was incarcerated April 2018 - for stealing, she has not been keeping in touch with him because she is disappointed. 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      Current Outpatient Medications:  .  amLODipine (NORVASC) 5 MG tablet, Take 1 tablet by mouth daily., Disp: , Rfl:  .  cyclobenzaprine (FLEXERIL) 10 MG tablet, Take 10 mg by mouth 3 (three) times daily as needed for muscle spasms. , Disp: , Rfl:  .  ferrous sulfate 325 (65 FE) MG tablet, Take 1 tablet (325 mg total) by mouth 2 (two) times daily with a meal., Disp: 180 tablet, Rfl: 1 .  lisinopril (PRINIVIL,ZESTRIL) 20 MG tablet, Take 1 tablet (20 mg total) by mouth  daily., Disp: 90 tablet, Rfl: 0 .  omeprazole (PRILOSEC) 40 MG capsule, Take 1 capsule (40 mg total) by mouth daily., Disp: 30 capsule, Rfl: 3 .  sertraline (ZOLOFT) 50 MG tablet, Take 1.5 tablets (75 mg total) by mouth daily., Disp: 135 tablet, Rfl: 0 .  vitamin B-12 1000 MCG tablet, Take 1 tablet (1,000 mcg total) by mouth daily., Disp: 30 tablet, Rfl: 0  Allergies  Allergen Reactions  . Aspirin Nausea And Vomiting  . Hctz [Hydrochlorothiazide] Other (See Comments)    Nausea, cramping     ROS  Constitutional: Negative for fever or weight change.  Respiratory: Negative for cough and shortness of breath.   Cardiovascular: Negative for chest pain or palpitations.  Gastrointestinal: Negative for abdominal pain, no bowel changes.  Musculoskeletal: Negative for gait problem or joint swelling.  Skin: Negative for rash.  Neurological: Negative for dizziness or headache.  No other specific complaints in a complete review of systems (except as listed in HPI above).  Objective  Vitals:   03/25/17 1105  BP: 134/78  Pulse: 73  Resp: 18  Temp: 98.2 F (36.8 C)  TempSrc: Oral  SpO2: 94%  Weight: (!) 353 lb 11.2 oz (160.4 kg)  Height: 5' 7"  (1.702 m)    Body mass index is 55.4 kg/m.  Physical Exam Constitutional: Patient appears well-developed and well-nourished. No distress.  HENT: Head: Normocephalic and atraumatic. Ears: B TMs ok, no erythema or effusion; Nose: Nose normal. Mouth/Throat: Oropharynx is clear and moist. No oropharyngeal exudate.  Eyes: Conjunctivae and EOM are normal. Pupils are equal, round, and reactive to light. No scleral icterus.  Neck: Normal range of motion. Neck supple. No JVD present. Thyroid slightly enlarged - right side slightly bigger than the left - pt notes this has been baseline for her. Cardiovascular: Normal rate, regular rhythm and normal heart sounds.  No murmur heard. No BLE edema. Pulmonary/Chest: Effort normal and breath sounds normal. No  respiratory distress. Abdominal: Soft. Bowel sounds are normal, no distension. There is no tenderness. no masses Breast: Large breasts; no lumps or masses, no nipple discharge  or rashes FEMALE GENITALIA:  External genitalia normal External urethra normal Vaginal vault normal without discharge or lesions Cervix normal without discharge or lesions Bimanual exam normal without masses RECTAL: no rectal masses or hemorrhoids Musculoskeletal: Normal range of motion, no joint effusions. No gross deformities Neurological: she is alert and oriented to person, place, and time. No cranial nerve deficit. Coordination, balance, strength, speech and gait are normal.  Skin: Skin is warm and dry. No rash noted. No erythema.  Psychiatric: Patient has a normal mood and affect. behavior is normal. Judgment and thought content normal.  Recent Results (from the past 2160 hour(s))  Hemoglobin A1c     Status: None   Collection Time: 01/18/17 11:53 AM  Result Value Ref Range   Hgb A1c MFr Bld 5.0 <5.7 % of total Hgb    Comment: For the purpose of screening for the presence of diabetes: . <5.7%       Consistent with the absence of diabetes 5.7-6.4%    Consistent with increased risk for diabetes             (prediabetes) > or =6.5%  Consistent with diabetes . This assay result is consistent with a decreased risk of diabetes. . Currently, no consensus exists regarding use of hemoglobin A1c for diagnosis of diabetes in children. . According to American Diabetes Association (ADA) guidelines, hemoglobin A1c <7.0% represents optimal control in non-pregnant diabetic patients. Different metrics may apply to specific patient populations.  Standards of Medical Care in Diabetes(ADA). .    Mean Plasma Glucose 97 (calc)   eAG (mmol/L) 5.4 (calc)  Lipid panel     Status: Abnormal   Collection Time: 01/18/17 11:53 AM  Result Value Ref Range   Cholesterol 197 <200 mg/dL   HDL 81 >50 mg/dL   Triglycerides 53  <150 mg/dL   LDL Cholesterol (Calc) 102 (H) mg/dL (calc)    Comment: Reference range: <100 . Desirable range <100 mg/dL for primary prevention;   <70 mg/dL for patients with CHD or diabetic patients  with > or = 2 CHD risk factors. Marland Kitchen LDL-C is now calculated using the Martin-Hopkins  calculation, which is a validated novel method providing  better accuracy than the Friedewald equation in the  estimation of LDL-C.  Cresenciano Genre et al. Annamaria Helling. 4163;845(36): 2061-2068  (http://education.[facility].com/faq/FAQ164)    Total CHOL/HDL Ratio 2.4 <5.0 (calc)   Non-HDL Cholesterol (Calc) 116 <130 mg/dL (calc)    Comment: For patients with diabetes plus 1 major ASCVD risk  factor, treating to a non-HDL-C goal of <100 mg/dL  (LDL-C of <70 mg/dL) is considered a therapeutic  option.   COMPLETE METABOLIC PANEL WITH GFR     Status: None   Collection Time: 01/18/17 11:53 AM  Result Value Ref Range   Glucose, Bld 96 65 - 99 mg/dL    Comment: .            Fasting reference interval .    BUN 10 7 - 25 mg/dL   Creat 0.68 0.50 - 1.10 mg/dL   GFR, Est Non African American 103 > OR = 60 mL/min/1.42m   GFR, Est African American 119 > OR = 60 mL/min/1.737m  BUN/Creatinine Ratio NOT APPLICABLE 6 - 22 (calc)   Sodium 141 135 - 146 mmol/L   Potassium 4.1 3.5 - 5.3 mmol/L   Chloride 105 98 - 110 mmol/L   CO2 29 20 - 32 mmol/L   Calcium 9.1 8.6 - 10.2 mg/dL   Total Protein 6.9  6.1 - 8.1 g/dL   Albumin 3.7 3.6 - 5.1 g/dL   Globulin 3.2 1.9 - 3.7 g/dL (calc)   AG Ratio 1.2 1.0 - 2.5 (calc)   Total Bilirubin 0.5 0.2 - 1.2 mg/dL   Alkaline phosphatase (APISO) 90 33 - 115 U/L   AST 10 10 - 35 U/L   ALT 7 6 - 29 U/L  Vitamin B12     Status: None   Collection Time: 01/18/17 11:53 AM  Result Value Ref Range   Vitamin B-12 430 200 - 1,100 pg/mL  TSH     Status: None   Collection Time: 01/18/17 11:53 AM  Result Value Ref Range   TSH 4.22 mIU/L    Comment:           Reference Range .           >  or = 20 Years  0.40-4.50 .                Pregnancy Ranges           First trimester    0.26-2.66           Second trimester   0.55-2.73           Third trimester    0.43-2.91   CBC with Differential/Platelet     Status: Abnormal   Collection Time: 01/18/17 11:53 AM  Result Value Ref Range   WBC 4.9 3.8 - 10.8 Thousand/uL   RBC 3.88 3.80 - 5.10 Million/uL   Hemoglobin 11.2 (L) 11.7 - 15.5 g/dL   HCT 34.2 (L) 35.0 - 45.0 %   MCV 88.1 80.0 - 100.0 fL   MCH 28.9 27.0 - 33.0 pg   MCHC 32.7 32.0 - 36.0 g/dL   RDW 12.9 11.0 - 15.0 %   Platelets 255 140 - 400 Thousand/uL   MPV 10.8 7.5 - 12.5 fL   Neutro Abs 2,156 1,500 - 7,800 cells/uL   Lymphs Abs 2,239 850 - 3,900 cells/uL   WBC mixed population 348 200 - 950 cells/uL   Eosinophils Absolute 137 15 - 500 cells/uL   Basophils Absolute 20 0 - 200 cells/uL   Neutrophils Relative % 44 %   Total Lymphocyte 45.7 %   Monocytes Relative 7.1 %   Eosinophils Relative 2.8 %   Basophils Relative 0.4 %    PHQ2/9: Depression screen [facility] 2/9 01/18/2017 10/04/2016 06/22/2016 12/20/2015 09/02/2015  Decreased Interest 0 0 0 0 0  Down, Depressed, Hopeless 0 0 0 0 0  PHQ - 2 Score 0 0 0 0 0   Fall Risk: Fall Risk  01/18/2017 10/04/2016 06/22/2016 05/03/2016 12/20/2015  Falls in the past year? No No No No No    Assessment & Plan  1. Well woman exam with routine gynecological exam -USPSTF grade A and B recommendations reviewed with patient; age-appropriate recommendations, preventive care, screening tests, etc discussed and encouraged; healthy living encouraged; see AVS for patient education given to patient -Discussed importance of 150 minutes of physical activity weekly, eat two servings of fish weekly, eat one serving of tree nuts ( cashews, pistachios, pecans, almonds.Marland Kitchen) every other day, eat 6 servings of fruit/vegetables daily and drink plenty of water and avoid sweet beverages.  - Pap IG, CT/NG NAA, and HPV (high risk)  2. Morbid obesity,  unspecified obesity type ([place]) - Lifestyle modifications discussed at length; also discussed increased risk for adverse health outcomes due to her ongoing morbid obesity including MI, CVA, HTN, DM, etc.  3. Stress incontinence in female - Pt reports mild to moderate stress incontinence, she declines referral or medication today, discussed Kegel exercises.  4. Breast cancer screening - MM DIGITAL SCREENING BILATERAL; Future  5. Screening for colorectal cancer - Cologuard - discussed options at length including colonoscopy vs cologuard. She strongly prefers cologuard, no family or personal history of colorectal cancer.  6. Cervical cancer screening - Pap IG, CT/NG NAA, and HPV (high risk)  7. HPV (human papilloma virus) infection - Pap IG, CT/NG NAA, and HPV (high risk); Advised if HPV positive but Pap normal we will repeat in 1 year, if HPV positive + Pap abnormal we will send to GYN for possible colposcopy.  8. Screen for STD (sexually transmitted disease) - Pap IG, CT/NG NAA, and HPV (high risk) - She declines syphilis screening today.

## 2017-03-27 LAB — PAP IG, CT-NG NAA, HPV HIGH-RISK
C. TRACHOMATIS RNA, TMA: NOT DETECTED
HPV DNA High Risk: NOT DETECTED
N. GONORRHOEAE RNA, TMA: NOT DETECTED

## 2017-06-12 ENCOUNTER — Encounter: Payer: 59 | Admitting: Family Medicine

## 2017-06-20 ENCOUNTER — Ambulatory Visit (HOSPITAL_COMMUNITY): Payer: 59 | Admitting: Psychiatry

## 2017-06-24 NOTE — Progress Notes (Signed)
[facility] MD/PA/NP OP Progress Note  06/26/2017 10:27 AM Madeline Dominguez  MRN:  [id number]  Chief Complaint:  Chief Complaint    Depression; Follow-up     HPI:  Patient presents for follow up for depression. She states that she has been doing pretty good since the last appointment. She is planning to make a trip to beach in August for her daughter's birthday. She has been busy working. She occasionally communicates with her husband in jail, who will be released in 2020. She believes that her anxiety is becoming less as she feels that her husband is trying to change and he has started to see a therapist. She may visit him every month or so and would decide what to do with the relationship. She has not had any spells of dizziness for a while. She notices less mood changes around menstrual cycle. She sleeps well. She feels fatigue at times. She has good concentration. She denies SI. She denies panic attacks. She denies irritability. She has good appetite. She has not done exercise as she wished; she agrees to take a walk regularly.  Wt Readings from Last 3 Encounters:  06/26/17 (!) 349 lb (158.3 kg)  03/25/17 (!) 353 lb 11.2 oz (160.4 kg)  03/19/17 (!) 350 lb (158.8 kg)    Visit Diagnosis:    ICD-10-CM   1. Current moderate episode of major depressive disorder without prior episode ([place]) F32.1     Past Psychiatric History: Please see initial evaluation for full details. I have reviewed the history. No updates at this time.     Past Medical History:  Past Medical History:  Diagnosis Date  . Adrenal adenoma    a. normal cortisol and aldosterone levels  . Cough 07/25/2015  . Dilated cardiomyopathy ([place])    a. TTE 2/15: EF of 55-60%, dilated LV & LA, and nl RV; b. TTE 6/16:EF 55-60%, no RWMA, nl LV dias fxn, LV cavity size nl with mild concentric LVH, mildly dilated ascending aorta, LA nl size, RV sys fxn nl, PASP nl; c. TTE 7/17: EF 45-50%, unable to exclude RWMA, nl LV dias faxn, mildly  dilated RV w/ nl sys fxn, PASP nl; d. TTE 7/18: EF 45-50%, mild LVH, Gr1DD, mildly dilated LA, mildly dilated RV  . Goiter    a. s/p radioactive iodine treatment in 2010  . Heart palpitations   . Hypertension   . Iron deficiency   . Large breasts   . Morbid obesity ([place])   . Postpartum hemorrhage   . Pulmonary embolism ([place]) 07/2015   a. s/p Eliquis; b. hypercoag work up neg; c. felt to be 2/2 OCP    Past Surgical History:  Procedure Laterality Date  . CESAREAN SECTION     26 years ago - repaired bleeding after vaginal birth    Family Psychiatric History: Please see initial evaluation for full details. I have reviewed the history. No updates at this time.     Family History:  Family History  Problem Relation Age of Onset  . Hypertension Mother   . CVA Father   . Deep vein thrombosis Brother   . Pulmonary embolism Brother   . Anemia Sister     Social History:  Social History   Socioeconomic History  . Marital status: Married    Spouse name: Not on file  . Number of children: 3  . Years of education: Not on file  . Highest education level: High school graduate  Occupational History  . Not on  file  Social Needs  . Financial resource strain: Not very hard  . Food insecurity:    Worry: Never true    Inability: Never true  . Transportation needs:    Medical: No    Non-medical: No  Tobacco Use  . Smoking status: Never Smoker  . Smokeless tobacco: Never Used  Substance and Sexual Activity  . Alcohol use: No    Alcohol/week: 0.0 oz    Comment: rarely  . Drug use: No  . Sexual activity: Not Currently    Partners: Male    Birth control/protection: Condom  Lifestyle  . Physical activity:    Days per week: 3 days    Minutes per session: 20 min  . Stress: To some extent  Relationships  . Social connections:    Talks on phone: More than three times a week    Gets together: Twice a week    Attends religious service: 1 to 4 times per year    Active member of  club or organization: No    Attends meetings of clubs or organizations: Never    Relationship status: Married  Other Topics Concern  . Not on file  Social History Narrative   Lives with 3 children, husband was incarcerated April 2018 - for stealing, she has not been keeping in touch with him because she is disappointed. 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     Allergies:  Allergies  Allergen Reactions  . Aspirin Nausea And Vomiting  . Hctz [Hydrochlorothiazide] Other (See Comments)    Nausea, cramping    Metabolic Disorder Labs: Lab Results  Component Value Date   HGBA1C 5.0 01/18/2017   MPG 97 01/18/2017   MPG 94 04/27/2016   No results found for: PROLACTIN Lab Results  Component Value Date   CHOL 197 01/18/2017   TRIG 53 01/18/2017   HDL 81 01/18/2017   CHOLHDL 2.4 01/18/2017   VLDL 11 04/27/2016   LDLCALC 102 (H) 01/18/2017   LDLCALC 99 04/27/2016   Lab Results  Component Value Date   TSH 4.22 01/18/2017   TSH 2.21 08/10/2016    Therapeutic Level Labs: No results found for: LITHIUM No results found for: VALPROATE No components found for:  CBMZ  Current Medications: Current Outpatient Medications  Medication Sig Dispense Refill  . amLODipine (NORVASC) 5 MG tablet Take 1 tablet by mouth daily.    . cyclobenzaprine (FLEXERIL) 10 MG tablet Take 10 mg by mouth 3 (three) times daily as needed for muscle spasms.     . ferrous sulfate 325 (65 FE) MG tablet Take 1 tablet (325 mg total) by mouth 2 (two) times daily with a meal. 180 tablet 1  . lisinopril (PRINIVIL,ZESTRIL) 20 MG tablet Take 1 tablet (20 mg total) by mouth daily. 90 tablet 0  . omeprazole (PRILOSEC) 40 MG capsule Take 1 capsule (40 mg total) by mouth daily. 30 capsule 3  . sertraline (ZOLOFT) 50 MG tablet Take 1.5 tablets (75 mg total) by mouth daily. 135 tablet 0  . vitamin B-12 1000 MCG tablet Take 1 tablet (1,000 mcg total) by mouth daily. 30 tablet 0    No current facility-administered medications for this visit.      Musculoskeletal: Strength & Muscle Tone: within normal limits Gait & Station: normal Patient leans: N/A  Psychiatric Specialty Exam: Review of Systems  Psychiatric/Behavioral: Negative for depression, hallucinations, memory loss, substance abuse and suicidal ideas. The patient is nervous/anxious. The  patient does not have insomnia.   All other systems reviewed and are negative.   Blood pressure (!) 157/79, pulse 77, height 5\' 7"  (1.702 m), weight (!) 349 lb (158.3 kg), SpO2 96 %.Body mass index is 54.66 kg/m.  General Appearance: Fairly Groomed  Eye Contact:  Good  Speech:  Clear and Coherent  Volume:  Normal  Mood:  "better"  Affect:  Appropriate, Congruent and euthymic, smiles  Thought Process:  Coherent  Orientation:  Full (Time, Place, and Person)  Thought Content: Logical   Suicidal Thoughts:  No  Homicidal Thoughts:  No  Memory:  Immediate;   Good  Judgement:  Good  Insight:  Fair  Psychomotor Activity:  Normal  Concentration:  Concentration: Good and Attention Span: Good  Recall:  Good  Fund of Knowledge: Good  Language: Good  Akathisia:  No  Handed:  Right  AIMS (if indicated): not done  Assets:  Communication Skills Desire for Improvement  ADL's:  Intact  Cognition: WNL  Sleep:  Good   Screenings: PHQ2-9     Office Visit from 01/18/2017 in [facility] Office Visit from 10/04/2016 in [facility] Office Visit from 06/22/2016 in [facility] Office Visit from 12/20/2015 in [facility] Office Visit from 09/02/2015 in [facility]  PHQ-2 Total Score  0  0  0  0  0       Assessment and Plan:  LATANZA PFEFFERKORN is a 52 y.o. 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  Will continue sertraline to target depression.  Discussed behavioral activation.   Plan 1. Continue sertraline 75 mg daily  2. Return to clinic in thee months for 15 mins  The patient demonstrates the following risk factors for suicide: Chronic risk factors for suicide include: psychiatric disorder of depression. 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 Protective factorsfor this patient include: responsibility to others (children, family) and coping skills. Considering these factors, the overall suicide risk at this point appears to be low. Patient isappropriate for outpatient follow up.  [name], MD 06/26/2017, 10:27 AM

## 2017-06-26 ENCOUNTER — Ambulatory Visit (INDEPENDENT_AMBULATORY_CARE_PROVIDER_SITE_OTHER): Payer: 59 | Admitting: Psychiatry

## 2017-06-26 ENCOUNTER — Encounter (HOSPITAL_COMMUNITY): Payer: Self-pay | Admitting: Psychiatry

## 2017-06-26 VITALS — BP 157/79 | HR 77 | Ht 67.0 in | Wt 349.0 lb

## 2017-06-26 DIAGNOSIS — Z79899 Other long term (current) drug therapy: Secondary | ICD-10-CM

## 2017-06-26 DIAGNOSIS — R450 Nervousness: Secondary | ICD-10-CM | POA: Diagnosis not present

## 2017-06-26 DIAGNOSIS — Z6379 Other stressful life events affecting family and household: Secondary | ICD-10-CM

## 2017-06-26 DIAGNOSIS — F321 Major depressive disorder, single episode, moderate: Secondary | ICD-10-CM

## 2017-06-26 MED ORDER — SERTRALINE HCL 50 MG PO TABS: 75.0000 mg | ORAL_TABLET | Freq: Every day | ORAL | 0 refills | Status: DC

## 2017-06-26 NOTE — Patient Instructions (Signed)
1. Continue sertraline 75 mg daily  2. Return to clinic in thee months for 15 mins

## 2017-07-02 ENCOUNTER — Telehealth: Payer: Self-pay | Admitting: Family Medicine

## 2017-07-02 ENCOUNTER — Other Ambulatory Visit: Payer: Self-pay | Admitting: Family Medicine

## 2017-07-02 DIAGNOSIS — I10 Essential (primary) hypertension: Secondary | ICD-10-CM

## 2017-07-02 NOTE — Telephone Encounter (Signed)
Unable to leave VM; Rx is at pharmacy.

## 2017-07-02 NOTE — Telephone Encounter (Signed)
Refill request for Hypertension medication:  Lisinopril 20 mg  Last office visit pertaining to hypertension: 03/25/2017  BP Readings from Last 3 Encounters:  03/25/17 134/78  01/18/17 138/84  10/04/16 136/84     Lab Results  Component Value Date   CREATININE 0.68 01/18/2017   BUN 10 01/18/2017   NA 141 01/18/2017   K 4.1 01/18/2017   CL 105 01/18/2017   CO2 29 01/18/2017    Follow-ups on file. 07/19/2017

## 2017-07-02 NOTE — Telephone Encounter (Signed)
Copied from [place] [id number]. Topic: Quick Communication - Rx Refill/Question >> Jul 02, 2017 12:07 PM Burchel, Abbi R wrote: Medication: lisinopril (PRINIVIL,ZESTRIL) 20 MG tablet  Has the patient contacted their pharmacy? Yes.    Preferred Pharmacy: [facility] [address]), [place] [address] Phone: [phone] Fax: [phone] Not a 24 hour pharmacy; exact hours not known.  Pt:  [phone] or [phone]

## 2017-07-19 ENCOUNTER — Ambulatory Visit: Payer: 59 | Admitting: Family Medicine

## 2017-07-22 ENCOUNTER — Encounter: Payer: Self-pay | Admitting: Family Medicine

## 2017-07-22 ENCOUNTER — Ambulatory Visit: Payer: 59 | Admitting: Family Medicine

## 2017-07-22 VITALS — BP 138/86 | HR 85 | Temp 98.3°F | Resp 18 | Ht 67.0 in | Wt 345.7 lb

## 2017-07-22 DIAGNOSIS — Z923 Personal history of irradiation: Secondary | ICD-10-CM

## 2017-07-22 DIAGNOSIS — E538 Deficiency of other specified B group vitamins: Secondary | ICD-10-CM | POA: Diagnosis not present

## 2017-07-22 DIAGNOSIS — E0520 Thyrotoxicosis with toxic multinodular goiter without thyrotoxic crisis or storm: Secondary | ICD-10-CM

## 2017-07-22 DIAGNOSIS — E278 Other specified disorders of adrenal gland: Secondary | ICD-10-CM

## 2017-07-22 DIAGNOSIS — I10 Essential (primary) hypertension: Secondary | ICD-10-CM | POA: Diagnosis not present

## 2017-07-22 DIAGNOSIS — I422 Other hypertrophic cardiomyopathy: Secondary | ICD-10-CM

## 2017-07-22 DIAGNOSIS — N393 Stress incontinence (female) (male): Secondary | ICD-10-CM

## 2017-07-22 DIAGNOSIS — E0590 Thyrotoxicosis, unspecified without thyrotoxic crisis or storm: Secondary | ICD-10-CM

## 2017-07-22 DIAGNOSIS — K047 Periapical abscess without sinus: Secondary | ICD-10-CM

## 2017-07-22 DIAGNOSIS — D500 Iron deficiency anemia secondary to blood loss (chronic): Secondary | ICD-10-CM

## 2017-07-22 DIAGNOSIS — E279 Disorder of adrenal gland, unspecified: Secondary | ICD-10-CM

## 2017-07-22 MED ORDER — AMOXICILLIN-POT CLAVULANATE 875-125 MG PO TABS
1.0000 | ORAL_TABLET | Freq: Two times a day (BID) | ORAL | 0 refills | Status: DC
Start: 2017-07-22 — End: 2018-01-24

## 2017-07-22 MED ORDER — LISINOPRIL 20 MG PO TABS: 20.0000 mg | ORAL_TABLET | Freq: Every day | ORAL | 0 refills | Status: DC

## 2017-07-22 NOTE — Progress Notes (Signed)
Name: Madeline Dominguez   MRN: [id number]    DOB: [birth year]   Date:07/22/2017       Progress Note  Subjective  Chief Complaint  Chief Complaint  Patient presents with  . Medication Refill    6 month F/U  . Hypertension    Edema in bilateral feet and ankles when on her feet for prolong periods of time.  . Depression  . Facial Swelling    Since Saturday, right jaw line is swollen and tender.   . Gastroesophageal Reflux    Takes medication as needed    HPI  Cardiomyopathy: denies orthopnea or decrease in exercise tolerance, on lisinopril. No chest pain or palpitation. Secondary to hyperthyroidism and uncontrolled HTN  HTN: doing much better , taking medication as prescribed, mild swelling on legs but not bothersome. No chest pain or palpitation, bp is at goal today, seeing nephrologist   Depression: she is feeling much better having normal energy level, smiling much more, seeing Dr. Valentina Gu and taking Zoloft phq 9 is 5 today. Denies suicidal thoughts or ideation  Right lymphadenopathy: she has a history of wisdom tooth pain/problems, on the right lower side. States that over the past few days she has noticed pain again, and now has a tender lump under her jaw, hard to open mouth, no fever or chills.   Adrenal nodule left: no symptoms, many tests done, seeing endocrinologist at [facility], and still being evaluated. Going back soon  Goiter: no dysphagia, recently had [place], seeing endo, not on medication, had an ablation   Obesity: discussed life style modification again  GERD: taking medication prn only and is doing well. Unchanged   Anemia: iron studies was not done last time, advised to have colonoscopy instead of cologuard, but she states she prefers cologuard and if positive get colonoscopy. . She does not want to have labs done today    Patient Active Problem List   Diagnosis Date Noted  . Stress incontinence in female 03/25/2017  . History of deep venous thrombosis (DVT) of  distal vein of right lower extremity 01/18/2017  . Adrenal adenoma, left 09/11/2016  . Current moderate episode of major depressive disorder without prior episode ([place]) 08/10/2016  . 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 History of radioactive iodine thyroid ablation   . 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 Iron deficiency anemia 07/27/2015  . B12 deficiency 07/27/2015  . History of pulmonary embolus (PE) 07/25/2015  . Adrenal nodule ([place]) 07/25/2015  . Pulmonary nodules/lesions, multiple 07/25/2015  . OSA (obstructive sleep apnea) 05/18/2015  . Thyroid nodule 12/17/2014  . Heavy menstrual period 09/02/2014  . Gastroesophageal reflux disease without esophagitis 09/02/2014  . Morbid obesity, unspecified obesity type ([place]) 06/22/2014  . Large breasts 06/22/2014  . Anemia, iron deficiency 06/22/2014  . H/O hyperthyroidism 06/22/2014  . Right ventricular dilation 06/22/2014  . 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    Past Surgical History:  Procedure Laterality Date  . CESAREAN SECTION     26 years ago - repaired bleeding after vaginal birth    Family History  Problem Relation Age of Onset  . Hypertension Mother   . CVA Father   . Deep vein thrombosis Brother   . Pulmonary embolism Brother   . Anemia Sister     Social History   Socioeconomic History  . Marital status: Married    Spouse name: Not on file  . Number of children: 3  .  Years of education: Not on file  . Highest education level: High school graduate  Occupational History  . Not on file  Social Needs  . Financial resource strain: Not very hard  . Food insecurity:    Worry: Never true    Inability: Never true  . Transportation needs:    Medical: No    Non-medical: No  Tobacco Use  . Smoking status: Never Smoker  . Smokeless tobacco: Never Used  Substance and Sexual Activity  . Alcohol use: No     Alcohol/week: 0.0 oz    Comment: rarely  . Drug use: No  . Sexual activity: Not Currently    Partners: Male    Birth control/protection: Condom  Lifestyle  . Physical activity:    Days per week: 3 days    Minutes per session: 20 min  . Stress: To some extent  Relationships  . Social connections:    Talks on phone: More than three times a week    Gets together: Twice a week    Attends religious service: 1 to 4 times per year    Active member of club or organization: No    Attends meetings of clubs or organizations: Never    Relationship status: Married  . Intimate partner violence:    Fear of current or ex partner: No    Emotionally abused: No    Physically abused: No    Forced sexual activity: No  Other Topics Concern  . Not on file  Social History Narrative   Lives with 3 children, husband was incarcerated April 2018 - for stealing, she has not been keeping in touch with him because she is disappointed. 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      Current Outpatient Medications:  .  amLODipine (NORVASC) 5 MG tablet, Take 1 tablet by mouth daily., Disp: , Rfl:  .  cyclobenzaprine (FLEXERIL) 10 MG tablet, Take 10 mg by mouth 3 (three) times daily as needed for muscle spasms. , Disp: , Rfl:  .  ferrous sulfate 325 (65 FE) MG tablet, Take 1 tablet (325 mg total) by mouth 2 (two) times daily with a meal., Disp: 180 tablet, Rfl: 1 .  lisinopril (PRINIVIL,ZESTRIL) 20 MG tablet, TAKE 1 TABLET BY MOUTH ONCE DAILY, Disp: 90 tablet, Rfl: 0 .  omeprazole (PRILOSEC) 40 MG capsule, Take 1 capsule (40 mg total) by mouth daily. (Patient taking differently: Take 40 mg by mouth daily as needed. ), Disp: 30 capsule, Rfl: 3 .  sertraline (ZOLOFT) 50 MG tablet, Take 1.5 tablets (75 mg total) by mouth daily., Disp: 135 tablet, Rfl: 0 .  vitamin B-12 1000 MCG tablet, Take 1 tablet (1,000 mcg total) by mouth daily., Disp: 30 tablet, Rfl: 0  Allergies   Allergen Reactions  . Aspirin Nausea And Vomiting  . Hctz [Hydrochlorothiazide] Other (See Comments)    Nausea, cramping     ROS  Constitutional: Negative for fever or weight change.  Respiratory: Negative for cough and shortness of breath.   Cardiovascular: Negative for chest pain or palpitations.  Gastrointestinal: Negative for abdominal pain, no bowel changes.  Musculoskeletal: Negative for gait problem or joint swelling.  Skin: Negative for rash.  Neurological: Negative for dizziness or headache.  No other specific complaints in a complete review of systems (except as listed in HPI above).  Objective  Vitals:   07/22/17 0936  BP: 138/86  Pulse: 85  Resp: 18  Temp: 98.3  F (36.8 C)  TempSrc: Oral  SpO2: 96%  Weight: (!) 345 lb 11.2 oz (156.8 kg)  Height: 5\' 7"  (1.702 m)    Body mass index is 54.14 kg/m.  Physical Exam  Constitutional: Patient appears well-developed and well-nourished. Obese  No distress.  HEENT: head atraumatic, normocephalic, pupils equal and reactive to light, neck supple, throat within normal limits, difficulty opening mouth, tender and swollen gum around wisdom tooth right lower side, tender submandibular lymphadenopathy Cardiovascular: Normal rate, regular rhythm and normal heart sounds.  No murmur heard. No BLE edema. Pulmonary/Chest: Effort normal and breath sounds normal. No respiratory distress. Abdominal: Soft.  There is no tenderness. Psychiatric: Patient has a normal mood and affect. behavior is normal. Judgment and thought content normal.   PHQ2/9: Depression screen [facility] 2/9 07/22/2017 01/18/2017 10/04/2016 06/22/2016 12/20/2015  Decreased Interest 1 0 0 0 0  Down, Depressed, Hopeless 1 0 0 0 0  PHQ - 2 Score 2 0 0 0 0  Altered sleeping 0 - - - -  Tired, decreased energy 1 - - - -  Change in appetite 1 - - - -  Feeling bad or failure about yourself  1 - - - -  Trouble concentrating 0 - - - -  Moving slowly or fidgety/restless 0 - -  - -  Suicidal thoughts 0 - - - -  PHQ-9 Score 5 - - - -  Difficult doing work/chores Not difficult at all - - - -     Fall Risk: Fall Risk  07/22/2017 01/18/2017 10/04/2016 06/22/2016 05/03/2016  Falls in the past year? No No No No No     Functional Status Survey: Is the patient deaf or have difficulty hearing?: Yes(has been a little bit off) Does the patient have difficulty seeing, even when wearing glasses/contacts?: No Does the patient have difficulty concentrating, remembering, or making decisions?: No Does the patient have difficulty walking or climbing stairs?: No Does the patient have difficulty dressing or bathing?: No Does the patient have difficulty doing errands alone such as visiting a doctor's office or shopping?: No    Assessment & Plan  1. Hypertension, benign  At goal today  - lisinopril (PRINIVIL,ZESTRIL) 20 MG tablet; Take 1 tablet (20 mg total) by mouth daily.  Dispense: 90 tablet; Refill: 0  2. Goiter, toxic, multinodular  Off medication   3. History of radioactive iodine thyroid ablation   4. B12 deficiency  Taking b12 otc   5. Iron deficiency anemia due to chronic blood loss  Needs to have colonoscopy but refuses and does not want to repeat labs today   6. Morbid obesity, unspecified obesity type [facility])  Discussed with the patient the risk posed by an increased BMI. Discussed importance of portion control, calorie counting and at least 150 minutes of physical activity weekly. Avoid sweet beverages and drink more water. Eat at least 6 servings of fruit and vegetables daily   7. Stress incontinence in female  stable  8. Adrenal nodule ([place])  Keep follow up with Endo   9. 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 Dental abscess  - amoxicillin-clavulanate (AUGMENTIN) 875-125 MG tablet; Take 1 tablet by mouth 2 (two) times daily.  Dispense: 20 tablet; Refill: 0

## 2017-09-14 ENCOUNTER — Emergency Department
Admission: EM | Admit: 2017-09-14 | Discharge: 2017-09-14 | Disposition: A | Discharge status: Home / Self Care | Payer: 59 | Attending: Emergency Medicine | Admitting: Emergency Medicine

## 2017-09-14 ENCOUNTER — Other Ambulatory Visit: Payer: Self-pay

## 2017-09-14 ENCOUNTER — Encounter: Payer: Self-pay | Admitting: Emergency Medicine

## 2017-09-14 ENCOUNTER — Emergency Department: Payer: 59

## 2017-09-14 DIAGNOSIS — I10 Essential (primary) hypertension: Secondary | ICD-10-CM | POA: Insufficient documentation

## 2017-09-14 DIAGNOSIS — M1712 Unilateral primary osteoarthritis, left knee: Secondary | ICD-10-CM | POA: Insufficient documentation

## 2017-09-14 DIAGNOSIS — Z79899 Other long term (current) drug therapy: Secondary | ICD-10-CM | POA: Diagnosis not present

## 2017-09-14 DIAGNOSIS — M25562 Pain in left knee: Secondary | ICD-10-CM | POA: Diagnosis not present

## 2017-09-14 DIAGNOSIS — I420 Dilated cardiomyopathy: Secondary | ICD-10-CM | POA: Diagnosis not present

## 2017-09-14 DIAGNOSIS — Z86718 Personal history of other venous thrombosis and embolism: Secondary | ICD-10-CM | POA: Insufficient documentation

## 2017-09-14 MED ORDER — DICLOFENAC SODIUM 50 MG PO TBEC: 50.0000 mg | DELAYED_RELEASE_TABLET | Freq: Two times a day (BID) | ORAL | 0 refills | Status: AC

## 2017-09-14 NOTE — ED Provider Notes (Signed)
[facility] Emergency Department Provider Note ____________________________________________  Time seen: 1507  I have reviewed the triage vital signs and the nursing notes.  HISTORY  Chief Complaint  Knee Pain  HPI Madeline Dominguez is a 52 y.o. female who presents to the ED for evaluation of left knee pain.  Patient gives a history of a one-week complaint of intermittent left knee pain localized to the medial aspect of the joint.  She denies any injury, accident, or trauma.  She also denies any pre-existing history of left knee problems.  She reports onset about a week ago, as she walked into a local store.  She denies again in the injury by slip, trip, or fall.  She described a sudden sharp pain to the medial knee, and then ongoing pain with weightbearing.  She is also noted intermittent swelling.  She is taken Tylenol and ibuprofen in the interim significant benefit.  She presents today for further evaluation.  Past Medical History:  Diagnosis Date  . Adrenal adenoma    a. normal cortisol and aldosterone levels  . Cough 07/25/2015  . Dilated cardiomyopathy ([place])    a. TTE 2/15: EF of 55-60%, dilated LV & LA, and nl RV; b. TTE 6/16:EF 55-60%, no RWMA, nl LV dias fxn, LV cavity size nl with mild concentric LVH, mildly dilated ascending aorta, LA nl size, RV sys fxn nl, PASP nl; c. TTE 7/17: EF 45-50%, unable to exclude RWMA, nl LV dias faxn, mildly dilated RV w/ nl sys fxn, PASP nl; d. TTE 7/18: EF 45-50%, mild LVH, Gr1DD, mildly dilated LA, mildly dilated RV  . Goiter    a. s/p radioactive iodine treatment in 2010  . Heart palpitations   . Hypertension   . Iron deficiency   . Large breasts   . Morbid obesity ([place])   . Postpartum hemorrhage   . Pulmonary embolism ([place]) 07/2015   a. s/p Eliquis; b. hypercoag work up neg; c. felt to be 2/2 OCP    Patient Active Problem List   Diagnosis Date Noted  . Stress incontinence in female 03/25/2017  . History of deep  venous thrombosis (DVT) of distal vein of right lower extremity 01/18/2017  . Adrenal adenoma, left 09/11/2016  . Current moderate episode of major depressive disorder without prior episode ([place]) 08/10/2016  . 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 History of radioactive iodine thyroid ablation   . 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 Iron deficiency anemia 07/27/2015  . B12 deficiency 07/27/2015  . History of pulmonary embolus (PE) 07/25/2015  . Adrenal nodule ([place]) 07/25/2015  . Pulmonary nodules/lesions, multiple 07/25/2015  . OSA (obstructive sleep apnea) 05/18/2015  . Thyroid nodule 12/17/2014  . Heavy menstrual period 09/02/2014  . Gastroesophageal reflux disease without esophagitis 09/02/2014  . Morbid obesity, unspecified obesity type ([place]) 06/22/2014  . Large breasts 06/22/2014  . Anemia, iron deficiency 06/22/2014  . H/O hyperthyroidism 06/22/2014  . Right ventricular dilation 06/22/2014  . 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    Past Surgical History:  Procedure Laterality Date  . CESAREAN SECTION     26 years ago - repaired bleeding after vaginal birth    Prior to Admission medications   Medication Sig Start Date End Date Taking? Authorizing Provider  amLODipine (NORVASC) 5 MG tablet Take 1 tablet by mouth daily. 08/01/16   Holley Raring Munsoor, MD  amoxicillin-clavulanate (AUGMENTIN) 875-125 MG tablet Take 1 tablet by mouth  2 (two) times daily. 07/22/17   Steele Sizer, MD  cyclobenzaprine (FLEXERIL) 10 MG tablet Take 10 mg by mouth 3 (three) times daily as needed for muscle spasms.  06/19/16   [provider]  diclofenac (VOLTAREN) 50 MG EC tablet Take 1 tablet (50 mg total) by mouth 2 (two) times daily. 09/14/17 10/14/17  Kyrin Garn, Dannielle Karvonen, PA-C  ferrous sulfate 325 (65 FE) MG tablet Take 1 tablet (325 mg total) by mouth 2 (two) times daily with a meal.  09/02/14   Ancil Boozer, Drue Stager, MD  lisinopril (PRINIVIL,ZESTRIL) 20 MG tablet Take 1 tablet (20 mg total) by mouth daily. 07/22/17   Steele Sizer, MD  omeprazole (PRILOSEC) 40 MG capsule Take 1 capsule (40 mg total) by mouth daily. Patient taking differently: Take 40 mg by mouth daily as needed.  07/30/16   Steele Sizer, MD  sertraline (ZOLOFT) 50 MG tablet Take 1.5 tablets (75 mg total) by mouth daily. 06/26/17   [name], MD  vitamin B-12 1000 MCG tablet Take 1 tablet (1,000 mcg total) by mouth daily. 07/27/15   Loletha Grayer, MD    Allergies Aspirin and Hctz [hydrochlorothiazide]  Family History  Problem Relation Age of Onset  . Hypertension Mother   . CVA Father   . Deep vein thrombosis Brother   . Pulmonary embolism Brother   . Anemia Sister     Social History Social History   Tobacco Use  . Smoking status: Never Smoker  . Smokeless tobacco: Never Used  Substance Use Topics  . Alcohol use: No    Alcohol/week: 0.0 standard drinks    Comment: rarely  . Drug use: No    Review of Systems  Constitutional: Negative for fever. Cardiovascular: Negative for chest pain. Respiratory: Negative for shortness of breath. Musculoskeletal: Negative for back pain. Left knee pain Skin: Negative for rash. Neurological: Negative for headaches, focal weakness or numbness. ____________________________________________  PHYSICAL EXAM:  VITAL SIGNS: ED Triage Vitals  Enc Vitals Group     BP 09/14/17 1438 (!) 149/93     Pulse Rate 09/14/17 1438 69     Resp 09/14/17 1438 18     Temp 09/14/17 1438 98.2 F (36.8 C)     Temp Source 09/14/17 1438 Oral     SpO2 09/14/17 1438 98 %     Weight 09/14/17 1439 (!) 334 lb (151.5 kg)     Height 09/14/17 1439 5\' 7"  (1.702 m)     Head Circumference --      Peak Flow --      Pain Score 09/14/17 1441 4     Pain Loc --      Pain Edu? --      Excl. in [place]? --     Constitutional: Alert and oriented. Well appearing and in no distress. Head:  Normocephalic and atraumatic. Cardiovascular: Normal rate, regular rhythm. Normal distal pulses. Respiratory: Normal respiratory effort.  Musculoskeletal: Left knee without obvious deformity, effusion, or dislocation. Normal ROM without deficit. Tender to palpation at the medial tibial plateau. Medial joint pain with valgus joint stress. No popliteal space fullness, tenderness, or calf tenderness. Nontender with normal range of motion in all other extremities.  Neurologic:  Normal gait without ataxia. Normal speech and language. No gross focal neurologic deficits are appreciated. Skin:  Skin is warm, dry and intact. No rash noted. ____________________________________________   RADIOLOGY  Left Knee  IMPRESSION: No acute findings.  Moderate osteoarthritic change. ____________________________________________  PROCEDURES  Procedures  Ace bandage ____________________________________________  INITIAL IMPRESSION / ASSESSMENT AND PLAN / ED COURSE  Patient with ED evaluation of left knee pain. Her exam is consistent with medial joint pain, which correlates with some bone spurs and moderate tricompartmental OA. She will start on Diclofenac and follow-up with ortho for ongoing symptoms. She will wear a knee brace for support as needed.  ____________________________________________  FINAL CLINICAL IMPRESSION(S) / ED DIAGNOSES  Final diagnoses:  Acute pain of left knee  Primary osteoarthritis of left knee      Melvenia Needles, PA-C 09/14/17 [place], [place], MD 09/15/17 [phone]

## 2017-09-14 NOTE — Discharge Instructions (Addendum)
Your exam and x-rays confirm moderate arthritis and a medial knee strain. Take the prescription Diclofenac as directed for pain and inflammation. Follow-up with Dr. Rudene Christians for ongoing management.

## 2017-09-14 NOTE — ED Triage Notes (Signed)
Left knee started hurting while walking around store about 1 week ago.  It will ease up but then pain keeps coming back. Weight bearing is what causes most pain. NAD. VSS

## 2017-09-23 NOTE — Progress Notes (Deleted)
[place] MD/PA/NP OP Progress Note  09/23/2017 3:56 PM Madeline Dominguez  MRN:  [id number]  Chief Complaint:  HPI: *** Visit Diagnosis: No diagnosis found.  Past Psychiatric History: Please see initial evaluation for full details. I have reviewed the history. No updates at this time.     Past Medical History:  Past Medical History:  Diagnosis Date  . Adrenal adenoma    a. normal cortisol and aldosterone levels  . Cough 07/25/2015  . Dilated cardiomyopathy ([place])    a. TTE 2/15: EF of 55-60%, dilated LV & LA, and nl RV; b. TTE 6/16:EF 55-60%, no RWMA, nl LV dias fxn, LV cavity size nl with mild concentric LVH, mildly dilated ascending aorta, LA nl size, RV sys fxn nl, PASP nl; c. TTE 7/17: EF 45-50%, unable to exclude RWMA, nl LV dias faxn, mildly dilated RV w/ nl sys fxn, PASP nl; d. TTE 7/18: EF 45-50%, mild LVH, Gr1DD, mildly dilated LA, mildly dilated RV  . Goiter    a. s/p radioactive iodine treatment in 2010  . Heart palpitations   . Hypertension   . Iron deficiency   . Large breasts   . Morbid obesity ([place])   . Postpartum hemorrhage   . Pulmonary embolism ([place]) 07/2015   a. s/p Eliquis; b. hypercoag work up neg; c. felt to be 2/2 OCP    Past Surgical History:  Procedure Laterality Date  . CESAREAN SECTION     26 years ago - repaired bleeding after vaginal birth    Family Psychiatric History: Please see initial evaluation for full details. I have reviewed the history. No updates at this time.     Family History:  Family History  Problem Relation Age of Onset  . Hypertension Mother   . CVA Father   . Deep vein thrombosis Brother   . Pulmonary embolism Brother   . Anemia Sister     Social History:  Social History   Socioeconomic History  . Marital status: Married    Spouse name: Not on file  . Number of children: 3  . Years of education: Not on file  . Highest education level: High school graduate  Occupational History  . Not on file  Social Needs  .  Financial resource strain: Not very hard  . Food insecurity:    Worry: Never true    Inability: Never true  . Transportation needs:    Medical: No    Non-medical: No  Tobacco Use  . Smoking status: Never Smoker  . Smokeless tobacco: Never Used  Substance and Sexual Activity  . Alcohol use: No    Alcohol/week: 0.0 standard drinks    Comment: rarely  . Drug use: No  . Sexual activity: Not Currently    Partners: Male    Birth control/protection: Condom  Lifestyle  . Physical activity:    Days per week: 3 days    Minutes per session: 20 min  . Stress: To some extent  Relationships  . Social connections:    Talks on phone: More than three times a week    Gets together: Twice a week    Attends religious service: 1 to 4 times per year    Active member of club or organization: No    Attends meetings of clubs or organizations: Never    Relationship status: Married  Other Topics Concern  . Not on file  Social History Narrative   Lives with 3 children, husband was incarcerated April 2018 - for stealing,  she has not been keeping in touch with him because she is disappointed. 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     Allergies:  Allergies  Allergen Reactions  . Aspirin Nausea And Vomiting  . Hctz [Hydrochlorothiazide] Other (See Comments)    Nausea, cramping    Metabolic Disorder Labs: Lab Results  Component Value Date   HGBA1C 5.0 01/18/2017   MPG 97 01/18/2017   MPG 94 04/27/2016   No results found for: PROLACTIN Lab Results  Component Value Date   CHOL 197 01/18/2017   TRIG 53 01/18/2017   HDL 81 01/18/2017   CHOLHDL 2.4 01/18/2017   VLDL 11 04/27/2016   LDLCALC 102 (H) 01/18/2017   LDLCALC 99 04/27/2016   Lab Results  Component Value Date   TSH 4.22 01/18/2017   TSH 2.21 08/10/2016    Therapeutic Level Labs: No results found for: LITHIUM No results found for: VALPROATE No components found for:  CBMZ  Current  Medications: Current Outpatient Medications  Medication Sig Dispense Refill  . amLODipine (NORVASC) 5 MG tablet Take 1 tablet by mouth daily.    Marland Kitchen amoxicillin-clavulanate (AUGMENTIN) 875-125 MG tablet Take 1 tablet by mouth 2 (two) times daily. 20 tablet 0  . cyclobenzaprine (FLEXERIL) 10 MG tablet Take 10 mg by mouth 3 (three) times daily as needed for muscle spasms.     . diclofenac (VOLTAREN) 50 MG EC tablet Take 1 tablet (50 mg total) by mouth 2 (two) times daily. 60 tablet 0  . ferrous sulfate 325 (65 FE) MG tablet Take 1 tablet (325 mg total) by mouth 2 (two) times daily with a meal. 180 tablet 1  . lisinopril (PRINIVIL,ZESTRIL) 20 MG tablet Take 1 tablet (20 mg total) by mouth daily. 90 tablet 0  . omeprazole (PRILOSEC) 40 MG capsule Take 1 capsule (40 mg total) by mouth daily. (Patient taking differently: Take 40 mg by mouth daily as needed. ) 30 capsule 3  . sertraline (ZOLOFT) 50 MG tablet Take 1.5 tablets (75 mg total) by mouth daily. 135 tablet 0  . vitamin B-12 1000 MCG tablet Take 1 tablet (1,000 mcg total) by mouth daily. 30 tablet 0   No current facility-administered medications for this visit.      Musculoskeletal: Strength & Muscle Tone: within normal limits Gait & Station: normal Patient leans: N/A  Psychiatric Specialty Exam: ROS  There were no vitals taken for this visit.There is no height or weight on file to calculate BMI.  General Appearance: Fairly Groomed  Eye Contact:  Good  Speech:  Clear and Coherent  Volume:  Normal  Mood:  {BHH MOOD:22306}  Affect:  {Affect (PAA):22687}  Thought Process:  Coherent  Orientation:  Full (Time, Place, and Person)  Thought Content: Logical   Suicidal Thoughts:  {ST/HT (PAA):22692}  Homicidal Thoughts:  {ST/HT (PAA):22692}  Memory:  Immediate;   Good  Judgement:  {Judgement (PAA):22694}  Insight:  {Insight (PAA):22695}  Psychomotor Activity:  Normal  Concentration:  Concentration: Good and Attention Span: Good   Recall:  Good  Fund of Knowledge: Good  Language: Good  Akathisia:  No  Handed:  Right  AIMS (if indicated): not done  Assets:  Communication Skills Desire for Improvement  ADL's:  Intact  Cognition: WNL  Sleep:  {BHH GOOD/FAIR/POOR:22877}   Screenings: GAD-7     Office Visit from 07/22/2017 in [facility]  Total GAD-7 Score  3    PHQ2-9  Office Visit from 07/22/2017 in [facility] Office Visit from 01/18/2017 in [facility] Office Visit from 10/04/2016 in [facility] Office Visit from 06/22/2016 in [facility] Office Visit from 12/20/2015 in [facility]  PHQ-2 Total Score  2  0  0  0  0  PHQ-9 Total Score  5  -  -  -  -       Assessment and Plan:  Madeline Dominguez is a 52 y.o. year old female with a history of depression, hypertension,  dilated cardiomyopathy,adrenal adenoma (undergoing evaluation),bilateral pulmonary embolism, obesity, obstructive sleep apnea on CPAP, history of toxic multinodular goiter , who presents for follow up appointment for No diagnosis found.  # MDD, moderate, recurrent without psychotic features   Exam is notable for euthymic affect, and patient reports improvement in neurovegetative symptoms after up titration of sertraline.  Will continue sertraline to target depression.  Discussed behavioral activation.   Plan 1. Continue sertraline 75 mg daily  2.Return to clinic inthee months for 15 mins  The patient demonstrates the following risk factors for suicide: Chronic risk factors for suicide include: psychiatric disorder of depression. 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 Protective factorsfor this patient include: responsibility to others (children, family) and coping skills. Considering these factors, the overall suicide risk at this point appears to be low. Patient isappropriate for  outpatient follow up.  [name], MD 09/23/2017, 3:56 PM

## 2017-09-26 ENCOUNTER — Ambulatory Visit (HOSPITAL_COMMUNITY): Payer: Self-pay | Admitting: Psychiatry

## 2017-09-29 ENCOUNTER — Encounter: Payer: Self-pay | Admitting: Family Medicine

## 2017-09-30 ENCOUNTER — Other Ambulatory Visit: Payer: Self-pay | Admitting: Family Medicine

## 2017-09-30 ENCOUNTER — Telehealth (HOSPITAL_COMMUNITY): Payer: Self-pay | Admitting: Psychiatry

## 2017-09-30 DIAGNOSIS — M170 Bilateral primary osteoarthritis of knee: Secondary | ICD-10-CM

## 2017-09-30 MED ORDER — SERTRALINE HCL 50 MG PO TABS: 75.0000 mg | ORAL_TABLET | Freq: Every day | ORAL | 0 refills | Status: DC

## 2017-09-30 NOTE — Telephone Encounter (Signed)
Spoke with patient & per provider informed : Ordered refill for sertraline per request. Please contact the patient to make follow up appointment, and notify of no show policy. Will not order another refill without evaluation.

## 2017-09-30 NOTE — Telephone Encounter (Signed)
Ordered refill for sertraline per request. Please contact the patient to make follow up appointment, and notify of no show policy. Will not order another refill without evaluation.

## 2017-10-03 ENCOUNTER — Ambulatory Visit (INDEPENDENT_AMBULATORY_CARE_PROVIDER_SITE_OTHER): Payer: 59 | Admitting: Psychiatry

## 2017-10-03 ENCOUNTER — Encounter (HOSPITAL_COMMUNITY): Payer: Self-pay | Admitting: Psychiatry

## 2017-10-03 VITALS — BP 178/98 | HR 73 | Ht 67.0 in | Wt 350.0 lb

## 2017-10-03 DIAGNOSIS — F321 Major depressive disorder, single episode, moderate: Secondary | ICD-10-CM

## 2017-10-03 NOTE — Patient Instructions (Signed)
1. Continue sertraline 75 mg daily  2.Return to clinic inthee months for 15 mins

## 2017-10-03 NOTE — Progress Notes (Signed)
[place] MD/PA/NP OP Progress Note  10/03/2017 11:03 AM Madeline Dominguez  MRN:  [id number]  Chief Complaint:  Chief Complaint    Follow-up; Depression     HPI:  Patient presents for follow-up appointment for depression.  She states that she had a meeting with her husband and her children.  They have decided that she will give him another chance and will live together after he is released from prison.  They discussed that she will divorce if he did similar activity as he used to.  She becomes tearful while describing this, stating that she is unsure if it is worth it to give him another chance.  She states that she always has had trust issues with him. It was a big "leap" for her when she got married with him.  She states that she has been busy at work as a company lost [employer].  She has been able to function at work.  She has occasional insomnia.  She denies feeling depressed.  She occasionally eat large meals.  She has fair concentration.  She denies SI.  She feels less anxious.  She denies panic attacks.  She has not been able to do exercise due to her knee pain.    Wt Readings from Last 3 Encounters:  10/03/17 (!) 350 lb (158.8 kg)  09/14/17 (!) 334 lb (151.5 kg)  07/22/17 (!) 345 lb 11.2 oz (156.8 kg)   03/19/17 (!) 350 lb (158.8 kg)     Visit Diagnosis:    ICD-10-CM   1. Current moderate episode of major depressive disorder without prior episode ([facility] [place]) F32.1     Past Psychiatric History: Please see initial evaluation for full details. I have reviewed the history. No updates at this time.     Past Medical History:  Past Medical History:  Diagnosis Date  . Adrenal adenoma    a. normal cortisol and aldosterone levels  . Cough 07/25/2015  . Dilated cardiomyopathy ([place])    a. TTE 2/15: EF of 55-60%, dilated LV & LA, and nl RV; b. TTE 6/16:EF 55-60%, no RWMA, nl LV dias fxn, LV cavity size nl with mild concentric LVH, mildly dilated ascending aorta, LA nl size, RV sys fxn nl,  PASP nl; c. TTE 7/17: EF 45-50%, unable to exclude RWMA, nl LV dias faxn, mildly dilated RV w/ nl sys fxn, PASP nl; d. TTE 7/18: EF 45-50%, mild LVH, Gr1DD, mildly dilated LA, mildly dilated RV  . Goiter    a. s/p radioactive iodine treatment in 2010  . Heart palpitations   . Hypertension   . Iron deficiency   . Large breasts   . Morbid obesity ([place])   . Postpartum hemorrhage   . Pulmonary embolism ([place]) 07/2015   a. s/p Eliquis; b. hypercoag work up neg; c. felt to be 2/2 OCP    Past Surgical History:  Procedure Laterality Date  . CESAREAN SECTION     26 years ago - repaired bleeding after vaginal birth    Family Psychiatric History: Please see initial evaluation for full details. I have reviewed the history. No updates at this time.     Family History:  Family History  Problem Relation Age of Onset  . Hypertension Mother   . CVA Father   . Deep vein thrombosis Brother   . Pulmonary embolism Brother   . Anemia Sister     Social History:  Social History   Socioeconomic History  . Marital status: Married    Spouse  name: Not on file  . Number of children: 3  . Years of education: Not on file  . Highest education level: High school graduate  Occupational History  . Not on file  Social Needs  . Financial resource strain: Not very hard  . Food insecurity:    Worry: Never true    Inability: Never true  . Transportation needs:    Medical: No    Non-medical: No  Tobacco Use  . Smoking status: Never Smoker  . Smokeless tobacco: Never Used  Substance and Sexual Activity  . Alcohol use: No    Alcohol/week: 0.0 standard drinks    Comment: rarely  . Drug use: No  . Sexual activity: Not Currently    Partners: Male    Birth control/protection: Condom  Lifestyle  . Physical activity:    Days per week: 3 days    Minutes per session: 20 min  . Stress: To some extent  Relationships  . Social connections:    Talks on phone: More than three times a week    Gets  together: Twice a week    Attends religious service: 1 to 4 times per year    Active member of club or organization: No    Attends meetings of clubs or organizations: Never    Relationship status: Married  Other Topics Concern  . Not on file  Social History Narrative   Lives with 3 children, husband was incarcerated April 2018 - for stealing, she has not been keeping in touch with him because she is disappointed. 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     Allergies:  Allergies  Allergen Reactions  . Aspirin Nausea And Vomiting  . Hctz [Hydrochlorothiazide] Other (See Comments)    Nausea, cramping    Metabolic Disorder Labs: Lab Results  Component Value Date   HGBA1C 5.0 01/18/2017   MPG 97 01/18/2017   MPG 94 04/27/2016   No results found for: PROLACTIN Lab Results  Component Value Date   CHOL 197 01/18/2017   TRIG 53 01/18/2017   HDL 81 01/18/2017   CHOLHDL 2.4 01/18/2017   VLDL 11 04/27/2016   LDLCALC 102 (H) 01/18/2017   LDLCALC 99 04/27/2016   Lab Results  Component Value Date   TSH 4.22 01/18/2017   TSH 2.21 08/10/2016    Therapeutic Level Labs: No results found for: LITHIUM No results found for: VALPROATE No components found for:  CBMZ  Current Medications: Current Outpatient Medications  Medication Sig Dispense Refill  . amLODipine (NORVASC) 5 MG tablet Take 1 tablet by mouth daily.    Marland Kitchen amoxicillin-clavulanate (AUGMENTIN) 875-125 MG tablet Take 1 tablet by mouth 2 (two) times daily. 20 tablet 0  . cyclobenzaprine (FLEXERIL) 10 MG tablet Take 10 mg by mouth 3 (three) times daily as needed for muscle spasms.     . diclofenac (VOLTAREN) 50 MG EC tablet Take 1 tablet (50 mg total) by mouth 2 (two) times daily. 60 tablet 0  . ferrous sulfate 325 (65 FE) MG tablet Take 1 tablet (325 mg total) by mouth 2 (two) times daily with a meal. 180 tablet 1  . lisinopril (PRINIVIL,ZESTRIL) 20 MG tablet Take 1 tablet (20 mg  total) by mouth daily. 90 tablet 0  . omeprazole (PRILOSEC) 40 MG capsule Take 1 capsule (40 mg total) by mouth daily. (Patient taking differently: Take 40 mg by mouth daily as needed. ) 30 capsule 3  . sertraline (ZOLOFT)  50 MG tablet Take 1.5 tablets (75 mg total) by mouth daily. 135 tablet 0  . vitamin B-12 1000 MCG tablet Take 1 tablet (1,000 mcg total) by mouth daily. 30 tablet 0   No current facility-administered medications for this visit.      Musculoskeletal: Strength & Muscle Tone: within normal limits Gait & Station: normal Patient leans: N/A  Psychiatric Specialty Exam: Review of Systems  Psychiatric/Behavioral: Negative for depression, hallucinations, memory loss, substance abuse and suicidal ideas. The patient is nervous/anxious. The patient does not have insomnia.   All other systems reviewed and are negative.   Blood pressure (!) 178/98, pulse 73, height 5\' 7"  (1.702 m), weight (!) 350 lb (158.8 kg), SpO2 95 %.Body mass index is 54.82 kg/m.  General Appearance: Fairly Groomed  Eye Contact:  Good  Speech:  Clear and Coherent  Volume:  Normal  Mood:  "good"  Affect:  Appropriate, Congruent, Tearful and euthymic, reactive  Thought Process:  Coherent  Orientation:  Full (Time, Place, and Person)  Thought Content: Logical   Suicidal Thoughts:  No  Homicidal Thoughts:  No  Memory:  Immediate;   Good  Judgement:  Good  Insight:  Fair  Psychomotor Activity:  Normal  Concentration:  Concentration: Good and Attention Span: Good  Recall:  Good  Fund of Knowledge: Good  Language: Good  Akathisia:  No  Handed:  Right  AIMS (if indicated): not done  Assets:  Communication Skills Desire for Improvement  ADL's:  Intact  Cognition: WNL  Sleep:  Fair   Screenings: GAD-7     Office Visit from 07/22/2017 in [facility]  Total GAD-7 Score  3    PHQ2-9     Office Visit from 07/22/2017 in [facility] Office Visit from 01/18/2017 in  [facility] Office Visit from 10/04/2016 in [facility] Office Visit from 06/22/2016 in [facility] Office Visit from 12/20/2015 in [facility]  PHQ-2 Total Score  2  0  0  0  0  PHQ-9 Total Score  5  -  -  -  -       Assessment and Plan:  Madeline Dominguez is a 52 y.o. 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  Psychosocial stressors including her husband who is currently in jail.  Will continue sertraline to target depression.  Noted that although she did have weight gain, it is more likely due to her change in lifestyle, rather than medication.  Discussed self compassion.  Discussed behavioral activation.   Plan I have reviewed and updated plans as below 1. Continue sertraline 75 mg daily  2.Return to clinic inthee months for 15 mins  The patient demonstrates the following risk factors for suicide: Chronic risk factors for suicide include: psychiatric disorder of depression. 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 Protective factorsfor this patient include: responsibility to others (children, family) and coping skills. Considering these factors, the overall suicide risk at this point appears to be low. Patient isappropriate for outpatient follow up.  [name], MD 10/03/2017, 11:03 AM

## 2017-10-11 DIAGNOSIS — M170 Bilateral primary osteoarthritis of knee: Secondary | ICD-10-CM | POA: Diagnosis not present

## 2017-10-16 DIAGNOSIS — N3000 Acute cystitis without hematuria: Secondary | ICD-10-CM | POA: Diagnosis not present

## 2017-12-18 ENCOUNTER — Other Ambulatory Visit: Payer: Self-pay | Admitting: Family Medicine

## 2017-12-18 DIAGNOSIS — I10 Essential (primary) hypertension: Secondary | ICD-10-CM

## 2017-12-18 NOTE — Telephone Encounter (Signed)
Refill request for Hypertension medication:  Lisinopril 20 mg  Last office visit pertaining to hypertension: 07/22/2017  BP Readings from Last 3 Encounters:  09/14/17 (!) 157/100  07/22/17 138/86  03/25/17 134/78     Lab Results  Component Value Date   CREATININE 0.68 01/18/2017   BUN 10 01/18/2017   NA 141 01/18/2017   K 4.1 01/18/2017   CL 105 01/18/2017   CO2 29 01/18/2017    Follow-ups on file. 01/24/2018

## 2018-01-02 ENCOUNTER — Telehealth (HOSPITAL_COMMUNITY): Payer: Self-pay

## 2018-01-02 DIAGNOSIS — F321 Major depressive disorder, single episode, moderate: Secondary | ICD-10-CM

## 2018-01-02 MED ORDER — SERTRALINE HCL 50 MG PO TABS: 75.0000 mg | ORAL_TABLET | Freq: Every day | ORAL | 0 refills | Status: DC

## 2018-01-02 NOTE — Telephone Encounter (Signed)
Medication refill request - Called patient back after receiving a message she was in need of a new Zoloft 75 mg a day order and Dr. Modesta Messing not available to assist. Pt was rescheduled from 01/07/18 to 01/30/18 due to Dr. Modesta Mess[place] out of the [place]. Met with Dr. Adele Schilder from the [facility] office to request refill for patient as Dr. Harrington Challenger is not at the [facility] office this date as well and patient is running out.  Medical record reviewed and Dr. Adele Schilder provided the verbal order to refill patient's Zoloft for 90 days at 50 mg, 1.5 tablets per day, #135 with no refills.  Patient informed this nurse the order needed to go to her [facility] in [place] and order sent at requested.  Patient agreed to call back if any problems filling new prescription and agreed to keep appointment with Dr. Modesta Messing now set for 01/30/18.

## 2018-01-07 ENCOUNTER — Ambulatory Visit (HOSPITAL_COMMUNITY): Payer: Self-pay | Admitting: Psychiatry

## 2018-01-24 ENCOUNTER — Ambulatory Visit: Payer: 59 | Admitting: Family Medicine

## 2018-01-24 ENCOUNTER — Encounter: Payer: Self-pay | Admitting: Family Medicine

## 2018-01-24 VITALS — BP 130/90 | HR 80 | Temp 98.1°F | Resp 16 | Ht 67.0 in | Wt 365.9 lb

## 2018-01-24 DIAGNOSIS — E538 Deficiency of other specified B group vitamins: Secondary | ICD-10-CM | POA: Diagnosis not present

## 2018-01-24 DIAGNOSIS — F320 Major depressive disorder, single episode, mild: Secondary | ICD-10-CM | POA: Diagnosis not present

## 2018-01-24 DIAGNOSIS — Z131 Encounter for screening for diabetes mellitus: Secondary | ICD-10-CM

## 2018-01-24 DIAGNOSIS — E279 Disorder of adrenal gland, unspecified: Secondary | ICD-10-CM | POA: Diagnosis not present

## 2018-01-24 DIAGNOSIS — E0520 Thyrotoxicosis with toxic multinodular goiter without thyrotoxic crisis or storm: Secondary | ICD-10-CM

## 2018-01-24 DIAGNOSIS — E0590 Thyrotoxicosis, unspecified without thyrotoxic crisis or storm: Secondary | ICD-10-CM | POA: Diagnosis not present

## 2018-01-24 DIAGNOSIS — I10 Essential (primary) hypertension: Secondary | ICD-10-CM

## 2018-01-24 DIAGNOSIS — E278 Other specified disorders of adrenal gland: Secondary | ICD-10-CM

## 2018-01-24 DIAGNOSIS — I422 Other hypertrophic cardiomyopathy: Secondary | ICD-10-CM

## 2018-01-24 DIAGNOSIS — K130 Diseases of lips: Secondary | ICD-10-CM

## 2018-01-24 DIAGNOSIS — Z23 Encounter for immunization: Secondary | ICD-10-CM

## 2018-01-24 DIAGNOSIS — D500 Iron deficiency anemia secondary to blood loss (chronic): Secondary | ICD-10-CM

## 2018-01-24 DIAGNOSIS — Z923 Personal history of irradiation: Secondary | ICD-10-CM

## 2018-01-24 MED ORDER — AMLODIPINE BESYLATE 5 MG PO TABS: 5.0000 mg | ORAL_TABLET | Freq: Every day | ORAL | 1 refills | Status: DC

## 2018-01-24 MED ORDER — NYSTATIN-TRIAMCINOLONE 100000-0.1 UNIT/GM-% EX OINT: 1.0000 "application " | TOPICAL_OINTMENT | Freq: Two times a day (BID) | CUTANEOUS | 0 refills | Status: DC

## 2018-01-24 MED ORDER — LISINOPRIL 20 MG PO TABS: 20.0000 mg | ORAL_TABLET | Freq: Every day | ORAL | 1 refills | Status: DC

## 2018-01-24 NOTE — Progress Notes (Signed)
Name: JAQUAYA COYLE   MRN: [id number]    DOB: 01-16-1965   Date:01/24/2018       Progress Note  Subjective  Chief Complaint  Chief Complaint  Patient presents with  . Hypertension  . Osteoarthritis  . dry mouth    HPI  Cardiomyopathy: denies orthopnea or decrease in exercise tolerance, on lisinopril. No chest pain or palpitation. Secondary to hyperthyroidism and uncontrolled HTN, but bp is at goal now   HTN: bp is at goal, used to see nephrologist , however doing well now, on Norvasc 5 and lisinopril 20 mg, no chest pain or palpation DBP is borderline today we will adjust medication next visit if it does not go down  Depression: she is feeling much better having normal energy level, smiling much more, seeing Dr. Valentina Gu and taking Zoloft phq 9 is 4 today and the sleep problem is likely because she works 3rd shift. Denies suicidal thoughts or ideation  Adrenal nodule left: no symptoms, many tests done, seeing endocrinologist at [facility], and still being evaluated.  Had follow up last year   Goiter: no dysphagia, recently had [place], seeing endo, not on medication, had an ablation and is not on thyroid medication, we will recheck TSH since I was not able to find a recent one on [facility] records   Obesity: discussed life style modification again, she has gained 16 lbs since last visit and explained importance of weight loss  GERD: taking medication prn only and is doing well. Unchanged   Anemia: iron studies was not done in the past , advised to have colonoscopy instead of cologuard, but she states she prefers cologuard and if positive get colonoscopy. We will repeat labs today   Patient Active Problem List   Diagnosis Date Noted  . Stress incontinence in female 03/25/2017  . History of deep venous thrombosis (DVT) of distal vein of right lower extremity 01/18/2017  . Adrenal adenoma, left 09/11/2016  . Current moderate episode of major depressive disorder without prior episode ([place])  08/10/2016  . 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 History of radioactive iodine thyroid ablation   . 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 Iron deficiency anemia 07/27/2015  . B12 deficiency 07/27/2015  . History of pulmonary embolus (PE) 07/25/2015  . Adrenal nodule ([place]) 07/25/2015  . Pulmonary nodules/lesions, multiple 07/25/2015  . OSA (obstructive sleep apnea) 05/18/2015  . Thyroid nodule 12/17/2014  . Heavy menstrual period 09/02/2014  . Gastroesophageal reflux disease without esophagitis 09/02/2014  . Morbid obesity, unspecified obesity type ([place]) 06/22/2014  . Large breasts 06/22/2014  . Anemia, iron deficiency 06/22/2014  . H/O hyperthyroidism 06/22/2014  . Right ventricular dilation 06/22/2014  . 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    Past Surgical History:  Procedure Laterality Date  . CESAREAN SECTION     26 years ago - repaired bleeding after vaginal birth    Family History  Problem Relation Age of Onset  . Hypertension Mother   . CVA Father   . Deep vein thrombosis Brother   . Pulmonary embolism Brother   . Anemia Sister     Social History   Socioeconomic History  . Marital status: Married    Spouse name: Not on file  . Number of children: 3  . Years of education: Not on file  . Highest education level: High school graduate  Occupational History  . Not on file  Social Needs  . Financial  resource strain: Not very hard  . Food insecurity:    Worry: Never true    Inability: Never true  . Transportation needs:    Medical: No    Non-medical: No  Tobacco Use  . Smoking status: Never Smoker  . Smokeless tobacco: Never Used  Substance and Sexual Activity  . Alcohol use: No    Alcohol/week: 0.0 standard drinks    Comment: rarely  . Drug use: No  . Sexual activity: Not Currently    Partners: Male    Birth control/protection: Condom   Lifestyle  . Physical activity:    Days per week: 3 days    Minutes per session: 20 min  . Stress: To some extent  Relationships  . Social connections:    Talks on phone: More than three times a week    Gets together: Twice a week    Attends religious service: 1 to 4 times per year    Active member of club or organization: No    Attends meetings of clubs or organizations: Never    Relationship status: Married  . Intimate partner violence:    Fear of current or ex partner: No    Emotionally abused: No    Physically abused: No    Forced sexual activity: No  Other Topics Concern  . Not on file  Social History Narrative   Lives with 3 children, husband was incarcerated April 2018 - for stealing, she has not been keeping in touch with him because she is disappointed. 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      Current Outpatient Medications:  .  amLODipine (NORVASC) 5 MG tablet, Take 1 tablet (5 mg total) by mouth daily., Disp: 90 tablet, Rfl: 1 .  ferrous sulfate 325 (65 FE) MG tablet, Take 1 tablet (325 mg total) by mouth 2 (two) times daily with a meal., Disp: 180 tablet, Rfl: 1 .  lisinopril (PRINIVIL,ZESTRIL) 20 MG tablet, Take 1 tablet (20 mg total) by mouth daily., Disp: 90 tablet, Rfl: 1 .  omeprazole (PRILOSEC) 40 MG capsule, Take 1 capsule (40 mg total) by mouth daily. (Patient taking differently: Take 40 mg by mouth daily as needed. ), Disp: 30 capsule, Rfl: 3 .  sertraline (ZOLOFT) 50 MG tablet, Take 1.5 tablets (75 mg total) by mouth daily., Disp: 135 tablet, Rfl: 0 .  vitamin B-12 1000 MCG tablet, Take 1 tablet (1,000 mcg total) by mouth daily., Disp: 30 tablet, Rfl: 0 .  meloxicam (MOBIC) 15 MG tablet, Take 15 mg by mouth daily., Disp: , Rfl:  .  nystatin-triamcinolone ointment (MYCOLOG), Apply 1 application topically 2 (two) times daily., Disp: 30 g, Rfl: 0  Allergies  Allergen Reactions  . Aspirin Nausea And Vomiting  .  Hctz [Hydrochlorothiazide] Other (See Comments)    Nausea, cramping    I personally reviewed active problem list, medication list, allergies, family history, social history with the patient/caregiver today.   ROS  Constitutional: Negative for fever, positive for  weight change.  Respiratory: Negative for cough and shortness of breath.   Cardiovascular: Negative for chest pain or palpitations.  Gastrointestinal: Negative for abdominal pain, no bowel changes.  Musculoskeletal: Negative for gait problem or joint swelling.  Skin: Negative for rash.  Neurological: Negative for dizziness or headache.  No other specific complaints in a complete review of systems (except as listed in HPI above).  Objective  Vitals:   01/24/18 1123  BP: 130/90  Pulse: 80  Resp: 16  Temp: 98.1 F (36.7 C)  TempSrc: Oral  SpO2: 98%  Weight: (!) 365 lb 14.4 oz (166 kg)  Height: 5\' 7"  (1.702 m)    Body mass index is 57.31 kg/m.  Physical Exam  Constitutional: Patient appears well-developed and well-nourished. Obese No distress.  HEENT: head atraumatic, normocephalic, pupils equal and reactive to light,, neck supple, throat within normal limits Cardiovascular: Normal rate, regular rhythm and normal heart sounds.  No murmur heard. No BLE edema. Pulmonary/Chest: Effort normal and breath sounds normal. No respiratory distress. Abdominal: Soft.  There is no tenderness. Psychiatric: Patient has a normal mood and affect. behavior is normal. Judgment and thought content normal.  PHQ2/9: Depression screen [facility] 2/9 01/24/2018 07/22/2017 01/18/2017 10/04/2016 06/22/2016  Decreased Interest 0 1 0 0 0  Down, Depressed, Hopeless 0 1 0 0 0  PHQ - 2 Score 0 2 0 0 0  Altered sleeping 3 0 - - -  Tired, decreased energy 1 1 - - -  Change in appetite 0 1 - - -  Feeling bad or failure about yourself  0 1 - - -  Trouble concentrating 0 0 - - -  Moving slowly or fidgety/restless 0 0 - - -  Suicidal thoughts 0 0 - - -   PHQ-9 Score 4 5 - - -  Difficult doing work/chores Not difficult at all Not difficult at all - - -    Fall Risk: Fall Risk  01/24/2018 07/22/2017 01/18/2017 10/04/2016 06/22/2016  Falls in the past year? 0 No No No No  Number falls in past yr: 0 - - - -  Injury with Fall? 0 - - - -     Assessment & Plan  1. 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 Morbid obesity, unspecified obesity type [facility])  Discussed with the patient the risk posed by an increased BMI. Discussed importance of portion control, calorie counting and at least 150 minutes of physical activity weekly. Avoid sweet beverages and drink more water. Eat at least 6 servings of fruit and vegetables daily   3. Mild Major Depression([facility])  Continue Zoloft   4. Adrenal nodule ([facility])  Likely an adenoma  5. Needs flu shot refused  6. Cheilitis  - nystatin-triamcinolone ointment (MYCOLOG); Apply 1 application topically 2 (two) times daily.  Dispense: 30 g; Refill: 0  7. B12 deficiency  - CBC with Differential/Platelet - B12 and Folate Panel  8-Anemia, iron deficiency   - CBC with Differential/Platelet - Iron, TIBC and Ferritin Panel  9. Hypertension, benign  - COMPLETE METABOLIC PANEL WITH GFR - CBC with Differential/Platelet - Lipid panel  10. Goiter, toxic, multinodular  Sees Endo in [place] but did not have TSH done  11. History of radioactive iodine thyroid ablation  - TSH  12. Diabetes mellitus screening  - Hemoglobin A1c

## 2018-01-25 LAB — CBC WITH DIFFERENTIAL/PLATELET
ABSOLUTE MONOCYTES: 372 {cells}/uL (ref 200–950)
Basophils Absolute: 20 cells/uL (ref 0–200)
Basophils Relative: 0.4 %
EOS PCT: 2.4 %
Eosinophils Absolute: 122 cells/uL (ref 15–500)
HCT: 38 % (ref 35.0–45.0)
Hemoglobin: 12.3 g/dL (ref 11.7–15.5)
Lymphs Abs: 2183 cells/uL (ref 850–3900)
MCH: 29.8 pg (ref 27.0–33.0)
MCHC: 32.4 g/dL (ref 32.0–36.0)
MCV: 92 fL (ref 80.0–100.0)
MPV: 11.1 fL (ref 7.5–12.5)
Monocytes Relative: 7.3 %
Neutro Abs: 2402 cells/uL (ref 1500–7800)
Neutrophils Relative %: 47.1 %
PLATELETS: 267 10*3/uL (ref 140–400)
RBC: 4.13 10*6/uL (ref 3.80–5.10)
RDW: 12.5 % (ref 11.0–15.0)
TOTAL LYMPHOCYTE: 42.8 %
WBC: 5.1 10*3/uL (ref 3.8–10.8)

## 2018-01-25 LAB — COMPLETE METABOLIC PANEL WITH GFR
AG Ratio: 1.1 (calc) (ref 1.0–2.5)
ALT: 5 U/L — ABNORMAL LOW (ref 6–29)
AST: 10 U/L (ref 10–35)
Albumin: 3.7 g/dL (ref 3.6–5.1)
Alkaline phosphatase (APISO): 87 U/L (ref 33–130)
BUN: 12 mg/dL (ref 7–25)
CO2: 30 mmol/L (ref 20–32)
Calcium: 8.9 mg/dL (ref 8.6–10.4)
Chloride: 106 mmol/L (ref 98–110)
Creat: 0.62 mg/dL (ref 0.50–1.05)
GFR, Est African American: 122 mL/min/{1.73_m2} (ref >=60)
GFR, Est Non African American: 105 mL/min/{1.73_m2} (ref >=60)
Globulin: 3.3 g/dL (calc) (ref 1.9–3.7)
Glucose, Bld: 101 mg/dL — ABNORMAL HIGH (ref 65–99)
Potassium: 4.1 mmol/L (ref 3.5–5.3)
Sodium: 142 mmol/L (ref 135–146)
Total Bilirubin: 0.4 mg/dL (ref 0.2–1.2)
Total Protein: 7 g/dL (ref 6.1–8.1)

## 2018-01-25 LAB — HEMOGLOBIN A1C
Hgb A1c MFr Bld: 5 % of total Hgb (ref <5.7)
MEAN PLASMA GLUCOSE: 97 (calc)
eAG (mmol/L): 5.4 (calc)

## 2018-01-25 LAB — B12 AND FOLATE PANEL
FOLATE: 7.7 ng/mL
VITAMIN B 12: 523 pg/mL (ref 200–1100)

## 2018-01-25 LAB — IRON,TIBC AND FERRITIN PANEL
%SAT: 25 % (calc) (ref 16–45)
Ferritin: 58 ng/mL (ref 16–232)
Iron: 68 ug/dL (ref 45–160)
TIBC: 268 ug/dL (ref 250–450)

## 2018-01-25 LAB — LIPID PANEL
CHOLESTEROL: 211 mg/dL — AB (ref <200)
HDL: 73 mg/dL (ref >50)
LDL CHOLESTEROL (CALC): 124 mg/dL — AB
Non-HDL Cholesterol (Calc): 138 mg/dL (calc) — ABNORMAL HIGH (ref <130)
TRIGLYCERIDES: 47 mg/dL (ref <150)
Total CHOL/HDL Ratio: 2.9 (calc) (ref <5.0)

## 2018-01-25 LAB — TSH: TSH: 4.14 mIU/L

## 2018-01-28 NOTE — Progress Notes (Deleted)
[place] MD/PA/NP OP Progress Note  01/28/2018 10:01 AM JORI FRERICHS  MRN:  [id number]  Chief Complaint:  HPI: *** Visit Diagnosis: No diagnosis found.  Past Psychiatric History: Please see initial evaluation for full details. I have reviewed the history. No updates at this time.     Past Medical History:  Past Medical History:  Diagnosis Date  . Adrenal adenoma    a. normal cortisol and aldosterone levels  . Cough 07/25/2015  . Dilated cardiomyopathy ([place])    a. TTE 2/15: EF of 55-60%, dilated LV & LA, and nl RV; b. TTE 6/16:EF 55-60%, no RWMA, nl LV dias fxn, LV cavity size nl with mild concentric LVH, mildly dilated ascending aorta, LA nl size, RV sys fxn nl, PASP nl; c. TTE 7/17: EF 45-50%, unable to exclude RWMA, nl LV dias faxn, mildly dilated RV w/ nl sys fxn, PASP nl; d. TTE 7/18: EF 45-50%, mild LVH, Gr1DD, mildly dilated LA, mildly dilated RV  . Goiter    a. s/p radioactive iodine treatment in 2010  . Heart palpitations   . Hypertension   . Iron deficiency   . Large breasts   . Morbid obesity ([place])   . Postpartum hemorrhage   . Pulmonary embolism ([place]) 07/2015   a. s/p Eliquis; b. hypercoag work up neg; c. felt to be 2/2 OCP    Past Surgical History:  Procedure Laterality Date  . CESAREAN SECTION     26 years ago - repaired bleeding after vaginal birth    Family Psychiatric History: Please see initial evaluation for full details. I have reviewed the history. No updates at this time.     Family History:  Family History  Problem Relation Age of Onset  . Hypertension Mother   . CVA Father   . Deep vein thrombosis Brother   . Pulmonary embolism Brother   . Anemia Sister     Social History:  Social History   Socioeconomic History  . Marital status: Married    Spouse name: Not on file  . Number of children: 3  . Years of education: Not on file  . Highest education level: High school graduate  Occupational History  . Not on file  Social Needs  .  Financial resource strain: Not very hard  . Food insecurity:    Worry: Never true    Inability: Never true  . Transportation needs:    Medical: No    Non-medical: No  Tobacco Use  . Smoking status: Never Smoker  . Smokeless tobacco: Never Used  Substance and Sexual Activity  . Alcohol use: No    Alcohol/week: 0.0 standard drinks    Comment: rarely  . Drug use: No  . Sexual activity: Not Currently    Partners: Male    Birth control/protection: Condom  Lifestyle  . Physical activity:    Days per week: 3 days    Minutes per session: 20 min  . Stress: To some extent  Relationships  . Social connections:    Talks on phone: More than three times a week    Gets together: Twice a week    Attends religious service: 1 to 4 times per year    Active member of club or organization: No    Attends meetings of clubs or organizations: Never    Relationship status: Married  Other Topics Concern  . Not on file  Social History Narrative   Lives with 3 children, husband was incarcerated April 2018 - for stealing,  she has not been keeping in touch with him because she is disappointed. 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     Allergies:  Allergies  Allergen Reactions  . Aspirin Nausea And Vomiting  . Hctz [Hydrochlorothiazide] Other (See Comments)    Nausea, cramping    Metabolic Disorder Labs: Lab Results  Component Value Date   HGBA1C 5.0 01/24/2018   MPG 97 01/24/2018   MPG 97 01/18/2017   No results found for: PROLACTIN Lab Results  Component Value Date   CHOL 211 (H) 01/24/2018   TRIG 47 01/24/2018   HDL 73 01/24/2018   CHOLHDL 2.9 01/24/2018   VLDL 11 04/27/2016   LDLCALC 124 (H) 01/24/2018   LDLCALC 102 (H) 01/18/2017   Lab Results  Component Value Date   TSH 4.14 01/24/2018   TSH 4.22 01/18/2017    Therapeutic Level Labs: No results found for: LITHIUM No results found for: VALPROATE No components found for:   CBMZ  Current Medications: Current Outpatient Medications  Medication Sig Dispense Refill  . amLODipine (NORVASC) 5 MG tablet Take 1 tablet (5 mg total) by mouth daily. 90 tablet 1  . ferrous sulfate 325 (65 FE) MG tablet Take 1 tablet (325 mg total) by mouth 2 (two) times daily with a meal. 180 tablet 1  . lisinopril (PRINIVIL,ZESTRIL) 20 MG tablet Take 1 tablet (20 mg total) by mouth daily. 90 tablet 1  . meloxicam (MOBIC) 15 MG tablet Take 15 mg by mouth daily.    Marland Kitchen nystatin-triamcinolone ointment (MYCOLOG) Apply 1 application topically 2 (two) times daily. 30 g 0  . omeprazole (PRILOSEC) 40 MG capsule Take 1 capsule (40 mg total) by mouth daily. (Patient taking differently: Take 40 mg by mouth daily as needed. ) 30 capsule 3  . sertraline (ZOLOFT) 50 MG tablet Take 1.5 tablets (75 mg total) by mouth daily. 135 tablet 0  . vitamin B-12 1000 MCG tablet Take 1 tablet (1,000 mcg total) by mouth daily. 30 tablet 0   No current facility-administered medications for this visit.      Musculoskeletal: Strength & Muscle Tone: within normal limits Gait & Station: normal Patient leans: N/A  Psychiatric Specialty Exam: ROS  There were no vitals taken for this visit.There is no height or weight on file to calculate BMI.  General Appearance: Fairly Groomed  Eye Contact:  Good  Speech:  Clear and Coherent  Volume:  Normal  Mood:  {BHH MOOD:22306}  Affect:  {Affect (PAA):22687}  Thought Process:  Coherent  Orientation:  Full (Time, Place, and Person)  Thought Content: Logical   Suicidal Thoughts:  {ST/HT (PAA):22692}  Homicidal Thoughts:  {ST/HT (PAA):22692}  Memory:  Immediate;   Good  Judgement:  {Judgement (PAA):22694}  Insight:  {Insight (PAA):22695}  Psychomotor Activity:  Normal  Concentration:  Concentration: Good and Attention Span: Good  Recall:  Good  Fund of Knowledge: Good  Language: Good  Akathisia:  No  Handed:  Right  AIMS (if indicated): not done  Assets:   Communication Skills Desire for Improvement  ADL's:  Intact  Cognition: WNL  Sleep:  {BHH GOOD/FAIR/POOR:22877}   Screenings: GAD-7     Office Visit from 07/22/2017 in [facility]  Total GAD-7 Score  3    PHQ2-9     Office Visit from 01/24/2018 in [facility] Office Visit from [facility]/22/2017 in [facility] Office Visit from 01/18/2017 in [facility]  Office Visit from 10/04/2016 in [facility] Office Visit from 06/22/2016 in [facility]  PHQ-2 Total Score  0  2  0  0  0  PHQ-9 Total Score  4  5  -  -  -       Assessment and Plan:  JOZLYN SCHATZ is a 53 y.o. year old female with a history of depression, hypertension,  dilated cardiomyopathy,adrenal adenoma (undergoing evaluation),bilateral pulmonary embolism, obesity, obstructive sleep apnea on CPAP, history of toxic multinodular goiter , who presents for follow up appointment for No diagnosis found.  # MDD, moderate, recurrent without psychotic features  Patient denies significant mood symptoms since the last visit.  Psychosocial stressors including her husband who is currently in jail.  Will continue sertraline to target depression.  Noted that although she did have weight gain, it is more likely due to her change in lifestyle, rather than medication.  Discussed self compassion.  Discussed behavioral activation.   Plan  1.Continuesertraline 75 mg daily  2.Return to clinic inthee months for 15 mins  The patient demonstrates the following risk factors for suicide: Chronic risk factors for suicide include: psychiatric disorder of depression. 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 Protective factorsfor this patient include: responsibility to others (children, family) and coping skills. Considering these factors, the overall suicide risk at this point appears to be low. Patient  isappropriate for outpatient follow up.  [name], MD 01/28/2018, 10:01 AM

## 2018-01-30 ENCOUNTER — Ambulatory Visit (HOSPITAL_COMMUNITY): Payer: Self-pay | Admitting: Psychiatry

## 2018-02-05 NOTE — Progress Notes (Deleted)
[place] MD/PA/NP OP Progress Note  02/05/2018 12:45 PM Madeline Dominguez  MRN:  [id number]  Chief Complaint:  HPI: *** Visit Diagnosis: No diagnosis found.  Past Psychiatric History: Please see initial evaluation for full details. I have reviewed the history. No updates at this time.     Past Medical History:  Past Medical History:  Diagnosis Date  . Adrenal adenoma    a. normal cortisol and aldosterone levels  . Cough 07/25/2015  . Dilated cardiomyopathy ([place])    a. TTE 2/15: EF of 55-60%, dilated LV & LA, and nl RV; b. TTE 6/16:EF 55-60%, no RWMA, nl LV dias fxn, LV cavity size nl with mild concentric LVH, mildly dilated ascending aorta, LA nl size, RV sys fxn nl, PASP nl; c. TTE 7/17: EF 45-50%, unable to exclude RWMA, nl LV dias faxn, mildly dilated RV w/ nl sys fxn, PASP nl; d. TTE 7/18: EF 45-50%, mild LVH, Gr1DD, mildly dilated LA, mildly dilated RV  . Goiter    a. s/p radioactive iodine treatment in 2010  . Heart palpitations   . Hypertension   . Iron deficiency   . Large breasts   . Morbid obesity ([place])   . Postpartum hemorrhage   . Pulmonary embolism ([place]) 07/2015   a. s/p Eliquis; b. hypercoag work up neg; c. felt to be 2/2 OCP    Past Surgical History:  Procedure Laterality Date  . CESAREAN SECTION     26 years ago - repaired bleeding after vaginal birth    Family Psychiatric History: Please see initial evaluation for full details. I have reviewed the history. No updates at this time.     Family History:  Family History  Problem Relation Age of Onset  . Hypertension Mother   . CVA Father   . Deep vein thrombosis Brother   . Pulmonary embolism Brother   . Anemia Sister     Social History:  Social History   Socioeconomic History  . Marital status: Married    Spouse name: Not on file  . Number of children: 3  . Years of education: Not on file  . Highest education level: High school graduate  Occupational History  . Not on file  Social Needs  .  Financial resource strain: Not very hard  . Food insecurity:    Worry: Never true    Inability: Never true  . Transportation needs:    Medical: No    Non-medical: No  Tobacco Use  . Smoking status: Never Smoker  . Smokeless tobacco: Never Used  Substance and Sexual Activity  . Alcohol use: No    Alcohol/week: 0.0 standard drinks    Comment: rarely  . Drug use: No  . Sexual activity: Not Currently    Partners: Male    Birth control/protection: Condom  Lifestyle  . Physical activity:    Days per week: 3 days    Minutes per session: 20 min  . Stress: To some extent  Relationships  . Social connections:    Talks on phone: More than three times a week    Gets together: Twice a week    Attends religious service: 1 to 4 times per year    Active member of club or organization: No    Attends meetings of clubs or organizations: Never    Relationship status: Married  Other Topics Concern  . Not on file  Social History Narrative   Lives with 3 children, husband was incarcerated April 2018 - for stealing,  she has not been keeping in touch with him because she is disappointed. 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     Allergies:  Allergies  Allergen Reactions  . Aspirin Nausea And Vomiting  . Hctz [Hydrochlorothiazide] Other (See Comments)    Nausea, cramping    Metabolic Disorder Labs: Lab Results  Component Value Date   HGBA1C 5.0 01/24/2018   MPG 97 01/24/2018   MPG 97 01/18/2017   No results found for: PROLACTIN Lab Results  Component Value Date   CHOL 211 (H) 01/24/2018   TRIG 47 01/24/2018   HDL 73 01/24/2018   CHOLHDL 2.9 01/24/2018   VLDL 11 04/27/2016   LDLCALC 124 (H) 01/24/2018   LDLCALC 102 (H) 01/18/2017   Lab Results  Component Value Date   TSH 4.14 01/24/2018   TSH 4.22 01/18/2017    Therapeutic Level Labs: No results found for: LITHIUM No results found for: VALPROATE No components found for:   CBMZ  Current Medications: Current Outpatient Medications  Medication Sig Dispense Refill  . amLODipine (NORVASC) 5 MG tablet Take 1 tablet (5 mg total) by mouth daily. 90 tablet 1  . ferrous sulfate 325 (65 FE) MG tablet Take 1 tablet (325 mg total) by mouth 2 (two) times daily with a meal. 180 tablet 1  . lisinopril (PRINIVIL,ZESTRIL) 20 MG tablet Take 1 tablet (20 mg total) by mouth daily. 90 tablet 1  . meloxicam (MOBIC) 15 MG tablet Take 15 mg by mouth daily.    Marland Kitchen nystatin-triamcinolone ointment (MYCOLOG) Apply 1 application topically 2 (two) times daily. 30 g 0  . omeprazole (PRILOSEC) 40 MG capsule Take 1 capsule (40 mg total) by mouth daily. (Patient taking differently: Take 40 mg by mouth daily as needed. ) 30 capsule 3  . sertraline (ZOLOFT) 50 MG tablet Take 1.5 tablets (75 mg total) by mouth daily. 135 tablet 0  . vitamin B-12 1000 MCG tablet Take 1 tablet (1,000 mcg total) by mouth daily. 30 tablet 0   No current facility-administered medications for this visit.      Musculoskeletal: Strength & Muscle Tone: within normal limits Gait & Station: normal Patient leans: N/A  Psychiatric Specialty Exam: ROS  There were no vitals taken for this visit.There is no height or weight on file to calculate BMI.  General Appearance: Fairly Groomed  Eye Contact:  Good  Speech:  Clear and Coherent  Volume:  Normal  Mood:  {BHH MOOD:22306}  Affect:  {Affect (PAA):22687}  Thought Process:  Coherent  Orientation:  Full (Time, Place, and Person)  Thought Content: Logical   Suicidal Thoughts:  {ST/HT (PAA):22692}  Homicidal Thoughts:  {ST/HT (PAA):22692}  Memory:  Immediate;   Good  Judgement:  {Judgement (PAA):22694}  Insight:  {Insight (PAA):22695}  Psychomotor Activity:  Normal  Concentration:  Concentration: Good and Attention Span: Good  Recall:  Good  Fund of Knowledge: Good  Language: Good  Akathisia:  No  Handed:  Right  AIMS (if indicated): not done  Assets:   Communication Skills Desire for Improvement  ADL's:  Intact  Cognition: WNL  Sleep:  {BHH GOOD/FAIR/POOR:22877}   Screenings: GAD-7     Office Visit from 07/22/2017 in [facility]  Total GAD-7 Score  3    PHQ2-9     [facility] Visit from 01/24/2018 in [facility] Office Visit from 07/22/2017 in [facility] Office Visit from 01/18/2017 in [facility]  Office Visit from 10/04/2016 in [facility] Office Visit from 06/22/2016 in [facility]  PHQ-2 Total Score  0  2  0  0  0  PHQ-9 Total Score  4  5  -  -  -       Assessment and Plan:  Madeline Dominguez is a 53 y.o. year old female with a history of depression, hypertension, dilated cardiomyopathy,adrenal adenoma (undergoing evaluation),bilateral pulmonary embolism, obesity, obstructive sleep apnea on CPAP, history of toxic multinodular goiter , who presents for follow up appointment for No diagnosis found.  # MDD, moderate, recurrent without psychotic features  Patient denies significant mood symptoms since the last visit.  Psychosocial stressors including her husband who is currently in jail.  Will continue sertraline to target depression.  Noted that although she did have weight gain, it is more likely due to her change in lifestyle, rather than medication.  Discussed self compassion.  Discussed behavioral activation.   Plan  1.Continuesertraline 75 mg daily  2.Return to clinic inthee months for 15 mins  The patient demonstrates the following risk factors for suicide: Chronic risk factors for suicide include: psychiatric disorder of depression. 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 Protective factorsfor this patient include: responsibility to others (children, family) and coping skills. Considering these factors, the overall suicide risk at this point appears to be low. Patient  isappropriate for outpatient follow up.   [name], MD 02/05/2018, 12:45 PM

## 2018-02-11 ENCOUNTER — Ambulatory Visit (HOSPITAL_COMMUNITY): Payer: 59 | Admitting: Psychiatry

## 2018-03-24 ENCOUNTER — Other Ambulatory Visit (HOSPITAL_COMMUNITY): Payer: Self-pay | Admitting: Psychiatry

## 2018-03-24 DIAGNOSIS — F321 Major depressive disorder, single episode, moderate: Secondary | ICD-10-CM

## 2018-03-25 ENCOUNTER — Telehealth: Payer: Self-pay | Admitting: Family Medicine

## 2018-03-25 DIAGNOSIS — F321 Major depressive disorder, single episode, moderate: Secondary | ICD-10-CM

## 2018-03-25 MED ORDER — SERTRALINE HCL 50 MG PO TABS: 75.0000 mg | ORAL_TABLET | Freq: Every day | ORAL | 0 refills | Status: DC

## 2018-03-25 MED ORDER — AMLODIPINE BESYLATE 5 MG PO TABS: 5.0000 mg | ORAL_TABLET | Freq: Every day | ORAL | 0 refills | Status: DC

## 2018-03-25 NOTE — Telephone Encounter (Signed)
This is pending in your basket already.

## 2018-03-25 NOTE — Telephone Encounter (Signed)
Patient called requesting sertraline and amlodipine refills to [facility].

## 2018-03-25 NOTE — Addendum Note (Signed)
Addended by: Steele Sizer F on: 03/25/2018 04:26 PM   Modules accepted: Orders

## 2018-04-01 ENCOUNTER — Telehealth: Payer: Self-pay | Admitting: Family Medicine

## 2018-04-01 ENCOUNTER — Encounter: Payer: Self-pay | Admitting: Family Medicine

## 2018-04-01 NOTE — Telephone Encounter (Signed)
Pt is scheduled °

## 2018-04-01 NOTE — Telephone Encounter (Signed)
Copied from [place] [phone]. Topic: General - Other >> Apr 01, 2018  2:54 PM Keene Breath wrote: Reason for CRM: Patient called to request that the doctor or nurse call in a script for her sinus infection.  Patient stated that her ear is starting to hurt as well and she does not want it to get any worse.  Asked patient if she would be interested in a virtual visit or webex and she stated that would be fine.  Please call patient back at [phone]

## 2018-04-02 ENCOUNTER — Ambulatory Visit (INDEPENDENT_AMBULATORY_CARE_PROVIDER_SITE_OTHER): Payer: Medicaid Other | Admitting: Family Medicine

## 2018-04-02 DIAGNOSIS — M26622 Arthralgia of left temporomandibular joint: Secondary | ICD-10-CM | POA: Diagnosis not present

## 2018-04-02 DIAGNOSIS — H9202 Otalgia, left ear: Secondary | ICD-10-CM

## 2018-04-02 DIAGNOSIS — J302 Other seasonal allergic rhinitis: Secondary | ICD-10-CM

## 2018-04-02 MED ORDER — FLUTICASONE PROPIONATE 50 MCG/ACT NA SUSP: 2.0000 | Freq: Every day | NASAL | 2 refills | Status: DC

## 2018-04-02 MED ORDER — NAPROXEN 500 MG PO TABS: 500.0000 mg | ORAL_TABLET | Freq: Two times a day (BID) | ORAL | 0 refills | Status: DC

## 2018-04-02 MED ORDER — LORATADINE 10 MG PO TABS: 10.0000 mg | ORAL_TABLET | Freq: Every day | ORAL | 2 refills | Status: DC

## 2018-04-02 NOTE — Progress Notes (Signed)
Name: Madeline Dominguez   MRN: [id number]    DOB: 1965/05/05   Date:04/02/2018       Progress Note  Subjective  Chief Complaint  Chief Complaint  Patient presents with  . Sinusitis    x 2 weeks since her dogwood trees bloomed in her front yard.  . Otitis Media    left ear soreness and tenderness    I connected with@ on 04/02/18 at  9:40 AM EDT by a video enabled telemedicine application and verified that I am speaking with the correct person using two identifiers.  I discussed the limitations of evaluation and management by telemedicine and the availability of in person appointments. The patient expressed understanding and agreed to proceed. Staff also discussed with the patient that there may be a patient responsible charge related to this service. Patient Location: at home Provider Location: [facility]   HPI  AR: for the past two weeks she has noticed itchy nose, clearing her throat, some post-nasal drainage and frontal pressure. No fever or chills. She has not been taking any medications for it  Left ear pain: she states pain is in front of the left ear, no redness or swelling, dull aching, worse when chewing, no hearing loss.   Patient Active Problem List   Diagnosis Date Noted  . Stress incontinence in female 03/25/2017  . History of deep venous thrombosis (DVT) of distal vein of right lower extremity 01/18/2017  . Adrenal adenoma, left 09/11/2016  . Current moderate episode of major depressive disorder without prior episode ([place]) 08/10/2016  . 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 History of radioactive iodine thyroid ablation   . 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 Iron deficiency anemia 07/27/2015  . B12 deficiency 07/27/2015  . History of pulmonary embolus (PE) 07/25/2015  . Adrenal nodule ([place]) 07/25/2015  . Pulmonary nodules/lesions, multiple 07/25/2015  . OSA (obstructive sleep apnea) 05/18/2015   . Thyroid nodule 12/17/2014  . Heavy menstrual period 09/02/2014  . Gastroesophageal reflux disease without esophagitis 09/02/2014  . Morbid obesity, unspecified obesity type ([place]) 06/22/2014  . Large breasts 06/22/2014  . Anemia, iron deficiency 06/22/2014  . H/O hyperthyroidism 06/22/2014  . Right ventricular dilation 06/22/2014  . 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    Past Surgical History:  Procedure Laterality Date  . CESAREAN SECTION     26 years ago - repaired bleeding after vaginal birth    Family History  Problem Relation Age of Onset  . Hypertension Mother   . CVA Father   . Deep vein thrombosis Brother   . Pulmonary embolism Brother   . Anemia Sister     Social History   Socioeconomic History  . Marital status: Married    Spouse name: Not on file  . Number of children: 3  . Years of education: Not on file  . Highest education level: High school graduate  Occupational History  . Not on file  Social Needs  . Financial resource strain: Not very hard  . Food insecurity:    Worry: Never true    Inability: Never true  . Transportation needs:    Medical: No    Non-medical: No  Tobacco Use  . Smoking status: Never Smoker  . Smokeless tobacco: Never Used  Substance and Sexual Activity  . Alcohol use: No    Alcohol/week: 0.0 standard drinks    Comment: rarely  . Drug use: No  .  Sexual activity: Not Currently    Partners: Male    Birth control/protection: Condom  Lifestyle  . Physical activity:    Days per week: 3 days    Minutes per session: 20 min  . Stress: To some extent  Relationships  . Social connections:    Talks on phone: More than three times a week    Gets together: Twice a week    Attends religious service: 1 to 4 times per year    Active member of club or organization: No    Attends meetings of clubs or organizations: Never    Relationship status: Married  . Intimate partner  violence:    Fear of current or ex partner: No    Emotionally abused: No    Physically abused: No    Forced sexual activity: No  Other Topics Concern  . Not on file  Social History Narrative   Lives with 3 children, husband was incarcerated April 2018 - for stealing, she has not been keeping in touch with him because she is disappointed. 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      Current Outpatient Medications:  .  amLODipine (NORVASC) 5 MG tablet, Take 1 tablet (5 mg total) by mouth daily., Disp: 90 tablet, Rfl: 0 .  ferrous sulfate 325 (65 FE) MG tablet, Take 1 tablet (325 mg total) by mouth 2 (two) times daily with a meal., Disp: 180 tablet, Rfl: 1 .  lisinopril (PRINIVIL,ZESTRIL) 20 MG tablet, Take 1 tablet (20 mg total) by mouth daily., Disp: 90 tablet, Rfl: 1 .  meloxicam (MOBIC) 15 MG tablet, Take 15 mg by mouth daily., Disp: , Rfl:  .  nystatin-triamcinolone ointment (MYCOLOG), Apply 1 application topically 2 (two) times daily., Disp: 30 g, Rfl: 0 .  omeprazole (PRILOSEC) 40 MG capsule, Take 1 capsule (40 mg total) by mouth daily. (Patient taking differently: Take 40 mg by mouth daily as needed. ), Disp: 30 capsule, Rfl: 3 .  sertraline (ZOLOFT) 50 MG tablet, Take 1.5 tablets (75 mg total) by mouth daily., Disp: 135 tablet, Rfl: 0 .  vitamin B-12 1000 MCG tablet, Take 1 tablet (1,000 mcg total) by mouth daily., Disp: 30 tablet, Rfl: 0  Allergies  Allergen Reactions  . Aspirin Nausea And Vomiting  . Hctz [Hydrochlorothiazide] Other (See Comments)    Nausea, cramping    I personally reviewed active problem list, medication list, allergies, family history, social history with the patient/caregiver today.   ROS  Ten systems reviewed and is negative except as mentioned in HPI   Objective  Virtual encounter, vitals not obtained.   Physical Exam  Awake, alert in no acute distress Pain over left TMJ area, no redness, pain when  opening jaw Pain during palpation of frontal sinus   PHQ2/9: Depression screen [facility] 2/9 04/01/2018 01/24/2018 07/22/2017 01/18/2017 10/04/2016  Decreased Interest 0 0 1 0 0  Down, Depressed, Hopeless 0 0 1 0 0  PHQ - 2 Score 0 0 2 0 0  Altered sleeping 0 3 0 - -  Tired, decreased energy 0 1 1 - -  Change in appetite 0 0 1 - -  Feeling bad or failure about yourself  0 0 1 - -  Trouble concentrating 0 0 0 - -  Moving slowly or fidgety/restless 0 0 0 - -  Suicidal thoughts 0 0 0 - -  PHQ-9 Score 0 4 5 - -  Difficult doing work/chores -  Not difficult at all Not difficult at all - -   PHQ-2/9 Result is negative.    Fall Risk: Fall Risk  04/01/2018 01/24/2018 07/22/2017 01/18/2017 10/04/2016  Falls in the past year? 0 0 No No No  Number falls in past yr: 0 0 - - -  Injury with Fall? 0 0 - - -     Assessment & Plan  1. Acute otalgia, left  - naproxen (NAPROSYN) 500 MG tablet; Take 1 tablet (500 mg total) by mouth 2 (two) times daily with a meal.  Dispense: 30 tablet; Refill: 0  2. Seasonal allergies  - loratadine (CLARITIN) 10 MG tablet; Take 1 tablet (10 mg total) by mouth daily.  Dispense: 30 tablet; Refill: 2 - fluticasone (FLONASE) 50 MCG/ACT nasal spray; Place 2 sprays into both nostrils daily.  Dispense: 16 g; Refill: 2  3. TMJ tenderness, left  - naproxen (NAPROSYN) 500 MG tablet; Take 1 tablet (500 mg total) by mouth 2 (two) times daily with a meal.  Dispense: 30 tablet; Refill: 0  Hold meloxicam while taking naproxen and also may add tylenol. Explained meloxicam and naproxen are the same class of drugs   I discussed the assessment and treatment plan with the patient. The patient was provided an opportunity to ask questions and all were answered. The patient agreed with the plan and demonstrated an understanding of the instructions.  The patient was advised to call back or seek an in-person evaluation if the symptoms worsen or if the condition fails to improve as anticipated.   I provided 15 minutes of non-face-to-face time during this encounter.

## 2018-04-25 ENCOUNTER — Other Ambulatory Visit: Payer: Self-pay

## 2018-04-25 ENCOUNTER — Ambulatory Visit (INDEPENDENT_AMBULATORY_CARE_PROVIDER_SITE_OTHER): Payer: Medicaid Other | Admitting: Family Medicine

## 2018-04-25 ENCOUNTER — Encounter: Payer: Self-pay | Admitting: Family Medicine

## 2018-04-25 DIAGNOSIS — Z923 Personal history of irradiation: Secondary | ICD-10-CM | POA: Diagnosis not present

## 2018-04-25 DIAGNOSIS — E279 Disorder of adrenal gland, unspecified: Secondary | ICD-10-CM | POA: Diagnosis not present

## 2018-04-25 DIAGNOSIS — E278 Other specified disorders of adrenal gland: Secondary | ICD-10-CM

## 2018-04-25 DIAGNOSIS — K219 Gastro-esophageal reflux disease without esophagitis: Secondary | ICD-10-CM | POA: Diagnosis not present

## 2018-04-25 DIAGNOSIS — F325 Major depressive disorder, single episode, in full remission: Secondary | ICD-10-CM

## 2018-04-25 DIAGNOSIS — E041 Nontoxic single thyroid nodule: Secondary | ICD-10-CM | POA: Diagnosis not present

## 2018-04-25 DIAGNOSIS — M62838 Other muscle spasm: Secondary | ICD-10-CM

## 2018-04-25 DIAGNOSIS — E538 Deficiency of other specified B group vitamins: Secondary | ICD-10-CM

## 2018-04-25 DIAGNOSIS — M170 Bilateral primary osteoarthritis of knee: Secondary | ICD-10-CM

## 2018-04-25 DIAGNOSIS — I422 Other hypertrophic cardiomyopathy: Secondary | ICD-10-CM

## 2018-04-25 DIAGNOSIS — E0590 Thyrotoxicosis, unspecified without thyrotoxic crisis or storm: Secondary | ICD-10-CM | POA: Diagnosis not present

## 2018-04-25 DIAGNOSIS — M26622 Arthralgia of left temporomandibular joint: Secondary | ICD-10-CM

## 2018-04-25 DIAGNOSIS — I10 Essential (primary) hypertension: Secondary | ICD-10-CM | POA: Diagnosis not present

## 2018-04-25 MED ORDER — LISINOPRIL 20 MG PO TABS: 20.0000 mg | ORAL_TABLET | Freq: Every day | ORAL | 0 refills | Status: DC

## 2018-04-25 MED ORDER — SERTRALINE HCL 50 MG PO TABS: 75.0000 mg | ORAL_TABLET | Freq: Every day | ORAL | 0 refills | Status: DC

## 2018-04-25 MED ORDER — AMLODIPINE BESYLATE 5 MG PO TABS: 5.0000 mg | ORAL_TABLET | Freq: Every day | ORAL | 0 refills | Status: DC

## 2018-04-25 MED ORDER — MELOXICAM 15 MG PO TABS: 15.0000 mg | ORAL_TABLET | Freq: Every day | ORAL | 0 refills | Status: DC

## 2018-04-25 MED ORDER — BACLOFEN 10 MG PO TABS: 10.0000 mg | ORAL_TABLET | Freq: Three times a day (TID) | ORAL | 0 refills | Status: DC | PRN

## 2018-04-25 NOTE — Progress Notes (Signed)
Name: Madeline Dominguez   MRN: [id number]    DOB: 01-29-1965   Date:04/25/2018       Progress Note  Subjective  Chief Complaint  No chief complaint on file.   I connected with  Madeline Dominguez  on 04/25/18 at 11:20 AM EDT by a video enabled telemedicine application and verified that I am speaking with the correct person using two identifiers.  I discussed the limitations of evaluation and management by telemedicine and the availability of in person appointments. The patient expressed understanding and agreed to proceed. Staff also discussed with the patient that there may be a patient responsible charge related to this service. Patient Location: at home  Provider Location: [facility]  HPI  Cardiomyopathy: denies orthopnea or decrease in exercise tolerance, on lisinopril. No chest pain, denies orthopnea , lower extremity edema  or palpitation. Secondary to hyperthyroidism and uncontrolled HTN, bp has been controlled lately.   HTN: bp is at goal, used to see nephrologist , however doing well now, on Norvasc 5 and lisinopril 20 mg, no chest pain . Unable to check bp at home, out of batteries   Depression: severe episode in 2018 with anhedonia, lack of appetite, she had pain all over, unable to work for months. She was started on Zoloft she also had some therapy and is in remission now. Continue medication   Adrenal nodule left: no symptoms, many tests done, last visit was at [facility] and is doing well   Goiter: no dysphagia, ;ast visit with Endo was Fall 2018, advised to monitor TSH, doing well, last level normal   Obesity:discussed life style modification again, she had gained 16 lbs before her last visit, unable to check her weight today, reminded her of healthy life style   GERD: taking medication prn only and is doing well. Stable   Anemia: resolved, last labs reviewed with patient. B12 also back to normal She is due for colonoscopy  TMJ: left side, doing  better, took Naproxen and is avoiding opening jaw or chewing too much    Muscle Spasms: very seldom now but would like to resume muscle relaxer   Patient Active Problem List   Diagnosis Date Noted  . Stress incontinence in female 03/25/2017  . History of deep venous thrombosis (DVT) of distal vein of right lower extremity 01/18/2017  . Adrenal adenoma, left 09/11/2016  . Current moderate episode of major depressive disorder without prior episode ([place]) 08/10/2016  . 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 History of radioactive iodine thyroid ablation   . 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 Iron deficiency anemia 07/27/2015  . B12 deficiency 07/27/2015  . History of pulmonary embolus (PE) 07/25/2015  . Adrenal nodule ([place]) 07/25/2015  . Pulmonary nodules/lesions, multiple 07/25/2015  . OSA (obstructive sleep apnea) 05/18/2015  . Thyroid nodule 12/17/2014  . Heavy menstrual period 09/02/2014  . Gastroesophageal reflux disease without esophagitis 09/02/2014  . Morbid obesity, unspecified obesity type ([place]) 06/22/2014  . Large breasts 06/22/2014  . Anemia, iron deficiency 06/22/2014  . H/O hyperthyroidism 06/22/2014  . Right ventricular dilation 06/22/2014  . 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    Past Surgical History:  Procedure Laterality Date  . CESAREAN SECTION     26 years ago - repaired bleeding after vaginal birth    Family History  Problem Relation Age of Onset  . Hypertension Mother   . CVA Father   .  Deep vein thrombosis Brother   . Pulmonary embolism Brother   . Anemia Sister     Social History   Socioeconomic History  . Marital status: Married    Spouse name: Not on file  . Number of children: 3  . Years of education: Not on file  . Highest education level: High school graduate  Occupational History  . Not on file  Social Needs  . Financial  resource strain: Not very hard  . Food insecurity:    Worry: Never true    Inability: Never true  . Transportation needs:    Medical: No    Non-medical: No  Tobacco Use  . Smoking status: Never Smoker  . Smokeless tobacco: Never Used  Substance and Sexual Activity  . Alcohol use: No    Alcohol/week: 0.0 standard drinks    Comment: rarely  . Drug use: No  . Sexual activity: Not Currently    Partners: Male    Birth control/protection: Condom  Lifestyle  . Physical activity:    Days per week: 3 days    Minutes per session: 20 min  . Stress: To some extent  Relationships  . Social connections:    Talks on phone: More than three times a week    Gets together: Twice a week    Attends religious service: 1 to 4 times per year    Active member of club or organization: No    Attends meetings of clubs or organizations: Never    Relationship status: Married  . Intimate partner violence:    Fear of current or ex partner: No    Emotionally abused: No    Physically abused: No    Forced sexual activity: No  Other Topics Concern  . Not on file  Social History Narrative   Lives with 3 children, husband was incarcerated April 2018 - for stealing, she has not been keeping in touch with him because she is disappointed. 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      Current Outpatient Medications:  .  amLODipine (NORVASC) 5 MG tablet, Take 1 tablet (5 mg total) by mouth daily., Disp: 90 tablet, Rfl: 0 .  ferrous sulfate 325 (65 FE) MG tablet, Take 1 tablet (325 mg total) by mouth 2 (two) times daily with a meal., Disp: 180 tablet, Rfl: 1 .  fluticasone (FLONASE) 50 MCG/ACT nasal spray, Place 2 sprays into both nostrils daily., Disp: 16 g, Rfl: 2 .  lisinopril (PRINIVIL,ZESTRIL) 20 MG tablet, Take 1 tablet (20 mg total) by mouth daily., Disp: 90 tablet, Rfl: 1 .  loratadine (CLARITIN) 10 MG tablet, Take 1 tablet (10 mg total) by mouth daily., Disp: 30  tablet, Rfl: 2 .  meloxicam (MOBIC) 15 MG tablet, Take 15 mg by mouth daily., Disp: , Rfl:  .  naproxen (NAPROSYN) 500 MG tablet, Take 1 tablet (500 mg total) by mouth 2 (two) times daily with a meal., Disp: 30 tablet, Rfl: 0 .  nystatin-triamcinolone ointment (MYCOLOG), Apply 1 application topically 2 (two) times daily., Disp: 30 g, Rfl: 0 .  omeprazole (PRILOSEC) 40 MG capsule, Take 1 capsule (40 mg total) by mouth daily. (Patient taking differently: Take 40 mg by mouth daily as needed. ), Disp: 30 capsule, Rfl: 3 .  sertraline (ZOLOFT) 50 MG tablet, Take 1.5 tablets (75 mg total) by mouth daily., Disp: 135 tablet, Rfl: 0 .  vitamin B-12 1000 MCG tablet, Take 1 tablet (1,000 mcg  total) by mouth daily., Disp: 30 tablet, Rfl: 0  Allergies  Allergen Reactions  . Aspirin Nausea And Vomiting  . Hctz [Hydrochlorothiazide] Other (See Comments)    Nausea, cramping    I personally reviewed active problem list, medication list, allergies, family history with the patient/caregiver today.   ROS  Ten systems reviewed and is negative except as mentioned in HPI   Objective  Virtual encounter, vitals not obtained.   Physical Exam    Awake, alert and oriented in no distress and well groomed PHQ2/9: Depression screen [facility] 2/9 04/25/2018 04/01/2018 01/24/2018 07/22/2017 01/18/2017  Decreased Interest 0 0 0 1 0  Down, Depressed, Hopeless 0 0 0 1 0  PHQ - 2 Score 0 0 0 2 0  Altered sleeping 0 0 3 0 -  Tired, decreased energy 0 0 1 1 -  Change in appetite 0 0 0 1 -  Feeling bad or failure about yourself  0 0 0 1 -  Trouble concentrating 0 0 0 0 -  Moving slowly or fidgety/restless 0 0 0 0 -  Suicidal thoughts 0 0 0 0 -  PHQ-9 Score 0 0 4 5 -  Difficult doing work/chores - - Not difficult at all Not difficult at all -   PHQ-2/9 Result is negative.    Fall Risk: Fall Risk  04/25/2018 04/01/2018 01/24/2018 07/22/2017 01/18/2017  Falls in the past year? 0 0 0 No No  Number falls in past yr: 0 0 0 - -   Injury with Fall? 0 0 0 - -     Assessment & Plan  1. Hypertension, benign  - amLODipine (NORVASC) 5 MG tablet; Take 1 tablet (5 mg total) by mouth daily.  Dispense: 90 tablet; Refill: 0 - lisinopril (ZESTRIL) 20 MG tablet; Take 1 tablet (20 mg total) by mouth daily.  Dispense: 90 tablet; Refill: 1  2. Gastroesophageal reflux disease without esophagitis  Taking medication prn   3. TMJ tenderness, left  Stable   4. Major depression in remission (HCC)  - sertraline (ZOLOFT) 50 MG tablet; Take 1.5 tablets (75 mg total) by mouth daily.  Dispense: 135 tablet; Refill: 0  5. Adrenal nodule ([place])  Advised to contact [facility] and ask when due for follow up  6. Thyroid nodule   7. B12 deficiency   8. History of radioactive iodine thyroid ablation  Seen by endo in the past   9. Primary osteoarthritis of both knees  - meloxicam (MOBIC) 15 MG tablet; Take 1 tablet (15 mg total) by mouth daily.  10. 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 Morbid obesity, unspecified obesity type [facility])  Discussed with the patient the risk posed by an increased BMI. Discussed importance of portion control, calorie counting and at least 150 minutes of physical activity weekly. Avoid sweet beverages and drink more water. Eat at least 6 servings of fruit and vegetables daily    I discussed the assessment and treatment plan with the patient. The patient was provided an opportunity to ask questions and all were answered. The patient agreed with the plan and demonstrated an understanding of the instructions.  The patient was advised to call back or seek an in-person evaluation if the symptoms worsen or if the condition fails to improve as anticipated.  I provided 25 minutes of non-face-to-face time during this encounter.

## 2018-05-25 ENCOUNTER — Telehealth: Payer: Medicaid Other | Admitting: Family

## 2018-05-25 DIAGNOSIS — J020 Streptococcal pharyngitis: Secondary | ICD-10-CM

## 2018-05-25 MED ORDER — AMOXICILLIN-POT CLAVULANATE 875-125 MG PO TABS: 1.0000 | ORAL_TABLET | Freq: Two times a day (BID) | ORAL | 0 refills | Status: DC

## 2018-05-25 NOTE — Progress Notes (Signed)

## 2018-06-05 IMAGING — CR DG CHEST 2V
2 series · 2 of 2 positions shown · non-contrast
Comparison: None.

CLINICAL DATA: Shortness of breath for 1 day. History of
cardiomyopathy

EXAM:
CHEST  2 VIEW

[chest pa]
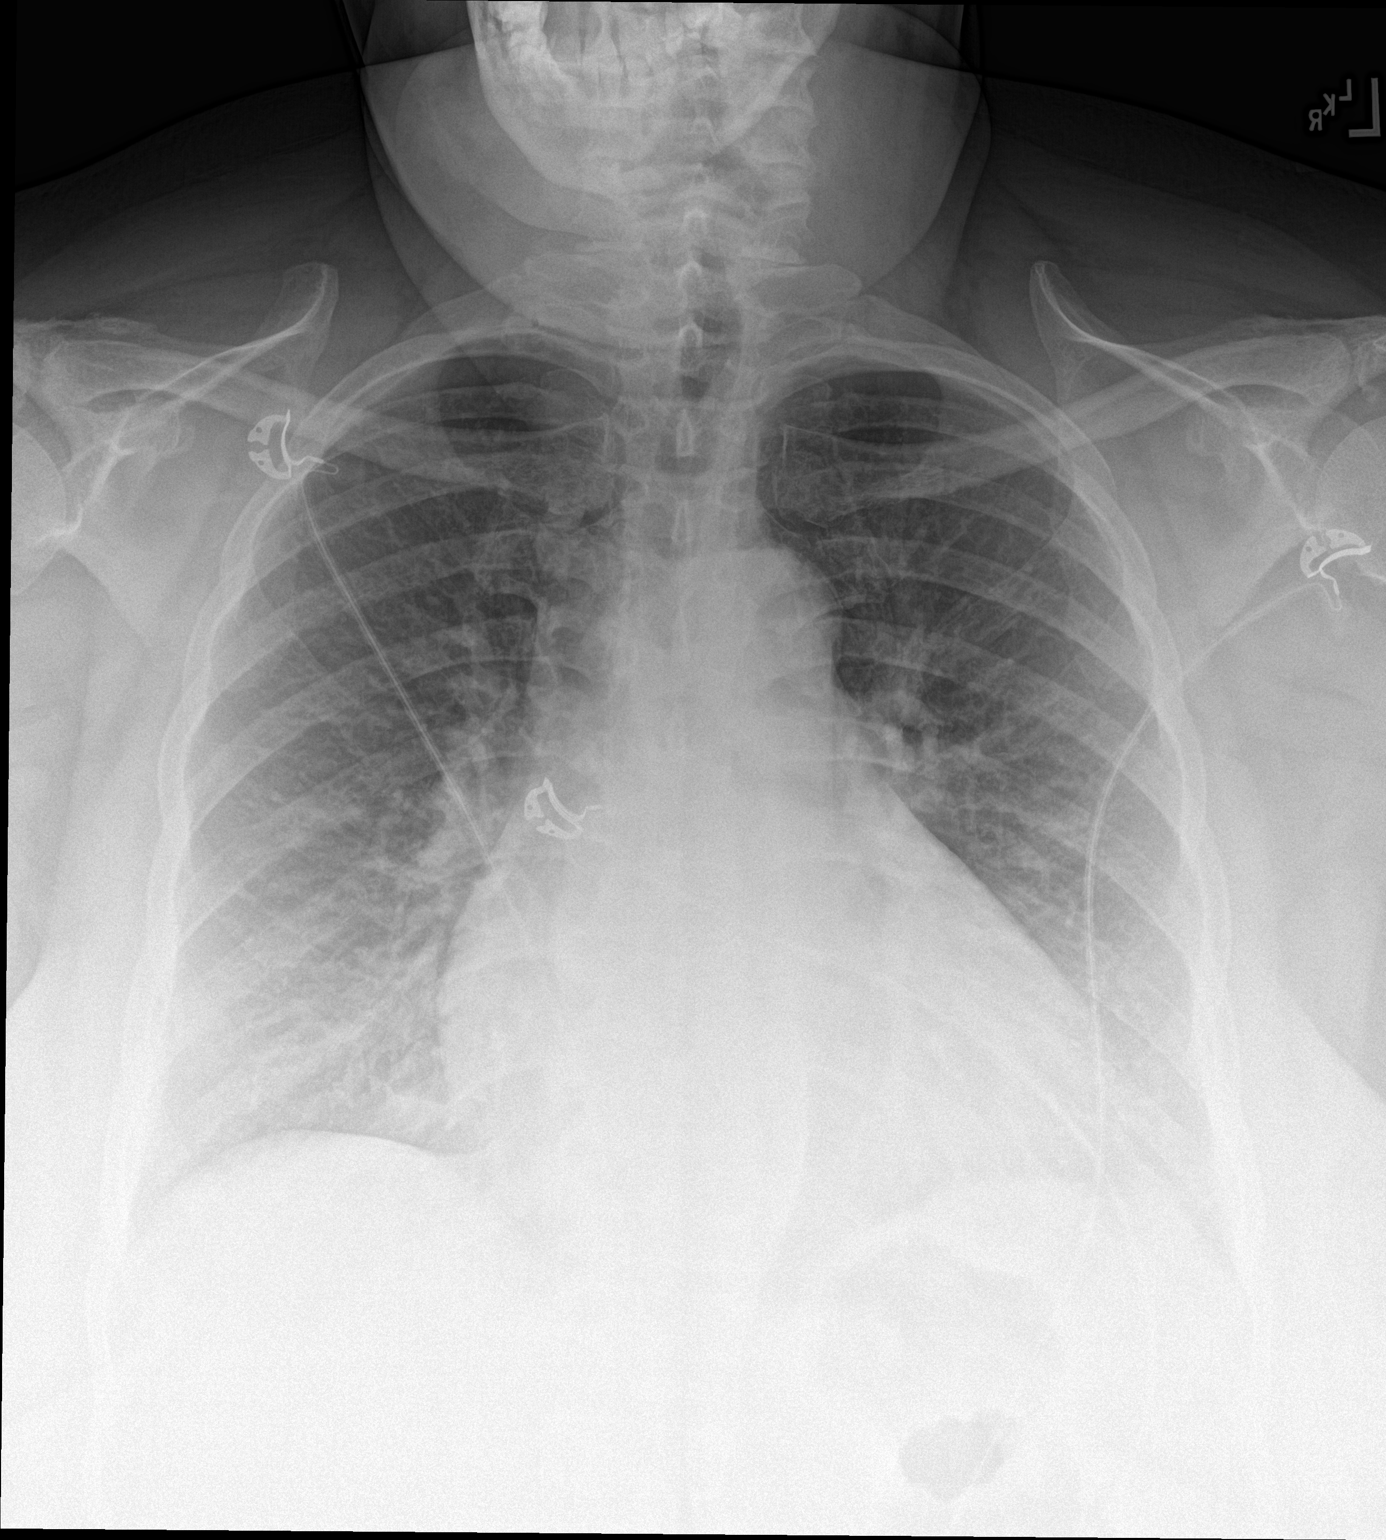

[chest lat]
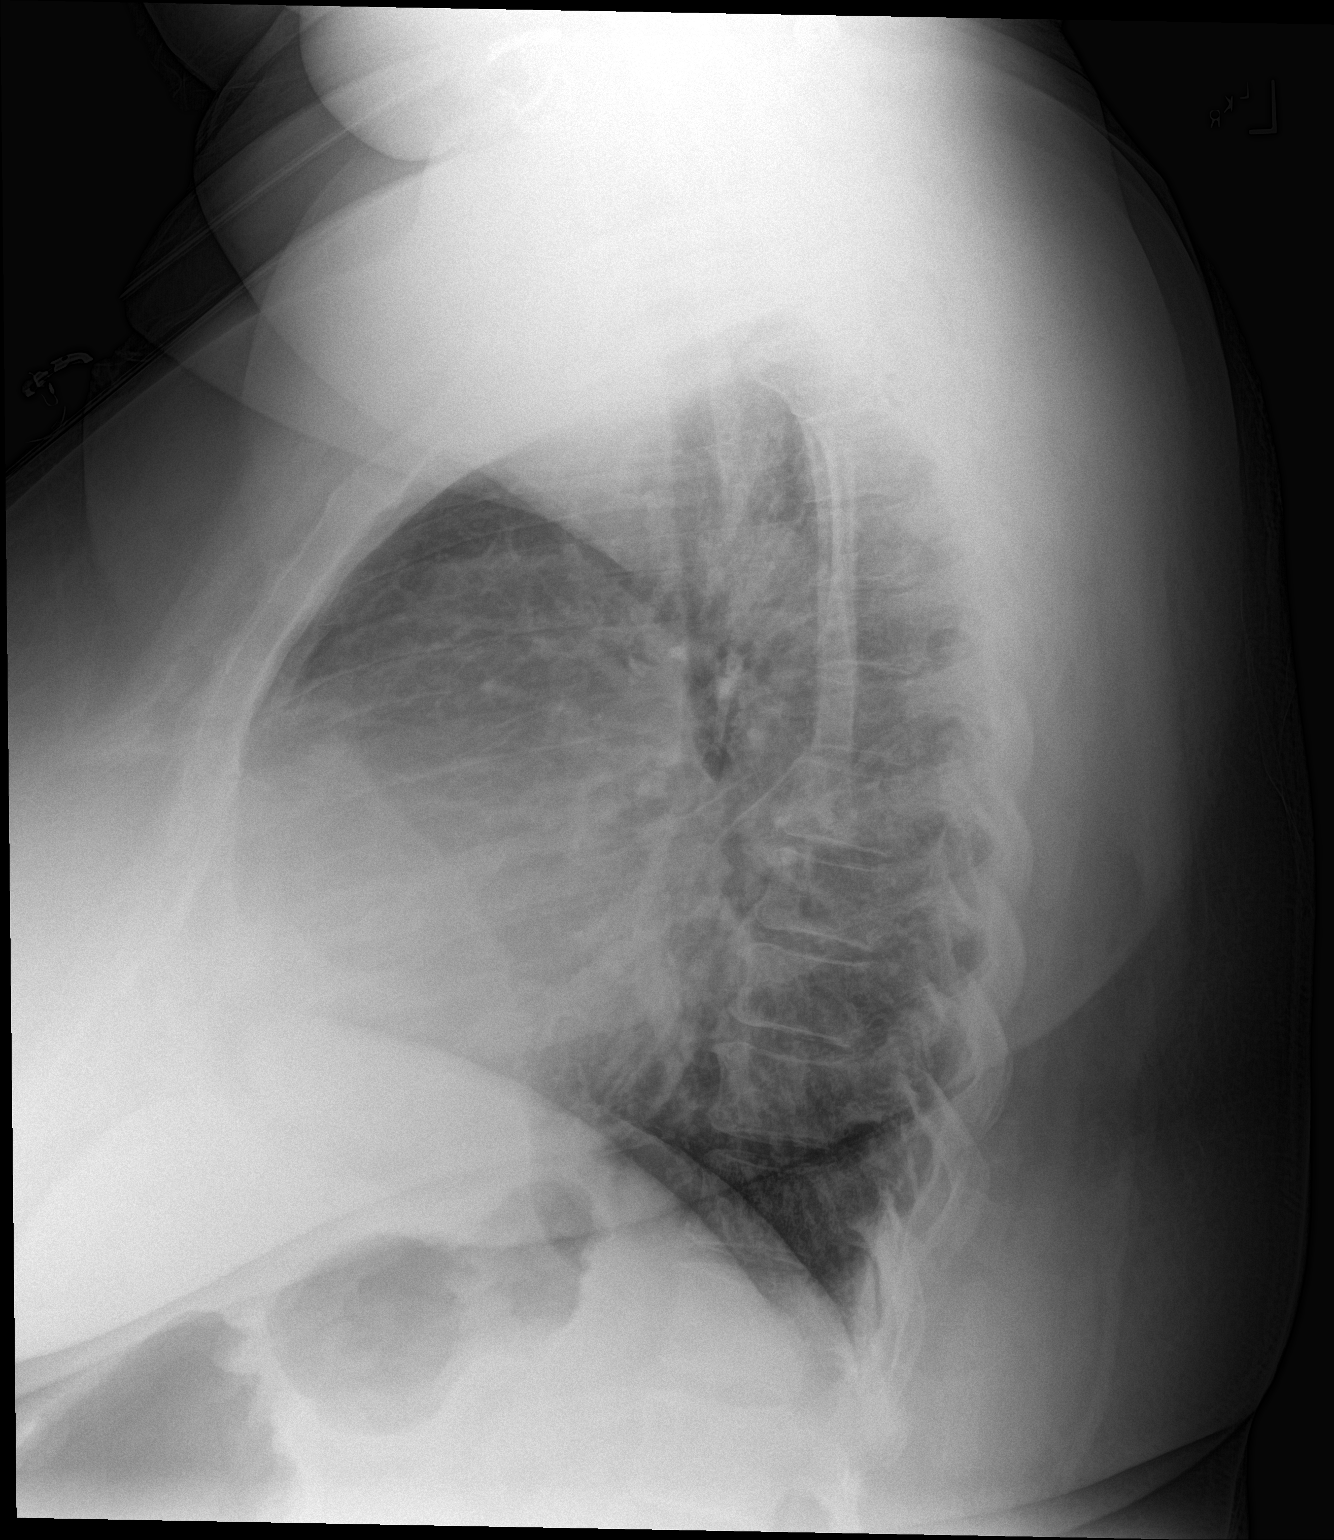

[2 of 2 positions shown; findings below may reference images not displayed]

FINDINGS: There is no edema or consolidation. Heart is enlarged with pulmonary
vascularity within normal limits. No adenopathy. No bone lesions.
IMPRESSION: Cardiomegaly.  No edema or consolidation.

## 2018-07-03 ENCOUNTER — Telehealth: Payer: Self-pay | Admitting: Family

## 2018-07-03 DIAGNOSIS — R300 Dysuria: Secondary | ICD-10-CM

## 2018-07-03 MED ORDER — SULFAMETHOXAZOLE-TRIMETHOPRIM 800-160 MG PO TABS: 1.0000 | ORAL_TABLET | Freq: Two times a day (BID) | ORAL | 0 refills | Status: DC

## 2018-07-03 NOTE — Progress Notes (Signed)
We are sorry that you are not feeling well.  Here is how we plan to help!  Based on what you shared with me it looks like you most likely have a simple urinary tract infection.  A UTI (Urinary Tract Infection) is a bacterial infection of the bladder.  Most cases of urinary tract infections are simple to treat but a key part of your care is to encourage you to drink plenty of fluids and watch your symptoms carefully.  I have prescribed Bactrim DS One tablet twice a day for 5 days.  Your symptoms should gradually improve. Call us if the burning in your urine worsens, you develop worsening fever, back pain or pelvic pain or if your symptoms do not resolve after completing the antibiotic.  Urinary tract infections can be prevented by drinking plenty of water to keep your body hydrated.  Also be sure when you wipe, wipe from front to back and don't hold it in!  If possible, empty your bladder every 4 hours.  Your e-visit answers were reviewed by a board certified advanced clinical practitioner to complete your personal care plan.  Depending on the condition, your plan could have included both over the counter or prescription medications.  If there is a problem please reply  once you have received a response from your provider.  Your safety is important to [place].  If you have drug allergies check your prescription carefully.    You can use MyChart to ask questions about today's visit, request a non-urgent call back, or ask for a work or school excuse for 24 hours related to this e-Visit. If it has been greater than 24 hours you will need to follow up with your provider, or enter a new e-Visit to address those concerns.   You will get an e-mail in the next two days asking about your experience.  I hope that your e-visit has been valuable and will speed your recovery. Thank you for using e-visits.  Greater than 5 minutes, yet less than 10 minutes of time have been spent researching, coordinating, and  implementing care for this patient today.  Thank you for the details you included in the comment boxes. Those details are very helpful in determining the best course of treatment for you and help [place] to provide the best care.  Greater than 5 minutes, yet less than 10 minutes of time have been spent researching, coordinating, and implementing care for this patient today.  Thank you for the details you included in the comment boxes. Those details are very helpful in determining the best course of treatment for you and help [place] to provide the best care.

## 2018-10-01 ENCOUNTER — Other Ambulatory Visit: Payer: Self-pay | Admitting: Family Medicine

## 2018-10-01 DIAGNOSIS — F325 Major depressive disorder, single episode, in full remission: Secondary | ICD-10-CM

## 2018-11-03 ENCOUNTER — Other Ambulatory Visit: Payer: Self-pay | Admitting: Family Medicine

## 2018-11-03 DIAGNOSIS — F325 Major depressive disorder, single episode, in full remission: Secondary | ICD-10-CM

## 2018-11-07 MED ORDER — SERTRALINE HCL 50 MG PO TABS: 50.0000 mg | ORAL_TABLET | Freq: Every day | ORAL | 0 refills | Status: DC

## 2018-11-07 NOTE — Addendum Note (Signed)
Addended by: Inda Coke on: 11/07/2018 01:46 PM   Modules accepted: Orders

## 2018-11-07 NOTE — Telephone Encounter (Signed)
Pt did not know why this Rx was denied. She took last one today.  advised pt Dr Ancil Boozer wanted her to follow up in 3 mos and she did not.   Can you send in 30 day to get her through to her appt.  sertraline (ZOLOFT) 50 MG tablet  [facility] [address]), [place] [phone] (Phone) [phone] (Fax)

## 2018-11-25 NOTE — Progress Notes (Signed)
Greater than 5 minutes, yet less than 10 minutes of time have been spent researching, coordinating, and implementing care for this patient today.  Thank you for the details you included in the comment boxes. Those details are very helpful in determining the best course of treatment for you and help us to provide the best care.  

## 2018-12-02 ENCOUNTER — Encounter: Payer: Self-pay | Admitting: Family Medicine

## 2018-12-02 ENCOUNTER — Other Ambulatory Visit: Payer: Self-pay

## 2018-12-02 ENCOUNTER — Ambulatory Visit (INDEPENDENT_AMBULATORY_CARE_PROVIDER_SITE_OTHER): Payer: Medicaid Other | Admitting: Family Medicine

## 2018-12-02 VITALS — BP 133/76 | HR 83 | Ht 67.0 in | Wt 365.0 lb

## 2018-12-02 DIAGNOSIS — F325 Major depressive disorder, single episode, in full remission: Secondary | ICD-10-CM | POA: Diagnosis not present

## 2018-12-02 DIAGNOSIS — D500 Iron deficiency anemia secondary to blood loss (chronic): Secondary | ICD-10-CM

## 2018-12-02 DIAGNOSIS — E278 Other specified disorders of adrenal gland: Secondary | ICD-10-CM

## 2018-12-02 DIAGNOSIS — M170 Bilateral primary osteoarthritis of knee: Secondary | ICD-10-CM

## 2018-12-02 DIAGNOSIS — Z1231 Encounter for screening mammogram for malignant neoplasm of breast: Secondary | ICD-10-CM | POA: Diagnosis not present

## 2018-12-02 DIAGNOSIS — Z1211 Encounter for screening for malignant neoplasm of colon: Secondary | ICD-10-CM

## 2018-12-02 DIAGNOSIS — I10 Essential (primary) hypertension: Secondary | ICD-10-CM

## 2018-12-02 DIAGNOSIS — M62838 Other muscle spasm: Secondary | ICD-10-CM | POA: Diagnosis not present

## 2018-12-02 DIAGNOSIS — E538 Deficiency of other specified B group vitamins: Secondary | ICD-10-CM | POA: Diagnosis not present

## 2018-12-02 MED ORDER — AMLODIPINE BESYLATE-VALSARTAN 5-160 MG PO TABS: 1.0000 | ORAL_TABLET | Freq: Every day | ORAL | 0 refills | Status: DC

## 2018-12-02 MED ORDER — BACLOFEN 10 MG PO TABS: 10.0000 mg | ORAL_TABLET | Freq: Three times a day (TID) | ORAL | 0 refills | Status: DC | PRN

## 2018-12-02 MED ORDER — SERTRALINE HCL 50 MG PO TABS: 50.0000 mg | ORAL_TABLET | Freq: Every day | ORAL | 1 refills | Status: DC

## 2018-12-02 NOTE — Progress Notes (Signed)
Name: Madeline Dominguez   MRN: [id number]    DOB: April 20, 1965   Date:12/02/2018       Progress Note  Subjective  Chief Complaint  Chief Complaint  Patient presents with  . Medication Refill  . Hypertension    Eating tomatoes will make her lips and also sometimes while taking the BP medication  . Depression  . Gastroesophageal Reflux    Well controlled   . Obesity  . Anemia    I connected with  [name]  on 12/02/18 at  3:20 PM EST by a video enabled telemedicine application and verified that I am speaking with the correct person using two identifiers.  I discussed the limitations of evaluation and management by telemedicine and the availability of in person appointments. The patient expressed understanding and agreed to proceed. Staff also discussed with the patient that there may be a patient responsible charge related to this service. Patient Location: at home  Provider Location: [facility]    HPI  Cardiomyopathy: denies orthopnea or decrease in exercise tolerance, on lisinopril. No chest pain, denies orthopnea , lower extremity edema  or palpitation. Secondary to hyperthyroidism and uncontrolled HTN, bp has been controlled lately. Unchanged  HTN:bp is at goal, used to see nephrologist ,on Norvasc 5 and lisinopril 20 mg, no chest pain . She has noticed episodes of angioedema ( after she eats tomatoes and used to have that as a child) we will try switching to [place], advised to stop eating tomatoes and see if angioedema resolves   Depression: severe episode in 2018 with anhedonia, lack of appetite, she had pain all over, unable to work for months. She was started on Zoloft she also had some therapy and is doing better. 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  Continue medication   Adrenal nodule left: no symptoms, many  tests done, last visit was at [place] , she told me that she will only need to go back on a prn basis  Goiter: no dysphagia, ;ast visit with Endo was Fall 2018, advised to monitor TSH, doing well, last level normal , explained she is due to have repeat labs, she will return for a CPE  Obesity:discussed life style modification again, she had gained 16 lbs before her last visit, unable to check her weight today, reminded her of healthy life style   GERD: taking medication prn only and is doing well.Stable   OA: taking meloxicam daily, she daily pain aching pain on both knees. Standing aggravates the pain , she states has intermittent effusion when more active  Anemia: resolved, last labs reviewed with patient. B12 also back to normal She is due for colonoscopy, she agrees to go now   Muscle Spasms: very seldom now but would like to have another refill of muscle relaxer  Angioedema: she states as a child she noticed that eating tomatoes caused it, she states recently she noticed some returned of angioedema a day after she eats tomatoes, however she also takes ACE and is worried it may be side effects. Advised to stop eating tomatoes   Patient Active Problem List   Diagnosis Date Noted  . Stress incontinence in female 03/25/2017  . History of deep venous thrombosis (DVT) of distal vein of right lower extremity 01/18/2017  . Adrenal adenoma, left 09/11/2016  .  Current moderate episode of major depressive disorder without prior episode ([place]) 08/10/2016  . 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 History of radioactive iodine thyroid ablation   . 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 Iron deficiency anemia 07/27/2015  . B12 deficiency 07/27/2015  . History of pulmonary embolus (PE) 07/25/2015  . Adrenal nodule ([place]) 07/25/2015  . Pulmonary nodules/lesions, multiple 07/25/2015  . OSA (obstructive sleep apnea) 05/18/2015  . Thyroid  nodule 12/17/2014  . Heavy menstrual period 09/02/2014  . Gastroesophageal reflux disease without esophagitis 09/02/2014  . Morbid obesity, unspecified obesity type ([place]) 06/22/2014  . Large breasts 06/22/2014  . Anemia, iron deficiency 06/22/2014  . H/O hyperthyroidism 06/22/2014  . Right ventricular dilation 06/22/2014  . 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    Past Surgical History:  Procedure Laterality Date  . CESAREAN SECTION     26 years ago - repaired bleeding after vaginal birth    Family History  Problem Relation Age of Onset  . Hypertension Mother   . CVA Father   . Deep vein thrombosis Brother   . Pulmonary embolism Brother   . Anemia Sister     Social History   Socioeconomic History  . Marital status: Married    Spouse name: Not on file  . Number of children: 3  . Years of education: Not on file  . Highest education level: High school graduate  Occupational History  . Not on file  Social Needs  . Financial resource strain: Not very hard  . Food insecurity    Worry: Never true    Inability: Never true  . Transportation needs    Medical: No    Non-medical: No  Tobacco Use  . Smoking status: Never Smoker  . Smokeless tobacco: Never Used  Substance and Sexual Activity  . Alcohol use: No    Alcohol/week: 0.0 standard drinks    Comment: rarely  . Drug use: No  . Sexual activity: Not Currently    Partners: Male    Birth control/protection: Condom  Lifestyle  . Physical activity    Days per week: 3 days    Minutes per session: 20 min  . Stress: To some extent  Relationships  . Social connections    Talks on phone: More than three times a week    Gets together: Twice a week    Attends religious service: 1 to 4 times per year    Active member of club or organization: No    Attends meetings of clubs or organizations: Never    Relationship status: Married  . Intimate partner violence    Fear of  current or ex partner: No    Emotionally abused: No    Physically abused: No    Forced sexual activity: No  Other Topics Concern  . Not on file  Social History Narrative   Lives with 3 with 3 daughters, husband was incarcerated April 2018 - for stealing, she has not been keeping in touch with him because she is disappointed. 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    She lost her job secondary COVID-19      Current Outpatient Medications:  .  ferrous sulfate 325 (65 FE) MG tablet, Take 1 tablet (325 mg total) by mouth 2 (two) times daily with a meal., Disp: 180 tablet, Rfl: 1 .  meloxicam (MOBIC) 15 MG tablet, Take 1 tablet (  15 mg total) by mouth daily., Disp: 90 tablet, Rfl: 0 .  omeprazole (PRILOSEC) 40 MG capsule, Take 1 capsule (40 mg total) by mouth daily. (Patient taking differently: Take 40 mg by mouth daily as needed. ), Disp: 30 capsule, Rfl: 3 .  sertraline (ZOLOFT) 50 MG tablet, Take 1 tablet (50 mg total) by mouth daily., Disp: 135 tablet, Rfl: 1 .  vitamin B-12 1000 MCG tablet, Take 1 tablet (1,000 mcg total) by mouth daily., Disp: 30 tablet, Rfl: 0 .  amLODipine-valsartan (EXFORGE) 5-160 MG tablet, Take 1 tablet by mouth daily., Disp: 90 tablet, Rfl: 0 .  baclofen (LIORESAL) 10 MG tablet, Take 1 tablet (10 mg total) by mouth 3 (three) times daily as needed for muscle spasms., Disp: 30 each, Rfl: 0  Allergies  Allergen Reactions  . Aspirin Nausea And Vomiting  . Hctz [Hydrochlorothiazide] Other (See Comments)    Nausea, cramping  . Tomato Itching and Swelling    I personally reviewed active problem list, medication list, allergies, family history, social history, health maintenance with the patient/caregiver today.   ROS  Ten systems reviewed and is negative except as mentioned in HPI   Objective  Virtual encounter, vitals obtained at home  Today's Vitals   12/02/18 1328  BP: 133/76  Pulse: 83  Weight: (!) 365 lb  (165.6 kg)  Height: 5\' 7"  (1.702 m)   Body mass index is 57.17 kg/m.   Physical Exam  Awake, alert and oriented   PHQ2/9: Depression screen [facility] 2/9 12/02/2018 04/25/2018 04/01/2018 01/24/2018 07/22/2017  Decreased Interest 0 0 0 0 1  Down, Depressed, Hopeless 1 0 0 0 1  PHQ - 2 Score 1 0 0 0 2  Altered sleeping 2 0 0 3 0  Tired, decreased energy 1 0 0 1 1  Change in appetite 0 0 0 0 1  Feeling bad or failure about yourself  0 0 0 0 1  Trouble concentrating 0 0 0 0 0  Moving slowly or fidgety/restless 0 0 0 0 0  Suicidal thoughts 0 0 0 0 0  PHQ-9 Score 4 0 0 4 5  Difficult doing work/chores Not difficult at all Not difficult at all - Not difficult at all Not difficult at all   PHQ-2/9 Result is positive.    Fall Risk: Fall Risk  12/02/2018 04/25/2018 04/01/2018 01/24/2018 07/22/2017  Falls in the past year? 0 0 0 0 No  Number falls in past yr: 0 0 0 0 -  Injury with Fall? 0 0 0 0 -     Assessment & Plan  1. Major depression in remission (HCC)  - sertraline (ZOLOFT) 50 MG tablet; Take 1 tablet (50 mg total) by mouth daily.  Dispense: 135 tablet; Refill: 1  2. Hypertension, benign  - amLODipine-valsartan (EXFORGE) 5-160 MG tablet; Take 1 tablet by mouth daily.  Dispense: 90 tablet; Refill: 0  3. Muscle spasm  - baclofen (LIORESAL) 10 MG tablet; Take 1 tablet (10 mg total) by mouth 3 (three) times daily as needed for muscle spasms.  Dispense: 30 each; Refill: 0  4. Adrenal nodule ([place])  She states going on prn basis   5. Primary osteoarthritis of both knees  Taking meloxicam prn   6. B12 deficiency  Taking otc supplementation   7. Iron deficiency anemia due to chronic blood loss  Still taking iron, we will recheck labs next visit   8. Morbid obesity, unspecified obesity type [facility])  Discussed with the patient the risk posed  by an increased BMI. Discussed importance of portion control, calorie counting and at least 150 minutes of physical activity weekly. Avoid  sweet beverages and drink more water. Eat at least 6 servings of fruit and vegetables daily    I discussed the assessment and treatment plan with the patient. The patient was provided an opportunity to ask questions and all were answered. The patient agreed with the plan and demonstrated an understanding of the instructions.  The patient was advised to call back or seek an in-person evaluation if the symptoms worsen or if the condition fails to improve as anticipated.  I provided 25  minutes of non-face-to-face time during this encounter.

## 2018-12-04 ENCOUNTER — Telehealth: Payer: Self-pay

## 2018-12-04 ENCOUNTER — Other Ambulatory Visit: Payer: Self-pay

## 2018-12-04 DIAGNOSIS — Z1211 Encounter for screening for malignant neoplasm of colon: Secondary | ICD-10-CM

## 2018-12-04 MED ORDER — NA SULFATE-K SULFATE-MG SULF 17.5-3.13-1.6 GM/177ML PO SOLN: 1.0000 | Freq: Once | ORAL | 0 refills | Status: AC

## 2018-12-04 NOTE — Telephone Encounter (Signed)
Gastroenterology Pre-Procedure Review  Request Date: 12/19/18 Requesting Physician: Dr. Vicente Males  PATIENT REVIEW QUESTIONS: The patient responded to the following health history questions as indicated:    1. Are you having any GI issues? no 2. Do you have a personal history of Polyps? no 3. Do you have a family history of Colon Cancer or Polyps? no 4. Diabetes Mellitus? no 5. Joint replacements in the past 12 months?no 6. Major health problems in the past 3 months?no 7. Any artificial heart valves, MVP, or defibrillator?no    MEDICATIONS & ALLERGIES:    Patient reports the following regarding taking any anticoagulation/antiplatelet therapy:   Plavix, Coumadin, Eliquis, Xarelto, Lovenox, Pradaxa, Brilinta, or Effient? no Aspirin? no  Patient confirms/reports the following medications:  Current Outpatient Medications  Medication Sig Dispense Refill  . amLODipine-valsartan (EXFORGE) 5-160 MG tablet Take 1 tablet by mouth daily. 90 tablet 0  . baclofen (LIORESAL) 10 MG tablet Take 1 tablet (10 mg total) by mouth 3 (three) times daily as needed for muscle spasms. 30 each 0  . ferrous sulfate 325 (65 FE) MG tablet Take 1 tablet (325 mg total) by mouth 2 (two) times daily with a meal. 180 tablet 1  . meloxicam (MOBIC) 15 MG tablet Take 1 tablet (15 mg total) by mouth daily. 90 tablet 0  . omeprazole (PRILOSEC) 40 MG capsule Take 1 capsule (40 mg total) by mouth daily. (Patient taking differently: Take 40 mg by mouth daily as needed. ) 30 capsule 3  . sertraline (ZOLOFT) 50 MG tablet Take 1 tablet (50 mg total) by mouth daily. 135 tablet 1  . vitamin B-12 1000 MCG tablet Take 1 tablet (1,000 mcg total) by mouth daily. 30 tablet 0   No current facility-administered medications for this visit.     Patient confirms/reports the following allergies:  Allergies  Allergen Reactions  . Aspirin Nausea And Vomiting  . Hctz [Hydrochlorothiazide] Other (See Comments)    Nausea, cramping  . Tomato  Itching and Swelling    No orders of the defined types were placed in this encounter.   AUTHORIZATION INFORMATION Primary Insurance: 1D#: Group #:  Secondary Insurance: 1D#: Group #:  SCHEDULE INFORMATION: Date: 12/19/18 Time: Location:[facility]

## 2018-12-08 ENCOUNTER — Telehealth: Payer: Self-pay | Admitting: Family Medicine

## 2018-12-08 ENCOUNTER — Other Ambulatory Visit: Payer: Self-pay

## 2018-12-08 DIAGNOSIS — Z1211 Encounter for screening for malignant neoplasm of colon: Secondary | ICD-10-CM

## 2018-12-08 DIAGNOSIS — F325 Major depressive disorder, single episode, in full remission: Secondary | ICD-10-CM

## 2018-12-08 MED ORDER — SERTRALINE HCL 50 MG PO TABS: 75.0000 mg | ORAL_TABLET | Freq: Every day | ORAL | 1 refills | Status: DC

## 2018-12-08 NOTE — Telephone Encounter (Signed)
Robin at [facility] calling about the Rx sertraline (ZOLOFT) 50 MG tablet  Pt has been on 1.5 /day.  The quantity sent in is correct for that amount daily, but instructions only say 1 /day. Can you send in new Rx with the correct instructions if that is right?  [facility] [place] (N), [place] [facility] [place] [phone] (Phone) [phone] (Fax)

## 2018-12-08 NOTE — Addendum Note (Signed)
Addended by: Steele Sizer F on: 12/08/2018 01:26 PM   Modules accepted: Orders

## 2018-12-16 ENCOUNTER — Telehealth: Payer: Self-pay

## 2018-12-16 ENCOUNTER — Other Ambulatory Visit: Admission: RE | Admit: 2018-12-16 | Payer: Medicaid Other | Source: Ambulatory Visit

## 2018-12-16 NOTE — Telephone Encounter (Signed)
Patient contacted office to cancel her Friday 12/19/18 colonoscopy due to inability to tolerate the COVID test. Pt has been asked to call the office back if she decides to try again. Colonoscopy has been canceled with Endoscopy Dept.  Thanks [employer]

## 2018-12-19 ENCOUNTER — Encounter: Admission: RE | Payer: Self-pay | Source: Home / Self Care

## 2018-12-19 ENCOUNTER — Ambulatory Visit: Admission: RE | Admit: 2018-12-19 | Payer: Medicaid Other | Source: Home / Self Care | Admitting: Gastroenterology

## 2018-12-19 SURGERY — COLONOSCOPY WITH PROPOFOL
Anesthesia: General

## 2019-02-04 ENCOUNTER — Other Ambulatory Visit: Payer: Self-pay | Admitting: Family Medicine

## 2019-02-04 DIAGNOSIS — M170 Bilateral primary osteoarthritis of knee: Secondary | ICD-10-CM

## 2019-02-06 ENCOUNTER — Telehealth: Payer: Medicaid Other | Admitting: Physician Assistant

## 2019-02-06 DIAGNOSIS — R300 Dysuria: Secondary | ICD-10-CM | POA: Diagnosis not present

## 2019-02-06 DIAGNOSIS — R399 Unspecified symptoms and signs involving the genitourinary system: Secondary | ICD-10-CM

## 2019-02-06 MED ORDER — SULFAMETHOXAZOLE-TRIMETHOPRIM 800-160 MG PO TABS: 1.0000 | ORAL_TABLET | Freq: Two times a day (BID) | ORAL | 0 refills | Status: DC

## 2019-02-06 NOTE — Progress Notes (Signed)
We are sorry that you are not feeling well.  Here is how we plan to help!  Based on what you shared with me it looks like you most likely have a simple urinary tract infection.  A UTI (Urinary Tract Infection) is a bacterial infection of the bladder.  Most cases of urinary tract infections are simple to treat but a key part of your care is to encourage you to drink plenty of fluids and watch your symptoms carefully.  I have prescribed Bactrim DS One tablet twice a day for 5 days.  Your symptoms should gradually improve. Call us if the burning in your urine worsens, you develop worsening fever, back pain or pelvic pain or if your symptoms do not resolve after completing the antibiotic.  Urinary tract infections can be prevented by drinking plenty of water to keep your body hydrated.  Also be sure when you wipe, wipe from front to back and don't hold it in!  If possible, empty your bladder every 4 hours.  Your e-visit answers were reviewed by a board certified advanced clinical practitioner to complete your personal care plan.  Depending on the condition, your plan could have included both over the counter or prescription medications.  If there is a problem please reply  once you have received a response from your provider.  Your safety is important to us.  If you have drug allergies check your prescription carefully.    You can use MyChart to ask questions about today's visit, request a non-urgent call back, or ask for a work or school excuse for 24 hours related to this e-Visit. If it has been greater than 24 hours you will need to follow up with your provider, or enter a new e-Visit to address those concerns.   You will get an e-mail in the next two days asking about your experience.  I hope that your e-visit has been valuable and will speed your recovery. Thank you for using e-visits.   Greater than 5 minutes, yet less than 10 minutes of time have been spent researching, coordinating and  implementing care for this patient today.   

## 2019-03-04 ENCOUNTER — Telehealth: Payer: Self-pay | Admitting: Family Medicine

## 2019-03-04 DIAGNOSIS — I10 Essential (primary) hypertension: Secondary | ICD-10-CM

## 2019-03-05 NOTE — Telephone Encounter (Signed)
lvm for scheduling °

## 2019-03-17 DIAGNOSIS — H524 Presbyopia: Secondary | ICD-10-CM | POA: Diagnosis not present

## 2019-03-17 DIAGNOSIS — H5213 Myopia, bilateral: Secondary | ICD-10-CM | POA: Diagnosis not present

## 2019-04-02 ENCOUNTER — Other Ambulatory Visit: Payer: Self-pay | Admitting: Family Medicine

## 2019-04-02 DIAGNOSIS — I10 Essential (primary) hypertension: Secondary | ICD-10-CM

## 2019-04-14 DIAGNOSIS — H5203 Hypermetropia, bilateral: Secondary | ICD-10-CM | POA: Diagnosis not present

## 2019-05-05 ENCOUNTER — Telehealth: Payer: Self-pay | Admitting: Family Medicine

## 2019-05-05 DIAGNOSIS — M170 Bilateral primary osteoarthritis of knee: Secondary | ICD-10-CM

## 2019-05-05 NOTE — Telephone Encounter (Signed)
Requested medications are due for refill today?  Yes  Requested medications are on active medication list?  Yes  Last Refill:   02/04/2019 # 90 with no refills   Future visit scheduled? No  Notes to Clinic:  Medication failed RX refill protocol due to no labs in the past 360 days. Last lab work performed on 01/24/2018.

## 2019-05-06 NOTE — Telephone Encounter (Signed)
appt scheduled with Dr Ancil Boozer for tomorrow 5.6.2021

## 2019-05-07 ENCOUNTER — Ambulatory Visit: Payer: Self-pay

## 2019-05-07 ENCOUNTER — Ambulatory Visit: Payer: Medicaid Other | Admitting: Family Medicine

## 2019-05-07 NOTE — Telephone Encounter (Signed)
Pt. Reports she has shortness of breath x 1 month. Comes and goes. Mainly with exertion and walking distances. Denies any chest pain. No wheezing. Will keep appointment. Instructed if symptoms worsen, go to ED. Verbalizes understanding.  Answer Assessment - Initial Assessment Questions 1. RESPIRATORY STATUS: "Describe your breathing?" (e.g., wheezing, shortness of breath, unable to speak, severe coughing)      Shortness of breath 2. ONSET: "When did this breathing problem begin?"      1 month ago 3. PATTERN "Does the difficult breathing come and go, or has it been constant since it started?"      Comes and goes 4. SEVERITY: "How bad is your breathing?" (e.g., mild, moderate, severe)    - MILD: No SOB at rest, mild SOB with walking, speaks normally in sentences, can lay down, no retractions, pulse < 100.    - MODERATE: SOB at rest, SOB with minimal exertion and prefers to sit, cannot lie down flat, speaks in phrases, mild retractions, audible wheezing, pulse 100-120.    - SEVERE: Very SOB at rest, speaks in single words, struggling to breathe, sitting hunched forward, retractions, pulse > 120      Mild 5. RECURRENT SYMPTOM: "Have you had difficulty breathing before?" If so, ask: "When was the last time?" and "What happened that time?"      No 6. CARDIAC HISTORY: "Do you have any history of heart disease?" (e.g., heart attack, angina, bypass surgery, angioplasty)      No 7. LUNG HISTORY: "Do you have any history of lung disease?"  (e.g., pulmonary embolus, asthma, emphysema)     Sleep apnea 8. CAUSE: "What do you think is causing the breathing problem?"      Unsure 9. OTHER SYMPTOMS: "Do you have any other symptoms? (e.g., dizziness, runny nose, cough, chest pain, fever)     No 10. PREGNANCY: "Is there any chance you are pregnant?" "When was your last menstrual period?"       No 11. TRAVEL: "Have you traveled out of the country in the last month?" (e.g., travel history, exposures)        No  Protocols used: BREATHING DIFFICULTY-A-AH

## 2019-05-12 ENCOUNTER — Other Ambulatory Visit: Payer: Self-pay

## 2019-05-12 ENCOUNTER — Ambulatory Visit (INDEPENDENT_AMBULATORY_CARE_PROVIDER_SITE_OTHER): Payer: Medicaid Other | Admitting: Family Medicine

## 2019-05-12 ENCOUNTER — Encounter: Payer: Self-pay | Admitting: Family Medicine

## 2019-05-12 ENCOUNTER — Ambulatory Visit: Payer: Medicaid Other | Admitting: Family Medicine

## 2019-05-12 DIAGNOSIS — E278 Other specified disorders of adrenal gland: Secondary | ICD-10-CM

## 2019-05-12 DIAGNOSIS — M170 Bilateral primary osteoarthritis of knee: Secondary | ICD-10-CM

## 2019-05-12 DIAGNOSIS — E0520 Thyrotoxicosis with toxic multinodular goiter without thyrotoxic crisis or storm: Secondary | ICD-10-CM | POA: Diagnosis not present

## 2019-05-12 DIAGNOSIS — Z13220 Encounter for screening for lipoid disorders: Secondary | ICD-10-CM | POA: Diagnosis not present

## 2019-05-12 DIAGNOSIS — E538 Deficiency of other specified B group vitamins: Secondary | ICD-10-CM

## 2019-05-12 DIAGNOSIS — Z923 Personal history of irradiation: Secondary | ICD-10-CM | POA: Diagnosis not present

## 2019-05-12 DIAGNOSIS — Z862 Personal history of diseases of the blood and blood-forming organs and certain disorders involving the immune mechanism: Secondary | ICD-10-CM

## 2019-05-12 DIAGNOSIS — Z131 Encounter for screening for diabetes mellitus: Secondary | ICD-10-CM | POA: Diagnosis not present

## 2019-05-12 DIAGNOSIS — F320 Major depressive disorder, single episode, mild: Secondary | ICD-10-CM

## 2019-05-12 DIAGNOSIS — I420 Dilated cardiomyopathy: Secondary | ICD-10-CM

## 2019-05-12 DIAGNOSIS — I10 Essential (primary) hypertension: Secondary | ICD-10-CM | POA: Diagnosis not present

## 2019-05-12 DIAGNOSIS — I517 Cardiomegaly: Secondary | ICD-10-CM

## 2019-05-12 DIAGNOSIS — M62838 Other muscle spasm: Secondary | ICD-10-CM | POA: Diagnosis not present

## 2019-05-12 DIAGNOSIS — K219 Gastro-esophageal reflux disease without esophagitis: Secondary | ICD-10-CM | POA: Diagnosis not present

## 2019-05-12 DIAGNOSIS — E279 Disorder of adrenal gland, unspecified: Secondary | ICD-10-CM

## 2019-05-12 MED ORDER — SERTRALINE HCL 50 MG PO TABS: 75.0000 mg | ORAL_TABLET | Freq: Every day | ORAL | 1 refills | Status: DC

## 2019-05-12 MED ORDER — MELOXICAM 15 MG PO TABS: 15.0000 mg | ORAL_TABLET | Freq: Every day | ORAL | 0 refills | Status: DC

## 2019-05-12 MED ORDER — BACLOFEN 10 MG PO TABS: 10.0000 mg | ORAL_TABLET | Freq: Three times a day (TID) | ORAL | 0 refills | Status: DC | PRN

## 2019-05-12 NOTE — Progress Notes (Signed)
Name: Madeline Dominguez   MRN: [id number]    DOB: Sep 01, 1965   Date:05/12/2019       Progress Note  Subjective  Chief Complaint  Chief Complaint  Patient presents with  . Hypertension  . Gastroesophageal Reflux  . Shortness of Breath    on exertion  . Anemia    I connected with  [name] on 05/12/19 at  1:40 PM EDT by telephone and verified that I am speaking with the correct person using two identifiers.  I discussed the limitations, risks, security and privacy concerns of performing an evaluation and management service by telephone and the availability of in person appointments. Staff also discussed with the patient that there may be a patient responsible charge related to this service. Patient Location: at home  Provider Location: [facility]   HPI  Cardiomyopathy: denies orthopnea but has noticed  decrease in exercise tolerance and some lower extremity edema with activity . She is  on lisinopril. No chest painor palpitation. AT the time of evaluation back in 2018 it was found to be secondary to hyperthyroidism and uncontrolled HTN, she has not been in our office since January 2021 due to COVID-19 pandemic. She has not been to cardiologist , Dr. Virl Axe since 08/2016. At the time SOB was thought to be unlikely due to cardiomyopathy , but advised to continue ACE, he suggested adding beta blocker if needed, but difficult to assess over a phone visit. It may be secondary to weight gain also   HTN:bp is at home has been in the 130's-135's/78-80 , used to see nephrologist , she is on Exforge because ace , no chest pain. She has noticed episodes of angioedema ( after she eats tomatoes and used to have that as a child) , she stopped ACE and tomatoes and no angioedema since Dec 2020   Depression: severe episode in 2018 with anhedonia, lack of appetite, she had pain all over, unable to work for months. She was started on Zoloft she also had some therapy and is doing better. 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 She has not been back since  Goiter: no dysphagia,;ast visit with Endo was Fall 2018, advised to monitor TSH, doing well, last level normal, explained she is due to have repeat labs, she was supposed to come in for CPE and labs, today she converted her visit to a virtual visit, explained the importance of coming in for in person visit   Obesity:discussed life style modification again, she thinks she has gained weight , but her scale is not working. Battery died. She has been sedentary for the past year ( since pandemic) she has not been working and only walks to and from the car but she does not leave her house daily   GERD: taking medication prn only and is doing well.Unchanged   OA: taking meloxicam daily, she daily pain aching pain on both knees. Standing aggravates the pain , she states has intermittent effusion when more active  Anemia:resolved, last labs reviewed with patient. B12 also back to normal She is due for  colonoscopy she had an appointment last year but cancelled. We will recheck labs, but needs to see GI and re-schedule colonoscopy   Muscle Spasms: she takes medication prn, at most once a week, sometimes less frequently    Patient Active Problem List   Diagnosis Date Noted  . Stress incontinence in female 03/25/2017  . History of deep venous thrombosis (DVT) of distal vein of right lower extremity 01/18/2017  . Adrenal adenoma, left 09/11/2016  . Current moderate episode of major depressive disorder without prior episode ([place]) 08/10/2016  .  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 History of radioactive iodine thyroid ablation   . 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 Iron deficiency anemia 07/27/2015  . B12 deficiency 07/27/2015  . History of pulmonary embolus (PE) 07/25/2015  . Adrenal nodule ([place]) 07/25/2015  . Pulmonary nodules/lesions, multiple 07/25/2015  . OSA (obstructive sleep apnea) 05/18/2015  . Thyroid nodule 12/17/2014  . Heavy menstrual period 09/02/2014  . Gastroesophageal reflux disease without esophagitis 09/02/2014  . Morbid obesity, unspecified obesity type ([place]) 06/22/2014  . Large breasts 06/22/2014  . Anemia, iron deficiency 06/22/2014  . H/O hyperthyroidism 06/22/2014  . Right ventricular dilation 06/22/2014  . 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    Past Surgical History:  Procedure Laterality Date  . CESAREAN SECTION     26 years ago - repaired bleeding after vaginal birth    Family History  Problem Relation Age of Onset  . Hypertension Mother   . CVA Father   . Deep vein thrombosis Brother   . Pulmonary embolism Brother   . Anemia Sister     Social History   Tobacco Use  . Smoking status: Never Smoker  . Smokeless tobacco: Never Used  Substance Use Topics  . Alcohol use: No    Alcohol/week: 0.0 standard drinks    Comment: rarely    Current Outpatient Medications:  .  amLODipine-valsartan (EXFORGE) 5-160 MG tablet, Take 1 tablet by mouth once daily, Disp: 90 tablet, Rfl: 0 .  baclofen (LIORESAL) 10 MG tablet, Take 1 tablet (10 mg total) by mouth 3 (three) times daily as needed for muscle spasms., Disp: 30 each, Rfl: 0 .  ferrous sulfate 325 (65 FE) MG tablet, Take 1 tablet (325 mg total) by mouth 2 (two) times daily with a meal., Disp: 180 tablet, Rfl: 1 .  meloxicam (MOBIC) 15 MG tablet, Take 1 tablet by mouth once daily, Disp: 90 tablet, Rfl: 0 .  omeprazole (PRILOSEC) 40  MG capsule, Take 1 capsule (40 mg total) by mouth daily. (Patient taking differently: Take 40 mg by mouth daily as needed. ), Disp: 30 capsule, Rfl: 3 .  sertraline (ZOLOFT) 50 MG tablet, Take 1.5 tablets (75 mg total) by mouth daily., Disp: 135 tablet, Rfl: 1 .  vitamin B-12 1000 MCG tablet, Take 1 tablet (1,000 mcg total) by mouth daily., Disp: 30 tablet, Rfl: 0 .  sulfamethoxazole-trimethoprim (BACTRIM DS) 800-160 MG tablet, Take 1 tablet by mouth 2 (two) times daily. (Patient not taking: Reported on 05/12/2019), Disp: 10 tablet, Rfl: 0  Allergies  Allergen Reactions  . Aspirin Nausea And Vomiting  . Hctz [Hydrochlorothiazide] Other (See Comments)    Nausea, cramping  . Tomato Itching and Swelling    I personally reviewed active problem list, medication list, allergies, family history, social history, health maintenance with the patient/caregiver today.   ROS  Ten systems reviewed and is negative except as mentioned  in HPI   Objective  Virtual encounter, vitals not obtained.  There is no height or weight on file to calculate BMI.  Physical Exam  Awake, alert and oriented  PHQ2/9: Depression screen [facility] 2/9 05/12/2019 12/02/2018 04/25/2018 04/01/2018 01/24/2018  Decreased Interest 1 0 0 0 0  Down, Depressed, Hopeless 0 1 0 0 0  PHQ - 2 Score 1 1 0 0 0  Altered sleeping 0 2 0 0 3  Tired, decreased energy 0 1 0 0 1  Change in appetite 0 0 0 0 0  Feeling bad or failure about yourself  0 0 0 0 0  Trouble concentrating 0 0 0 0 0  Moving slowly or fidgety/restless 0 0 0 0 0  Suicidal thoughts 0 0 0 0 0  PHQ-9 Score 1 4 0 0 4  Difficult doing work/chores Not difficult at all Not difficult at all Not difficult at all - Not difficult at all   PHQ-2/9 Result is positive     Fall Risk: Fall Risk  05/12/2019 12/02/2018 04/25/2018 04/01/2018 01/24/2018  Falls in the past year? 0 0 0 0 0  Number falls in past yr: 0 0 0 0 0  Injury with Fall? 0 0 0 0 0  Follow up Falls evaluation completed  - - - -     Assessment & Plan  1. Hypertension, benign  She will come by to get vitals done today  - CBC with Differential/Platelet - COMPLETE METABOLIC PANEL WITH GFR  2. Adrenal nodule ([place])  Released from endo we will keep monitoring her bp and if needed repeat labs  3. Muscle spasm  - baclofen (LIORESAL) 10 MG tablet; Take 1 tablet (10 mg total) by mouth 3 (three) times daily as needed for muscle spasms.  Dispense: 30 each; Refill: 0  4. Morbid obesity, unspecified obesity type [facility])  Discussed with the patient the risk posed by an increased BMI. Discussed importance of portion control, calorie counting and at least 150 minutes of physical activity weekly. Avoid sweet beverages and drink more water. Eat at least 6 servings of fruit and vegetables daily   5. History of radioactive iodine thyroid ablation   6. Goiter, toxic, multinodular  - TSH  7. B12 deficiency  - Vitamin B12  8. Gastroesophageal reflux disease without esophagitis   9. Primary osteoarthritis of both knees  Discussed risk of long term use nsaid's - meloxicam (MOBIC) 15 MG tablet; Take 1 tablet (15 mg total) by mouth daily.  Dispense: 90 tablet; Refill: 0  10. Mild major depression (HCC)  - sertraline (ZOLOFT) 50 MG tablet; Take 1.5 tablets (75 mg total) by mouth daily.  Dispense: 135 tablet; Refill: 1  11. History of iron deficiency anemia  - CBC with Differential/Platelet She was reminded to re-schedule colonoscopy   12. Diabetes mellitus screening  - Hemoglobin A1c  13. Lipid screening  - Lipid panel  14. Mild concentric left ventricular hypertrophy (LVH)   15. Dilated cardiomyopathy ([place])  She will come by for labs, explained sob may be due to progression of cardiomyopathy. She wants to rule out anemia prior to going back to Dr. Caryl Comes   I discussed the assessment and treatment plan with the patient. The patient was provided an opportunity to ask questions and all were answered.  The patient agreed with the plan and demonstrated an understanding of the instructions.   The patient was advised to call back or seek an in-person evaluation if the symptoms worsen or if  the condition fails to improve as anticipated.  I provided 25  minutes of non-face-to-face time during this encounter.  Loistine Chance, MD

## 2019-05-13 LAB — HEMOGLOBIN A1C
Hgb A1c MFr Bld: 5.2 % of total Hgb (ref <5.7)
Mean Plasma Glucose: 103 (calc)
eAG (mmol/L): 5.7 (calc)

## 2019-05-13 LAB — TSH: TSH: 7.71 mIU/L — ABNORMAL HIGH

## 2019-05-13 LAB — COMPLETE METABOLIC PANEL WITH GFR
AG Ratio: 1.1 (calc) (ref 1.0–2.5)
ALT: 11 U/L (ref 6–29)
AST: 11 U/L (ref 10–35)
Albumin: 3.9 g/dL (ref 3.6–5.1)
Alkaline phosphatase (APISO): 94 U/L (ref 37–153)
BUN: 12 mg/dL (ref 7–25)
CO2: 32 mmol/L (ref 20–32)
Calcium: 9 mg/dL (ref 8.6–10.4)
Chloride: 104 mmol/L (ref 98–110)
Creat: 0.66 mg/dL (ref 0.50–1.05)
GFR, Est African American: 118 mL/min/{1.73_m2} (ref >=60)
GFR, Est Non African American: 102 mL/min/{1.73_m2} (ref >=60)
Globulin: 3.4 g/dL (calc) (ref 1.9–3.7)
Glucose, Bld: 98 mg/dL (ref 65–99)
Potassium: 3.9 mmol/L (ref 3.5–5.3)
Sodium: 141 mmol/L (ref 135–146)
Total Bilirubin: 0.4 mg/dL (ref 0.2–1.2)
Total Protein: 7.3 g/dL (ref 6.1–8.1)

## 2019-05-13 LAB — CBC WITH DIFFERENTIAL/PLATELET
Absolute Monocytes: 912 cells/uL (ref 200–950)
Basophils Absolute: 53 cells/uL (ref 0–200)
Basophils Relative: 0.5 %
Eosinophils Absolute: 212 cells/uL (ref 15–500)
Eosinophils Relative: 2 %
HCT: 38.4 % (ref 35.0–45.0)
Hemoglobin: 12.3 g/dL (ref 11.7–15.5)
Lymphs Abs: 3583 cells/uL (ref 850–3900)
MCH: 29.4 pg (ref 27.0–33.0)
MCHC: 32 g/dL (ref 32.0–36.0)
MCV: 91.9 fL (ref 80.0–100.0)
MPV: 11 fL (ref 7.5–12.5)
Monocytes Relative: 8.6 %
Neutro Abs: 5841 cells/uL (ref 1500–7800)
Neutrophils Relative %: 55.1 %
Platelets: 270 10*3/uL (ref 140–400)
RBC: 4.18 10*6/uL (ref 3.80–5.10)
RDW: 13.3 % (ref 11.0–15.0)
Total Lymphocyte: 33.8 %
WBC: 10.6 10*3/uL (ref 3.8–10.8)

## 2019-05-13 LAB — VITAMIN B12: Vitamin B-12: 380 pg/mL (ref 200–1100)

## 2019-05-13 LAB — LIPID PANEL
Cholesterol: 201 mg/dL — ABNORMAL HIGH (ref <200)
HDL: 76 mg/dL (ref >=50)
LDL Cholesterol (Calc): 109 mg/dL (calc) — ABNORMAL HIGH
Non-HDL Cholesterol (Calc): 125 mg/dL (calc) (ref <130)
Total CHOL/HDL Ratio: 2.6 (calc) (ref <5.0)
Triglycerides: 72 mg/dL (ref <150)

## 2019-06-02 IMAGING — CT CT HEAD W/O CM
3 series · 14 of 47 positions shown, 16 images · non-contrast
Comparison: None.

CLINICAL DATA: Headache dizziness drowsiness, photosensitivity

EXAM:
CT HEAD WITHOUT CONTRAST
TECHNIQUE: Contiguous axial images were obtained from the base of the skull
through the vertex without intravenous contrast.

[Series 2: head wo · axial · 0.47mm/px · z∈[-168,-38]mm · 8 of 32 slices shown, 10 images]
[im 3/32  brain]
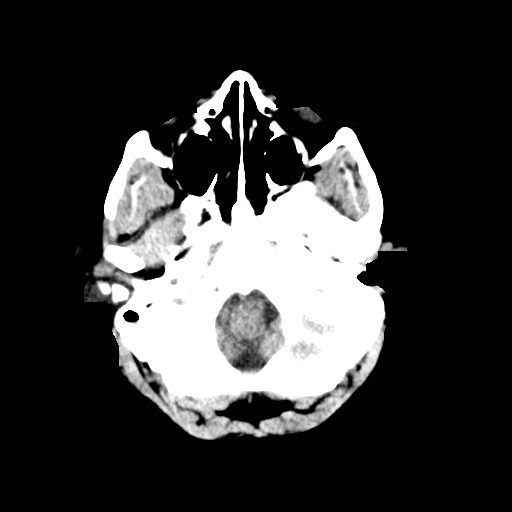
[im 3/32  bone]
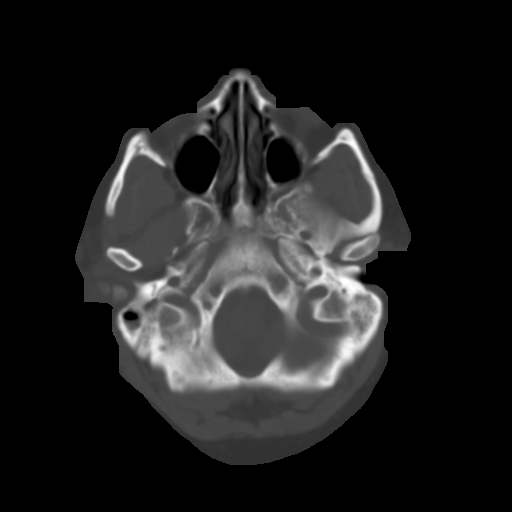
[im 7/32  brain]
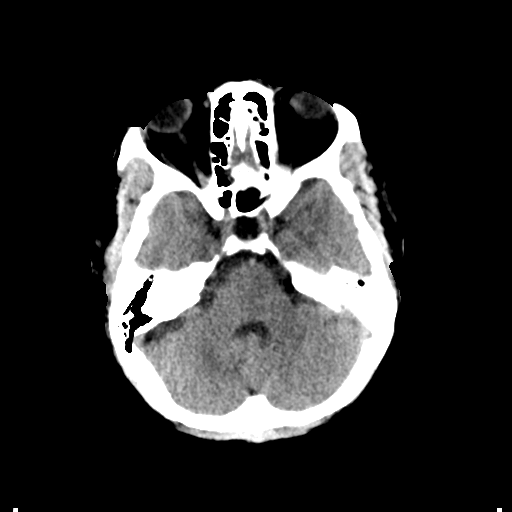
[im 10/32  brain]
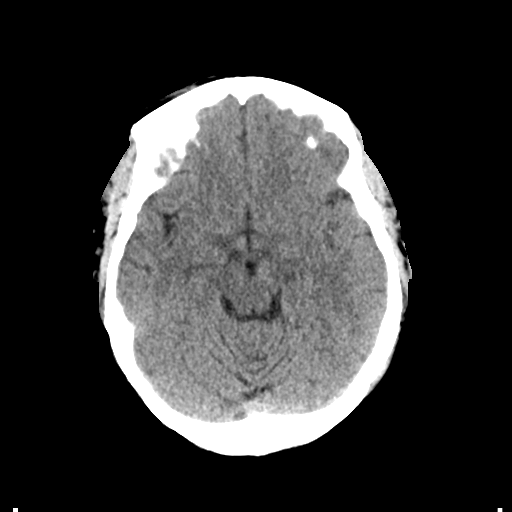
[im 14/32  brain]
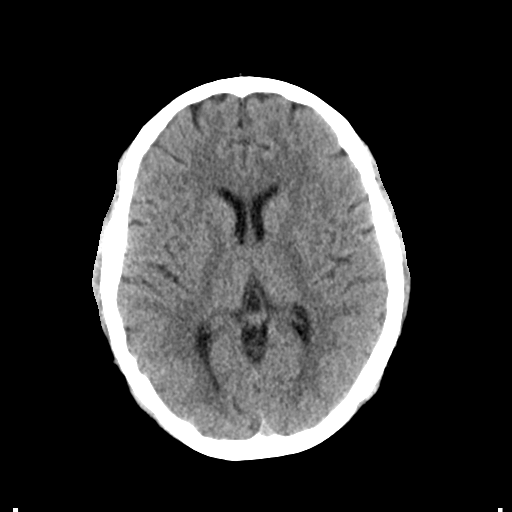
[im 18/32  brain]
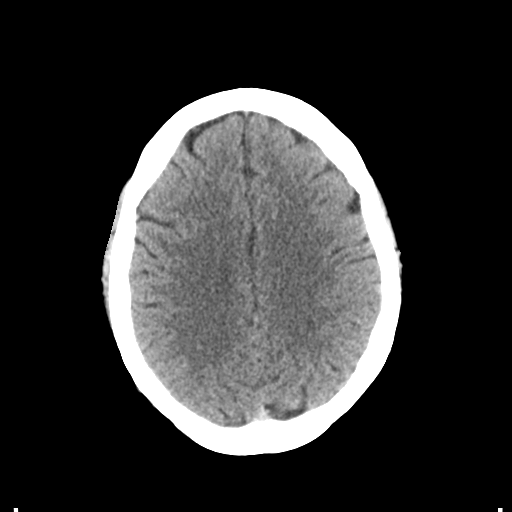
[im 18/32  bone]
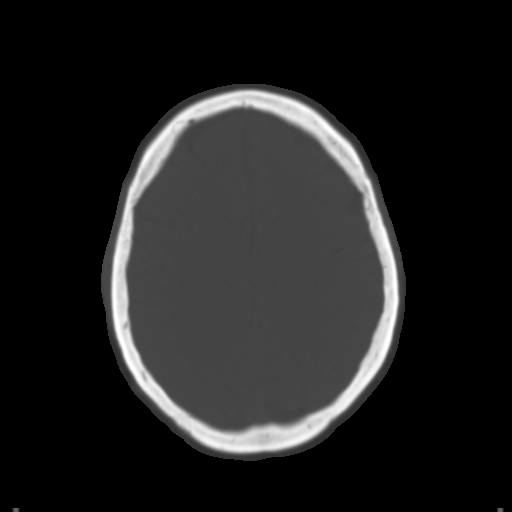
[im 22/32  brain]
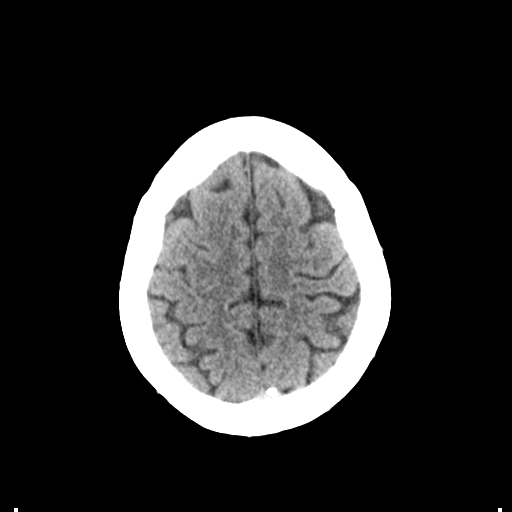
[im 25/32  brain]
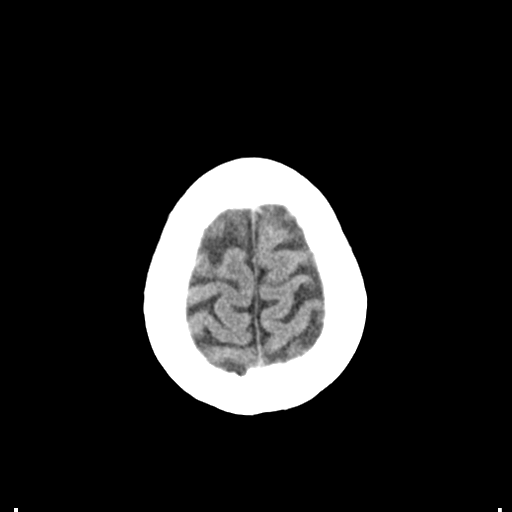
[im 29/32  brain]
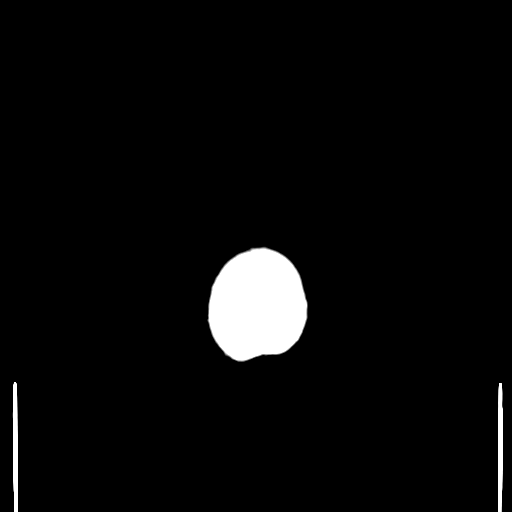

[Series 4: coronal soft tissue · coronal · 0.32mm/px · 3 of 62 slices shown]
[im 21/62  brain]
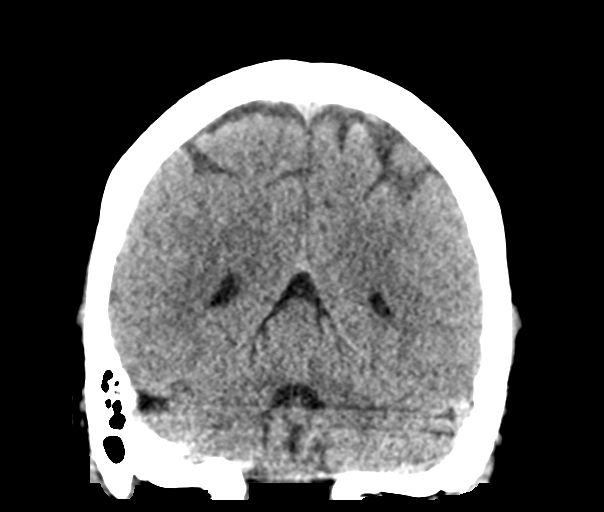
[im 28/62  brain]
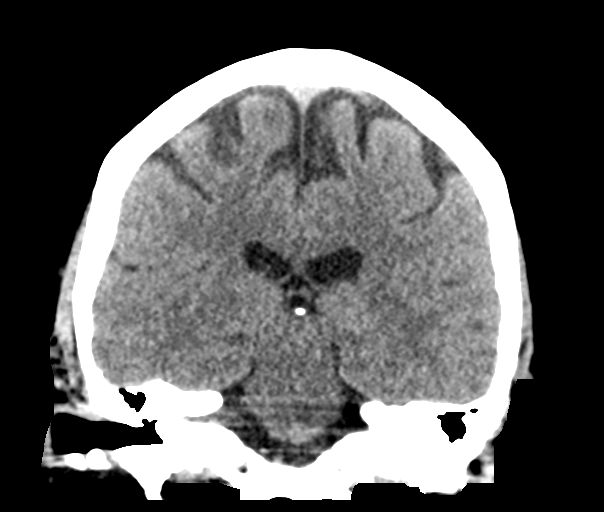
[im 34/62  brain]
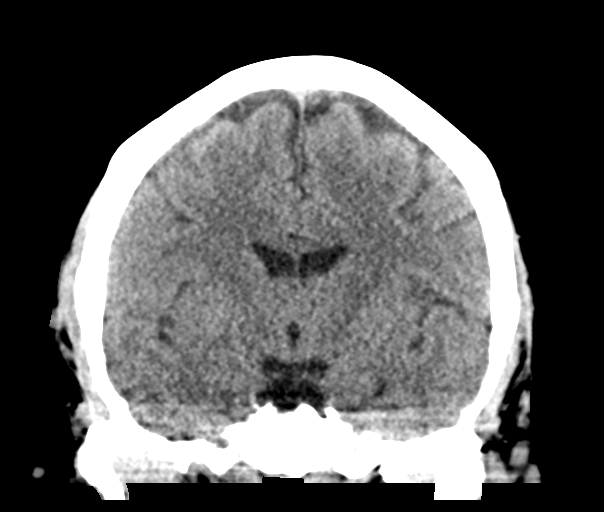

[Series 5: sagittal soft tissue · sagittal · 0.31mm/px · 3 of 55 slices shown]
[im 19/55  brain]
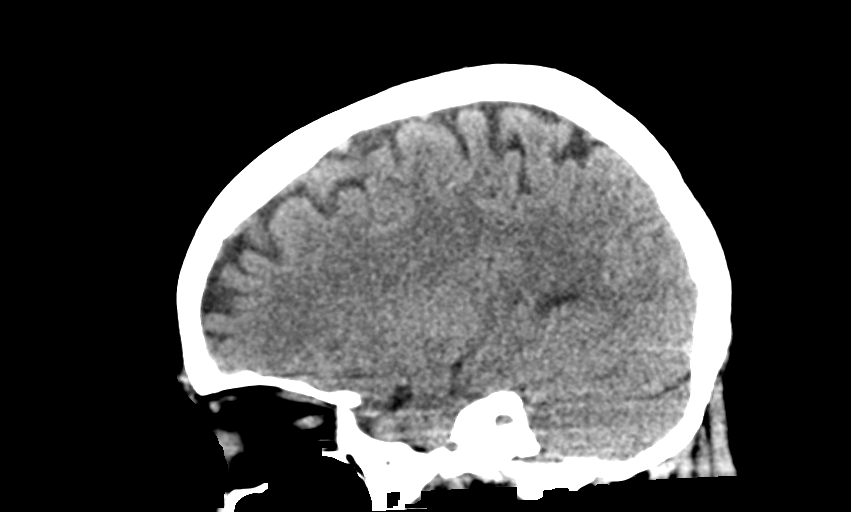
[im 28/55  brain]
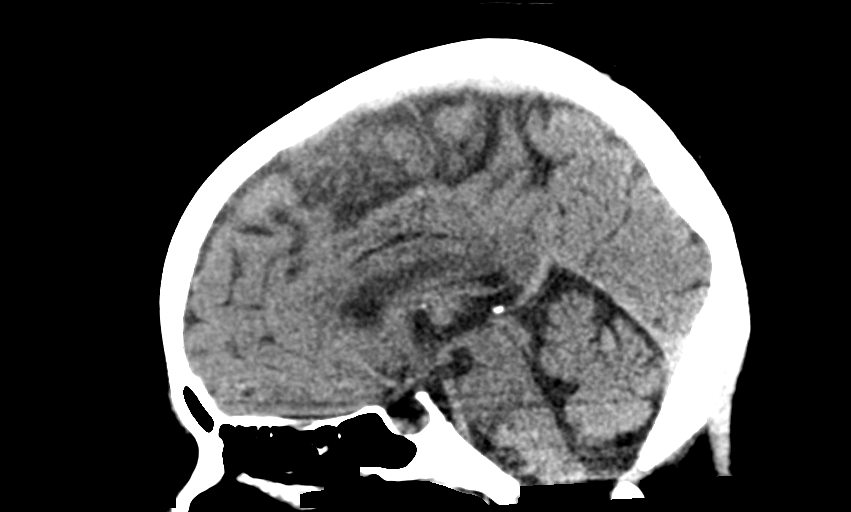
[im 37/55  brain]
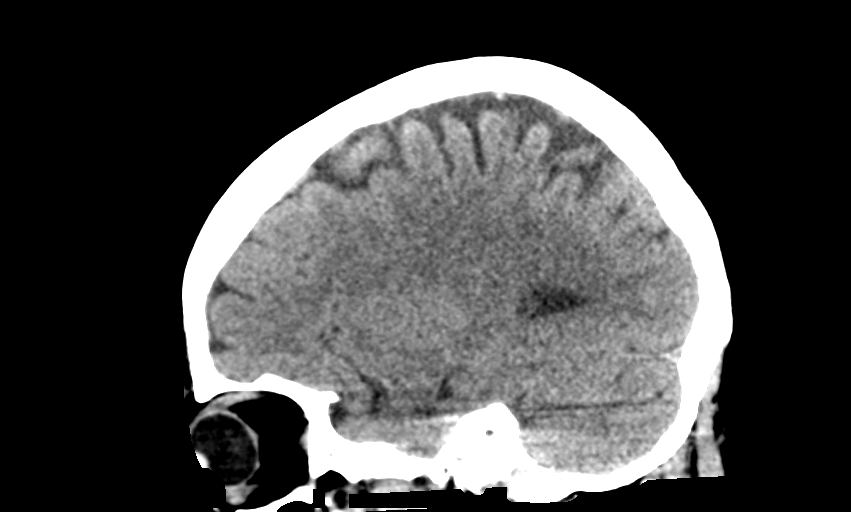

[14 of 47 positions shown; findings below may reference images not displayed]

FINDINGS: Brain: No evidence of acute infarction, hemorrhage, hydrocephalus,
extra-axial collection or mass lesion/mass effect.

Vascular: Negative for hyperdense vessel

Skull: Negative

Sinuses/Orbits: Paranasal sinuses clear. Air-fluid level right
mastoid tip. Chronic mastoiditis on the left.

Other: None
IMPRESSION: Normal CT of the brain

Small air-fluid level right mastoid tip

## 2019-07-02 ENCOUNTER — Other Ambulatory Visit: Payer: Self-pay | Admitting: Family Medicine

## 2019-07-02 DIAGNOSIS — I10 Essential (primary) hypertension: Secondary | ICD-10-CM

## 2019-07-14 ENCOUNTER — Ambulatory Visit (INDEPENDENT_AMBULATORY_CARE_PROVIDER_SITE_OTHER): Payer: Medicaid Other | Admitting: Family Medicine

## 2019-07-14 ENCOUNTER — Other Ambulatory Visit: Payer: Self-pay

## 2019-07-14 ENCOUNTER — Encounter: Payer: Self-pay | Admitting: Family Medicine

## 2019-07-14 VITALS — BP 138/96 | HR 86 | Temp 97.3°F | Resp 16 | Ht 67.0 in | Wt 387.0 lb

## 2019-07-14 DIAGNOSIS — F419 Anxiety disorder, unspecified: Secondary | ICD-10-CM | POA: Diagnosis not present

## 2019-07-14 DIAGNOSIS — F320 Major depressive disorder, single episode, mild: Secondary | ICD-10-CM

## 2019-07-14 DIAGNOSIS — R110 Nausea: Secondary | ICD-10-CM | POA: Diagnosis not present

## 2019-07-14 MED ORDER — ONDANSETRON 4 MG PO TBDP: 4.0000 mg | ORAL_TABLET | Freq: Three times a day (TID) | ORAL | 0 refills | Status: DC | PRN

## 2019-07-14 MED ORDER — BUSPIRONE HCL 5 MG PO TABS: 5.0000 mg | ORAL_TABLET | Freq: Three times a day (TID) | ORAL | 2 refills | Status: DC | PRN

## 2019-07-14 MED ORDER — PANTOPRAZOLE SODIUM 20 MG PO TBEC
20.0000 mg | DELAYED_RELEASE_TABLET | ORAL | 1 refills | Status: DC
Start: 2019-07-14 — End: 2020-05-04

## 2019-07-14 NOTE — Patient Instructions (Signed)
[place] in the [facility]    Psychiatric Services:  Dr. Modena Morrow - [phone] [place] [facility]) - [facility] [place]) - [phone] Dr. Chucky May [facility]) [phone] [facility] [place]) [phone] [place] ([place]) - [phone] [facility] [place]) - [place], [address], [place], [place]  Intensive Outpatient Programs: [facility]      [address] [place], [place] Both a day and evening program       [facility] Outpatient     [address]        [place], [place] [address] [phone]         ADS: Alcohol & Drug Svcs [place]: [phone] or [phone] [address] [place], [place] [address] [email or web address]   Substance Abuse Resources: - Alcohol and Drug Services  [phone] - [facility] [phone] - The [place] [phone] - Residential & Outpatient Substance Abuse Program  [phone]  Psychological Services: - [place]  [place], [phone] [place]. [address] [place] [employer], [place]: [phone] or [phone], [email or web address]  Mobile Crisis Teams:                                        Therapeutic Alternatives         Mobile Crisis Care Unit [phone]             [facility] [place] Dr. Lady Gary Boykin [address] [place] [phone]  Self-Help/Support Groups: [place]. of [employer] of support groups [phone] (call for more info)  Narcotics Anonymous (NA) Caring Services [address] [place] - 2 meetings at this location  Residential Treatment Programs:  [place]       [place] [address] [place], [place]  [address] [place]  [address] [place] [address] [place], [place] [address] [phone] Admissions: 8am-3pm M-F  Incentives Substance [place]     [address] [place], [place] [address]       [phone]         [facility] [address] Jadene Pierini [place], [place]  [facility] [address] [place], [place]  [facility] - Intensive Outpatient      [address]     [place], [place]         [facility] ([place]     [address] [place], [place] or [phone]  Residential Treatment Services (RTS), Medicaid [address] [place], [place]  Fellowship  [address]                                               [place] [facility] Resources: [place] [phone]               General Therapy                                                Domenic Schwab, PhD        [address] [place], [place] [address]         [facility]   [address] [place], [place] [address] [phone]  [facility] Recovery [address] [place], [place] [address] [phone] Insurance/Medicaid/sponsorship through [facility] and Families                                              [address] [place]                                         [place], [place] [address]    Therapy/tele-psych/case         [place] [address], [place]  [address]  Adolescent/group home/case management [phone]                                           Rosette Reveal PhD       General therapy       Insurance   [phone]         Dr. Adele Schilder, Insurance, M-F [phone]  [facility] [place] [facility] Dept. [address]         [place] Phone:  [phone]                                  Phone:  [phone]  Phone:  [place], [place] in [place], [address]             [phone], Insurance

## 2019-07-14 NOTE — Progress Notes (Signed)
Name: Madeline Dominguez   MRN: [id number]    DOB: [birth year]   Date:07/14/2019       Progress Note  Chief Complaint  Patient presents with  . Anxiety    She thinks that her Zoloft needs to be adjusted.  . Depression    Has had recent death in her family  . Flank Pain    Right flank pain x 2 weeks. Sharp and constant     Subjective:   Madeline Dominguez is a 54 y.o. female, presents to clinic for anxiety, HTN, flank pain She is feeling down due to death in family, she wants zoloft adjusted.  She is taking 75 mg, having sadness, crying, anxiety  Flank pain to right side/back, sharp and intermittent, no alleviating or aggravating factors.  Does not change with movement/positional changes, eating.  No urinary sx.  Denies cough, SOB, CP.  No change in appetite, she notes a little nausea  She wants to adjust zoloft dose, she is experiencing a lot of anxiety about everything she needs to do and make sure is okay     Current Outpatient Medications:  .  amLODipine-valsartan (EXFORGE) 5-160 MG tablet, Take 1 tablet by mouth once daily, Disp: 90 tablet, Rfl: 0 .  baclofen (LIORESAL) 10 MG tablet, Take 1 tablet (10 mg total) by mouth 3 (three) times daily as needed for muscle spasms., Disp: 30 each, Rfl: 0 .  ferrous sulfate 325 (65 FE) MG tablet, Take 1 tablet (325 mg total) by mouth 2 (two) times daily with a meal., Disp: 180 tablet, Rfl: 1 .  meloxicam (MOBIC) 15 MG tablet, Take 1 tablet (15 mg total) by mouth daily., Disp: 90 tablet, Rfl: 0 .  omeprazole (PRILOSEC) 40 MG capsule, Take 1 capsule (40 mg total) by mouth daily. (Patient taking differently: Take 40 mg by mouth daily as needed. ), Disp: 30 capsule, Rfl: 3 .  sertraline (ZOLOFT) 50 MG tablet, Take 1.5 tablets (75 mg total) by mouth daily., Disp: 135 tablet, Rfl: 1 .  vitamin B-12 1000 MCG tablet, Take 1 tablet (1,000 mcg total) by mouth daily., Disp: 30 tablet, Rfl: 0  Patient Active Problem List   Diagnosis Date Noted  .  Dilated cardiomyopathy ([place]) 05/12/2019  . Mild major depression ([place]) 05/12/2019  . Stress incontinence in female 03/25/2017  . History of deep venous thrombosis (DVT) of distal vein of right lower extremity 01/18/2017  . Adrenal adenoma, left 09/11/2016  . Current moderate episode of major depressive disorder without prior episode ([place]) 08/10/2016  . 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 History of radioactive iodine thyroid ablation   . 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 Iron deficiency anemia 07/27/2015  . B12 deficiency 07/27/2015  . History of pulmonary embolus (PE) 07/25/2015  . Adrenal nodule ([place]) 07/25/2015  . Pulmonary nodules/lesions, multiple 07/25/2015  . OSA (obstructive sleep apnea) 05/18/2015  . Thyroid nodule 12/17/2014  . Heavy menstrual period 09/02/2014  . Gastroesophageal reflux disease without esophagitis 09/02/2014  . Morbid obesity, unspecified obesity type ([place]) 06/22/2014  . Large breasts 06/22/2014  . Anemia, iron deficiency 06/22/2014  . H/O hyperthyroidism 06/22/2014  . Right ventricular dilation 06/22/2014  . 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    Past Surgical History:  Procedure Laterality Date  . CESAREAN SECTION     26 years ago - repaired bleeding after vaginal birth    Family History  Problem Relation Age  of Onset  . Hypertension Mother   . CVA Father   . Deep vein thrombosis Brother   . Pulmonary embolism Brother   . Anemia Sister     Social History   Tobacco Use  . Smoking status: Never Smoker  . Smokeless tobacco: Never Used  Vaping Use  . Vaping Use: Never used  Substance Use Topics  . Alcohol use: No    Alcohol/week: 0.0 standard drinks    Comment: rarely  . Drug use: No     Allergies  Allergen Reactions  . Aspirin Nausea And Vomiting  . Hctz [Hydrochlorothiazide] Other (See Comments)    Nausea, cramping  .  Tomato Itching and Swelling    Health Maintenance  Topic Date Due  . Hepatitis C Screening  Never done  . COVID-19 Vaccine (1) Never done  . COLONOSCOPY  Never done  . MAMMOGRAM  06/01/2017  . INFLUENZA VACCINE  08/02/2019  . PAP SMEAR-Modifier  03/25/2020  . TETANUS/TDAP  09/01/2024  . HIV Screening  Completed    Chart Review Today: I personally reviewed active problem list, medication list, allergies, family history, social history, health maintenance, notes from last encounter, lab results, imaging with the patient/caregiver today.   Review of Systems  10 Systems reviewed and are negative for acute change except as noted in the HPI.  Objective:   Vitals:   07/14/19 1506 07/14/19 1507  BP: (!) 140/100 (!) 138/96  Pulse: 86   Resp: 16   Temp: (!) 97.3 F (36.3 C)   TempSrc: Temporal   SpO2: 94%   Weight: (!) 387 lb (175.5 kg)   Height: 5\' 7"  (1.702 m)     Body mass index is 60.61 kg/m.  Physical Exam Vitals and nursing note reviewed.  Constitutional:      General: She is not in acute distress.    Appearance: Normal appearance. She is well-developed. She is not ill-appearing, toxic-appearing or diaphoretic.     Interventions: Face mask in place.  HENT:     Head: Normocephalic and atraumatic.     Right Ear: External ear normal.     Left Ear: External ear normal.  Eyes:     General: Lids are normal. No scleral icterus.       Right eye: No discharge.        Left eye: No discharge.     Conjunctiva/sclera: Conjunctivae normal.  Neck:     Trachea: Phonation normal. No tracheal deviation.  Cardiovascular:     Rate and Rhythm: Normal rate and regular rhythm.     Pulses: Normal pulses.          Radial pulses are 2+ on the right side and 2+ on the left side.       Posterior tibial pulses are 2+ on the right side and 2+ on the left side.     Heart sounds: Normal heart sounds. No murmur heard.  No friction rub. No gallop.   Pulmonary:     Effort: Pulmonary effort  is normal. No respiratory distress.     Breath sounds: Normal breath sounds. No stridor. No wheezing, rhonchi or rales.  Chest:     Chest wall: No deformity, swelling, tenderness, crepitus or edema.  Abdominal:     General: Bowel sounds are normal. There is no distension.     Palpations: Abdomen is soft.     Tenderness: There is no abdominal tenderness. There is no right CVA tenderness, left CVA tenderness, guarding or rebound.  Musculoskeletal:  General: No deformity. Normal range of motion.     Cervical back: Normal range of motion and neck supple.     Right lower leg: No edema.     Left lower leg: No edema.  Lymphadenopathy:     Cervical: No cervical adenopathy.  Skin:    General: Skin is warm and dry.     Capillary Refill: Capillary refill takes less than 2 seconds.     Coloration: Skin is not jaundiced or pale.     Findings: No rash.  Neurological:     Mental Status: She is alert and oriented to person, place, and time.     Motor: No abnormal muscle tone.     Gait: Gait normal.  Psychiatric:        Mood and Affect: Mood is anxious. Affect is tearful.        Speech: Speech normal.        Behavior: Behavior normal.         Assessment & Plan:     ICD-10-CM   1. Mild major depression (HCC)  F32.0 busPIRone (BUSPAR) 5 MG tablet   recent death in family, sadness/depression recently worse with death, she has increased zoloft dose, some worse anxiety  2. Anxiety  F41.9 busPIRone (BUSPAR) 5 MG tablet   pt anxious and stressed and appropriately grieving - we'll leave zoloft dose the same and add buspar, encouraged grief counseling, offered resources  3. Nausea  R11.0 ondansetron (ZOFRAN ODT) 4 MG disintegrating tablet   some mild nausea, no abd tttp, zofran prn, f/up if any change or worsening    F/up in the next 2-3 months with PCP or sooner as needed  BP elevated, slightly better with rechecking, however is very anxious today - continue to monitor, f/up if elevated  at home when at rest and relaxed   Delsa Grana, PA-C 07/14/19 3:30 PM

## 2019-07-31 ENCOUNTER — Encounter: Payer: Self-pay | Admitting: Family Medicine

## 2019-08-05 NOTE — Progress Notes (Signed)
Virtual Visit via Video Note  I connected with Madeline Dominguez on 08/11/19 at 10:50 AM EDT by a video enabled telemedicine application and verified that I am speaking with the correct person using two identifiers.   I discussed the limitations of evaluation and management by telemedicine and the availability of in person appointments. The patient expressed understanding and agreed to proceed.     I discussed the assessment and treatment plan with the patient. The patient was provided an opportunity to ask questions and all were answered. The patient agreed with the plan and demonstrated an understanding of the instructions.   The patient was advised to call back or seek an in-person evaluation if the symptoms worsen or if the condition fails to improve as anticipated.  Location: patient- home, provider- home office   I provided 42 minutes of non-face-to-face time during this encounter.   [name], MD     Psychiatric Initial Adult Assessment   Patient Identification: Madeline Dominguez MRN:  [id number] Date of Evaluation:  08/11/2019 Referral Source: Steele Sizer, MD Chief Complaint:   Chief Complaint    Depression; Establish Care     Visit Diagnosis:    ICD-10-CM   1. MDD (major depressive disorder), recurrent episode, mild (HCC)  F33.0     History of Present Illness:   Madeline Dominguez is a 54 y.o. year old female with a history of depression, hypertension, dilated cardiomyopathy,adrenal adenoma (undergoing evaluation),bilateral pulmonary embolism, obesity, obstructive sleep apnea on CPAP, history of toxic multinodular goiter , who is referred for depression.   She states that there was a setback a few weeks ago.  Her husband's grandmother deceased.  She was close to her.  She also states that her brother's mother-in-law deceased.  Although she still has grief for them, she is now concerned how it affects other people.  She states that her husband was raised by his  grandmother, and she has not seen him cry it.  She also states that her mother was close with her brother's mother-in-law.  She visits her mother whenever she can.  She states that her husband is doing well.  Although he has history of substance use, he has not used it lately.  Although she is concerned about her husband when he goes to [place] to visit his mother given his history of substance use, she believes he has not used them for a while.  He is taking classes to become a truck driver.  She believes that he is trying to improve and motivated.  Although she has been unemployed, she is not thinking of activity getting a job with the hope that her husband will have a job.  Although she was having dizziness, GI symptoms, worsening in fatigue, decreased appetite, it has been getting a little better after taking higher dose of sertraline.  She wonders if there is any additional recommendations in terms of her treatment.   She has insomnia/hypersomnia.  She feels fatigue.  Her appetite has been increased back to normal.  She has fair concentration.  She has mild anhedonia.  She denies SI.  She feels anxious when she talks with her friend as she used to be the one to take care of other people.  She denies panic attacks.  She denies alcohol use or drug use.   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 Her husband was incarcerated in 01/2016  for robbery (violated parol). She was a single parent for 25 years.  Marital status: married  Number of children: 3, age 94, 85, 24  Lost 10 pounds over 2.5 weeks  Wt Readings from Last 3 Encounters:  07/14/19 (!) 387 lb (175.5 kg)  12/02/18 (!) 365 lb (165.6 kg)  01/24/18 (!) 365 lb 14.4 oz (166 kg)   Medication- sertraline 100 mg daily for two  weeks  Associated Signs/Symptoms: Depression Symptoms:  depressed mood, anhedonia, insomnia, hypersomnia, fatigue, decreased appetite, (Hypo) Manic Symptoms:  denies decreased need for sleep, euphoria Anxiety Symptoms:  Excessive Worry, Psychotic Symptoms:  denies AH, VH PTSD Symptoms: Negative  Past Psychiatric History:  Outpatient: used to be seen until 2019 for depression Psychiatry admission: denies Previous suicide attempt: denies  Past trials of medication: sertraline History of violence: denies   Previous Psychotropic Medications: Yes   Substance Abuse History in the last 12 months:  No.  Consequences of Substance Abuse: NA  Past Medical History:  Past Medical History:  Diagnosis Date  . Adrenal adenoma    a. normal cortisol and aldosterone levels  . Cough 07/25/2015  . Dilated cardiomyopathy ([place])    a. TTE 2/15: EF of 55-60%, dilated LV & LA, and nl RV; b. TTE 6/16:EF 55-60%, no RWMA, nl LV dias fxn, LV cavity size nl with mild concentric LVH, mildly dilated ascending aorta, LA nl size, RV sys fxn nl, PASP nl; c. TTE 7/17: EF 45-50%, unable to exclude RWMA, nl LV dias faxn, mildly dilated RV w/ nl sys fxn, PASP nl; d. TTE 7/18: EF 45-50%, mild LVH, Gr1DD, mildly dilated LA, mildly dilated RV  . Goiter    a. s/p radioactive iodine treatment in 2010  . Heart palpitations   . Hypertension   . Iron deficiency   . Large breasts   . Morbid obesity ([place])   . Postpartum hemorrhage   . Pulmonary embolism ([place]) 07/2015   a. s/p Eliquis; b. hypercoag work up neg; c. felt to be 2/2 OCP    Past Surgical History:  Procedure Laterality Date  . CESAREAN SECTION     26 years ago - repaired bleeding after vaginal birth    Family Psychiatric History:  As below  Family History:  Family History  Problem Relation Age of Onset  . Hypertension Mother   . CVA Father   . Deep vein thrombosis Brother   . Pulmonary embolism Brother   . Anemia Sister     Social History:    Social History   Socioeconomic History  . Marital status: Married    Spouse name: Not on file  . Number of children: 3  . Years of education: Not on file  . Highest education level: High school graduate  Occupational History  . Not on file  Tobacco Use  . Smoking status: Never Smoker  . Smokeless tobacco: Never Used  Vaping Use  . Vaping Use: Never used  Substance and Sexual Activity  . Alcohol use: No    Alcohol/week: 0.0 standard drinks    Comment: rarely  . Drug use: No  . Sexual activity: Not Currently    Partners: Male    Birth control/protection: Condom  Other Topics Concern  . Not on file  Social History Narrative   Lives with 3 with 3 daughters, husband was incarcerated April 2018 - for stealing, she has  not been keeping in touch with him because she is disappointed. 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 Difficulty of Paying Living Expenses:   Food Insecurity:   . Worried About Charity fundraiser in the Last Year:   . Arboriculturist in the Last Year:   Transportation Needs:   . Film/video editor (Medical):   Marland Kitchen Lack of Transportation (Non-Medical):   Physical Activity:   . Days of Exercise per Week:   . Minutes of Exercise per Session:   Stress:   . Feeling of Stress :   Social Connections:   . Frequency of Communication with Friends and Family:   . Frequency of Social Gatherings with Friends and Family:   . Attends Religious Services:   . Active Member of Clubs or Organizations:   . 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 Her  husband was incarcerated in 01/2016  for robbery (violated parol). She was a single parent for 25 years.  Marital status: married  Number of children: 3, age 61, 61, 29  Allergies:   Allergies  Allergen Reactions  . Aspirin Nausea And Vomiting  . Hctz [Hydrochlorothiazide] Other (See Comments)    Nausea, cramping  . Tomato Itching and Swelling    Metabolic Disorder Labs: Lab Results  Component Value Date   HGBA1C 5.2 05/12/2019   MPG 103 05/12/2019   MPG 97 01/24/2018   No results found for: PROLACTIN Lab Results  Component Value Date   CHOL 201 (H) 05/12/2019   TRIG 72 05/12/2019   HDL 76 05/12/2019   CHOLHDL 2.6 05/12/2019   VLDL 11 04/27/2016   LDLCALC 109 (H) 05/12/2019   LDLCALC 124 (H) 01/24/2018   Lab Results  Component Value Date   TSH 7.71 (H) 05/12/2019    Therapeutic Level Labs: No results found for: LITHIUM No results found for: CBMZ No results found for: VALPROATE  Current Medications: Current Outpatient Medications  Medication Sig Dispense Refill  . amLODipine-valsartan (EXFORGE) 5-160 MG tablet Take 1 tablet by mouth once daily 90 tablet 0  . baclofen (LIORESAL) 10 MG tablet Take 1 tablet (10 mg total) by mouth 3 (three) times daily as needed for muscle spasms. 30 each 0  . busPIRone (BUSPAR) 5 MG tablet Take 1-2 tablets (5-10 mg total) by mouth 3 (three) times daily as needed. (Patient not taking: Reported on 08/11/2019) 60 tablet 2  . ferrous sulfate 325 (65 FE) MG tablet Take 1 tablet (325 mg total) by mouth 2 (two) times daily with a meal. 180 tablet 1  . meloxicam (MOBIC) 15 MG tablet Take 1 tablet by mouth once daily 90 tablet 1  . ondansetron (ZOFRAN ODT) 4 MG disintegrating tablet Take 1 tablet (4 mg total) by mouth every 8 (eight) hours as needed for nausea or vomiting. 10 tablet 0  . pantoprazole (PROTONIX) 20 MG tablet Take 1 tablet (20 mg total) by mouth every morning. One hour before breakfast 30 tablet 1  . sertraline (ZOLOFT) 100 MG  tablet Take 1 tablet (100 mg total) by mouth daily. 30 tablet 2  . sertraline (ZOLOFT) 50 MG tablet Take 1.5 tablets (75 mg total) by mouth daily. 135 tablet 1  . vitamin B-12 1000 MCG tablet Take 1 tablet (1,000 mcg total) by mouth daily. 30 tablet 0   No current facility-administered medications for this visit.    Musculoskeletal: Strength & Muscle Tone: N/A Gait & Station: N/A Patient leans: N/A  Psychiatric Specialty Exam: Review of Systems  Psychiatric/Behavioral: Positive for dysphoric mood and sleep disturbance. Negative for agitation, behavioral problems, confusion, decreased concentration, hallucinations, self-injury and suicidal ideas. The patient is nervous/anxious. The patient is not hyperactive.   All other systems reviewed and are negative.   There were no vitals taken for this visit.There is no height or weight on file to calculate BMI.  General Appearance: Fairly Groomed  Eye Contact:  Good  Speech:  Clear and Coherent  Volume:  Normal  Mood:  Depressed  Affect:  Appropriate, Congruent and Restricted  Thought Process:  Coherent  Orientation:  Full (Time, Place, and Person)  Thought Content:  Logical  Suicidal Thoughts:  No  Homicidal Thoughts:  No  Memory:  Immediate;   Good  Judgement:  Good  Insight:  Fair  Psychomotor Activity:  Normal  Concentration:  Concentration: Good and Attention Span: Good  Recall:  Good  Fund of Knowledge:Good  Language: Good  Akathisia:  No  Handed:  Right  AIMS (if indicated):  not done  Assets:  Communication Skills Desire for Improvement  ADL's:  Intact  Cognition: WNL  Sleep:  Poor   Screenings: GAD-7     [facility] Visit from 07/22/2017 in [facility]  Total GAD-7 Score 3    PHQ2-9     Office Visit from 07/14/2019 in [facility] Office Visit from 05/12/2019 in [facility] Office Visit from 12/02/2018 in [facility] Office Visit from 04/25/2018 in  [facility] Office Visit from 04/02/2018 in [facility]  PHQ-2 Total Score 2 1 1  0 0  PHQ-9 Total Score 9 1 4  0 0      Assessment and Plan:  Madeline Dominguez is a 54 y.o. year old female with a history of depression, hypertension, dilated cardiomyopathy,adrenal adenoma (undergoing evaluation),bilateral pulmonary embolism, obesity, obstructive sleep apnea on CPAP, history of toxic multinodular goiter , who is referred for depression.   1. MDD (major depressive disorder), recurrent episode, mild ([place]) She had a relapse in depressive symptoms in the context of recent loss of her extended family members.  She uptitrated sertraline for the past 2 weeks, and reports improvement in her mood symptoms.  Will continue current dose of sertraline to see if it exerts full benefit for depression.  We will discontinue BuSpar at this time.  Coached behavioral activation.   Plan 1. Continue sertraline 100 mg at night 2. Discontinue Buspar  3. Next appointment 10/12 at 72 AM for 30 mins, video - TSH will be rechecked in September by her PCP  The patient demonstrates the following risk factors for suicide: Chronic risk factors for suicide include: psychiatric disorder of depression. Acute risk factors for suicide include: unemployment and loss (financial, interpersonal, professional). Protective factors for this patient include: positive social support and hope for the future. Considering these factors, the overall suicide risk at this point appears to be low. Patient is appropriate for outpatient follow up.   [name], MD 8/10/202111:40  AM

## 2019-08-06 ENCOUNTER — Other Ambulatory Visit: Payer: Self-pay | Admitting: Family Medicine

## 2019-08-06 DIAGNOSIS — M170 Bilateral primary osteoarthritis of knee: Secondary | ICD-10-CM

## 2019-08-11 ENCOUNTER — Other Ambulatory Visit: Payer: Self-pay

## 2019-08-11 ENCOUNTER — Telehealth (INDEPENDENT_AMBULATORY_CARE_PROVIDER_SITE_OTHER): Payer: Medicaid Other | Admitting: Psychiatry

## 2019-08-11 ENCOUNTER — Encounter (HOSPITAL_COMMUNITY): Payer: Self-pay | Admitting: Psychiatry

## 2019-08-11 DIAGNOSIS — F330 Major depressive disorder, recurrent, mild: Secondary | ICD-10-CM

## 2019-08-11 MED ORDER — SERTRALINE HCL 100 MG PO TABS: 100.0000 mg | ORAL_TABLET | Freq: Every day | ORAL | 2 refills | Status: DC

## 2019-08-20 ENCOUNTER — Other Ambulatory Visit: Payer: Self-pay | Admitting: Family Medicine

## 2019-08-20 DIAGNOSIS — R7989 Other specified abnormal findings of blood chemistry: Secondary | ICD-10-CM

## 2019-08-24 DIAGNOSIS — R7989 Other specified abnormal findings of blood chemistry: Secondary | ICD-10-CM | POA: Diagnosis not present

## 2019-08-24 LAB — TSH: TSH: 4.72 mIU/L — ABNORMAL HIGH

## 2019-08-25 ENCOUNTER — Encounter: Payer: Self-pay | Admitting: Family Medicine

## 2019-08-26 ENCOUNTER — Other Ambulatory Visit: Payer: Self-pay | Admitting: Family Medicine

## 2019-08-26 DIAGNOSIS — E039 Hypothyroidism, unspecified: Secondary | ICD-10-CM

## 2019-08-26 MED ORDER — LEVOTHYROXINE SODIUM 25 MCG PO TABS: 25.0000 ug | ORAL_TABLET | ORAL | 1 refills | Status: DC

## 2019-09-03 ENCOUNTER — Telehealth (HOSPITAL_COMMUNITY): Payer: Self-pay | Admitting: *Deleted

## 2019-09-03 NOTE — Telephone Encounter (Signed)
Advise her to have sooner appointment to discuss this given it has been almost a month since the last time I saw her.

## 2019-09-03 NOTE — Telephone Encounter (Signed)
Patient called and stated that her Zoloft increase had made her feel somewhat better at first but she doesn't feel it is working well enough and is requesting another increase. Please review and advise.

## 2019-09-17 DIAGNOSIS — E042 Nontoxic multinodular goiter: Secondary | ICD-10-CM | POA: Diagnosis not present

## 2019-09-17 DIAGNOSIS — Z6841 Body Mass Index (BMI) 40.0 and over, adult: Secondary | ICD-10-CM | POA: Diagnosis not present

## 2019-09-17 DIAGNOSIS — D3502 Benign neoplasm of left adrenal gland: Secondary | ICD-10-CM | POA: Diagnosis not present

## 2019-10-01 ENCOUNTER — Other Ambulatory Visit: Payer: Self-pay | Admitting: Family Medicine

## 2019-10-01 DIAGNOSIS — I10 Essential (primary) hypertension: Secondary | ICD-10-CM

## 2019-10-07 ENCOUNTER — Encounter: Payer: Self-pay | Admitting: Family Medicine

## 2019-10-07 NOTE — Progress Notes (Signed)
Virtual Visit via Video Note  I connected with Madeline Dominguez on 10/13/19 at 11:00 AM EDT by a video enabled telemedicine application and verified that I am speaking with the correct person using two identifiers.   I discussed the limitations of evaluation and management by telemedicine and the availability of in person appointments. The patient expressed understanding and agreed to proceed.    I discussed the assessment and treatment plan with the patient. The patient was provided an opportunity to ask questions and all were answered. The patient agreed with the plan and demonstrated an understanding of the instructions.   The patient was advised to call back or seek an in-person evaluation if the symptoms worsen or if the condition fails to improve as anticipated.  Location: patient- home, provider- home office   I provided 14 minutes of non-face-to-face time during this encounter.   [name], MD    [facility] MD/PA/NP OP Progress Note  10/13/2019 11:25 AM Madeline Dominguez  MRN:  [id number]  Chief Complaint:  Chief Complaint    Depression; Follow-up     HPI:  - PCP increased sertraline 125 mg daily for two months   This is a follow-up appointment for depression.  She states that she has been doing better.  She attributes it to recent increase of sertraline, levothyroxine, and being on HRT.  She has been more active, going outside with her family.  She believes that everything is good.  Although she still misses her deceased family members, she believes she has been handling the loss well.  Her husband is rarely at home due to his work, although she reports fair relationship. She has fair sleep. She has difficulty in concentration. She has good appetite. She feels good about weight loss, which she attributes to being more active. She denies SI.   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 Her husband was incarcerated in 01/2016 for robbery (violated parol). She was a single parent for 25 years.  Marital status: married  Number of children: 3, age 79, 50, 18  Visit Diagnosis:    ICD-10-CM   1. MDD (major depressive disorder), recurrent episode, mild ([place])  F33.0     Past Psychiatric History: Please see initial evaluation for full details. I have reviewed the history. No updates at this time.     Past Medical History:  Past Medical History:  Diagnosis Date  . Adrenal adenoma    a. normal cortisol and aldosterone levels  . Cough 07/25/2015  . Dilated cardiomyopathy ([place])    a. TTE 2/15: EF of 55-60%, dilated LV & LA, and nl RV; b. TTE 6/16:EF 55-60%, no RWMA, nl LV dias fxn, LV cavity size nl with mild concentric LVH, mildly dilated ascending aorta, LA nl size, RV sys fxn nl, PASP nl; c. TTE 7/17: EF 45-50%, unable to exclude RWMA, nl LV dias faxn, mildly dilated RV w/ nl sys fxn, PASP nl; d. TTE 7/18: EF 45-50%, mild LVH, Gr1DD, mildly dilated LA, mildly dilated RV  . Goiter    a. s/p radioactive iodine treatment in 2010  . Heart palpitations   . Hypertension   . Iron deficiency   . Large breasts   . Morbid obesity ([place])   . Postpartum hemorrhage   . Pulmonary embolism ([place])  07/2015   a. s/p Eliquis; b. hypercoag work up neg; c. felt to be 2/2 OCP    Past Surgical History:  Procedure Laterality Date  . CESAREAN SECTION     26 years ago - repaired bleeding after vaginal birth    Family Psychiatric History: Please see initial evaluation for full details. I have reviewed the history. No updates at this time.     Family History:  Family History  Problem Relation Age of Onset  . Hypertension Mother   . CVA Father   . Deep vein thrombosis Brother   . Pulmonary embolism Brother   . Anemia Sister     Social History:  Social History   Socioeconomic  History  . Marital status: Married    Spouse name: Not on file  . Number of children: 3  . Years of education: Not on file  . Highest education level: High school graduate  Occupational History  . Not on file  Tobacco Use  . Smoking status: Never Smoker  . Smokeless tobacco: Never Used  Vaping Use  . Vaping Use: Never used  Substance and Sexual Activity  . Alcohol use: No    Alcohol/week: 0.0 standard drinks    Comment: rarely  . Drug use: No  . Sexual activity: Not Currently    Partners: Male    Birth control/protection: Condom  Other Topics Concern  . Not on file  Social History Narrative   Lives with 3 with 3 daughters, husband was incarcerated April 2018 - for stealing, she has not been keeping in touch with him because she is disappointed. 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 Difficulty of Paying Living Expenses: Not on file  Food Insecurity:   . Worried About Charity fundraiser in the Last Year: Not on file  . Ran Out of Food in the Last Year: Not on file  Transportation Needs:   . Lack of Transportation (Medical): Not on file  . Lack of Transportation (Non-Medical): Not on file  Physical Activity:   . Days of Exercise per Week: Not on file  . Minutes of Exercise per Session: Not on file  Stress:   . Feeling of Stress : Not on file  Social Connections:   . Frequency of Communication with Friends and Family: Not on file  . Frequency of Social Gatherings with Friends and Family: Not on file  . Attends Religious Services: Not on file  . Active Member of Clubs or Organizations: Not on file  . Attends Archivist Meetings: Not on file  . Marital Status: Not on file    Allergies:  Allergies  Allergen Reactions  . Aspirin Nausea And Vomiting  . Hctz  [Hydrochlorothiazide] Other (See Comments)    Nausea, cramping  . Tomato Itching and Swelling    Metabolic Disorder Labs: Lab Results  Component Value Date   HGBA1C 5.2 05/12/2019   MPG 103 05/12/2019   MPG 97 01/24/2018   No results found for: PROLACTIN Lab Results  Component Value Date   CHOL 201 (H) 05/12/2019   TRIG 72 05/12/2019   HDL 76 05/12/2019   CHOLHDL 2.6 05/12/2019   VLDL 11 04/27/2016   LDLCALC 109 (H) 05/12/2019   LDLCALC 124 (H)  01/24/2018   Lab Results  Component Value Date   TSH 4.72 (H) 08/24/2019   TSH 7.71 (H) 05/12/2019    Therapeutic Level Labs: No results found for: LITHIUM No results found for: VALPROATE No components found for:  CBMZ  Current Medications: Current Outpatient Medications  Medication Sig Dispense Refill  . amLODipine-valsartan (EXFORGE) 5-160 MG tablet Take 1 tablet by mouth once daily 90 tablet 0  . baclofen (LIORESAL) 10 MG tablet Take 1 tablet (10 mg total) by mouth 3 (three) times daily as needed for muscle spasms. 30 each 0  . busPIRone (BUSPAR) 5 MG tablet Take 1-2 tablets (5-10 mg total) by mouth 3 (three) times daily as needed. (Patient not taking: Reported on 08/11/2019) 60 tablet 2  . ferrous sulfate 325 (65 FE) MG tablet Take 1 tablet (325 mg total) by mouth 2 (two) times daily with a meal. 180 tablet 1  . levothyroxine (SYNTHROID) 25 MCG tablet Take 1 tablet (25 mcg total) by mouth 3 (three) times a week. 12 tablet 1  . meloxicam (MOBIC) 15 MG tablet Take 1 tablet by mouth once daily 90 tablet 1  . ondansetron (ZOFRAN ODT) 4 MG disintegrating tablet Take 1 tablet (4 mg total) by mouth every 8 (eight) hours as needed for nausea or vomiting. 10 tablet 0  . pantoprazole (PROTONIX) 20 MG tablet Take 1 tablet (20 mg total) by mouth every morning. One hour before breakfast 30 tablet 1  . sertraline (ZOLOFT) 100 MG tablet 125 mg daily. Take along with 25 mg. 30 tablet 1  . sertraline (ZOLOFT) 50 MG tablet Take 1.5 tablets (75  mg total) by mouth daily. 135 tablet 1  . vitamin B-12 1000 MCG tablet Take 1 tablet (1,000 mcg total) by mouth daily. 30 tablet 0   No current facility-administered medications for this visit.     Musculoskeletal: Strength & Muscle Tone: N/A Gait & Station: N/A Patient leans: N/A  Psychiatric Specialty Exam: Review of Systems  Psychiatric/Behavioral: Positive for dysphoric mood. Negative for agitation, behavioral problems, confusion, decreased concentration, hallucinations, self-injury, sleep disturbance and suicidal ideas. The patient is nervous/anxious. The patient is not hyperactive.   All other systems reviewed and are negative.   There were no vitals taken for this visit.There is no height or weight on file to calculate BMI.  General Appearance: Fairly Groomed  Eye Contact:  Good  Speech:  Clear and Coherent  Volume:  Normal  Mood:  "better"  Affect:  Appropriate, Congruent and slightly restricted  Thought Process:  Coherent  Orientation:  Full (Time, Place, and Person)  Thought Content: Logical   Suicidal Thoughts:  No  Homicidal Thoughts:  No  Memory:  Immediate;   Good  Judgement:  Good  Insight:  Fair  Psychomotor Activity:  Normal  Concentration:  Concentration: Good and Attention Span: Good  Recall:  Good  Fund of Knowledge: Good  Language: Good  Akathisia:  No  Handed:  Right  AIMS (if indicated): not done  Assets:  Communication Skills  ADL's:  Intact  Cognition: WNL  Sleep:  Good   Screenings: GAD-7     Office Visit from 07/22/2017 in [facility]  Total GAD-7 Score 3    PHQ2-9     Office Visit from 07/14/2019 in [facility] Office Visit from 05/12/2019 in [facility] Office Visit from 12/02/2018 in [facility] Office Visit from 04[facility]25/2018 in [facility] Office Visit from 04/02/2018 in  [facility]  PHQ-2 Total Score 2 1 1  0 0  PHQ-9 Total  Score 9 1 4  0 0       Assessment and Plan:  Madeline Dominguez is a 54 y.o. year old female with a history of  depression, hypertension, dilated cardiomyopathy,adrenal adenoma (undergoing evaluation),bilateral pulmonary embolism, obesity, obstructive sleep apnea on CPAP, history of toxic multinodular goiter, who presents for follow up appointment for below.   1. MDD (major depressive disorder), recurrent episode, mild ([place]) Exam is notable for brighter affect, and she reports improvement in depressive symptoms since uptitration of sertraline. Psychosocial stressors includes recent loss of her extended family members.  Will continue current dose of sertraline to target depression.  She is interested in therapy; will make referral.   Plan 1. Continue sertraline 125 mg at night 2. Referral to therapy  3. Next appointment 12/7 at 2:30 for 30 mins, video - TSH will be rechecked in September by her PCP  The patient demonstrates the following risk factors for suicide: Chronic risk factors for suicide include: psychiatric disorder of depression. Acute risk factors for suicide include: unemployment and loss (financial, interpersonal, professional). Protective factors for this patient include: positive social support and hope for the future. Considering these factors, the overall suicide risk at this point appears to be low. Patient is appropriate for outpatient follow up.     [name], MD 10/13/2019, 11:25 AM

## 2019-10-13 ENCOUNTER — Telehealth (INDEPENDENT_AMBULATORY_CARE_PROVIDER_SITE_OTHER): Payer: Medicaid Other | Admitting: Psychiatry

## 2019-10-13 ENCOUNTER — Other Ambulatory Visit: Payer: Self-pay

## 2019-10-13 ENCOUNTER — Encounter (HOSPITAL_COMMUNITY): Payer: Self-pay | Admitting: Psychiatry

## 2019-10-13 DIAGNOSIS — F330 Major depressive disorder, recurrent, mild: Secondary | ICD-10-CM

## 2019-10-13 MED ORDER — SERTRALINE HCL 100 MG PO TABS: ORAL_TABLET | ORAL | 1 refills | Status: DC

## 2019-10-13 NOTE — Patient Instructions (Signed)
1. Continue sertraline 125 mg at night 2. Referral to therapy  3. Next appointment 12/7 at 2:30

## 2019-10-22 ENCOUNTER — Ambulatory Visit (INDEPENDENT_AMBULATORY_CARE_PROVIDER_SITE_OTHER): Payer: Medicaid Other | Admitting: Clinical

## 2019-10-22 ENCOUNTER — Other Ambulatory Visit: Payer: Self-pay

## 2019-10-22 DIAGNOSIS — F330 Major depressive disorder, recurrent, mild: Secondary | ICD-10-CM

## 2019-10-22 NOTE — Progress Notes (Signed)
Virtual Visit via Video Note  I connected with Madeline Dominguez on 10/22/19 at  1:00 PM EDT by a video enabled telemedicine application and verified that I am speaking with the correct person using two identifiers.  Location: Patient: Home Provider: Office   I discussed the limitations of evaluation and management by telemedicine and the availability of in person appointments. The patient expressed understanding and agreed to proceed.     Comprehensive Clinical Assessment (CCA) Note  10/22/2019 Madeline Dominguez [id number]  Visit Diagnosis:      ICD-10-CM   1. MDD (major depressive disorder), recurrent episode, mild (HCC)  F33.0       CCA Screening, Triage and Referral (STR)  Patient Reported Information How did you hear about [place]? No data recorded Referral name: No data recorded Referral phone number: No data recorded  Whom do you see for routine medical problems? No data recorded Practice/Facility Name: No data recorded Practice/Facility Phone Number: No data recorded Name of Contact: No data recorded Contact Number: No data recorded Contact Fax Number: No data recorded Prescriber Name: No data recorded Prescriber Address (if known): No data recorded  What Is the Reason for Your Visit/Call Today? No data recorded How Long Has This Been Causing You Problems? No data recorded What Do You Feel Would Help You the Most Today? No data recorded  Have You Recently Been in Any Inpatient Treatment (Hospital/Detox/Crisis Center/28-Day Program)? No data recorded Name/Location of Program/Hospital:No data recorded How Long Were You There? No data recorded When Were You Discharged? No data recorded  Have You Ever Received Services From [facility] Before? No data recorded Who Do You See at [facility]? No data recorded  Have You Recently Had Any Thoughts About Hurting Yourself? No data recorded Are You Planning to Commit Suicide/Harm Yourself At This time? No data  recorded  Have you Recently Had Thoughts About [place]? No data recorded Explanation: No data recorded  Have You Used Any Alcohol or Drugs in the Past 24 Hours? No data recorded How Long Ago Did You Use Drugs or Alcohol? No data recorded What Did You Use and How Much? No data recorded  Do You Currently Have a Therapist/Psychiatrist? No data recorded Name of Therapist/Psychiatrist: No data recorded  Have You Been Recently Discharged From Any Office Practice or Programs? No data recorded Explanation of Discharge From Practice/Program: No data recorded    CCA Screening Triage Referral Assessment Type of Contact: No data recorded Is this Initial or Reassessment? No data recorded Date Telepsych consult ordered in [facility]:  No data recorded Time Telepsych consult ordered in [facility]:  No data recorded  Patient Reported Information Reviewed? No data recorded Patient Left Without Being Seen? No data recorded Reason for Not Completing Assessment: No data recorded  Collateral Involvement: No data recorded  Does Patient Have a [place]? No data recorded Name and Contact of Legal Guardian: No data recorded If Minor and Not Living with Parent(s), Who has Custody? No data recorded Is CPS involved or ever been involved? No data recorded Is APS involved or ever been involved? No data recorded  Patient Determined To Be At Risk for Harm To Self or Others Based on Review of Patient Reported Information or Presenting Complaint? No data recorded Method: No data recorded Availability of Means: No data recorded Intent: No data recorded Notification Required: No data recorded Additional Information for Danger to Others Potential: No data recorded Additional Comments for Danger to Others Potential: No  data recorded Are There Guns or Other Weapons in [place]? No data recorded Types of Guns/Weapons: No data recorded Are These Weapons Safely Secured?                             No data recorded Who Could Verify You Are Able To Have These Secured: No data recorded Do You Have any Outstanding Charges, Pending Court Dates, Parole/Probation? No data recorded Contacted To Inform of Risk of Harm To Self or Others: No data recorded  Location of Assessment: No data recorded  Does Patient Present under Involuntary Commitment? No data recorded IVC Papers Initial File Date: No data recorded  [place] of Residence: No data recorded  Patient Currently Receiving the Following Services: No data recorded  Determination of Need: No data recorded  Options For Referral: No data recorded    CCA Biopsychosocial  Intake/Chief Complaint:  CCA Intake With Chief Complaint CCA Part Two Date: 10/22/19 Chief Complaint/Presenting Problem: The patient notes, " Depression". Patient's Currently Reported Symptoms/Problems: No energy, fatigue, isolation, Individual's Strengths: The patient notes," I am good with my children and family and i like to help others". Individual's Preferences: The patient notes," Cooking out, watching movies, visit mother and take her outtings". Individual's Abilities: Swimming in the pool. Type of Services Patient Feels Are Needed: Medication Management and Individual Therapy Initial Clinical Notes/Concerns: No Additional  Mental Health Symptoms Depression:  Depression: Change in energy/activity, Difficulty Concentrating, Fatigue, Hopelessness, Irritability, Increase/decrease in appetite, Tearfulness, Sleep (too much or little), Weight gain/loss, Duration of symptoms greater than two weeks  Mania:  Mania: None  Anxiety:   Anxiety: Difficulty concentrating, Fatigue, Irritability, Sleep  Psychosis:  Psychosis: None  Trauma:  Trauma: None  Obsessions:  Obsessions: None  Compulsions:  Compulsions: None  Inattention:  Inattention: None  Hyperactivity/Impulsivity:  Hyperactivity/Impulsivity: N/A  Oppositional/Defiant Behaviors:  Oppositional/Defiant  Behaviors: None  Emotional Irregularity:  Emotional Irregularity: None  Other Mood/Personality Symptoms:  Other Mood/Personality Symptoms: None   Mental Status Exam Appearance and self-care  Stature:  Stature: Average  Weight:  Weight: Overweight  Clothing:  Clothing: Casual  Grooming:  Grooming: Normal  Cosmetic use:  Cosmetic Use: Age appropriate  Posture/gait:  Posture/Gait: Normal  Motor activity:  Motor Activity: Not Remarkable  Sensorium  Attention:  Attention: Normal  Concentration:  Concentration: Normal  Orientation:  Orientation: X5  Recall/memory:  Recall/Memory: Defective in Short-term  Affect and Mood  Affect:  Affect: Appropriate  Mood:  Mood: Depressed  Relating  Eye contact:  Eye Contact: Normal  Facial expression:  Facial Expression: Responsive, Sad  Attitude toward examiner:  Attitude Toward Examiner: Cooperative  Thought and Language  Speech flow: Speech Flow: Normal  Thought content:  Thought Content: Appropriate to Mood and Circumstances  Preoccupation:  Preoccupations: None  Hallucinations:  Hallucinations: None  Organization:   Landscape architect of Knowledge:  Fund of Knowledge: Good  Intelligence:  Intelligence: Average  Abstraction:  Abstraction: Normal  Judgement:  Judgement: Good  Reality Testing:  Reality Testing: Realistic  Insight:  Insight: Good  Decision Making:  Decision Making: Normal  Social Functioning  Social Maturity:  Social Maturity: Responsible  Social Judgement:  Social Judgement: Normal  Stress  Stressors:  Stressors: Grief/losses, Transitions (The patient notes her husbands grandmother and brothers mother-in-law. Adjusting to not working)  Coping Ability:  Coping Ability: Normal  Skill Deficits:  Skill Deficits: None  Supports:  Supports: Family, Friends/Service system  Religion: Religion/Spirituality Are You A Religious Person?: Yes What is Your Religious Affiliation?: Jehovah's Witness How Might  This Affect Treatment?: Protective Factor  Leisure/Recreation: Leisure / Recreation Do You Have Hobbies?: Yes Leisure and Hobbies: Sw[place]  Exercise/Diet: Exercise/Diet Do You Exercise?: No Have You Gained or Lost A Significant Amount of Weight in the Past Six Months?: Yes-Gained Number of Pounds Gained: 30 Do You Follow a Special Diet?: No Do You Have Any Trouble Sleeping?: Yes Explanation of Sleeping Difficulties: The patient notes," I was having difficulty with both falling asleep as well as staying asleep".   CCA Employment/Education  Employment/Work Situation: Employment / Work Situation Employment situation: Unemployed Patient's job has been impacted by current illness: No What is the longest time patient has a held a job?: 62yrs Where was the patient employed at that time?: Therapist, nutritional Has patient ever been in the [employer]?: No  Education: Education Is Patient Currently Attending School?: No Last Grade Completed: 12 Name of [place]: [place] Did Teacher, adult education From [employer]?: No Did Physicist, medical?: No Did Heritage manager?: No Did You Have Any Chief Technology Officer In School?: NA Did You Have An Individualized Education Program (IIEP): No Did You Have Any Difficulty At [employer]?: No Patient's Education Has Been Impacted by Current Illness: No   CCA Family/Childhood History  Family and Relationship History: Family history Marital status: Married Number of Years Married: 8 What types of issues is patient dealing with in the relationship?: The patient notes no concern Additional relationship information: None Are you sexually active?: No What is your sexual orientation?: Heterosexual Has your sexual activity been affected by drugs, alcohol, medication, or emotional stress?: NA Does patient have children?: Yes How many children?: 3 How is patient's relationship with their children?: The patient notes, "My  relationship with my girls are very good".  Childhood History:  Childhood History By whom was/is the patient raised?: Mother Additional childhood history information: No Additional Description of patient's relationship with caregiver when they were a child: The patient notes, " I spent alot of time with my grandmother and my mother worked Company secretary". Patient's description of current relationship with people who raised him/her: The patient notes, " My Mother is still alive and i have a very good relationship with my Mother". How were you disciplined when you got in trouble as a child/adolescent?: Spankings/Timeout Does patient have siblings?: Yes Number of Siblings: 3 Description of patient's current relationship with siblings: The patient notes she has 2 sisters and 1 brother. The patient notes," We speak every once in awhile". Did patient suffer any verbal/emotional/physical/sexual abuse as a child?: No Did patient suffer from severe childhood neglect?: No Has patient ever been sexually abused/assaulted/raped as an adolescent or adult?: No Was the patient ever a victim of a crime or a disaster?: No Witnessed domestic violence?: No Has patient been affected by domestic violence as an adult?: No  Child/Adolescent Assessment:     CCA Substance Use  Alcohol/Drug Use: Alcohol / Drug Use Pain Medications: See MAR Prescriptions: See MAR Over the Counter: B12, Iron Pills History of alcohol / drug use?: No history of alcohol / drug abuse Longest period of sobriety (when/how long): NA                         ASAM's:  Six Dimensions of Multidimensional Assessment  Dimension 1:  Acute Intoxication and/or Withdrawal Potential:  Dimension 2:  Biomedical Conditions and Complications:      Dimension 3:  Emotional, Behavioral, or Cognitive Conditions and Complications:     Dimension 4:  Readiness to Change:     Dimension 5:  Relapse, Continued use, or Continued Problem Potential:      Dimension 6:  Recovery/Living Environment:     ASAM Severity Score:    ASAM Recommended Level of Treatment:     Substance use Disorder (SUD)    Recommendations for Services/Supports/Treatments: Recommendations for Services/Supports/Treatments Recommendations For Services/Supports/Treatments: Individual Therapy, Medication Management  DSM5 Diagnoses: Patient Active Problem List   Diagnosis Date Noted  . Dilated cardiomyopathy ([place]) 05/12/2019  . Mild major depression ([place]) 05/12/2019  . Stress incontinence in female 03/25/2017  . History of deep venous thrombosis (DVT) of distal vein of right lower extremity 01/18/2017  . Adrenal adenoma, left 09/11/2016  . Current moderate episode of major depressive disorder without prior episode ([place]) 08/10/2016  . 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 History of radioactive iodine thyroid ablation   . 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 Iron deficiency anemia 07/27/2015  . B12 deficiency 07/27/2015  . History of pulmonary embolus (PE) 07/25/2015  . Adrenal nodule ([place]) 07/25/2015  . Pulmonary nodules/lesions, multiple 07/25/2015  . OSA (obstructive sleep apnea) 05/18/2015  . Thyroid nodule 12/17/2014  . Heavy menstrual period 09/02/2014  . Gastroesophageal reflux disease without esophagitis 09/02/2014  . Morbid obesity, unspecified obesity type ([place]) 06/22/2014  . Large breasts 06/22/2014  . Anemia, iron deficiency 06/22/2014  . H/O hyperthyroidism 06/22/2014  . Right ventricular dilation 06/22/2014  . 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    Patient Centered Plan: Patient is on the following Treatment Plan(s): Depression  Referrals to Alternative Service(s): Referred to Alternative Service(s):   Place:   Date:   Time:    Referred to Alternative Service(s):   Place:   Date:   Time:    Referred to Alternative Service(s):    Place:   Date:   Time:    Referred to Alternative Service(s):   Place:   Date:   Time:      I discussed the assessment and treatment plan with the patient. The patient was provided an opportunity to ask questions and all were answered. The patient agreed with the plan and demonstrated an understanding of the instructions.   The patient was advised to call back or seek an in-person evaluation if the symptoms worsen or if the condition fails to improve as anticipated.  I provided 60 minutes of non-face-to-face time during this encounter  Lennox Grumbles, Marlinda Mike  10/22/2019

## 2019-11-10 ENCOUNTER — Telehealth (HOSPITAL_COMMUNITY): Payer: Self-pay | Admitting: Clinical

## 2019-11-10 ENCOUNTER — Other Ambulatory Visit: Payer: Self-pay

## 2019-11-10 ENCOUNTER — Ambulatory Visit (HOSPITAL_COMMUNITY): Payer: Medicaid Other | Admitting: Clinical

## 2019-11-10 NOTE — Telephone Encounter (Signed)
The patient connected but was unable to hear or see on the video link, OPT called and left VM, however, patient did not respond

## 2019-11-30 NOTE — Progress Notes (Signed)
Virtual Visit via Telephone Note  I connected with Madeline Dominguez on 12/08/19 at  2:40 PM EST by telephone and verified that I am speaking with the correct person using two identifiers.  Location: Patient: car Provider: office Persons participated in the visit- patient, provider   I discussed the limitations, risks, security and privacy concerns of performing an evaluation and management service by telephone and the availability of in person appointments. I also discussed with the patient that there may be a patient responsible charge related to this service. The patient expressed understanding and agreed to proceed.   I discussed the assessment and treatment plan with the patient. The patient was provided an opportunity to ask questions and all were answered. The patient agreed with the plan and demonstrated an understanding of the instructions.   The patient was advised to call back or seek an in-person evaluation if the symptoms worsen or if the condition fails to improve as anticipated.   I provided 12 minutes of non-face-to-face time during this encounter.   [name], MD     [facility] MD/PA/NP OP Progress Note  12/08/2019 3:19 PM Madeline Dominguez  MRN:  [id number]  Chief Complaint:  Chief Complaint    Follow-up; Depression     HPI:  She was driving when the interview was started. When she was advised to pull over her car, she declined it. This appointment was done 20 mins after her originally scheduled visit.   She states that she is not doing well.  Her brother and her 25 had medical issues.  She also talks about her aunt, who recently had seizure.  She feels down and anxious given the current situation.  However, she believes she is fine with the current medication.  She has occasional insomnia.  She occasionally has decreased appetite.  She has fair concentration.  She has fair energy.  She denies SI.  She feels anxious and tense at times.    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 Her husband was incarceratedin1/2018 for robbery (violated parol). She was a single parent for 25 years. Marital status:married Number of children:3, age 11, 59, 96  Visit Diagnosis:    ICD-10-CM   1. MDD (major depressive disorder), recurrent episode, mild ([place])  F33.0     Past Psychiatric History: Please see initial evaluation for full details. I have reviewed the history. No updates at this time.     Past Medical History:  Past Medical History:  Diagnosis Date  . Adrenal adenoma    a. normal cortisol and aldosterone levels  . Cough 07/25/2015  . Dilated cardiomyopathy ([place])    a. TTE 2/15: EF of 55-60%, dilated LV & LA, and nl RV; b. TTE 6/16:EF 55-60%, no RWMA, nl LV dias fxn, LV cavity size nl with mild concentric LVH, mildly dilated ascending aorta, LA nl size, RV sys fxn nl, PASP nl; c. TTE 7/17: EF 45-50%, unable to exclude RWMA, nl LV dias faxn, mildly dilated RV w/ nl sys fxn, PASP nl; d. TTE 7/18: EF 45-50%, mild LVH, Gr1DD, mildly dilated LA, mildly dilated RV  . Goiter    a. s/p radioactive iodine treatment in 2010  . Heart palpitations   . Hypertension   . Iron deficiency   . Large breasts   . Morbid  obesity ([place])   . Postpartum hemorrhage   . Pulmonary embolism ([place]) 07/2015   a. s/p Eliquis; b. hypercoag work up neg; c. felt to be 2/2 OCP    Past Surgical History:  Procedure Laterality Date  . CESAREAN SECTION     26 years ago - repaired bleeding after vaginal birth    Family Psychiatric History: Please see initial evaluation for full details. I have reviewed the history. No updates at this time.     Family History:  Family History  Problem Relation Age of Onset  . Hypertension Mother   . CVA Father   . Deep vein thrombosis  Brother   . Pulmonary embolism Brother   . Anemia Sister     Social History:  Social History   Socioeconomic History  . Marital status: Married    Spouse name: Not on file  . Number of children: 3  . Years of education: Not on file  . Highest education level: High school graduate  Occupational History  . Not on file  Tobacco Use  . Smoking status: Never Smoker  . Smokeless tobacco: Never Used  Vaping Use  . Vaping Use: Never used  Substance and Sexual Activity  . Alcohol use: No    Alcohol/week: 0.0 standard drinks    Comment: rarely  . Drug use: No  . Sexual activity: Not Currently    Partners: Male    Birth control/protection: Condom  Other Topics Concern  . Not on file  Social History Narrative   Lives with 3 with 3 daughters, husband was incarcerated April 2018 - for stealing, she has not been keeping in touch with him because she is disappointed. 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 Difficulty of Paying Living Expenses: Not on file  Food Insecurity:   . Worried About Charity fundraiser in the Last Year: Not on file  . Ran Out of Food in the Last Year: Not on file  Transportation Needs:   . Lack of Transportation (Medical): Not on file  . Lack of Transportation (Non-Medical): Not on file  Physical Activity:   . Days of Exercise per Week: Not on file  . Minutes of Exercise per Session: Not on file  Stress:   . Feeling of Stress : Not on file  Social Connections:   . Frequency of Communication with Friends and Family: Not on file  . Frequency of Social Gatherings with Friends and Family: Not on file  . Attends Religious Services: Not on file  . Active Member of Clubs or Organizations: Not on file  . Attends Archivist Meetings: Not on file  . Marital  Status: Not on file    Allergies:  Allergies  Allergen Reactions  . Aspirin Nausea And Vomiting  . Hctz [Hydrochlorothiazide] Other (See Comments)    Nausea, cramping  . Tomato Itching and Swelling    Metabolic Disorder Labs: Lab Results  Component Value Date   HGBA1C 5.2 05/12/2019   MPG 103 05/12/2019   MPG 97 01/24/2018   No results found for: PROLACTIN Lab Results  Component Value Date   CHOL 201 (H) 05/12/2019   TRIG 72 05/12/2019   HDL 76 05/12/2019   CHOLHDL 2.6 05/12/2019   VLDL  11 04/27/2016   LDLCALC 109 (H) 05/12/2019   LDLCALC 124 (H) 01/24/2018   Lab Results  Component Value Date   TSH 4.72 (H) 08/24/2019   TSH 7.71 (H) 05/12/2019    Therapeutic Level Labs: No results found for: LITHIUM No results found for: VALPROATE No components found for:  CBMZ  Current Medications: Current Outpatient Medications  Medication Sig Dispense Refill  . amLODipine-valsartan (EXFORGE) 5-160 MG tablet Take 1 tablet by mouth once daily 90 tablet 0  . baclofen (LIORESAL) 10 MG tablet Take 1 tablet (10 mg total) by mouth 3 (three) times daily as needed for muscle spasms. 30 each 0  . busPIRone (BUSPAR) 5 MG tablet Take 1-2 tablets (5-10 mg total) by mouth 3 (three) times daily as needed. (Patient not taking: Reported on 08/11/2019) 60 tablet 2  . ferrous sulfate 325 (65 FE) MG tablet Take 1 tablet (325 mg total) by mouth 2 (two) times daily with a meal. 180 tablet 1  . levothyroxine (SYNTHROID) 25 MCG tablet Take 1 tablet (25 mcg total) by mouth 3 (three) times a week. 12 tablet 1  . meloxicam (MOBIC) 15 MG tablet Take 1 tablet by mouth once daily 90 tablet 1  . ondansetron (ZOFRAN ODT) 4 MG disintegrating tablet Take 1 tablet (4 mg total) by mouth every 8 (eight) hours as needed for nausea or vomiting. 10 tablet 0  . pantoprazole (PROTONIX) 20 MG tablet Take 1 tablet (20 mg total) by mouth every morning. One hour before breakfast 30 tablet 1  . sertraline (ZOLOFT) 100 MG  tablet 125 mg daily. Take along with 25 mg. 30 tablet 2  . [START ON 01/02/2020] sertraline (ZOLOFT) 25 MG tablet 125 mg daily. Take along with 100 mg tab 30 tablet 1  . sertraline (ZOLOFT) 50 MG tablet Take 1.5 tablets (75 mg total) by mouth daily. 135 tablet 1  . vitamin B-12 1000 MCG tablet Take 1 tablet (1,000 mcg total) by mouth daily. 30 tablet 0   No current facility-administered medications for this visit.     Musculoskeletal: Strength & Muscle Tone: N/A Gait & Station: N/A Patient leans: N/A  Psychiatric Specialty Exam: Review of Systems  Psychiatric/Behavioral: Negative for agitation, behavioral problems, confusion and decreased concentration.  All other systems reviewed and are negative.   There were no vitals taken for this visit.There is no height or weight on file to calculate BMI.  General Appearance: NA  Eye Contact:  NA  Speech:  Clear and Coherent  Volume:  Normal  Mood:  Depressed  Affect:  NA  Thought Process:  Coherent  Orientation:  Full (Time, Place, and Person)  Thought Content: Logical   Suicidal Thoughts:  No  Homicidal Thoughts:  No  Memory:  Immediate;   Good  Judgement:  Good  Insight:  Fair  Psychomotor Activity:  Normal  Concentration:  Concentration: Good and Attention Span: Good  Recall:  Good  Fund of Knowledge: Good  Language: Good  Akathisia:  No  Handed:  Right  AIMS (if indicated): not done  Assets:  Communication Skills Desire for Improvement  ADL's:  Intact  Cognition: WNL  Sleep:  Poor   Screenings: GAD-7     Office Visit from 07/22/2017 in [facility]  Total GAD-7 Score 3    PHQ2-9     Office Visit from 07/14/2019 in [facility] Office Visit from 05/12/2019 in [facility] Office Visit from 12/02/2018 in [facility] Office  Visit from 04/25/2018 in [facility] Office Visit from 04/02/2018 in [facility]  PHQ-2  Total Score 2 1 1  0 0  PHQ-9 Total Score 9 1 4  0 0       Assessment and Plan:  Madeline Dominguez is a 54 y.o. year old female with a history of depression, hypertension,dilated cardiomyopathy,adrenal adenoma (undergoing evaluation),bilateral pulmonary embolism, obesity, obstructive sleep apnea on CPAP, history of toxic multinodular goiter, who presents for follow up appointment for below.   1. MDD (major depressive disorder), recurrent episode, mild ([place]) She reports slight worsening in depressive symptoms in the context of medical condition of her family member.  She is not interested in up titration of her sertraline at this time.  Will continue current dose of sertraline to target depression.   Plan 1. Continue sertraline 125 mg at night 2. Next appointment: 3/1 at 1:20 for 20 mins, video - TSH will be rechecked in September by her PCP  The patient demonstrates the following risk factors for suicide: Chronic risk factors for suicide include:psychiatric disorder ofdepression. Acute risk factorsfor suicide include: unemployment and loss (financial, interpersonal, professional). Protective factorsfor this patient include: positive social support and hope for the future. Considering these factors, the overall suicide risk at this point appears to below. Patientisappropriate for outpatient follow up.  [name], MD 12/08/2019, 3:19 PM

## 2019-12-08 ENCOUNTER — Other Ambulatory Visit: Payer: Self-pay

## 2019-12-08 ENCOUNTER — Telehealth (HOSPITAL_COMMUNITY): Payer: Medicaid Other | Admitting: Psychiatry

## 2019-12-08 ENCOUNTER — Encounter: Payer: Self-pay | Admitting: Psychiatry

## 2019-12-08 ENCOUNTER — Telehealth (INDEPENDENT_AMBULATORY_CARE_PROVIDER_SITE_OTHER): Payer: Medicaid Other | Admitting: Psychiatry

## 2019-12-08 DIAGNOSIS — F330 Major depressive disorder, recurrent, mild: Secondary | ICD-10-CM

## 2019-12-08 MED ORDER — SERTRALINE HCL 100 MG PO TABS: ORAL_TABLET | ORAL | 2 refills | Status: DC

## 2019-12-08 MED ORDER — SERTRALINE HCL 25 MG PO TABS: ORAL_TABLET | ORAL | 1 refills | Status: DC

## 2019-12-10 ENCOUNTER — Telehealth (HOSPITAL_COMMUNITY): Payer: Medicaid Other | Admitting: Psychiatry

## 2019-12-28 ENCOUNTER — Telehealth: Payer: Self-pay | Admitting: Family Medicine

## 2019-12-28 DIAGNOSIS — I10 Essential (primary) hypertension: Secondary | ICD-10-CM

## 2019-12-31 ENCOUNTER — Other Ambulatory Visit: Payer: Self-pay

## 2020-01-03 ENCOUNTER — Other Ambulatory Visit: Payer: Self-pay | Admitting: Family Medicine

## 2020-01-03 DIAGNOSIS — I10 Essential (primary) hypertension: Secondary | ICD-10-CM

## 2020-01-04 ENCOUNTER — Other Ambulatory Visit: Payer: Self-pay

## 2020-01-04 MED ORDER — AMLODIPINE BESYLATE-VALSARTAN 5-160 MG PO TABS: 1.0000 | ORAL_TABLET | Freq: Every day | ORAL | 0 refills | Status: DC

## 2020-01-04 NOTE — Telephone Encounter (Signed)
Patient would like medication request expedited and a callback from Dr. Carlynn Purl nurse once medication has been sent to pharmacy

## 2020-01-14 ENCOUNTER — Other Ambulatory Visit: Payer: Self-pay

## 2020-01-14 ENCOUNTER — Encounter: Payer: Self-pay | Admitting: Family Medicine

## 2020-01-14 DIAGNOSIS — M62838 Other muscle spasm: Secondary | ICD-10-CM

## 2020-01-14 DIAGNOSIS — D509 Iron deficiency anemia, unspecified: Secondary | ICD-10-CM

## 2020-01-14 DIAGNOSIS — E039 Hypothyroidism, unspecified: Secondary | ICD-10-CM

## 2020-01-14 DIAGNOSIS — I10 Essential (primary) hypertension: Secondary | ICD-10-CM

## 2020-01-15 ENCOUNTER — Other Ambulatory Visit: Payer: Self-pay | Admitting: Family Medicine

## 2020-01-15 MED ORDER — BACLOFEN 10 MG PO TABS: 10.0000 mg | ORAL_TABLET | Freq: Three times a day (TID) | ORAL | 0 refills | Status: DC | PRN

## 2020-01-15 MED ORDER — LEVOTHYROXINE SODIUM 25 MCG PO TABS: 25.0000 ug | ORAL_TABLET | ORAL | 0 refills | Status: DC

## 2020-01-20 NOTE — Progress Notes (Signed)
Name: Madeline Dominguez   MRN: [id number]    DOB: March 02, 1965   Date:01/21/2020       Progress Note  Virtual Visit via Video Note  I connected with Madeline Dominguez on 01/21/20 at 10:20 AM EST by a telephone  enabled telemedicine application and verified that I am speaking with the correct person using two identifiers.  Location: Patient: at home  Provider: [facility]   I discussed the limitations of evaluation and management by telemedicine and the availability of in person appointments. The patient expressed understanding and agreed to proceed.    Subjective  Chief Complaint  Follow up   HPI   Cardiomyopathy: denies orthopnea but has noticed  decrease in exercise tolerance and some lower extremity edema with activity . She is  on lisinopril. No chest painor palpitation. AT the time of evaluation back in 2018 it was found to be secondary to hyperthyroidism and uncontrolled HTN, she has not been in our office since January 2021 due to COVID-19 pandemic. She has not been to cardiologist , Madeline Dominguez since 08/2016. At the time SOB was thought to be unlikely due to cardiomyopathy , but advised to continue [facility] - current on ARB due to angioedema , he suggested adding beta blocker if needed. She will return next week to check vital signs and consider adding beta-blocker   HTN:bp is at home has been 130's/80's, but it was elevated when she saw Endo at [facility] back in September, advised her to return for bp check with CMA next week.  used to see nephrologist , she is on Exforge because ace She has noticed episodes of angioedema ( after she eats tomatoes and used to have that as a child) , she stopped ACE and tomatoes and no angioedema since Dec 2020 She denies chest pain or palpitation   Depression: severe episode in 2018 with anhedonia, lack of appetite, she had pain all over, unable to work for months. She was started on Zoloft she also had some therapy and is doing better. Mild symptoms now, she  states secondary to losing her job because of [place] -67. Keep follow up with psychiatrist   Adrenal nodule left/Goiter : no symptoms, many tests done,last visit was at [place] 09/2019 and per note if labs normal it was considered non functional adenoma. She also has a goiter and TSH was at goal, she is taking levothyroxine 25 mcg daily. She denies dysphagia   Obesity:discussed life style modification again, she thinks she has lost weight, she is trying to eat smaller portions, and trying to be more active  GERD: taking medication prn only and is doing well.She still has medication at home   OA: taking meloxicam daily, she daily pain aching pain on both knees. Standing aggravates the pain , she states has intermittent effusion when more active. Worse when cold outside, explained we will fill rx once she has labs done .   Anemia:resolved, last labs reviewed with patient. B12 also back to normal She is due for colonoscopy she had an appointment last year but cancelled, we will try fecal immuno-test to increase compliance    Exposed to COVID-19: last week through family members, last week she noticed body aches and headaches. Denies change in appetite, lack of sense of smell and taste. She does not want to come in to be tested, she will have a home test done today  Patient Active Problem List   Diagnosis Date Noted  . Dilated cardiomyopathy ([place]) 05/12/2019  .  Mild major depression (HCC) 05/12/2019  . Stress incontinence in female 03/25/2017  . History of deep venous thrombosis (DVT) of distal vein of right lower extremity 01/18/2017  . Adrenal adenoma, left 09/11/2016  . Current moderate episode of major depressive disorder without prior episode (HCC) 08/10/2016  . 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 History of radioactive iodine thyroid ablation   . 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 Iron deficiency anemia 07/27/2015  . B12  deficiency 07/27/2015  . History of pulmonary embolus (PE) 07/25/2015  . Adrenal nodule (HCC) 07/25/2015  . Pulmonary nodules/lesions, multiple 07/25/2015  . OSA (obstructive sleep apnea) 05/18/2015  . Thyroid nodule 12/17/2014  . Gastroesophageal reflux disease without esophagitis 09/02/2014  . Morbid obesity, unspecified obesity type (HCC) 06/22/2014  . Large breasts 06/22/2014  . Anemia, iron deficiency 06/22/2014  . H/O hyperthyroidism 06/22/2014  . Right ventricular dilation 06/22/2014  . 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    Past Surgical History:  Procedure Laterality Date  . CESAREAN SECTION     26 years ago - repaired bleeding after vaginal birth    Family History  Problem Relation Age of Onset  . Hypertension Mother   . CVA Father   . Deep vein thrombosis Brother   . Pulmonary embolism Brother   . Anemia Sister     Social History   Tobacco Use  . Smoking status: Never Smoker  . Smokeless tobacco: Never Used  Substance Use Topics  . Alcohol use: No    Alcohol/week: 0.0 standard drinks    Comment: rarely     Current Outpatient Medications:  .  amLODipine-valsartan (EXFORGE) 5-160 MG tablet, Take 1 tablet by mouth daily., Disp: 30 tablet, Rfl: 0 .  baclofen (LIORESAL) 10 MG tablet, Take 1 tablet (10 mg total) by mouth 3 (three) times daily as needed for muscle spasms., Disp: 30 tablet, Rfl: 0 .  ferrous sulfate 325 (65 FE) MG tablet, Take 1 tablet (325 mg total) by mouth 2 (two) times daily with a meal., Disp: 180 tablet, Rfl: 1 .  levothyroxine (SYNTHROID) 25 MCG tablet, Take 1 tablet (25 mcg total) by mouth 3 (three) times a week., Disp: 12 tablet, Rfl: 0 .  meloxicam (MOBIC) 15 MG tablet, Take 1 tablet by mouth once daily, Disp: 90 tablet, Rfl: 1 .  pantoprazole (PROTONIX) 20 MG tablet, Take 1 tablet (20 mg total) by mouth every morning. One hour before breakfast, Disp: 30 tablet, Rfl: 1 .  sertraline  (ZOLOFT) 100 MG tablet, 125 mg daily. Take along with 25 mg., Disp: 30 tablet, Rfl: 2 .  sertraline (ZOLOFT) 25 MG tablet, 125 mg daily. Take along with 100 mg tab, Disp: 30 tablet, Rfl: 1 .  vitamin B-12 1000 MCG tablet, Take 1 tablet (1,000 mcg total) by mouth daily., Disp: 30 tablet, Rfl: 0  Allergies  Allergen Reactions  . Aspirin Nausea And Vomiting  . Hctz [Hydrochlorothiazide] Other (See Comments)    Nausea, cramping  . Tomato Itching and Swelling    I personally reviewed active problem list, medication list, allergies, family history, social history, health maintenance with the patient/caregiver today.   ROS  Ten systems reviewed and is negative except as mentioned in HPI   Objective  Vitals:   01/21/20 0856  Weight: (!) 387 lb (175.5 kg)  Height: 5\' 7"  (1.702 m)    Body mass index is 60.61 kg/m.  Physical Exam  Awake, alert and  oriented   PHQ2/9: Depression screen [facility] 2/9 01/21/2020 07/14/2019 05/12/2019 12/02/2018 04/25/2018  Decreased Interest 1 1 1  0 0  Down, Depressed, Hopeless 1 1 0 1 0  PHQ - 2 Score 2 2 1 1  0  Altered sleeping 2 2 0 2 0  Tired, decreased energy 2 1 0 1 0  Change in appetite 2 2 0 0 0  Feeling bad or failure about yourself  0 0 0 0 0  Trouble concentrating 2 1 0 0 0  Moving slowly or fidgety/restless 0 1 0 0 0  Suicidal thoughts 0 0 0 0 0  PHQ-9 Score 10 9 1 4  0  Difficult doing work/chores Not difficult at all Somewhat difficult Not difficult at all Not difficult at all Not difficult at all  Some recent data might be hidden    phq 9 is positive   Fall Risk: Fall Risk  01/21/2020 07/14/2019 05/12/2019 12/02/2018 04/25/2018  Falls in the past year? 0 0 0 0 0  Number falls in past yr: 0 0 0 0 0  Injury with Fall? 0 0 0 0 0  Follow up - - Falls evaluation completed - -     Functional Status Survey: Is the patient deaf or have difficulty hearing?: Yes Does the patient have difficulty seeing, even when wearing glasses/contacts?:  No Does the patient have difficulty concentrating, remembering, or making decisions?: Yes Does the patient have difficulty walking or climbing stairs?: Yes Does the patient have difficulty dressing or bathing?: No Does the patient have difficulty doing errands alone such as visiting a doctor's office or shopping?: No    Assessment & Plan  1. Adrenal nodule ([place])  Keep follow up with Endo, reviewed records from [place] [facility]  2. Mild major depression ([place])  Continue follow up with psychiatrist, advised to contact her sooner due to increase in phq 9  3. Morbid obesity, unspecified obesity type [facility])  Discussed with the patient the risk posed by an increased BMI. Discussed importance of portion control, calorie counting and at least 150 minutes of physical activity weekly. Avoid sweet beverages and drink more water. Eat at least 6 servings of fruit and vegetables daily   4. B12 deficiency  - Vitamin B12  5. Need for hepatitis C screening test  - Hepatitis C Antibody  6. Breast cancer screening by mammogram  - MM Digital Screening; Future  7. Gastroesophageal reflux disease without esophagitis   8. Colon cancer screening  - Fecal Globin By Immunochemistry  9. Hypothyroidism, adult  - levothyroxine (SYNTHROID) 25 MCG tablet; Take 1 tablet (25 mcg total) by mouth daily. Gets it from Dr. Kenton Kingfisher - endocrinologist at [place]: 30 tablet; Refill: 0  10. Long-term use of high-risk medication  - COMPLETE METABOLIC PANEL WITH GFR - CBC with Differential/Platelet  11. Lipid screening  - Lipid panel  12. Goiter, toxic, multinodular   13. Diabetes mellitus screening  - Hemoglobin A1c  14. Dilated cardiomyopathy ([place])   15. History of iron deficiency anemia  - CBC with Differential/Platelet - Iron, TIBC and Ferritin Panel   16. Primary osteoarthritis of both knees   I discussed the assessment and treatment plan with the patient. The patient was provided an  opportunity to ask questions and all were answered. The patient agreed with the plan and demonstrated an understanding of the instructions.   The patient was advised to call back or seek an in-person evaluation if the symptoms worsen or if the condition fails to improve  as anticipated.  I provided 25  minutes of non-face-to-face time during this encounter.   Loistine Chance, MD

## 2020-01-21 ENCOUNTER — Encounter: Payer: Self-pay | Admitting: Family Medicine

## 2020-01-21 ENCOUNTER — Ambulatory Visit (INDEPENDENT_AMBULATORY_CARE_PROVIDER_SITE_OTHER): Payer: Medicaid Other | Admitting: Family Medicine

## 2020-01-21 VITALS — Ht 67.0 in | Wt 387.0 lb

## 2020-01-21 DIAGNOSIS — K219 Gastro-esophageal reflux disease without esophagitis: Secondary | ICD-10-CM

## 2020-01-21 DIAGNOSIS — Z1159 Encounter for screening for other viral diseases: Secondary | ICD-10-CM

## 2020-01-21 DIAGNOSIS — Z1211 Encounter for screening for malignant neoplasm of colon: Secondary | ICD-10-CM | POA: Diagnosis not present

## 2020-01-21 DIAGNOSIS — Z131 Encounter for screening for diabetes mellitus: Secondary | ICD-10-CM

## 2020-01-21 DIAGNOSIS — Z13220 Encounter for screening for lipoid disorders: Secondary | ICD-10-CM

## 2020-01-21 DIAGNOSIS — I420 Dilated cardiomyopathy: Secondary | ICD-10-CM

## 2020-01-21 DIAGNOSIS — E039 Hypothyroidism, unspecified: Secondary | ICD-10-CM | POA: Diagnosis not present

## 2020-01-21 DIAGNOSIS — E538 Deficiency of other specified B group vitamins: Secondary | ICD-10-CM

## 2020-01-21 DIAGNOSIS — E278 Other specified disorders of adrenal gland: Secondary | ICD-10-CM | POA: Diagnosis not present

## 2020-01-21 DIAGNOSIS — E0520 Thyrotoxicosis with toxic multinodular goiter without thyrotoxic crisis or storm: Secondary | ICD-10-CM

## 2020-01-21 DIAGNOSIS — Z862 Personal history of diseases of the blood and blood-forming organs and certain disorders involving the immune mechanism: Secondary | ICD-10-CM

## 2020-01-21 DIAGNOSIS — Z1231 Encounter for screening mammogram for malignant neoplasm of breast: Secondary | ICD-10-CM

## 2020-01-21 DIAGNOSIS — M170 Bilateral primary osteoarthritis of knee: Secondary | ICD-10-CM

## 2020-01-21 DIAGNOSIS — F320 Major depressive disorder, single episode, mild: Secondary | ICD-10-CM

## 2020-01-21 DIAGNOSIS — Z79899 Other long term (current) drug therapy: Secondary | ICD-10-CM

## 2020-01-21 MED ORDER — LEVOTHYROXINE SODIUM 25 MCG PO TABS: 25.0000 ug | ORAL_TABLET | Freq: Every day | ORAL | 0 refills | Status: DC

## 2020-01-25 ENCOUNTER — Other Ambulatory Visit: Payer: Self-pay

## 2020-01-25 ENCOUNTER — Other Ambulatory Visit: Payer: Self-pay | Admitting: Family Medicine

## 2020-01-25 DIAGNOSIS — M170 Bilateral primary osteoarthritis of knee: Secondary | ICD-10-CM

## 2020-01-25 DIAGNOSIS — I10 Essential (primary) hypertension: Secondary | ICD-10-CM

## 2020-01-26 DIAGNOSIS — Z131 Encounter for screening for diabetes mellitus: Secondary | ICD-10-CM | POA: Diagnosis not present

## 2020-01-26 DIAGNOSIS — Z862 Personal history of diseases of the blood and blood-forming organs and certain disorders involving the immune mechanism: Secondary | ICD-10-CM | POA: Diagnosis not present

## 2020-01-26 DIAGNOSIS — Z1211 Encounter for screening for malignant neoplasm of colon: Secondary | ICD-10-CM | POA: Diagnosis not present

## 2020-01-26 DIAGNOSIS — Z79899 Other long term (current) drug therapy: Secondary | ICD-10-CM | POA: Diagnosis not present

## 2020-01-26 DIAGNOSIS — Z1159 Encounter for screening for other viral diseases: Secondary | ICD-10-CM | POA: Diagnosis not present

## 2020-01-26 DIAGNOSIS — Z13220 Encounter for screening for lipoid disorders: Secondary | ICD-10-CM | POA: Diagnosis not present

## 2020-01-26 NOTE — Telephone Encounter (Signed)
lvm for pt to come by the office between 8-12 and 2-4 for labs before this refill is done, per dr Ancil Boozer

## 2020-01-27 LAB — CBC WITH DIFFERENTIAL/PLATELET
Absolute Monocytes: 419 cells/uL (ref 200–950)
Basophils Absolute: 21 cells/uL (ref 0–200)
Basophils Relative: 0.3 %
Eosinophils Absolute: 149 cells/uL (ref 15–500)
Eosinophils Relative: 2.1 %
HCT: 37 % (ref 35.0–45.0)
Hemoglobin: 12 g/dL (ref 11.7–15.5)
Lymphs Abs: 2648 cells/uL (ref 850–3900)
MCH: 29.5 pg (ref 27.0–33.0)
MCHC: 32.4 g/dL (ref 32.0–36.0)
MCV: 90.9 fL (ref 80.0–100.0)
MPV: 11 fL (ref 7.5–12.5)
Monocytes Relative: 5.9 %
Neutro Abs: 3862 cells/uL (ref 1500–7800)
Neutrophils Relative %: 54.4 %
Platelets: 236 10*3/uL (ref 140–400)
RBC: 4.07 10*6/uL (ref 3.80–5.10)
RDW: 12.7 % (ref 11.0–15.0)
Total Lymphocyte: 37.3 %
WBC: 7.1 10*3/uL (ref 3.8–10.8)

## 2020-01-27 LAB — COMPLETE METABOLIC PANEL WITH GFR
AG Ratio: 1.1 (calc) (ref 1.0–2.5)
ALT: 11 U/L (ref 6–29)
AST: 11 U/L (ref 10–35)
Albumin: 3.8 g/dL (ref 3.6–5.1)
Alkaline phosphatase (APISO): 83 U/L (ref 37–153)
BUN: 13 mg/dL (ref 7–25)
CO2: 29 mmol/L (ref 20–32)
Calcium: 9.3 mg/dL (ref 8.6–10.4)
Chloride: 104 mmol/L (ref 98–110)
Creat: 0.73 mg/dL (ref 0.50–1.05)
GFR, Est African American: 110 mL/min/{1.73_m2} (ref >=60)
GFR, Est Non African American: 95 mL/min/{1.73_m2} (ref >=60)
Globulin: 3.4 g/dL (calc) (ref 1.9–3.7)
Glucose, Bld: 127 mg/dL — ABNORMAL HIGH (ref 65–99)
Potassium: 3.9 mmol/L (ref 3.5–5.3)
Sodium: 142 mmol/L (ref 135–146)
Total Bilirubin: 0.3 mg/dL (ref 0.2–1.2)
Total Protein: 7.2 g/dL (ref 6.1–8.1)

## 2020-01-27 LAB — LIPID PANEL
Cholesterol: 207 mg/dL — ABNORMAL HIGH (ref <200)
HDL: 69 mg/dL (ref >=50)
LDL Cholesterol (Calc): 123 mg/dL (calc) — ABNORMAL HIGH
Non-HDL Cholesterol (Calc): 138 mg/dL (calc) — ABNORMAL HIGH (ref <130)
Total CHOL/HDL Ratio: 3 (calc) (ref <5.0)
Triglycerides: 55 mg/dL (ref <150)

## 2020-01-27 LAB — IRON,TIBC AND FERRITIN PANEL
%SAT: 17 % (calc) (ref 16–45)
Ferritin: 104 ng/mL (ref 16–232)
Iron: 44 ug/dL — ABNORMAL LOW (ref 45–160)
TIBC: 252 mcg/dL (calc) (ref 250–450)

## 2020-01-27 LAB — HEPATITIS C ANTIBODY
Hepatitis C Ab: NONREACTIVE
SIGNAL TO CUT-OFF: 0.06 (ref <1.00)

## 2020-01-27 LAB — HEMOGLOBIN A1C
Hgb A1c MFr Bld: 5.2 % of total Hgb (ref <5.7)
Mean Plasma Glucose: 103 mg/dL
eAG (mmol/L): 5.7 mmol/L

## 2020-01-27 LAB — VITAMIN B12: Vitamin B-12: 391 pg/mL (ref 200–1100)

## 2020-01-27 MED ORDER — AMLODIPINE BESYLATE-VALSARTAN 5-160 MG PO TABS: 1.0000 | ORAL_TABLET | Freq: Every day | ORAL | 0 refills | Status: DC

## 2020-01-27 NOTE — Telephone Encounter (Signed)
lvm to schedule appt.  

## 2020-01-28 ENCOUNTER — Ambulatory Visit: Payer: Medicaid Other

## 2020-01-28 ENCOUNTER — Other Ambulatory Visit: Payer: Self-pay | Admitting: Family Medicine

## 2020-01-28 ENCOUNTER — Other Ambulatory Visit: Payer: Self-pay

## 2020-01-28 DIAGNOSIS — Z1231 Encounter for screening mammogram for malignant neoplasm of breast: Secondary | ICD-10-CM

## 2020-02-24 NOTE — Progress Notes (Signed)
Virtual Visit via Video Note  I connected with Madeline Dominguez on 03/01/20 at  1:20 PM EST by a video enabled telemedicine application and verified that I am speaking with the correct person using two identifiers.  Location: Patient: home Provider: office Persons participated in the visit- patient, provider   I discussed the limitations of evaluation and management by telemedicine and the availability of in person appointments. The patient expressed understanding and agreed to proceed.    I discussed the assessment and treatment plan with the patient. The patient was provided an opportunity to ask questions and all were answered. The patient agreed with the plan and demonstrated an understanding of the instructions.   The patient was advised to call back or seek an in-person evaluation if the symptoms worsen or if the condition fails to improve as anticipated.  I provided 12 minutes of non-face-to-face time during this encounter.   [name], MD       [facility] MD/PA/NP OP Progress Note  03/01/2020 1:44 PM Madeline Dominguez  MRN:  [id number]  Chief Complaint:  Chief Complaint    Follow-up; Depression     HPI:  This is a follow-up appointment for depression and anxiety.  She states that her mother-in-law will have resection of bladder next week as they found some tumor. She is concerned about this. Although Madeline Dominguez and her husband are unemployed, her husband will be getting interview. She tries to go out and do things with her husband. She thinks she has been handling things relatively well. She has insomnia at times. She feels down and has fatigue. She denies change in appetite. She denies SI. She would like to stay on the current dose of medication as she believes her mood will be better after the surgery next week.     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 Her husband was incarceratedin1/2018 for robbery (violated parol). She was a single parent for 25 years. Marital status:married Number of children:3, age 19, 63, 16  Visit Diagnosis:    ICD-10-CM   1. Mild episode of recurrent major depressive disorder (Madeline Dominguez)  F33.0     Past Psychiatric History: Please see initial evaluation for full details. I have reviewed the history. No updates at this time.     Past Medical History:  Past Medical History:  Diagnosis Date  . Adrenal adenoma    a. normal cortisol and aldosterone levels  . Cough 07/25/2015  . Dilated cardiomyopathy ([place])    a. TTE 2/15: EF of 55-60%, dilated LV & LA, and nl RV; b. TTE 6/16:EF 55-60%, no RWMA, nl LV dias fxn, LV cavity size nl with mild concentric LVH, mildly dilated ascending aorta, LA nl size, RV sys fxn nl, PASP nl; c. TTE 7/17: EF 45-50%, unable to exclude RWMA, nl LV dias faxn, mildly dilated RV w/ nl sys fxn, PASP nl; d. TTE 7/18: EF 45-50%, mild LVH, Gr1DD, mildly dilated LA, mildly dilated RV  . Goiter    a. s/p radioactive iodine treatment in 2010  . Heart palpitations   . Hypertension   . Iron deficiency   . Large breasts   . Morbid obesity ([place])   . Postpartum hemorrhage   . Pulmonary embolism ([place]) 07/2015   a. s/p Eliquis; b. hypercoag work up neg; c. felt  to be 2/2 OCP    Past Surgical History:  Procedure Laterality Date  . CESAREAN SECTION     26 years ago - repaired bleeding after vaginal birth    Family Psychiatric History: Please see initial evaluation for full details. I have reviewed the history. No updates at this time.     Family History:  Family History  Problem Relation Age of Onset  . Hypertension Mother   . CVA Father   . Deep vein thrombosis Brother   . Pulmonary embolism Brother   . Anemia Sister     Social History:  Social History   Socioeconomic History  . Marital  status: Married    Spouse name: Not on file  . Number of children: 3  . Years of education: Not on file  . Highest education level: High school graduate  Occupational History  . Not on file  Tobacco Use  . Smoking status: Never Smoker  . Smokeless tobacco: Never Used  Vaping Use  . Vaping Use: Never used  Substance and Sexual Activity  . Alcohol use: No    Alcohol/week: 0.0 standard drinks    Comment: rarely  . Drug use: No  . Sexual activity: Not Currently    Partners: Male    Birth control/protection: Condom  Other Topics Concern  . Not on file  Social History Narrative   Lives with 3 with 3 daughters, husband was incarcerated April 2018 - for stealing, she has not been keeping in touch with him because she is disappointed. 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    Social Determinants of Health   Financial Resource Strain: Not on file  Food Insecurity: Not on file  Transportation Needs: Not on file  Physical Activity: Not on file  Stress: Not on file  Social Connections: Not on file    Allergies:  Allergies  Allergen Reactions  . Aspirin Nausea And Vomiting  . Hctz [Hydrochlorothiazide] Other (See Comments)    Nausea, cramping  . Tomato Itching and Swelling    Metabolic Disorder Labs: Lab Results  Component Value Date   HGBA1C 5.2 01/26/2020   MPG 103 01/26/2020   MPG 103 05/12/2019   No results found for: PROLACTIN Lab Results  Component Value Date   CHOL 207 (H) 01/26/2020   TRIG 55 01/26/2020   HDL 69 01/26/2020   CHOLHDL 3.0 01/26/2020   VLDL 11 04/27/2016   LDLCALC 123 (H) 01/26/2020   LDLCALC 109 (H) 05/12/2019   Lab Results  Component Value Date   TSH 4.72 (H) 08/24/2019   TSH 7.71 (H) 05/12/2019    Therapeutic Level Labs: No results found for: LITHIUM No results found for: VALPROATE No components found  for:  CBMZ  Current Medications: Current Outpatient Medications  Medication Sig Dispense Refill  . amLODipine-valsartan (EXFORGE) 5-160 MG tablet Take 1 tablet by mouth daily. 90 tablet 0  . baclofen (LIORESAL) 10 MG tablet Take 1 tablet (10 mg total) by mouth 3 (three) times daily as needed for muscle spasms. 30 tablet 0  . ferrous sulfate 325 (65 FE) MG tablet Take 1 tablet (325 mg total) by mouth 2 (two) times daily with a meal. 180 tablet 1  . levothyroxine (SYNTHROID) 25 MCG tablet Take 1 tablet (25 mcg total) by mouth daily. Gets it from Dr. Kenton Kingfisher - endocrinologist  at [facility] 30 tablet 0  . meloxicam (MOBIC) 15 MG tablet Take 1 tablet by mouth once daily 90 tablet 0  . pantoprazole (PROTONIX) 20 MG tablet Take 1 tablet (20 mg total) by mouth every morning. One hour before breakfast 30 tablet 1  . [START ON 03/07/2020] sertraline (ZOLOFT) 100 MG tablet 125 mg daily. Take along with 25 mg. 30 tablet 2  . sertraline (ZOLOFT) 25 MG tablet 125 mg daily. Take along with 100 mg tab 30 tablet 2  . vitamin B-12 1000 MCG tablet Take 1 tablet (1,000 mcg total) by mouth daily. 30 tablet 0   No current facility-administered medications for this visit.     Musculoskeletal: Strength & Muscle Tone: N/A Gait & Station: N/A Patient leans: N/A  Psychiatric Specialty Exam: Review of Systems  Psychiatric/Behavioral: Positive for decreased concentration, dysphoric mood and sleep disturbance. Negative for agitation, behavioral problems, confusion, hallucinations, self-injury and suicidal ideas. The patient is nervous/anxious. The patient is not hyperactive.   All other systems reviewed and are negative.   There were no vitals taken for this visit.There is no height or weight on file to calculate BMI.  General Appearance: Fairly Groomed  Eye Contact:  Good  Speech:  Clear and Coherent  Volume:  Normal  Mood:  good  Affect:  Appropriate, Congruent and slightly down, but reactive  Thought Process:   Coherent  Orientation:  Full (Time, Place, and Person)  Thought Content: Logical   Suicidal Thoughts:  No  Homicidal Thoughts:  No  Memory:  Immediate;   Good  Judgement:  Good  Insight:  Fair  Psychomotor Activity:  Normal  Concentration:  Concentration: Good and Attention Span: Good  Recall:  Good  Fund of Knowledge: Good  Language: Good  Akathisia:  No  Handed:  Right  AIMS (if indicated): not done  Assets:  Communication Skills Desire for Improvement  ADL's:  Intact  Cognition: WNL  Sleep:  Fair   Screenings: GAD-7   [place] Office Visit from 01/21/2020 in [facility] Office Visit from 07/22/2017 in [facility]  Total GAD-7 Score 3 3    PHQ2-9   Flowsheet Row Video Visit from 03/01/2020 in [place] Office Visit from 01/21/2020 in [facility] Office Visit from 07/14/2019 in [facility] Office Visit from 05/12/2019 in [facility] Office Visit from 12/02/2018 in [facility]  PHQ-2 Total Score 2 2 2 1 1   PHQ-9 Total Score 8 10 9 1 4     Flowsheet Row Video Visit from 03/01/2020 in [place] No Risk       Assessment and Plan:  Madeline Dominguez is a 55 y.o. year old female with a history of depression, hypertension,dilated cardiomyopathy,adrenal adenoma (undergoing evaluation),bilateral pulmonary embolism, obesity, obstructive sleep apnea on CPAP, history of toxic multinodular goiter, who presents for follow up appointment for below.   1. Mild episode of recurrent major depressive disorder ([place]) Although she continues to report depressive symptoms, she has been managing things relatively well since the last visit. Psychosocial stressors includes medical condition of her mother-in-law,  and financial strain. Will continue current dose of sertraline to target depression. Will  consider up titration if any worsening in her mood symptoms.   Plan 1. Continue sertraline 125mg  at night 2. Next appointment: 5/31 at 1:20 for 20 mins, video    The patient demonstrates the following risk factors  for suicide: Chronic risk factors for suicide include:psychiatric disorder ofdepression. Acute risk factorsfor suicide include: unemployment and loss (financial, interpersonal, professional). Protective factorsfor this patient include: positive social support and hope for the future. Considering these factors, the overall suicide risk at this point appears to below. Patientisappropriate for outpatient follow up.   [name], MD 03/01/2020, 1:44 PM

## 2020-03-01 ENCOUNTER — Encounter: Payer: Self-pay | Admitting: Psychiatry

## 2020-03-01 ENCOUNTER — Other Ambulatory Visit: Payer: Self-pay

## 2020-03-01 ENCOUNTER — Telehealth (INDEPENDENT_AMBULATORY_CARE_PROVIDER_SITE_OTHER): Payer: Medicaid Other | Admitting: Psychiatry

## 2020-03-01 DIAGNOSIS — F330 Major depressive disorder, recurrent, mild: Secondary | ICD-10-CM | POA: Diagnosis not present

## 2020-03-01 MED ORDER — SERTRALINE HCL 100 MG PO TABS: ORAL_TABLET | ORAL | 2 refills | Status: DC

## 2020-03-01 MED ORDER — SERTRALINE HCL 25 MG PO TABS: ORAL_TABLET | ORAL | 2 refills | Status: DC

## 2020-03-01 NOTE — Patient Instructions (Signed)
1. Continue sertraline 125mg  at night 2. Next appointment: 5/31 at 1:20

## 2020-04-04 DIAGNOSIS — Z862 Personal history of diseases of the blood and blood-forming organs and certain disorders involving the immune mechanism: Secondary | ICD-10-CM | POA: Diagnosis not present

## 2020-04-04 DIAGNOSIS — Z1211 Encounter for screening for malignant neoplasm of colon: Secondary | ICD-10-CM | POA: Diagnosis not present

## 2020-04-04 DIAGNOSIS — Z79899 Other long term (current) drug therapy: Secondary | ICD-10-CM | POA: Diagnosis not present

## 2020-04-04 DIAGNOSIS — Z1159 Encounter for screening for other viral diseases: Secondary | ICD-10-CM | POA: Diagnosis not present

## 2020-04-04 DIAGNOSIS — Z131 Encounter for screening for diabetes mellitus: Secondary | ICD-10-CM | POA: Diagnosis not present

## 2020-04-04 DIAGNOSIS — Z13220 Encounter for screening for lipoid disorders: Secondary | ICD-10-CM | POA: Diagnosis not present

## 2020-04-04 LAB — FECAL OCCULT BLOOD, GUAIAC: Fecal Occult Blood: NEGATIVE

## 2020-04-07 LAB — FECAL GLOBIN BY IMMUNOCHEMISTRY
FECAL GLOBIN RESULT:: NOT DETECTED
MICRO NUMBER:: 11738197
SPECIMEN QUALITY:: ADEQUATE

## 2020-04-16 ENCOUNTER — Other Ambulatory Visit: Payer: Self-pay | Admitting: Family Medicine

## 2020-04-16 DIAGNOSIS — M170 Bilateral primary osteoarthritis of knee: Secondary | ICD-10-CM

## 2020-04-23 ENCOUNTER — Telehealth: Payer: Medicaid Other | Admitting: Physician Assistant

## 2020-04-23 DIAGNOSIS — J0140 Acute pansinusitis, unspecified: Secondary | ICD-10-CM | POA: Diagnosis not present

## 2020-04-23 MED ORDER — AMOXICILLIN-POT CLAVULANATE 875-125 MG PO TABS: 1.0000 | ORAL_TABLET | Freq: Two times a day (BID) | ORAL | 0 refills | Status: DC

## 2020-04-23 NOTE — Progress Notes (Signed)
We are sorry that you are not feeling well.  Here is how we plan to help!  Based on what you have shared with me it looks like you have sinusitis.  Sinusitis is inflammation and infection in the sinus cavities of the head.  Based on your presentation I believe you most likely have Acute Bacterial Sinusitis.  This is an infection caused by bacteria and is treated with antibiotics. I have prescribed Augmentin 875mg /125mg  one tablet twice daily with food, for 7 days. You may use an oral decongestant such as Mucinex or if you have glaucoma or high blood pressure use plain Mucinex. Saline nasal spray help and can safely be used as often as needed for congestion.  If you develop worsening sinus pain, fever or notice severe headache and vision changes, or if symptoms are not better after completion of antibiotic, please schedule an appointment with a health care provider.    Sinus infections are not as easily transmitted as other respiratory infection, however we still recommend that you avoid close contact with loved ones, especially the very young and elderly.  Remember to wash your hands thoroughly throughout the day as this is the number one way to prevent the spread of infection!  Home Care:  Only take medications as instructed by your medical team.  Complete the entire course of an antibiotic.  Do not take these medications with alcohol.  A steam or ultrasonic humidifier can help congestion.  You can place a towel over your head and breathe in the steam from hot water coming from a faucet.  Avoid close contacts especially the very young and the elderly.  Cover your mouth when you cough or sneeze.  Always remember to wash your hands.  Get Help Right Away If:  You develop worsening fever or sinus pain.  You develop a severe head ache or visual changes.  Your symptoms persist after you have completed your treatment plan.  Make sure you  Understand these instructions.  Will watch your  condition.  Will get help right away if you are not doing well or get worse.  Your e-visit answers were reviewed by a board certified advanced clinical practitioner to complete your personal care plan.  Depending on the condition, your plan could have included both over the counter or prescription medications.  If there is a problem please reply  once you have received a response from your provider.  Your safety is important to [place].  If you have drug allergies check your prescription carefully.    You can use MyChart to ask questions about today's visit, request a non-urgent call back, or ask for a work or school excuse for 24 hours related to this e-Visit. If it has been greater than 24 hours you will need to follow up with your provider, or enter a new e-Visit to address those concerns.  You will get an e-mail in the next two days asking about your experience.  I hope that your e-visit has been valuable and will speed your recovery. Thank you for using e-visits.  I provided 5 minutes of non face-to-face time during this encounter for chart review and documentation.

## 2020-04-25 NOTE — Progress Notes (Deleted)
Name: Madeline Dominguez   MRN: [id number]    DOB: January 04, 1965   Date:04/25/2020       Progress Note  Subjective  Chief Complaint  Follow up   HPI Cardiomyopathy: denies orthopnea but has noticed  decrease in exercise tolerance and some lower extremity edema with activity . She is  on lisinopril. No chest painor palpitation. AT the time of evaluation back in 2018 it was found to be secondary to hyperthyroidism and uncontrolled HTN, she has not been in our office since January 2021 due to COVID-19 pandemic. She has not been to cardiologist , Dr. Virl Axe since 08/2016. At the time SOB was thought to be unlikely due to cardiomyopathy , but advised to continue [facility] - current on ARB due to angioedema , he suggested adding beta blocker if needed. She will return next week to check vital signs and consider adding beta-blocker   HTN:bp is at home has been 130's/80's, but it was elevated when she saw Endo at [facility] back in September, advised her to return for bp check with CMA next week.  used to see nephrologist , she is on Exforge because ace She has noticed episodes of angioedema ( after she eats tomatoes and used to have that as a child) , she stopped ACE and tomatoes and no angioedema since Dec 2020 She denies chest pain or palpitation   Depression: severe episode in 2018 with anhedonia, lack of appetite, she had pain all over, unable to work for months. She was started on Zoloft she also had some therapy and is doing better. Mild symptoms now, she states secondary to losing her job because of [place] -28. Keep follow up with psychiatrist   Adrenal nodule left/Goiter : no symptoms, many tests done,last visit was at [facility] and per note if labs normal it was considered non functional adenoma. She also has a goiter and TSH was at goal, she is taking levothyroxine 25 mcg daily. She denies dysphagia   Obesity:discussed life style modification again, she thinks she has lost weight, she is trying  to eat smaller portions, and trying to be more active  GERD: taking medication prn only and is doing well.She still has medication at home   OA: taking meloxicam daily, she daily pain aching pain on both knees. Standing aggravates the pain , she states has intermittent effusion when more active. Worse when cold outside, explained we will fill rx once she has labs done .   Anemia:resolved, last labs reviewed with patient. B12 also back to normal She is due for colonoscopy she had an appointment last year but cancelled, we will try fecal immuno-test to increase compliance    Exposed to COVID-19: last week through family members, last week she noticed body aches and headaches. Denies change in appetite, lack of sense of smell and taste. She does not want to come in to be tested, she will have a home test done today  *** Patient Active Problem List   Diagnosis Date Noted  . Dilated cardiomyopathy ([place]) 05/12/2019  . Mild major depression ([place]) 05/12/2019  . Stress incontinence in female 03/25/2017  . History of deep venous thrombosis (DVT) of distal vein of right lower extremity 01/18/2017  . Adrenal adenoma, left 09/11/2016  . Current moderate episode of major depressive disorder without prior episode ([place]) 08/10/2016  . 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 History of radioactive iodine thyroid ablation   . 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  Iron deficiency anemia 07/27/2015  . B12 deficiency 07/27/2015  . History of pulmonary embolus (PE) 07/25/2015  . Adrenal nodule ([place]) 07/25/2015  . Pulmonary nodules/lesions, multiple 07/25/2015  . OSA (obstructive sleep apnea) 05/18/2015  . Thyroid nodule 12/17/2014  . Gastroesophageal reflux disease without esophagitis 09/02/2014  . Morbid obesity, unspecified obesity type ([place]) 06/22/2014  . Large breasts 06/22/2014  . Anemia, iron deficiency 06/22/2014  . H/O hyperthyroidism  06/22/2014  . Right ventricular dilation 06/22/2014  . 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    Past Surgical History:  Procedure Laterality Date  . CESAREAN SECTION     26 years ago - repaired bleeding after vaginal birth    Family History  Problem Relation Age of Onset  . Hypertension Mother   . CVA Father   . Deep vein thrombosis Brother   . Pulmonary embolism Brother   . Anemia Sister     Social History   Tobacco Use  . Smoking status: Never Smoker  . Smokeless tobacco: Never Used  Substance Use Topics  . Alcohol use: No    Alcohol/week: 0.0 standard drinks    Comment: rarely     Current Outpatient Medications:  .  amLODipine-valsartan (EXFORGE) 5-160 MG tablet, Take 1 tablet by mouth daily., Disp: 90 tablet, Rfl: 0 .  amoxicillin-clavulanate (AUGMENTIN) 875-125 MG tablet, Take 1 tablet by mouth 2 (two) times daily., Disp: 14 tablet, Rfl: 0 .  baclofen (LIORESAL) 10 MG tablet, Take 1 tablet (10 mg total) by mouth 3 (three) times daily as needed for muscle spasms., Disp: 30 tablet, Rfl: 0 .  EUTHYROX 50 MCG tablet, Take 50 mcg by mouth daily., Disp: , Rfl:  .  ferrous sulfate 325 (65 FE) MG tablet, Take 1 tablet (325 mg total) by mouth 2 (two) times daily with a meal., Disp: 180 tablet, Rfl: 1 .  levothyroxine (SYNTHROID) 25 MCG tablet, Take 1 tablet (25 mcg total) by mouth daily. Gets it from Dr. Kenton Kingfisher - endocrinologist at [facility], [place]: 30 tablet, Rfl: 0 .  meloxicam (MOBIC) 15 MG tablet, Take 1 tablet by mouth once daily, Disp: 30 tablet, Rfl: 0 .  pantoprazole (PROTONIX) 20 MG tablet, Take 1 tablet (20 mg total) by mouth every morning. One hour before breakfast, Disp: 30 tablet, Rfl: 1 .  sertraline (ZOLOFT) 100 MG tablet, 125 mg daily. Take along with 25 mg., Disp: 30 tablet, Rfl: 2 .  sertraline (ZOLOFT) 25 MG tablet, 125 mg daily. Take along with 100 mg tab, Disp: 30 tablet, Rfl: 2 .  vitamin B-12 1000 MCG tablet,  Take 1 tablet (1,000 mcg total) by mouth daily., Disp: 30 tablet, Rfl: 0  Allergies  Allergen Reactions  . Aspirin Nausea And Vomiting  . Hctz [Hydrochlorothiazide] Other (See Comments)    Nausea, cramping  . Tomato Itching and Swelling    I personally reviewed {Reviewed:14835} with the patient/caregiver today.   ROS  ***  Objective  There were no vitals filed for this visit.  There is no height or weight on file to calculate BMI.  Physical Exam ***  Recent Results (from the past 2160 hour(s))  Hepatitis C Antibody     Status: None   Collection Time: 01/26/20 11:43 AM  Result Value Ref Range   Hepatitis C Ab NON-REACTIVE NON-REACTI   SIGNAL TO CUT-OFF 0.06 <1.00    Comment: . HCV antibody was non-reactive. There is no laboratory  evidence of HCV infection. . In most cases,  no further action is required. However, if recent HCV exposure is suspected, a test for HCV RNA (test code [phone]) is suggested. . For additional information please refer to http://education.[facility].com/faq/FAQ22v1 (This link is being provided for informational/ educational purposes only.) .   COMPLETE METABOLIC PANEL WITH GFR     Status: Abnormal   Collection Time: 01/26/20 11:43 AM  Result Value Ref Range   Glucose, Bld 127 (H) 65 - 99 mg/dL    Comment: .            Fasting reference interval . For someone without known diabetes, a glucose value >125 mg/dL indicates that they may have diabetes and this should be confirmed with a follow-up test. .    BUN 13 7 - 25 mg/dL   Creat 0.73 0.50 - 1.05 mg/dL    Comment: For patients >14 years of age, the reference limit for Creatinine is approximately 13% higher for people identified as African-American. .    GFR, Est Non African American 95 > OR = 60 mL/min/1.42m2   GFR, Est African American 110 > OR = 60 mL/min/1.54m2   BUN/Creatinine Ratio NOT APPLICABLE 6 - 22 (calc)   Sodium 142 135 - 146 mmol/L   Potassium 3.9 3.5 - 5.3  mmol/L   Chloride 104 98 - 110 mmol/L   CO2 29 20 - 32 mmol/L   Calcium 9.3 8.6 - 10.4 mg/dL   Total Protein 7.2 6.1 - 8.1 g/dL   Albumin 3.8 3.6 - 5.1 g/dL   Globulin 3.4 1.9 - 3.7 g/dL (calc)   AG Ratio 1.1 1.0 - 2.5 (calc)   Total Bilirubin 0.3 0.2 - 1.2 mg/dL   Alkaline phosphatase (APISO) 83 37 - 153 U/L   AST 11 10 - 35 U/L   ALT 11 6 - 29 U/L  CBC with Differential/Platelet     Status: None   Collection Time: 01/26/20 11:43 AM  Result Value Ref Range   WBC 7.1 3.8 - 10.8 Thousand/uL   RBC 4.07 3.80 - 5.10 Million/uL   Hemoglobin 12.0 11.7 - 15.5 g/dL   HCT 37.0 35.0 - 45.0 %   MCV 90.9 80.0 - 100.0 fL   MCH 29.5 27.0 - 33.0 pg   MCHC 32.4 32.0 - 36.0 g/dL   RDW 12.7 11.0 - 15.0 %   Platelets 236 140 - 400 Thousand/uL   MPV 11.0 7.5 - 12.5 fL   Neutro Abs 3,862 1,500 - 7,800 cells/uL   Lymphs Abs 2,648 850 - 3,900 cells/uL   Absolute Monocytes 419 200 - 950 cells/uL   Eosinophils Absolute 149 15 - 500 cells/uL   Basophils Absolute 21 0 - 200 cells/uL   Neutrophils Relative % 54.4 %   Total Lymphocyte 37.3 %   Monocytes Relative 5.9 %   Eosinophils Relative 2.1 %   Basophils Relative 0.3 %  Lipid panel     Status: Abnormal   Collection Time: 01/26/20 11:43 AM  Result Value Ref Range   Cholesterol 207 (H) <200 mg/dL   HDL 69 > OR = 50 mg/dL   Triglycerides 55 <150 mg/dL   LDL Cholesterol (Calc) 123 (H) mg/dL (calc)    Comment: Reference range: <100 . Desirable range <100 mg/dL for primary prevention;   <70 mg/dL for patients with CHD or diabetic patients  with > or = 2 CHD risk factors. Marland Kitchen LDL-C is now calculated using the Martin-Hopkins  calculation, which is a validated novel method providing  better accuracy than the Friedewald equation  in the  estimation of LDL-C.  Cresenciano Genre et al. Annamaria Helling. 6073;710(62): 2061-2068  (http://education.[facility].com/faq/FAQ164)    Total CHOL/HDL Ratio 3.0 <5.0 (calc)   Non-HDL Cholesterol (Calc) 138 (H) <130 mg/dL  (calc)    Comment: For patients with diabetes plus 1 major ASCVD risk  factor, treating to a non-HDL-C goal of <100 mg/dL  (LDL-C of <70 mg/dL) is considered a therapeutic  option.   Hemoglobin A1c     Status: None   Collection Time: 01/26/20 11:43 AM  Result Value Ref Range   Hgb A1c MFr Bld 5.2 <5.7 % of total Hgb    Comment: For the purpose of screening for the presence of diabetes: . <5.7%       Consistent with the absence of diabetes 5.7-6.4%    Consistent with increased risk for diabetes             (prediabetes) > or =6.5%  Consistent with diabetes . This assay result is consistent with a decreased risk of diabetes. . Currently, no consensus exists regarding use of hemoglobin A1c for diagnosis of diabetes in children. . According to American Diabetes Association (ADA) guidelines, hemoglobin A1c <7.0% represents optimal control in non-pregnant diabetic patients. Different metrics may apply to specific patient populations.  Standards of Medical Care in Diabetes(ADA). .    Mean Plasma Glucose 103 mg/dL   eAG (mmol/L) 5.7 mmol/L  Iron, TIBC and Ferritin Panel     Status: Abnormal   Collection Time: 01/26/20 11:43 AM  Result Value Ref Range   Iron 44 (L) 45 - 160 mcg/dL   TIBC 252 250 - 450 mcg/dL (calc)   %SAT 17 16 - 45 % (calc)   Ferritin 104 16 - 232 ng/mL  Vitamin B12     Status: None   Collection Time: 01/26/20 11:43 AM  Result Value Ref Range   Vitamin B-12 391 200 - 1,100 pg/mL    Comment: . Please Note: Although the reference range for vitamin B12 is [phone] pg/mL, it has been reported that between 5 and 10% of patients with values between 200 and 400 pg/mL may experience neuropsychiatric and hematologic abnormalities due to occult B12 deficiency; less than 1% of patients with values above 400 pg/mL will have symptoms. .   Fecal Globin By Immunochemistry     Status: None   Collection Time: 04/04/20 12:00 AM  Result Value Ref Range   MICRO NUMBER:  [id number]    SPECIMEN QUALITY: Adequate    Source: INSURE (TM) FOBT TEST CARD    STATUS: FINAL    FECAL GLOBIN RESULT: Not Detected   Fecal Occult Blood, [place]     Status: None   Collection Time: 04/04/20 12:00 AM  Result Value Ref Range   Fecal Occult Blood Negative     Diabetic Foot Exam: Diabetic Foot Exam - Simple   No data filed    ***  PHQ2/9: Depression screen [facility] 2/9 01/21/2020 07/14/2019 05/12/2019 12/02/2018 04/25/2018  Decreased Interest 1 1 1  0 0  Down, Depressed, Hopeless 1 1 0 1 0  PHQ - 2 Score 2 2 1 1  0  Altered sleeping 2 2 0 2 0  Tired, decreased energy 2 1 0 1 0  Change in appetite 2 2 0 0 0  Feeling bad or failure about yourself  0 0 0 0 0  Trouble concentrating 2 1 0 0 0  Moving slowly or fidgety/restless 0 1 0 0 0  Suicidal thoughts 0 0 0 0 0  PHQ-9 Score 10 9 1 4  0  Difficult doing work/chores Not difficult at all Somewhat difficult Not difficult at all Not difficult at all Not difficult at all  Some encounter information is confidential and restricted. Go to Review Flowsheets activity to see all data.  Some recent data might be hidden    phq 9 is {gen pos [id number]} ***  Fall Risk: Fall Risk  01/21/2020 07/14/2019 05/12/2019 12/02/2018 04/25/2018  Falls in the past year? 0 0 0 0 0  Number falls in past yr: 0 0 0 0 0  Injury with Fall? 0 0 0 0 0  Follow up - - Falls evaluation completed - -   ***   Functional Status Survey:   ***   Assessment & Plan  *** There are no diagnoses linked to this encounter.

## 2020-04-26 ENCOUNTER — Ambulatory Visit: Payer: Medicaid Other | Admitting: Family Medicine

## 2020-04-26 DIAGNOSIS — Z1231 Encounter for screening mammogram for malignant neoplasm of breast: Secondary | ICD-10-CM

## 2020-04-26 DIAGNOSIS — Z124 Encounter for screening for malignant neoplasm of cervix: Secondary | ICD-10-CM

## 2020-04-26 DIAGNOSIS — Z1211 Encounter for screening for malignant neoplasm of colon: Secondary | ICD-10-CM

## 2020-05-02 ENCOUNTER — Other Ambulatory Visit: Payer: Self-pay | Admitting: Family Medicine

## 2020-05-02 DIAGNOSIS — I10 Essential (primary) hypertension: Secondary | ICD-10-CM

## 2020-05-02 NOTE — Telephone Encounter (Signed)
Last seen: 4.26.2022 Next sch: not scheduled

## 2020-05-02 NOTE — Telephone Encounter (Signed)
Requested medication (s) are due for refill today: yes  Requested medication (s) are on the active medication list: yes  Last refill:  02/04/2020  Future visit scheduled: no  Notes to clinic:   Patient has not schedule follow up appt    Requested Prescriptions  Pending Prescriptions Disp Refills   amLODipine-valsartan (EXFORGE) 5-160 MG tablet [Pharmacy Med Name: amLODIPine Besylate-Valsartan 5-160 MG Oral Tablet] 90 tablet 0    Sig: TAKE 1 TABLET BY MOUTH ONCE DAILY . APPOINTMENT REQUIRED FOR FUTURE REFILLS      Cardiovascular: CCB + ARB Combos Failed - 05/02/2020 10:25 AM      Failed - Valid encounter within last 6 months    Recent Outpatient Visits           3 months ago Adrenal nodule [facility])   [facility] Steele Sizer, MD   9 months ago Mild major depression [facility])   [facility] Delsa Grana, PA-C   11 months ago Hypertension, benign   [facility] [place], Drue Stager, MD   1 year ago Adrenal nodule [facility])   [facility] Steele Sizer, MD   2 years ago Hypertension, benign   [facility] [place], Drue Stager, MD                Passed - K in normal range and within 180 days    Potassium  Date Value Ref Range Status  01/26/2020 3.9 3.5 - 5.3 mmol/L Final          Passed - Cr in normal range and within 180 days    Creat  Date Value Ref Range Status  01/26/2020 0.73 0.50 - 1.05 mg/dL Final    Comment:    For patients >53 years of age, the reference limit for Creatinine is approximately 13% higher for people identified as African-American. Renella Cunas - Patient is not pregnant      Passed - Last BP in normal range    BP Readings from Last 1 Encounters:  01/28/20 134/82

## 2020-05-03 NOTE — Progress Notes (Signed)
Name: Madeline Dominguez   MRN: [id number]    DOB: [birth year]   Date:05/04/2020       Progress Note  Subjective  Chief Complaint  Medication Refill  HPI  Cardiomyopathy: denies orthopnea but has noticed  decrease in exercise tolerance and some lower extremity edema with activity . She takes Exforge daily.  Only symptom is SOB with moderate activity , denies orthopnea or wheezing, occasionally has lower extremity edema  HTN:bp today is at goal, no chest pain or palpitation  used to see nephrologist She takes Exforge . She has noticed episodes of angioedema ( after she eats tomatoes and used to have that as a child) , she stopped ACE and tomatoes and no angioedema since Dec 2020 She denies chest pain or palpitation   Depression: severe episode in 2018 with anhedonia, lack of appetite, she had pain all over, unable to work for months. She was started on Zoloft she also had some therapy and is doing better.Husband now also unemployed due to company wanting him to relocate to [place] - he is a Administrator, she is also looking for a job   Adrenal nodule left/Goiter : no symptoms, many tests done,last visit was at [facility] 09/2019 per note  it was considered non functional adenoma. She also has a goiter and TSH was at goal, she is taking levothyroxine 50 mcg half daily, reminded her to go back for regular follow up  Obesity:discussed life style modification again, weight is stable. She asked about medication but explained Medicaid does not cover weight loss medicatiton, daughter is getting ready to have bariatric surgery, explained she is also a good candidate for surgery   GERD: she states off medications and no longer having problems   OA: taking meloxicam daily, she daily pain aching pain on both knees, she states stiffness is very bothersome, usually worse when she first starts to move No effusion , redness or increase in warmth. Discussed risk of long term nsaid's therapy , advised to try  alternating with Tylenol 4 times daily   Anemia:resolved, last labs reviewed with patient. B12 also back to normal negative Fecal test 04/2020 , repeat in one year    Patient Active Problem List   Diagnosis Date Noted  . Dilated cardiomyopathy ([place]) 05/12/2019  . Mild major depression ([place]) 05/12/2019  . Stress incontinence in female 03/25/2017  . History of deep venous thrombosis (DVT) of distal vein of right lower extremity 01/18/2017  . Adrenal adenoma, left 09/11/2016  . Current moderate episode of major depressive disorder without prior episode ([place]) 08/10/2016  . 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 History of radioactive iodine thyroid ablation   . 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 Iron deficiency anemia 07/27/2015  . B12 deficiency 07/27/2015  . History of pulmonary embolus (PE) 07/25/2015  . Adrenal nodule ([place]) 07/25/2015  . Pulmonary nodules/lesions, multiple 07/25/2015  . OSA (obstructive sleep apnea) 05/18/2015  . Thyroid nodule 12/17/2014  . Gastroesophageal reflux disease without esophagitis 09/02/2014  . Morbid obesity, unspecified obesity type ([place]) 06/22/2014  . Large breasts 06/22/2014  . Anemia, iron deficiency 06/22/2014  . H/O hyperthyroidism 06/22/2014  . Right ventricular dilation 06/22/2014  . 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    Past Surgical History:  Procedure Laterality Date  . CESAREAN SECTION     26 years ago - repaired bleeding after vaginal birth    Family History  Problem Relation Age  of Onset  . Hypertension Mother   . CVA Father   . Deep vein thrombosis Brother   . Pulmonary embolism Brother   . Anemia Sister     Social History   Tobacco Use  . Smoking status: Never Smoker  . Smokeless tobacco: Never Used  Substance Use Topics  . Alcohol use: No    Alcohol/week: 0.0 standard drinks    Comment: rarely      Current Outpatient Medications:  .  amLODipine-valsartan (EXFORGE) 5-160 MG tablet, TAKE 1 TABLET BY MOUTH ONCE DAILY . APPOINTMENT REQUIRED FOR FUTURE REFILLS, Disp: 30 tablet, Rfl: 0 .  amoxicillin (AMOXIL) 500 MG capsule, Take 500 mg by mouth 3 (three) times daily., Disp: , Rfl:  .  amoxicillin-clavulanate (AUGMENTIN) 875-125 MG tablet, Take 1 tablet by mouth 2 (two) times daily., Disp: 14 tablet, Rfl: 0 .  baclofen (LIORESAL) 10 MG tablet, Take 1 tablet (10 mg total) by mouth 3 (three) times daily as needed for muscle spasms., Disp: 30 tablet, Rfl: 0 .  EUTHYROX 50 MCG tablet, Take 50 mcg by mouth daily., Disp: , Rfl:  .  ferrous sulfate 325 (65 FE) MG tablet, Take 1 tablet (325 mg total) by mouth 2 (two) times daily with a meal., Disp: 180 tablet, Rfl: 1 .  levothyroxine (SYNTHROID) 25 MCG tablet, Take 1 tablet (25 mcg total) by mouth daily. Gets it from Dr. Kenton Kingfisher - endocrinologist at [facility], [place]: 30 tablet, Rfl: 0 .  meloxicam (MOBIC) 15 MG tablet, Take 1 tablet by mouth once daily, Disp: 30 tablet, Rfl: 0 .  pantoprazole (PROTONIX) 20 MG tablet, Take 1 tablet (20 mg total) by mouth every morning. One hour before breakfast, Disp: 30 tablet, Rfl: 1 .  sertraline (ZOLOFT) 100 MG tablet, 125 mg daily. Take along with 25 mg., Disp: 30 tablet, Rfl: 2 .  sertraline (ZOLOFT) 25 MG tablet, 125 mg daily. Take along with 100 mg tab, Disp: 30 tablet, Rfl: 2 .  vitamin B-12 1000 MCG tablet, Take 1 tablet (1,000 mcg total) by mouth daily., Disp: 30 tablet, Rfl: 0  Allergies  Allergen Reactions  . Aspirin Nausea And Vomiting  . Hctz [Hydrochlorothiazide] Other (See Comments)    Nausea, cramping  . Tomato Itching and Swelling    I personally reviewed active problem list, medication list, allergies, family history, social history, health maintenance, notes from last encounter with the patient/caregiver today.   ROS  Constitutional: Negative for fever or weight change.  Respiratory: Negative  for cough and shortness of breath.   Cardiovascular: Negative for chest pain or palpitations.  Gastrointestinal: Negative for abdominal pain, no bowel changes.  Musculoskeletal: Negative for gait problem or joint swelling.  Skin: Negative for rash.  Neurological: Negative for dizziness or headache.  No other specific complaints in a complete review of systems (except as listed in HPI above).   Objective  Vitals:   05/04/20 1144  BP: 136/84  Pulse: 96  Resp: 16  Temp: 98 F (36.7 C)  TempSrc: Oral  SpO2: 100%  Weight: (!) 381 lb (172.8 kg)  Height: 5\' 7"  (1.702 m)    Body mass index is 59.67 kg/m.  Physical Exam  Constitutional: Patient appears well-developed and well-nourished. Obese  No distress.  HEENT: head atraumatic, normocephalic, pupils equal and reactive to light,  neck supple Cardiovascular: Normal rate, regular rhythm and normal heart sounds.  No murmur heard. No BLE edema. Pulmonary/Chest: Effort normal and breath sounds normal. No respiratory distress. Abdominal: Soft.  There  is no tenderness. Psychiatric: Patient has a normal mood and affect. behavior is normal. Judgment and thought content normal.  Recent Results (from the past 2160 hour(s))  Fecal Globin By Immunochemistry     Status: None   Collection Time: 04/04/20 12:00 AM  Result Value Ref Range   MICRO NUMBER: [id number]    SPECIMEN QUALITY: Adequate    Source: INSURE (TM) FOBT TEST CARD    STATUS: FINAL    FECAL GLOBIN RESULT: Not Detected   Fecal Occult Blood, [place]     Status: None   Collection Time: 04/04/20 12:00 AM  Result Value Ref Range   Fecal Occult Blood Negative      PHQ2/9: Depression screen [facility] 2/9 05/04/2020 01/21/2020 07/14/2019 05/12/2019 12/02/2018  Decreased Interest 2 1 1 1  0  Down, Depressed, Hopeless 1 1 1  0 1  PHQ - 2 Score 3 2 2 1 1   Altered sleeping 1 2 2  0 2  Tired, decreased energy 1 2 1  0 1  Change in appetite 1 2 2  0 0  Feeling bad or failure about yourself  0 0 0 0  0  Trouble concentrating 0 2 1 0 0  Moving slowly or fidgety/restless 0 0 1 0 0  Suicidal thoughts 0 0 0 0 0  PHQ-9 Score 6 10 9 1 4   Difficult doing work/chores - Not difficult at all Somewhat difficult Not difficult at all Not difficult at all  Some encounter information is confidential and restricted. Go to Review Flowsheets activity to see all data.  Some recent data might be hidden    phq 9 is positive   Fall Risk: Fall Risk  05/04/2020 01/21/2020 07/14/2019 05/12/2019 12/02/2018  Falls in the past year? 0 0 0 0 0  Number falls in past yr: 0 0 0 0 0  Injury with Fall? 0 0 0 0 0  Follow up - - - Falls evaluation completed -     Functional Status Survey: Is the patient deaf or have difficulty hearing?: No Does the patient have difficulty seeing, even when wearing glasses/contacts?: No Does the patient have difficulty concentrating, remembering, or making decisions?: No Does the patient have difficulty walking or climbing stairs?: No Does the patient have difficulty dressing or bathing?: No Does the patient have difficulty doing errands alone such as visiting a doctor's office or shopping?: No   Assessment & Plan  1. Mild major depression ([place])  Keep follow up with psychiatrist   2. B12 deficiency   3. Adrenal nodule (HCC)  Negative labs, adenoma   4. Gastroesophageal reflux disease without esophagitis  Doing well at this time  5. Hypothyroidism, adult  Under the care of Endo  6. Goiter, toxic, multinodular   7. Dilated cardiomyopathy (HCC)  Stable, mild SOB with activity   8. Hypertension, benign  - amLODipine-valsartan (EXFORGE) 5-160 MG tablet; Take 1 tablet by mouth daily.  Dispense: 90 tablet; Refill: 1  9. Colon cancer screening  Negative Fecal test repeat in one year  10. Primary osteoarthritis of both knees  - meloxicam (MOBIC) 15 MG tablet; Take 1 tablet (15 mg total) by mouth daily.  Dispense: 90 tablet; Refill: 1  11. Mild concentric left  ventricular hypertrophy (LVH)   12. History of iron deficiency anemia

## 2020-05-04 ENCOUNTER — Ambulatory Visit: Payer: Medicaid Other | Admitting: Family Medicine

## 2020-05-04 ENCOUNTER — Other Ambulatory Visit: Payer: Self-pay

## 2020-05-04 ENCOUNTER — Encounter: Payer: Self-pay | Admitting: Family Medicine

## 2020-05-04 VITALS — BP 136/84 | HR 96 | Temp 98.0°F | Resp 16 | Ht 67.0 in | Wt 381.0 lb

## 2020-05-04 DIAGNOSIS — M170 Bilateral primary osteoarthritis of knee: Secondary | ICD-10-CM

## 2020-05-04 DIAGNOSIS — I10 Essential (primary) hypertension: Secondary | ICD-10-CM

## 2020-05-04 DIAGNOSIS — K219 Gastro-esophageal reflux disease without esophagitis: Secondary | ICD-10-CM | POA: Diagnosis not present

## 2020-05-04 DIAGNOSIS — E0520 Thyrotoxicosis with toxic multinodular goiter without thyrotoxic crisis or storm: Secondary | ICD-10-CM | POA: Diagnosis not present

## 2020-05-04 DIAGNOSIS — E279 Disorder of adrenal gland, unspecified: Secondary | ICD-10-CM

## 2020-05-04 DIAGNOSIS — E278 Other specified disorders of adrenal gland: Secondary | ICD-10-CM

## 2020-05-04 DIAGNOSIS — Z1211 Encounter for screening for malignant neoplasm of colon: Secondary | ICD-10-CM

## 2020-05-04 DIAGNOSIS — I517 Cardiomegaly: Secondary | ICD-10-CM

## 2020-05-04 DIAGNOSIS — F320 Major depressive disorder, single episode, mild: Secondary | ICD-10-CM | POA: Diagnosis not present

## 2020-05-04 DIAGNOSIS — E538 Deficiency of other specified B group vitamins: Secondary | ICD-10-CM | POA: Diagnosis not present

## 2020-05-04 DIAGNOSIS — I420 Dilated cardiomyopathy: Secondary | ICD-10-CM | POA: Diagnosis not present

## 2020-05-04 DIAGNOSIS — Z862 Personal history of diseases of the blood and blood-forming organs and certain disorders involving the immune mechanism: Secondary | ICD-10-CM

## 2020-05-04 DIAGNOSIS — E039 Hypothyroidism, unspecified: Secondary | ICD-10-CM

## 2020-05-04 MED ORDER — AMLODIPINE BESYLATE-VALSARTAN 5-160 MG PO TABS
1.0000 | ORAL_TABLET | Freq: Every day | ORAL | 1 refills | Status: DC
Start: 2020-05-04 — End: 2020-12-04

## 2020-05-04 MED ORDER — MELOXICAM 15 MG PO TABS: 1.0000 | ORAL_TABLET | Freq: Every day | ORAL | 1 refills | Status: DC

## 2020-05-24 NOTE — Progress Notes (Signed)
Virtual Visit via Video Note  I connected with Madeline Dominguez on 05/31/20 at  1:20 PM EDT by a video enabled telemedicine application and verified that I am speaking with the correct person using two identifiers.  Location: Patient: home Provider: office Persons participated in the visit- patient, provider   I discussed the limitations of evaluation and management by telemedicine and the availability of in person appointments. The patient expressed understanding and agreed to proceed.    I discussed the assessment and treatment plan with the patient. The patient was provided an opportunity to ask questions and all were answered. The patient agreed with the plan and demonstrated an understanding of the instructions.   The patient was advised to call back or seek an in-person evaluation if the symptoms worsen or if the condition fails to improve as anticipated.  I provided 10 minutes of non-face-to-face time during this encounter.   Madeline Clay, MD    [facility] MD/PA/NP OP Progress Note  05/31/2020 1:42 PM Madeline Dominguez  MRN:  [id number]  Chief Complaint:  Chief Complaint    Follow-up; Depression     HPI:  This is a follow-up appointment for depression.  She states that she had a job interview, and she may start a job at [employer].  She feels good about this.  Her mother-in-law has been doing well, and adapting well after s/p removal of bladder.  Although she occasionally feels down, her husband and her children motivates her to do things.  She has started taking a walk, and she attributes this to losing her weight.  She sleeps better.  She has depressive symptoms as in PHQ-9.  She denies SI.  She feels comfortable to stay on her current medication.   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 Her husband was incarceratedin1/2018 for robbery (violated parol). She was a single parent for 25 years. Marital status:married Number of children:3, age 53, 51, 66  Visit Diagnosis:    ICD-10-CM   1. Mild episode of recurrent major depressive disorder ([place])  F33.0     Past Psychiatric History: Please see initial evaluation for full details. I have reviewed the history. No updates at this time.     Past Medical History:  Past Medical History:  Diagnosis Date  . Adrenal adenoma    a. normal cortisol and aldosterone levels  . Cough 07/25/2015  . Dilated cardiomyopathy ([place])    a. TTE 2/15: EF of 55-60%, dilated LV & LA, and nl RV; b. TTE 6/16:EF 55-60%, no RWMA, nl LV dias fxn, LV cavity size nl with mild concentric LVH, mildly dilated ascending aorta, LA nl size, RV sys fxn nl, PASP nl; c. TTE 7/17: EF 45-50%, unable to exclude RWMA, nl LV dias faxn, mildly dilated RV w/ nl sys fxn, PASP nl; d. TTE 7/18: EF 45-50%, mild LVH, Gr1DD, mildly dilated LA, mildly dilated RV  . Goiter    a. s/p radioactive iodine treatment in 2010  . Heart palpitations   . Hypertension   . Iron deficiency   . Large breasts   . Morbid obesity ([place])   . Postpartum hemorrhage   . Pulmonary embolism ([place]) 07/2015   a. s/p Eliquis; b. hypercoag work up neg; c. felt to be 2/2 OCP    Past Surgical History:  Procedure Laterality Date  . CESAREAN SECTION     26 years ago - repaired bleeding after vaginal birth    Family Psychiatric History: Please see initial evaluation for full details. I have reviewed the history. No updates at this time.     Family History:  Family History  Problem Relation Age of Onset  . Hypertension Mother   . CVA Father   . Deep vein thrombosis Brother   . Pulmonary embolism Brother   . Anemia Sister     Social History:  Social History   Socioeconomic History  . Marital status: Married    Spouse name: Not on file  . Number of children: 3   . Years of education: Not on file  . Highest education level: High school graduate  Occupational History  . Not on file  Tobacco Use  . Smoking status: Never Smoker  . Smokeless tobacco: Never Used  Vaping Use  . Vaping Use: Never used  Substance and Sexual Activity  . Alcohol use: No    Alcohol/week: 0.0 standard drinks    Comment: rarely  . Drug use: No  . Sexual activity: Not Currently    Partners: Male    Birth control/protection: Condom  Other Topics Concern  . Not on file  Social History Narrative   Lives with 3 with 3 daughters, husband was incarcerated April 2018 - for stealing, she has not been keeping in touch with him because she is disappointed. 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    Social Determinants of Health   Financial Resource Strain: Not on file  Food Insecurity: Not on file  Transportation Needs: Not on file  Physical Activity: Not on file  Stress: Not on file  Social Connections: Not on file    Allergies:  Allergies  Allergen Reactions  . Aspirin Nausea And Vomiting  . Hctz [Hydrochlorothiazide] Other (See Comments)    Nausea, cramping  . Tomato Itching and Swelling    Metabolic Disorder Labs: Lab Results  Component Value Date   HGBA1C 5.2 01/26/2020   MPG 103 01/26/2020   MPG 103 05/12/2019   No results found for: PROLACTIN Lab Results  Component Value Date   CHOL 207 (H) 01/26/2020   TRIG 55 01/26/2020   HDL 69 01/26/2020   CHOLHDL 3.0 01/26/2020   VLDL 11 04/27/2016   LDLCALC 123 (H) 01/26/2020   LDLCALC 109 (H) 05/12/2019   Lab Results  Component Value Date   TSH 4.72 (H) 08/24/2019   TSH 7.71 (H) 05/12/2019    Therapeutic Level Labs: No results found for: LITHIUM No results found for: VALPROATE No components found for:  CBMZ  Current Medications: Current Outpatient Medications   Medication Sig Dispense Refill  . amLODipine-valsartan (EXFORGE) 5-160 MG tablet Take 1 tablet by mouth daily. 90 tablet 1  . amoxicillin (AMOXIL) 500 MG capsule Take 500 mg by mouth 3 (three) times daily.    . baclofen (LIORESAL) 10 MG tablet Take 1 tablet (10 mg total) by mouth 3 (three) times daily as needed for muscle spasms. 30 tablet 0  . EUTHYROX 50 MCG tablet Take 50 mcg by mouth daily.    . meloxicam (MOBIC) 15 MG tablet Take 1 tablet (15 mg total) by mouth daily. 90 tablet 1  . sertraline (ZOLOFT) 100 MG tablet 125 mg daily.  Take along with 25 mg. 30 tablet 2  . sertraline (ZOLOFT) 25 MG tablet 125 mg daily. Take along with 100 mg tab 30 tablet 2  . vitamin B-12 1000 MCG tablet Take 1 tablet (1,000 mcg total) by mouth daily. 30 tablet 0   No current facility-administered medications for this visit.     Musculoskeletal: Strength & Muscle Tone: N/A Gait & Station: N/A Patient leans: N/A  Psychiatric Specialty Exam: Review of Systems  Psychiatric/Behavioral: Positive for decreased concentration, dysphoric mood and sleep disturbance. Negative for agitation, behavioral problems, confusion, hallucinations, self-injury and suicidal ideas. The patient is not nervous/anxious and is not hyperactive.   All other systems reviewed and are negative.   There were no vitals taken for this visit.There is no height or weight on file to calculate BMI.  General Appearance: Fairly Groomed  Eye Contact:  Good  Speech:  Clear and Coherent  Volume:  Normal  Mood:  good  Affect:  Appropriate, Congruent and euthymic  Thought Process:  Coherent  Orientation:  Full (Time, Place, and Person)  Thought Content: Logical   Suicidal Thoughts:  No  Homicidal Thoughts:  No  Memory:  Immediate;   Good  Judgement:  Good  Insight:  Good  Psychomotor Activity:  Normal  Concentration:  Concentration: Good and Attention Span: Good  Recall:  Good  Fund of Knowledge: Good  Language: Good  Akathisia:  No   Handed:  Right  AIMS (if indicated): not done  Assets:  Communication Skills Desire for Improvement  ADL's:  Intact  Cognition: WNL  Sleep:  Fair   Screenings: GAD-7   Flowsheet Row Office Visit from 01/21/2020 in [facility] Office Visit from 07/22/2017 in [facility]  Total GAD-7 Score 3 3    PHQ2-9   Flowsheet Row Video Visit from 05/31/2020 in [place] Office Visit from 05/04/2020 in [facility] Video Visit from 03/01/2020 in [place] Office Visit from 01/21/2020 in [facility] Office Visit from 07/14/2019 in [facility]  PHQ-2 Total Score 2 3 2 2 2   PHQ-9 Total Score 6 6 8 10 9     Flowsheet Row Video Visit from 03/01/2020 in [place] No Risk       Assessment and Plan:  SKYLINN VIALPANDO is a 55 y.o. year old female with a history of  depression, hypertension,dilated cardiomyopathy,adrenal adenoma (undergoing evaluation),bilateral pulmonary embolism, obesity, obstructive sleep apnea on CPAP, history of toxic multinodular goiter, who presents for follow up appointment for below.   1. Mild episode of recurrent major depressive disorder ([place]) There has been overall improvement in depressive symptoms since the last visit, which coincided with her having the possibility of starting a job.  Psychosocial stressors includes her mother-in-law, who recently had removal of bladder, and financial strain.  Will continue current dose of sertraline to target depression.   Plan 1. Continue sertraline 125mg  at night 2. Next appointment: 8/29 at 1:20 for 20 mins, video    The patient demonstrates the following risk factors for suicide: Chronic risk factors for suicide include:psychiatric disorder ofdepression. Acute risk factorsfor suicide include: unemployment and loss  (financial, interpersonal, professional). Protective factorsfor this patient include: positive social support and hope for the future. Considering these factors, the overall suicide risk at this point appears to below. Patientisappropriate for outpatient follow up.   Madeline Clay, MD 05/31/2020, 1:41 PM

## 2020-05-31 ENCOUNTER — Telehealth (INDEPENDENT_AMBULATORY_CARE_PROVIDER_SITE_OTHER): Payer: Medicaid Other | Admitting: Psychiatry

## 2020-05-31 ENCOUNTER — Other Ambulatory Visit: Payer: Self-pay

## 2020-05-31 ENCOUNTER — Encounter: Payer: Self-pay | Admitting: Psychiatry

## 2020-05-31 DIAGNOSIS — F330 Major depressive disorder, recurrent, mild: Secondary | ICD-10-CM | POA: Diagnosis not present

## 2020-05-31 MED ORDER — SERTRALINE HCL 100 MG PO TABS: ORAL_TABLET | ORAL | 2 refills | Status: DC

## 2020-05-31 MED ORDER — SERTRALINE HCL 25 MG PO TABS: ORAL_TABLET | ORAL | 2 refills | Status: DC

## 2020-05-31 NOTE — Patient Instructions (Signed)
1. Continue sertraline 125mg  at night 2. Next appointment: 8/29

## 2020-08-25 NOTE — Progress Notes (Signed)
Virtual Visit via Video Note  I connected with Madeline Dominguez on 08/29/20 at  1:20 PM EDT by a video enabled telemedicine application and verified that I am speaking with the correct person using two identifiers.  Location: Patient: home Provider: office Persons participated in the visit- patient, provider    I discussed the limitations of evaluation and management by telemedicine and the availability of in person appointments. The patient expressed understanding and agreed to proceed.    I discussed the assessment and treatment plan with the patient. The patient was provided an opportunity to ask questions and all were answered. The patient agreed with the plan and demonstrated an understanding of the instructions.   The patient was advised to call back or seek an in-person evaluation if the symptoms worsen or if the condition fails to improve as anticipated.  I provided 12 minutes of non-face-to-face time during this encounter.   [name], MD    [facility] MD/PA/NP OP Progress Note  08/29/2020 1:39 PM Madeline Dominguez  MRN:  [id number]  Chief Complaint:  Chief Complaint   Follow-up; Depression    HPI:  This is a follow-up appointment for depression.  She states that she is not doing good for the past week.  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  She had a job interview last week, and is hoping to hear back from them soon.  Her husband has been also out of work.  He works as a Administrator, and his truck was on fire due to the accident.  She was concerned about what would have happen if he were to get injured.  Despite all of these things, she thinks she has been handling things relatively well, and reports much improvement in her mood compared to several months ago.  She has depressive symptoms as in PHQ-9.  She denies SI.  She wants to stay on the current medication at this time.    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 Her husband was incarcerated in 01/2016  for robbery (violated parol). She was a single parent for 25 years.  Marital status: married  Number of children: 53, age 48, 4, 83  Visit Diagnosis:    ICD-10-CM   1. Mild episode of recurrent major depressive disorder ([place])  F33.0       Past Psychiatric History: Please see initial evaluation for full details. I have reviewed the history. No updates at this time.     Past Medical History:  Past Medical History:  Diagnosis Date   Adrenal adenoma    a. normal cortisol and aldosterone levels   Cough 07/25/2015   Dilated cardiomyopathy ([place])    a. TTE 2/15: EF of 55-60%, dilated LV & LA, and nl RV; b. TTE 6/16:EF 55-60%, no RWMA, nl LV dias fxn, LV cavity size nl with mild concentric LVH, mildly dilated ascending aorta, LA nl size, RV sys fxn nl, PASP nl; c. TTE 7/17: EF 45-50%, unable to exclude RWMA, nl LV dias faxn, mildly dilated RV w/ nl sys fxn, PASP nl; d. TTE 7/18: EF 45-50%, mild LVH, Gr1DD, mildly dilated LA, mildly dilated RV   Goiter    a. s/p radioactive iodine  treatment in 2010   Heart palpitations    Hypertension    Iron deficiency    Large breasts    Morbid obesity ([place])    Postpartum hemorrhage    Pulmonary embolism ([place]) 07/2015   a. s/p Eliquis; b. hypercoag work up neg; c. felt to be 2/2 OCP    Past Surgical History:  Procedure Laterality Date   CESAREAN SECTION     26 years ago - repaired bleeding after vaginal birth    Family Psychiatric History: Please see initial evaluation for full details. I have reviewed the history. No updates at this time.     Family History:  Family History  Problem Relation Age of Onset   Hypertension Mother    CVA Father    Deep vein thrombosis Brother    Pulmonary  embolism Brother    Anemia Sister     Social History:  Social History   Socioeconomic History   Marital status: Married    Spouse name: Not on file   Number of children: 3   Years of education: Not on file   Highest education level: High school graduate  Occupational History   Not on file  Tobacco Use   Smoking status: Never   Smokeless tobacco: Never  Vaping Use   Vaping Use: Never used  Substance and Sexual Activity   Alcohol use: No    Alcohol/week: 0.0 standard drinks    Comment: rarely   Drug use: No   Sexual activity: Not Currently    Partners: Male    Birth control/protection: Condom  Other Topics Concern   Not on file  Social History Narrative   Lives with 3 with 3 daughters, husband was incarcerated April 2018 - for stealing, she has not been keeping in touch with him because she is disappointed. 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    Social Determinants of Health   Financial Resource Strain: Not on file  Food Insecurity: Not on file  Transportation Needs: Not on file  Physical Activity: Not on file  Stress: Not on file  Social Connections: Not on file    Allergies:  Allergies  Allergen Reactions   Aspirin Nausea And Vomiting   Hctz [Hydrochlorothiazide] Other (See Comments)    Nausea, cramping   Tomato Itching and Swelling    Metabolic Disorder Labs: Lab Results  Component Value Date   HGBA1C 5.2 01/26/2020   MPG 103 01/26/2020   MPG 103 05/12/2019   No results found for: PROLACTIN Lab Results  Component Value Date   CHOL 207 (H) 01/26/2020   TRIG 55 01/26/2020   HDL 69 01/26/2020   CHOLHDL 3.0 01/26/2020   VLDL 11 04/27/2016   LDLCALC 123 (H) 01/26/2020   LDLCALC 109 (H) 05/12/2019   Lab Results  Component Value Date   TSH 4.72 (H) 08/24/2019   TSH 7.71 (H) 05/12/2019    Therapeutic Level  Labs: No results found for: LITHIUM No results found for: VALPROATE No components found for:  CBMZ  Current Medications: Current Outpatient Medications  Medication Sig Dispense Refill   amLODipine-valsartan (EXFORGE) 5-160 MG tablet Take 1 tablet by mouth daily. 90 tablet 1   amoxicillin (AMOXIL) 500 MG capsule Take 500 mg by mouth 3 (three) times daily.     baclofen (LIORESAL) 10 MG tablet Take 1 tablet (  10 mg total) by mouth 3 (three) times daily as needed for muscle spasms. 30 tablet 0   EUTHYROX 50 MCG tablet Take 50 mcg by mouth daily.     meloxicam (MOBIC) 15 MG tablet Take 1 tablet (15 mg total) by mouth daily. 90 tablet 1   [START ON 09/30/2020] sertraline (ZOLOFT) 100 MG tablet 125 mg daily. Take along with 25 mg. 30 tablet 1   sertraline (ZOLOFT) 25 MG tablet 125 mg daily. Take along with 100 mg tab 30 tablet 2   vitamin B-12 1000 MCG tablet Take 1 tablet (1,000 mcg total) by mouth daily. 30 tablet 0   No current facility-administered medications for this visit.     Musculoskeletal: Strength & Muscle Tone:  N/A Gait & Station:  N/A Patient leans: N/A  Psychiatric Specialty Exam: Review of Systems  Psychiatric/Behavioral:  Positive for decreased concentration, dysphoric mood and sleep disturbance. Negative for agitation, behavioral problems, confusion, hallucinations, self-injury and suicidal ideas. The patient is nervous/anxious. The patient is not hyperactive.   All other systems reviewed and are negative.  There were no vitals taken for this visit.There is no height or weight on file to calculate BMI.  General Appearance: Fairly Groomed  Eye Contact:  Good  Speech:  Clear and Coherent  Volume:  Normal  Mood:   not good  Affect:  Appropriate, Congruent, and calm, reactive  Thought Process:  Coherent  Orientation:  Full (Time, Place, and Person)  Thought Content: Logical   Suicidal Thoughts:  No  Homicidal Thoughts:  No  Memory:  Immediate;   Good  Judgement:  Good   Insight:  Good  Psychomotor Activity:  Normal  Concentration:  Concentration: Good and Attention Span: Good  Recall:  Good  Fund of Knowledge: Good  Language: Good  Akathisia:  No  Handed:  Right  AIMS (if indicated): not done  Assets:  Communication Skills Desire for Improvement  ADL's:  Intact  Cognition: WNL  Sleep:  Poor   Screenings: GAD-7    Flowsheet Row Office Visit from 01/21/2020 in [facility] Office Visit from 07/22/2017 in [facility]  Total GAD-7 Score 3 3      PHQ2-9    Flowsheet Row Video Visit from 08/29/2020 in [place] Video Visit from 05/31/2020 in [place] Visit from 05/04/2020 in [facility] Video Visit from 03/01/2020 in [place] Office Visit from 01/21/2020 in [facility]  PHQ-2 Total Score '3 2 3 2 2  '$ PHQ-9 Total Score '8 6 6 8 10      '$ Flowsheet Row Video Visit from 03/01/2020 in [place] No Risk        Assessment and Plan:  Madeline Dominguez is a 55 y.o. year old female with a history of depression, hypertension, dilated cardiomyopathy, adrenal adenoma (undergoing evaluation), bilateral pulmonary embolism, obesity, obstructive sleep apnea on CPAP, history of toxic multinodular goiter, who presents for follow up appointment for below.   1. Mild episode of recurrent major depressive disorder ([place]) Although she reports slight worsening in depressive symptoms in the context of being laid off from work and recent incident of her husband, she has been handling things relatively well.  Other psychosocial stressors includes her mother-in-law, who recently had removal of bladder, and financial strain.  She is not interested in medication adjustment at this time.  We will continue current dose of  sertraline to target depression.     Plan 1. Continue sertraline 125 mg at night 2. Next appointment: 11/22 at 1:40 for 20 mins, video       The patient demonstrates the following risk factors for suicide: Chronic risk factors for suicide include: psychiatric disorder of depression. Acute risk factors for suicide include: unemployment and loss (financial, interpersonal, professional). Protective factors for this patient include: positive social support and hope for the future. Considering these factors, the overall suicide risk at this point appears to be low.        [name], MD 08/29/2020, 1:39 PM

## 2020-08-29 ENCOUNTER — Other Ambulatory Visit: Payer: Self-pay

## 2020-08-29 ENCOUNTER — Telehealth (INDEPENDENT_AMBULATORY_CARE_PROVIDER_SITE_OTHER): Payer: Medicaid Other | Admitting: Psychiatry

## 2020-08-29 ENCOUNTER — Encounter: Payer: Self-pay | Admitting: Psychiatry

## 2020-08-29 DIAGNOSIS — F330 Major depressive disorder, recurrent, mild: Secondary | ICD-10-CM | POA: Diagnosis not present

## 2020-08-29 MED ORDER — SERTRALINE HCL 100 MG PO TABS: ORAL_TABLET | ORAL | 1 refills | Status: DC

## 2020-08-29 MED ORDER — SERTRALINE HCL 25 MG PO TABS: ORAL_TABLET | ORAL | 2 refills | Status: DC

## 2020-08-29 NOTE — Patient Instructions (Signed)
1. Continue sertraline 125 mg at night 2. Next appointment: 11/22 at 1:40

## 2020-09-29 ENCOUNTER — Ambulatory Visit (INDEPENDENT_AMBULATORY_CARE_PROVIDER_SITE_OTHER): Payer: Medicaid Other | Admitting: Family Medicine

## 2020-09-29 ENCOUNTER — Encounter: Payer: Self-pay | Admitting: Family Medicine

## 2020-09-29 ENCOUNTER — Other Ambulatory Visit: Payer: Self-pay

## 2020-09-29 ENCOUNTER — Other Ambulatory Visit (HOSPITAL_COMMUNITY)
Admission: RE | Admit: 2020-09-29 | Discharge: 2020-09-29 | Disposition: A | Discharge status: Home / Self Care | Payer: Medicaid Other | Source: Ambulatory Visit | Attending: Family Medicine | Admitting: Family Medicine

## 2020-09-29 VITALS — BP 132/86 | HR 86 | Temp 98.0°F | Resp 16 | Ht 67.0 in | Wt 386.0 lb

## 2020-09-29 DIAGNOSIS — Z23 Encounter for immunization: Secondary | ICD-10-CM

## 2020-09-29 DIAGNOSIS — Z124 Encounter for screening for malignant neoplasm of cervix: Secondary | ICD-10-CM | POA: Diagnosis not present

## 2020-09-29 DIAGNOSIS — Z1211 Encounter for screening for malignant neoplasm of colon: Secondary | ICD-10-CM | POA: Diagnosis not present

## 2020-09-29 DIAGNOSIS — Z1231 Encounter for screening mammogram for malignant neoplasm of breast: Secondary | ICD-10-CM

## 2020-09-29 DIAGNOSIS — Z0000 Encounter for general adult medical examination without abnormal findings: Secondary | ICD-10-CM | POA: Diagnosis not present

## 2020-09-29 MED ORDER — VITAMIN D 50 MCG (2000 UT) PO CAPS: 1.0000 | ORAL_CAPSULE | Freq: Every day | ORAL | 0 refills | Status: AC

## 2020-09-29 NOTE — Patient Instructions (Signed)
Preventive Care 40-53 Years Old, Female Preventive care refers to lifestyle choices and visits with your health care provider that can promote health and wellness. This includes: A yearly physical exam. This is also called an annual wellness visit. Regular dental and eye exams. Immunizations. Screening for certain conditions. Healthy lifestyle choices, such as: Eating a healthy diet. Getting regular exercise. Not using drugs or products that contain nicotine and tobacco. Limiting alcohol use. What can I expect for my preventive care visit? Physical exam Your health care provider will check your: Height and weight. These may be used to calculate your BMI (body mass index). BMI is a measurement that tells if you are at a healthy weight. Heart rate and blood pressure. Body temperature. Skin for abnormal spots. Counseling Your health care provider may ask you questions about your: Past medical problems. Family's medical history. Alcohol, tobacco, and drug use. Emotional well-being. Home life and relationship well-being. Sexual activity. Diet, exercise, and sleep habits. Work and work environment. Access to firearms. Method of birth control. Menstrual cycle. Pregnancy history. What immunizations do I need? Vaccines are usually given at various ages, according to a schedule. Your health care provider will recommend vaccines for you based on your age, medical history, and lifestyle or other factors, such as travel or where you work. What tests do I need? Blood tests Lipid and cholesterol levels. These may be checked every 5 years, or more often if you are over 50 years old. Hepatitis C test. Hepatitis B test. Screening Lung cancer screening. You may have this screening every year starting at age 55 if you have a 30-pack-year history of smoking and currently smoke or have quit within the past 15 years. Colorectal cancer screening. All adults should have this screening starting at  age 50 and continuing until age 75. Your health care provider may recommend screening at age 45 if you are at increased risk. You will have tests every 1-10 years, depending on your results and the type of screening test. Diabetes screening. This is done by checking your blood sugar (glucose) after you have not eaten for a while (fasting). You may have this done every 1-3 years. Mammogram. This may be done every 1-2 years. Talk with your health care provider about when you should start having regular mammograms. This may depend on whether you have a family history of breast cancer. BRCA-related cancer screening. This may be done if you have a family history of breast, ovarian, tubal, or peritoneal cancers. Pelvic exam and Pap test. This may be done every 3 years starting at age 21. Starting at age 30, this may be done every 5 years if you have a Pap test in combination with an HPV test. Other tests STD (sexually transmitted disease) testing, if you are at risk. Bone density scan. This is done to screen for osteoporosis. You may have this scan if you are at high risk for osteoporosis. Talk with your health care provider about your test results, treatment options, and if necessary, the need for more tests. Follow these instructions at home: Eating and drinking  Eat a diet that includes fresh fruits and vegetables, whole grains, lean protein, and low-fat dairy products. Take vitamin and mineral supplements as recommended by your health care provider. Do not drink alcohol if: Your health care provider tells you not to drink. You are pregnant, may be pregnant, or are planning to become pregnant. If you drink alcohol: Limit how much you have to 0-1 drink a day. Be   aware of how much alcohol is in your drink. In the [place] one drink equals one 12 oz bottle of beer (355 mL), one 5 oz glass of wine (148 mL), or one 1 oz glass of hard liquor (44 mL). Lifestyle Take daily care of your teeth and  gums. Brush your teeth every morning and night with fluoride toothpaste. Floss one time each day. Stay active. Exercise for at least 30 minutes 5 or more days each week. Do not use any products that contain nicotine or tobacco, such as cigarettes, e-cigarettes, and chewing tobacco. If you need help quitting, ask your health care provider. Do not use drugs. If you are sexually active, practice safe sex. Use a condom or other form of protection to prevent STIs (sexually transmitted infections). If you do not wish to become pregnant, use a form of birth control. If you plan to become pregnant, see your health care provider for a prepregnancy visit. If told by your health care provider, take low-dose aspirin daily starting at age 63. Find healthy ways to cope with stress, such as: Meditation, yoga, or listening to music. Journaling. Talking to a trusted person. Spending time with friends and family. Safety Always wear your seat belt while driving or riding in a vehicle. Do not drive: If you have been drinking alcohol. Do not ride with someone who has been drinking. When you are tired or distracted. While texting. Wear a helmet and other protective equipment during sports activities. If you have firearms in your house, make sure you follow all gun safety procedures. What's next? Visit your health care provider once a year for an annual wellness visit. Ask your health care provider how often you should have your eyes and teeth checked. Stay up to date on all vaccines. This information is not intended to replace advice given to you by your health care provider. Make sure you discuss any questions you have with your health care provider. Document Revised: 02/26/2020 Document Reviewed: 08/29/2017 Elsevier Patient Education  2022 [employer].

## 2020-09-29 NOTE — Progress Notes (Signed)
Name: Madeline Dominguez   MRN: [id number]    DOB: 07-07-1965   Date:09/29/2020       Progress Note  Subjective  Chief Complaint  Annual Exam  HPI  Patient presents for annual CPE.  Diet: she is drinking 2 cans of sodas per day , discussed having enough calcium per day   Exercise: discussed 150 minutes per week   [place] Office Visit from 01/21/2020 in [facility]  AUDIT-C Score 0      Depression: Phq 9 is  negative Depression screen [facility] 2/9 09/29/2020 05/04/2020 01/21/2020 07/14/2019 05/12/2019  Decreased Interest _0 Down, Depressed, Hopeless _1 0  PHQ - 2 Score _2 Altered sleeping _3 0  Tired, decreased energy _4 0  Change in appetite 0 _5 0  Feeling bad or failure about yourself  0 0 0 0 0  Trouble concentrating 1 0 2 1 0  Moving slowly or fidgety/restless 0 0 0 1 0  Suicidal thoughts 0 0 0 0 0  PHQ-9 Score _6 Difficult doing work/chores - - Not difficult at all Somewhat difficult Not difficult at all  Some encounter information is confidential and restricted. Go to Review Flowsheets activity to see all data.  Some recent data might be hidden   Hypertension: BP Readings from Last 3 Encounters:  09/29/20 132/86  05/04/20 136/84  01/28/20 134/82   Obesity: Wt Readings from Last 3 Encounters:  09/29/20 (!) 386 lb (175.1 kg)  05/04/20 (!) 381 lb (172.8 kg)  01/28/20 (!) 382 lb 14.4 oz (173.7 kg)   BMI Readings from Last 3 Encounters:  09/29/20 60.46 kg/m  05/04/20 59.67 kg/m  01/28/20 59.97 kg/m     Vaccines:   Shingrix: 86-64 yo and ask insurance if covered when patient above 63 yo - she wants to hold off for now  Pneumonia: educated and discussed with patient. Flu: educated and discussed with patient.  Hep C Screening: 01/26/20 STD testing and prevention (HIV/chl/gon/syphilis): 12/20/15 Intimate partner violence: negative Sexual History : occasionally with husband, no pain during sex or  post-coital bleeding  Menstrual History/LMP/Abnormal Bleeding: she is post-menopausal and discussed need for follow up if any vaginal bleeding  Incontinence Symptoms: occasionally has stress incontinence   Breast cancer:  - Last Mammogram: ordered 01/28/20 - BRCA gene screening: N/A  Osteoporosis: Discussed high calcium and vitamin D supplementation, weight bearing exercises  Cervical cancer screening: today  Skin cancer: Discussed monitoring for atypical lesions  Colorectal cancer: getting FIT yearly  Lung cancer: Low Dose CT Chest recommended if Age 50-80 years, 20 pack-year currently smoking OR have quit w/in 15years. Patient does not qualify.   ECG: 08/02/16  Advanced Care Planning: A voluntary discussion about advance care planning including the explanation and discussion of advance directives.  Discussed health care proxy and Living will, and the patient was able to identify a health care proxy as Dorquita - daughter .    Lipids: Lab Results  Component Value Date   CHOL 207 (H) 01/26/2020   CHOL 201 (H) 05/12/2019   CHOL 211 (H) 01/24/2018   Lab Results  Component Value Date   HDL 69 01/26/2020   HDL 76 05/12/2019   HDL 73 01/24/2018   Lab Results  Component Value Date   LDLCALC 123 (H) 01/26/2020   LDLCALC 109 (H) 05/12/2019   LDLCALC 124 (  H) 01/24/2018   Lab Results  Component Value Date   TRIG 55 01/26/2020   TRIG 72 05/12/2019   TRIG 47 01/24/2018   Lab Results  Component Value Date   CHOLHDL 3.0 01/26/2020   CHOLHDL 2.6 05/12/2019   CHOLHDL 2.9 01/24/2018   No results found for: LDLDIRECT  Glucose: Glucose, Bld  Date Value Ref Range Status  01/26/2020 127 (H) 65 - 99 mg/dL Final    Comment:    .            Fasting reference interval . For someone without known diabetes, a glucose value >125 mg/dL indicates that they may have diabetes and this should be confirmed with a follow-up test. .   05/12/2019 98 65 - 99 mg/dL Final    Comment:    .             Fasting reference interval .   01/24/2018 101 (H) 65 - 99 mg/dL Final    Comment:    .            Fasting reference interval . For someone without known diabetes, a glucose value between 100 and 125 mg/dL is consistent with prediabetes and should be confirmed with a follow-up test. .    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[place]) 07/10/2016   History of radioactive iodine thyroid ablation    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   Iron deficiency anemia 07/27/2015   B12 deficiency 07/27/2015   History of pulmonary embolus (PE) 07/25/2015   Adrenal nodule ([place]) 07/25/2015   Pulmonary nodules/lesions, multiple 07/25/2015   OSA (obstructive sleep apnea) 05/18/2015   Thyroid nodule 12/17/2014   Gastroesophageal reflux disease without esophagitis 09/02/2014   Morbid obesity, unspecified obesity type ([place]) 06/22/2014   Large breasts 06/22/2014   Anemia, iron deficiency 06/22/2014   H/O hyperthyroidism 06/22/2014   Right ventricular dilation 06/22/2014   Goiter, toxic, multinodular 05/22/2013   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    Past Surgical History:  Procedure Laterality Date   CESAREAN SECTION     26 years ago - repaired bleeding after vaginal birth    Family History  Problem Relation Age of Onset   Hypertension Mother    CVA Father    Deep vein thrombosis Brother    Pulmonary embolism Brother    Anemia Sister     Social  History   Socioeconomic History   Marital status: Married    Spouse name: Not on file   Number of children: 3   Years of education: Not on file   Highest education level: High school graduate  Occupational History   Not on file  Tobacco Use   Smoking status: Never   Smokeless tobacco: Never  Vaping Use   Vaping Use: Never used  Substance and Sexual Activity   Alcohol use: No    Alcohol/week: 0.0 standard drinks    Comment: rarely   Drug use: No   Sexual activity: Not Currently  Partners: Male    Birth control/protection: Condom  Other Topics Concern   Not on file  Social History Narrative   Lives with 3 with 3 daughters, husband was incarcerated April 2018 - for stealing, she has not been keeping in touch with him because she is disappointed. 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    Social Determinants of Health   Financial Resource Strain: High Risk   Difficulty of Paying Living Expenses: Hard  Food Insecurity: Food Insecurity Present   Worried About Charity fundraiser in the Last Year: Sometimes true   Arboriculturist in the Last Year: Sometimes true  Transportation Needs: No Transportation Needs   Lack of Transportation (Medical): No   Lack of Transportation (Non-Medical): No  Physical Activity: Insufficiently Active   Days of Exercise per Week: 2 days   Minutes of Exercise per Session: 20 min  Stress: Stress Concern Present   Feeling of Stress : Very much  Social Connections: Socially Integrated   Frequency of Communication with Friends and Family: More than three times a week   Frequency of Social Gatherings with Friends and Family: Three times a week   Attends Religious Services: More than 4 times per year   Active Member of Clubs or Organizations: Yes   Attends Archivist Meetings: 1 to 4 times per year   Marital  Status: Married  Human resources officer Violence: Not At Risk   Fear of Current or Ex-Partner: No   Emotionally Abused: No   Physically Abused: No   Sexually Abused: No     Current Outpatient Medications:    amLODipine-valsartan (EXFORGE) 5-160 MG tablet, Take 1 tablet by mouth daily., Disp: 90 tablet, Rfl: 1   amoxicillin (AMOXIL) 500 MG capsule, Take 500 mg by mouth 3 (three) times daily., Disp: , Rfl:    baclofen (LIORESAL) 10 MG tablet, Take 1 tablet (10 mg total) by mouth 3 (three) times daily as needed for muscle spasms., Disp: 30 tablet, Rfl: 0   EUTHYROX 50 MCG tablet, Take 50 mcg by mouth daily., Disp: , Rfl:    meloxicam (MOBIC) 15 MG tablet, Take 1 tablet (15 mg total) by mouth daily., Disp: 90 tablet, Rfl: 1   [START ON 09/30/2020] sertraline (ZOLOFT) 100 MG tablet, 125 mg daily. Take along with 25 mg., Disp: 30 tablet, Rfl: 1   sertraline (ZOLOFT) 25 MG tablet, 125 mg daily. Take along with 100 mg tab, Disp: 30 tablet, Rfl: 2   vitamin B-12 1000 MCG tablet, Take 1 tablet (1,000 mcg total) by mouth daily., Disp: 30 tablet, Rfl: 0  Allergies  Allergen Reactions   Aspirin Nausea And Vomiting   Hctz [Hydrochlorothiazide] Other (See Comments)    Nausea, cramping   Tomato Itching and Swelling     ROS  Constitutional: Negative for fever or weight change.  Respiratory: Negative for cough and shortness of breath.   Cardiovascular: Negative for chest pain or palpitations.  Gastrointestinal: Negative for abdominal pain, no bowel changes.  Musculoskeletal: Negative for gait problem or joint swelling.  Skin: Negative for rash.  Neurological: Negative for dizziness or headache.  No other specific complaints in a complete review of systems (except as listed in HPI above).   Objective  Vitals:   09/29/20 0928  BP: 132/86  Pulse: 86  Resp: 16  Temp: 98 F (36.7 C)  SpO2: 98%  Weight: (!) 386 lb (175.1 kg)  Height: _0  (1.702 m)    Body mass index is 60.46  kg/m.  Physical Exam  Constitutional: Patient appears well-developed and well-nourished. No distress.  HENT: Head: Normocephalic and atraumatic. Ears: B TMs ok, no erythema or effusion; Nose: Nose normal. Mouth/Throat: not done  Eyes: Conjunctivae and EOM are normal. Pupils are equal, round, and reactive to light. No scleral icterus.  Neck: Normal range of motion. Neck supple. No JVD present. No thyromegaly present.  Cardiovascular: Normal rate, regular rhythm and normal heart sounds.  No murmur heard. No BLE edema. Pulmonary/Chest: Effort normal and breath sounds normal. No respiratory distress. Abdominal: Soft. Bowel sounds are normal, no distension. There is no tenderness. no masses Breast: no lumps or masses, no nipple discharge , small areas of erythema and recent drainage on left upper inner quadrant, she states it was larger ( started a couple of days ago) and is resolving, we will monitor for now, seems to be resolving  FEMALE GENITALIA:  External genitalia normal External urethra normal Vaginal vault normal without discharge or lesions Cervix: no visualized - blind collection  Bimanual exam normal without masses RECTAL: not done  Musculoskeletal: Normal range of motion, no joint effusions. No gross deformities Neurological: he is alert and oriented to person, place, and time. No cranial nerve deficit. Coordination, balance, strength, speech and gait are normal.  Skin: Skin is warm and dry. No rash noted. No erythema.  Psychiatric: Patient has a normal mood and affect. behavior is normal. Judgment and thought content normal.     Fall Risk: Fall Risk  09/29/2020 05/04/2020 01/21/2020 07/14/2019 05/12/2019  Falls in the past year? 0 0 0 0 0  Number falls in past yr: 0 0 0 0 0  Injury with Fall? 0 0 0 0 0  Risk for fall due to : No Fall Risks - - - -  Follow up Falls prevention discussed - - - Falls evaluation completed     Functional Status Survey: Is the patient deaf or have  difficulty hearing?: No Does the patient have difficulty seeing, even when wearing glasses/contacts?: No Does the patient have difficulty concentrating, remembering, or making decisions?: Yes Does the patient have difficulty walking or climbing stairs?: Yes Does the patient have difficulty dressing or bathing?: No Does the patient have difficulty doing errands alone such as visiting a doctor's office or shopping?: No   Assessment & Plan  1. Well adult exam  - Cholecalciferol (VITAMIN D) 50 MCG (2000 UT) CAPS; Take 1 capsule (2,000 Units total) by mouth daily.  Dispense: 30 capsule; Refill: 0  2. Cervical cancer screening  - Cytology - PAP  3. Encounter for screening mammogram for malignant neoplasm of breast  - MM 3D SCREEN BREAST BILATERAL; Future  4. Colon cancer screening  - Ambulatory referral to Gastroenterology  5. Needs flu shot  - Flu Vaccine QUAD 6+ mos PF IM (Fluarix Quad PF)  6. Need for shingles vaccine   7. Need for vaccination for pneumococcus  - Pneumococcal conjugate vaccine 20-valent (Prevnar 20)   -USPSTF grade A and B recommendations reviewed with patient; age-appropriate recommendations, preventive care, screening tests, etc discussed and encouraged; healthy living encouraged; see AVS for patient education given to patient -Discussed importance of 150 minutes of physical activity weekly, eat two servings of fish weekly, eat one serving of tree nuts ( cashews, pistachios, pecans, almonds.Marland Kitchen) every other day, eat 6  servings of fruit/vegetables daily and drink plenty of water and avoid sweet beverages.

## 2020-10-03 ENCOUNTER — Telehealth: Payer: Self-pay

## 2020-10-03 LAB — CYTOLOGY - PAP
Adequacy: ABSENT
Comment: NEGATIVE
Diagnosis: NEGATIVE
High risk HPV: NEGATIVE

## 2020-10-03 NOTE — Progress Notes (Signed)
Unable to contact patient will mail letter out.

## 2020-10-03 NOTE — Telephone Encounter (Signed)
Pt. Calling to schedule colonoscopy 

## 2020-10-05 ENCOUNTER — Other Ambulatory Visit: Payer: Self-pay

## 2020-10-05 DIAGNOSIS — Z800 Family history of malignant neoplasm of digestive organs: Secondary | ICD-10-CM

## 2020-10-05 DIAGNOSIS — Z1211 Encounter for screening for malignant neoplasm of colon: Secondary | ICD-10-CM

## 2020-10-05 MED ORDER — PEG 3350-KCL-NA BICARB-NACL 420 G PO SOLR: 4000.0000 mL | Freq: Once | ORAL | 0 refills | Status: AC

## 2020-10-05 NOTE — Telephone Encounter (Signed)
Procedure has been scheduled for 10/25/20.

## 2020-10-05 NOTE — Progress Notes (Signed)
Gastroenterology Pre-Procedure Review  Request Date: 10/25/20 Requesting Physician: Dr. Vicente Males  PATIENT REVIEW QUESTIONS: The patient responded to the following health history questions as indicated:    1. Are you having any GI issues? no 2. Do you have a personal history of Polyps? no 3. Do you have a family history of Colon Cancer or Polyps? yes (Brother colon cancer) 4. Diabetes Mellitus? no 5. Joint replacements in the past 12 months?no 6. Major health problems in the past 3 months?no 7. Any artificial heart valves, MVP, or defibrillator?no    MEDICATIONS & ALLERGIES:    Patient reports the following regarding taking any anticoagulation/antiplatelet therapy:   Plavix, Coumadin, Eliquis, Xarelto, Lovenox, Pradaxa, Brilinta, or Effient? no Aspirin? no  Patient confirms/reports the following medications:  Current Outpatient Medications  Medication Sig Dispense Refill   amLODipine-valsartan (EXFORGE) 5-160 MG tablet Take 1 tablet by mouth daily. 90 tablet 1   amoxicillin (AMOXIL) 500 MG capsule Take 500 mg by mouth 3 (three) times daily.     baclofen (LIORESAL) 10 MG tablet Take 1 tablet (10 mg total) by mouth 3 (three) times daily as needed for muscle spasms. 30 tablet 0   Cholecalciferol (VITAMIN D) 50 MCG (2000 UT) CAPS Take 1 capsule (2,000 Units total) by mouth daily. 30 capsule 0   EUTHYROX 50 MCG tablet Take 50 mcg by mouth daily.     meloxicam (MOBIC) 15 MG tablet Take 1 tablet (15 mg total) by mouth daily. 90 tablet 1   sertraline (ZOLOFT) 100 MG tablet 125 mg daily. Take along with 25 mg. 30 tablet 1   sertraline (ZOLOFT) 25 MG tablet 125 mg daily. Take along with 100 mg tab 30 tablet 2   vitamin B-12 1000 MCG tablet Take 1 tablet (1,000 mcg total) by mouth daily. 30 tablet 0   No current facility-administered medications for this visit.    Patient confirms/reports the following allergies:  Allergies  Allergen Reactions   Aspirin Nausea And Vomiting   Hctz  [Hydrochlorothiazide] Other (See Comments)    Nausea, cramping   Tomato Itching and Swelling    No orders of the defined types were placed in this encounter.   AUTHORIZATION INFORMATION Primary Insurance: 1D#: Group #:  Secondary Insurance: 1D#: Group #:  SCHEDULE INFORMATION: Date: 10/25/20 Time: Location: [place]

## 2020-10-24 ENCOUNTER — Encounter: Payer: Self-pay | Admitting: Gastroenterology

## 2020-10-25 ENCOUNTER — Ambulatory Visit: Payer: Medicaid Other | Admitting: Anesthesiology

## 2020-10-25 ENCOUNTER — Encounter
Admission: RE | Disposition: A | Discharge status: Home / Self Care | Payer: Self-pay | Source: Ambulatory Visit | Attending: Gastroenterology

## 2020-10-25 ENCOUNTER — Other Ambulatory Visit: Payer: Self-pay

## 2020-10-25 ENCOUNTER — Encounter: Payer: Self-pay | Admitting: Gastroenterology

## 2020-10-25 ENCOUNTER — Ambulatory Visit
Admission: RE | Admit: 2020-10-25 | Discharge: 2020-10-25 | Disposition: A | Discharge status: Home / Self Care | Payer: Medicaid Other | Source: Ambulatory Visit | Attending: Gastroenterology | Admitting: Gastroenterology

## 2020-10-25 DIAGNOSIS — Z86711 Personal history of pulmonary embolism: Secondary | ICD-10-CM | POA: Diagnosis not present

## 2020-10-25 DIAGNOSIS — Z1211 Encounter for screening for malignant neoplasm of colon: Secondary | ICD-10-CM

## 2020-10-25 DIAGNOSIS — Z888 Allergy status to other drugs, medicaments and biological substances status: Secondary | ICD-10-CM | POA: Insufficient documentation

## 2020-10-25 DIAGNOSIS — Z823 Family history of stroke: Secondary | ICD-10-CM | POA: Diagnosis not present

## 2020-10-25 DIAGNOSIS — Z800 Family history of malignant neoplasm of digestive organs: Secondary | ICD-10-CM | POA: Insufficient documentation

## 2020-10-25 DIAGNOSIS — Z8249 Family history of ischemic heart disease and other diseases of the circulatory system: Secondary | ICD-10-CM | POA: Insufficient documentation

## 2020-10-25 DIAGNOSIS — Z91018 Allergy to other foods: Secondary | ICD-10-CM | POA: Insufficient documentation

## 2020-10-25 DIAGNOSIS — Z79899 Other long term (current) drug therapy: Secondary | ICD-10-CM | POA: Insufficient documentation

## 2020-10-25 DIAGNOSIS — G4733 Obstructive sleep apnea (adult) (pediatric): Secondary | ICD-10-CM | POA: Diagnosis not present

## 2020-10-25 DIAGNOSIS — Z6841 Body Mass Index (BMI) 40.0 and over, adult: Secondary | ICD-10-CM | POA: Insufficient documentation

## 2020-10-25 DIAGNOSIS — K5730 Diverticulosis of large intestine without perforation or abscess without bleeding: Secondary | ICD-10-CM | POA: Diagnosis not present

## 2020-10-25 DIAGNOSIS — Z886 Allergy status to analgesic agent status: Secondary | ICD-10-CM | POA: Diagnosis not present

## 2020-10-25 HISTORY — PX: COLONOSCOPY WITH PROPOFOL: SHX5780

## 2020-10-25 SURGERY — COLONOSCOPY WITH PROPOFOL
Anesthesia: General

## 2020-10-25 MED ORDER — LIDOCAINE HCL (CARDIAC) PF 100 MG/5ML IV SOSY
PREFILLED_SYRINGE | INTRAVENOUS | Status: DC | PRN
  Administered 2020-10-25: 100 mg via INTRAVENOUS

## 2020-10-25 MED ORDER — PROPOFOL 10 MG/ML IV BOLUS
INTRAVENOUS | Status: DC | PRN
  Administered 2020-10-25: 30 mg via INTRAVENOUS
  Administered 2020-10-25: 70 mg via INTRAVENOUS

## 2020-10-25 MED ORDER — DEXMEDETOMIDINE (PRECEDEX) IN NS 20 MCG/5ML (4 MCG/ML) IV SYRINGE
PREFILLED_SYRINGE | INTRAVENOUS | Status: DC | PRN
  Administered 2020-10-25: 8 ug via INTRAVENOUS

## 2020-10-25 MED ORDER — PROPOFOL 500 MG/50ML IV EMUL
INTRAVENOUS | Status: DC | PRN
  Administered 2020-10-25: 140 ug/kg/min via INTRAVENOUS

## 2020-10-25 MED ORDER — SODIUM CHLORIDE 0.9 % IV SOLN: INTRAVENOUS | Status: DC

## 2020-10-25 NOTE — H&P (Signed)
Madeline Bellows, MD [address], [place], [place], [place], [address] [address], [place], [place], [place], [address] Phone: [phone]  Fax: [phone]  Primary Care Physician:  Steele Sizer, MD   Pre-Procedure History & Physical: HPI:  Madeline Dominguez is a 55 y.o. female is here for an colonoscopy.   Past Medical History:  Diagnosis Date   Adrenal adenoma    a. normal cortisol and aldosterone levels   Cough 07/25/2015   Dilated cardiomyopathy ([place])    a. TTE 2/15: EF of 55-60%, dilated LV & LA, and nl RV; b. TTE 6/16:EF 55-60%, no RWMA, nl LV dias fxn, LV cavity size nl with mild concentric LVH, mildly dilated ascending aorta, LA nl size, RV sys fxn nl, PASP nl; c. TTE 7/17: EF 45-50%, unable to exclude RWMA, nl LV dias faxn, mildly dilated RV w/ nl sys fxn, PASP nl; d. TTE 7/18: EF 45-50%, mild LVH, Gr1DD, mildly dilated LA, mildly dilated RV   Goiter    a. s/p radioactive iodine treatment in 2010   Heart palpitations    Hypertension    Iron deficiency    Large breasts    Morbid obesity ([place])    Postpartum hemorrhage    Pulmonary embolism ([place]) 07/2015   a. s/p Eliquis; b. hypercoag work up neg; c. felt to be 2/2 OCP    Past Surgical History:  Procedure Laterality Date   LAPAROTOMY     for after childbirth    Prior to Admission medications   Medication Sig Start Date End Date Taking? Authorizing Provider  amLODipine-valsartan (EXFORGE) 5-160 MG tablet Take 1 tablet by mouth daily. 05/04/20  Yes Sowles, Drue Stager, MD  baclofen (LIORESAL) 10 MG tablet Take 1 tablet (10 mg total) by mouth 3 (three) times daily as needed for muscle spasms. 01/15/20  Yes Sowles, Drue Stager, MD  Cholecalciferol (VITAMIN D) 50 MCG (2000 UT) CAPS Take 1 capsule (2,000 Units total) by mouth daily. 09/29/20  Yes Sowles, Drue Stager, MD  EUTHYROX 50 MCG tablet Take 50 mcg by mouth daily. 03/01/20  Yes [provider]  meloxicam (MOBIC) 15 MG tablet Take 1 tablet (15 mg total) by mouth  daily. 05/04/20  Yes Sowles, Drue Stager, MD  sertraline (ZOLOFT) 100 MG tablet 125 mg daily. Take along with 25 mg. 09/30/20  Yes Hisada, Elie Goody, MD  sertraline (ZOLOFT) 25 MG tablet 125 mg daily. Take along with 100 mg tab 08/29/20  Yes Hisada, Elie Goody, MD  vitamin B-12 1000 MCG tablet Take 1 tablet (1,000 mcg total) by mouth daily. 07/27/15  Yes Wieting, Richard, MD  amoxicillin (AMOXIL) 500 MG capsule Take 500 mg by mouth 3 (three) times daily. 04/28/20   [provider]    Allergies as of 10/05/2020 - Review Complete 09/29/2020  Allergen Reaction Noted   Aspirin Nausea And Vomiting 06/03/2014   Hctz [hydrochlorothiazide] Other (See Comments) 06/22/2016   Tomato Itching and Swelling 12/02/2018    Family History  Problem Relation Age of Onset   Hypertension Mother    CVA Father    Deep vein thrombosis Brother    Pulmonary embolism Brother    Anemia Sister     Social History   Socioeconomic History   Marital status: Married    Spouse name: Not on file   Number of children: 3   Years of education: Not on file   Highest education level: High school graduate  Occupational History   Not on file  Tobacco Use   Smoking  status: Never   Smokeless tobacco: Never  Vaping Use   Vaping Use: Never used  Substance and Sexual Activity   Alcohol use: No    Alcohol/week: 0.0 standard drinks    Comment: rarely   Drug use: No   Sexual activity: Not Currently    Partners: Male    Birth control/protection: Condom  Other Topics Concern   Not on file  Social History Narrative   Lives with 3 with 3 daughters, husband was incarcerated April 2018 - for stealing, she has not been keeping in touch with him because she is disappointed. 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    Social Determinants of Health   Financial Resource Strain: High Risk   Difficulty of  Paying Living Expenses: Hard  Food Insecurity: Food Insecurity Present   Worried About Charity fundraiser in the Last Year: Sometimes true   Arboriculturist in the Last Year: Sometimes true  Transportation Needs: No Transportation Needs   Lack of Transportation (Medical): No   Lack of Transportation (Non-Medical): No  Physical Activity: Insufficiently Active   Days of Exercise per Week: 2 days   Minutes of Exercise per Session: 20 min  Stress: Stress Concern Present   Feeling of Stress : Very much  Social Connections: Socially Integrated   Frequency of Communication with Friends and Family: More than three times a week   Frequency of Social Gatherings with Friends and Family: Three times a week   Attends Religious Services: More than 4 times per year   Active Member of Clubs or Organizations: Yes   Attends Archivist Meetings: 1 to 4 times per year   Marital Status: Married  Human resources officer Violence: Not At Risk   Fear of Current or Ex-Partner: No   Emotionally Abused: No   Physically Abused: No   Sexually Abused: No    Review of Systems: See HPI, otherwise negative ROS  Physical Exam: BP (!) 157/97   Pulse 71   Temp (!) 96.9 F (36.1 C) (Temporal)   Resp 20   Ht 5\' 7"  (1.702 m)   Wt (!) 167.8 kg   SpO2 95%   BMI 57.95 kg/m  General:   Alert,  pleasant and cooperative in NAD Head:  Normocephalic and atraumatic. Neck:  Supple; no masses or thyromegaly. Lungs:  Clear throughout to auscultation, normal respiratory effort.    Heart:  +S1, +S2, Regular rate and rhythm, No edema. Abdomen:  Soft, nontender and nondistended. Normal bowel sounds, without guarding, and without rebound.   Neurologic:  Alert and  oriented x4;  grossly normal neurologically.  Impression/Plan: Madeline Dominguez is here for an colonoscopy to be performed for Screening colonoscopy, brother had colon cancer Risks, benefits, limitations, and alternatives regarding  colonoscopy have been  reviewed with the patient.  Questions have been answered.  All parties agreeable.   Madeline Bellows, MD  10/25/2020, 8:21 AM

## 2020-10-25 NOTE — Anesthesia Postprocedure Evaluation (Signed)
Anesthesia Post Note  Patient: Madeline Dominguez  Procedure(s) Performed: COLONOSCOPY WITH PROPOFOL  Patient location during evaluation: PACU Anesthesia Type: General Level of consciousness: awake and alert, oriented and patient cooperative Pain management: pain level controlled Vital Signs Assessment: post-procedure vital signs reviewed and stable Respiratory status: spontaneous breathing, nonlabored ventilation and respiratory function stable Cardiovascular status: blood pressure returned to baseline and stable Postop Assessment: adequate PO intake Anesthetic complications: no   No notable events documented.   Last Vitals:  Vitals:   10/25/20 0901 10/25/20 0911  BP:    Pulse: 72 66  Resp: 18 (!) 21  Temp:    SpO2: 97% 97%    Last Pain:  Vitals:   10/25/20 0911  TempSrc:   PainSc: 0-No pain                 Darrin Nipper

## 2020-10-25 NOTE — Transfer of Care (Signed)
Immediate Anesthesia Transfer of Care Note  Patient: Madeline Dominguez  Procedure(s) Performed: COLONOSCOPY WITH PROPOFOL  Patient Location: PACU  Anesthesia Type:General  Level of Consciousness: sedated  Airway & Oxygen Therapy: Patient Spontanous Breathing  Post-op Assessment: Report given to RN and Post -op Vital signs reviewed and stable  Post vital signs: Reviewed and stable  Last Vitals:  Vitals Value Taken Time  BP 99/57 10/25/20 0842  Temp    Pulse 64 10/25/20 0845  Resp 17 10/25/20 0845  SpO2 97 % 10/25/20 0845  Vitals shown include unvalidated device data.  Last Pain:  Vitals:   10/25/20 0841  TempSrc:   PainSc: 0-No pain         Complications: No notable events documented.

## 2020-10-25 NOTE — Anesthesia Preprocedure Evaluation (Signed)
Anesthesia Evaluation  Patient identified by MRN, date of birth, ID band Patient awake    Reviewed: Allergy & Precautions, NPO status , Patient's Chart, lab work & pertinent test results  History of Anesthesia Complications Negative for: history of anesthetic complications  Airway Mallampati: IV   Neck ROM: Full    Dental   Missing few molars:   Pulmonary sleep apnea ,    Pulmonary exam normal breath sounds clear to auscultation       Cardiovascular hypertension, Normal cardiovascular exam Rhythm:Regular Rate:Normal  Dilated cardiomyopathy; hx PE and DVT 2017, no longer on anticoagulation   Neuro/Psych PSYCHIATRIC DISORDERS Depression negative neurological ROS     GI/Hepatic negative GI ROS,   Endo/Other  Class 3 obesity  Renal/GU negative Renal ROS     Musculoskeletal   Abdominal   Peds  Hematology negative hematology ROS (+)   Anesthesia Other Findings   Reproductive/Obstetrics                             Anesthesia Physical Anesthesia Plan  ASA: 3  Anesthesia Plan: General   Post-op Pain Management:    Induction: Intravenous  PONV Risk Score and Plan: 3 and Propofol infusion, TIVA and Treatment may vary due to age or medical condition  Airway Management Planned: Natural Airway  Additional Equipment:   Intra-op Plan:   Post-operative Plan:   Informed Consent: I have reviewed the patients History and Physical, chart, labs and discussed the procedure including the risks, benefits and alternatives for the proposed anesthesia with the patient or authorized representative who has indicated his/her understanding and acceptance.       Plan Discussed with: CRNA  Anesthesia Plan Comments:         Anesthesia Quick Evaluation

## 2020-10-25 NOTE — Op Note (Addendum)
[facility] Gastroenterology Patient Name: Madeline Dominguez Procedure Date: 10/25/2020 8:23 AM MRN: [id number] Account #: [id number] Date of Birth: [birth year] Admit Type: Outpatient Age: 55 Room: [facility] ENDO ROOM 2 Gender: Female Note Status: Finalized Instrument Name: Jasper Riling [id number] Procedure:             Colonoscopy Indications:           Screening in patient at increased risk: Family history                         of 1st-degree relative with colorectal cancer Providers:             Jonathon Bellows MD, MD Medicines:             Monitored Anesthesia Care Complications:         No immediate complications. Procedure:             Pre-Anesthesia Assessment:                        - Prior to the procedure, a History and Physical was                         performed, and patient medications, allergies and                         sensitivities were reviewed. The patient's tolerance                         of previous anesthesia was reviewed.                        - The risks and benefits of the procedure and the                         sedation options and risks were discussed with the                         patient. All questions were answered and informed                         consent was obtained.                        - ASA Grade Assessment: III - A patient with severe                         systemic disease.                        After obtaining informed consent, the colonoscope was                         passed under direct vision. Throughout the procedure,                         the patient's blood pressure, pulse, and oxygen                         saturations were monitored continuously. The  Colonoscope was introduced through the anus and                         advanced to the the cecum, identified by the                         appendiceal orifice. The colonoscopy was performed                         with ease. The patient  tolerated the procedure well.                         The quality of the bowel preparation was good. Findings:      The perianal and digital rectal examinations were normal.      The entire examined colon appeared normal.      Multiple small and large-mouthed diverticula were found in the entire       colon. Impression:            - The entire examined colon is normal.                        - No specimens collected. Recommendation:        - Discharge patient to home (with escort).                        - Resume previous diet.                        - Continue present medications.                        - Repeat colonoscopy in 5 years for screening purposes. Procedure Code(s):     --- Professional ---                        [phone], Colonoscopy, flexible; diagnostic, including                         collection of specimen(s) by brushing or washing, when                         performed (separate procedure) Diagnosis Code(s):     --- Professional ---                        Z80.0, Family history of malignant neoplasm of                         digestive organs CPT copyright 2019 American Medical Association. All rights reserved. The codes documented in this report are preliminary and upon coder review may  be revised to meet current compliance requirements. Jonathon Bellows, MD Jonathon Bellows MD, MD 10/25/2020 8:41:35 AM This report has been signed electronically. Number of Addenda: 0 Note Initiated On: 10/25/2020 8:23 AM Scope Withdrawal Time: 0 hours 8 minutes 27 seconds  Total Procedure Duration: 0 hours 9 minutes 59 seconds  Estimated Blood Loss:  Estimated blood loss: none. Estimated blood loss: none.      [facility]

## 2020-10-26 ENCOUNTER — Encounter: Payer: Self-pay | Admitting: Gastroenterology

## 2020-11-03 NOTE — Progress Notes (Deleted)
Name: Madeline Dominguez   MRN: [id number]    DOB: 1965/06/11   Date:11/03/2020       Progress Note  Subjective  Chief Complaint  Follow Up  HPI  Cardiomyopathy: denies orthopnea but has noticed  decrease in exercise tolerance and some lower extremity edema with activity . She takes Exforge daily.  Only symptom is SOB with moderate activity , denies orthopnea or wheezing, occasionally has lower extremity edema   HTN: bp today is at goal, no chest pain or palpitation  used to see nephrologist She takes Exforge . She has noticed episodes of angioedema ( after she eats tomatoes and used to have that as a child) , she stopped ACE and tomatoes and no angioedema since Dec 2020 She denies chest pain or palpitation    Depression: severe episode in 2018 with anhedonia, lack of appetite, she had pain all over, unable to work for months. She was started on Zoloft she also had some therapy and is doing better.Husband now also unemployed due to company wanting him to relocate to [place] - he is a Administrator, she is also looking for a job   Adrenal nodule left/Goiter : no symptoms, many tests done, last visit was at [facility] 09/2019 per note  it was considered non functional adenoma. She also has a goiter and TSH was at goal, she is taking levothyroxine 50 mcg half daily, reminded her to go back for regular follow up   Obesity: discussed life style modification again, weight is stable. She asked about medication but explained Medicaid does not cover weight loss medicatiton, daughter is getting ready to have bariatric surgery, explained she is also a good candidate for surgery    GERD: she states off medications and no longer having problems    OA: taking meloxicam daily, she daily pain aching pain on both knees, she states stiffness is very bothersome, usually worse when she first starts to move No effusion , redness or increase in warmth. Discussed risk of long term nsaid's therapy , advised to try  alternating with Tylenol 4 times daily    Anemia: resolved, last labs reviewed with patient. 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[place]) 07/10/2016   History of radioactive iodine thyroid ablation    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   Iron deficiency anemia 07/27/2015   B12 deficiency 07/27/2015   History of pulmonary embolus (PE) 07/25/2015   Adrenal nodule ([place]) 07/25/2015   Pulmonary nodules/lesions, multiple 07/25/2015   OSA (obstructive sleep apnea) 05/18/2015   Thyroid nodule 12/17/2014   Gastroesophageal reflux disease without esophagitis 09/02/2014   Morbid obesity, unspecified obesity type ([place]) 06/22/2014   Large breasts 06/22/2014   Anemia, iron deficiency 06/22/2014   H/O hyperthyroidism 06/22/2014   Right ventricular dilation 06/22/2014   Goiter, toxic, multinodular 05/22/2013   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    Past Surgical History:  Procedure Laterality Date   COLONOSCOPY WITH PROPOFOL N/A 10/25/2020   Procedure: COLONOSCOPY WITH PROPOFOL;  Surgeon: Jonathon Bellows,  MD;  Location: [facility] ENDOSCOPY;  Service: Gastroenterology;  Laterality: N/A;   LAPAROTOMY     for after childbirth    Family History  Problem Relation Age of Onset   Hypertension Mother    CVA Father    Deep vein thrombosis Brother    Pulmonary embolism Brother    Anemia Sister     Social History   Tobacco Use   Smoking status: Never   Smokeless tobacco: Never  Substance  Use Topics   Alcohol use: No    Alcohol/week: 0.0 standard drinks    Comment: rarely     Current Outpatient Medications:    amLODipine-valsartan (EXFORGE) 5-160 MG tablet, Take 1 tablet by mouth daily., Disp: 90 tablet, Rfl: 1   amoxicillin (AMOXIL) 500 MG capsule, Take 500 mg by mouth 3 (three) times daily., Disp: , Rfl:    baclofen (LIORESAL) 10 MG tablet, Take 1 tablet (10 mg total) by mouth 3 (three) times daily as needed for muscle spasms., Disp: 30 tablet, Rfl: 0   Cholecalciferol (VITAMIN D) 50 MCG (2000 UT) CAPS, Take 1 capsule (2,000 Units total) by mouth daily., Disp: 30 capsule, Rfl: 0   EUTHYROX 50 MCG tablet, Take 50 mcg by mouth daily., Disp: , Rfl:    meloxicam (MOBIC) 15 MG tablet, Take 1 tablet (15 mg total) by mouth daily., Disp: 90 tablet, Rfl: 1   sertraline (ZOLOFT) 100 MG tablet, 125 mg daily. Take along with 25 mg., Disp: 30 tablet, Rfl: 1   sertraline (ZOLOFT) 25 MG tablet, 125 mg daily. Take along with 100 mg tab, Disp: 30 tablet, Rfl: 2   vitamin B-12 1000 MCG tablet, Take 1 tablet (1,000 mcg total) by mouth daily., Disp: 30 tablet, Rfl: 0  Allergies  Allergen Reactions   Aspirin Nausea And Vomiting   Hctz [Hydrochlorothiazide] Other (See Comments)    Nausea, cramping   Tomato Itching and Swelling    I personally reviewed active problem list, medication list, allergies, family history, social history, health maintenance with the patient/caregiver today.   ROS  ***  Objective  There were no vitals filed for this visit.  There is no height or weight on file to calculate BMI.  Physical Exam ***  Recent Results (from the past 2160 hour(s))  Cytology - PAP     Status: None   Collection Time: 09/29/20 10:08 AM  Result Value Ref Range   High risk HPV Negative    Adequacy      Satisfactory for evaluation; transformation zone component ABSENT.   Diagnosis      - Negative for intraepithelial lesion or malignancy (NILM)   Comment Normal Reference  Range HPV - Negative     PHQ2/9: Depression screen [facility] 2/9 09/29/2020 05/04/2020 01/21/2020 07/14/2019 05/12/2019  Decreased Interest 3 2 1 1 1   Down, Depressed, Hopeless 3 1 1 1  0  PHQ - 2 Score 6 3 2 2 1   Altered sleeping 2 1 2 2  0  Tired, decreased energy 2 1 2 1  0  Change in appetite 0 1 2 2  0  Feeling bad or failure about yourself  0 0 0 0 0  Trouble concentrating 1 0 2 1 0  Moving slowly or fidgety/restless 0 0 0 1 0  Suicidal thoughts 0 0 0 0 0  PHQ-9 Score 11 6 10 9 1   Difficult doing work/chores - - Not difficult at all Somewhat difficult Not difficult at all  Some encounter information is confidential and restricted. Go to  Review Flowsheets activity to see all data.  Some recent data might be hidden    phq 9 is {gen pos [id number]}   Fall Risk: Fall Risk  09/29/2020 05/04/2020 01/21/2020 07/14/2019 05/12/2019  Falls in the past year? 0 0 0 0 0  Number falls in past yr: 0 0 0 0 0  Injury with Fall? 0 0 0 0 0  Risk for fall due to : No Fall Risks - - - -  Follow up Falls prevention discussed - - - Falls evaluation completed      Functional Status Survey:      Assessment & Plan  *** There are no diagnoses linked to this encounter.

## 2020-11-04 ENCOUNTER — Ambulatory Visit: Payer: Medicaid Other | Admitting: Family Medicine

## 2020-11-17 ENCOUNTER — Ambulatory Visit: Payer: Medicaid Other | Admitting: Family Medicine

## 2020-11-17 NOTE — Progress Notes (Deleted)
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[place]) 07/10/2016   History of radioactive iodine thyroid ablation    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   Iron deficiency anemia 07/27/2015   B12 deficiency 07/27/2015   History of pulmonary embolus (PE) 07/25/2015   Adrenal nodule ([place]) 07/25/2015   Pulmonary nodules/lesions, multiple 07/25/2015   OSA (obstructive sleep apnea) 05/18/2015   Thyroid nodule 12/17/2014   Gastroesophageal reflux disease without esophagitis 09/02/2014   Morbid obesity, unspecified obesity type ([place]) 06/22/2014   Large breasts 06/22/2014   Anemia, iron deficiency 06/22/2014   H/O hyperthyroidism 06/22/2014   Right ventricular dilation 06/22/2014   Goiter, toxic, multinodular 05/22/2013   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    Past Surgical History:  Procedure Laterality Date   COLONOSCOPY WITH PROPOFOL N/A 10/25/2020   Procedure: COLONOSCOPY WITH PROPOFOL;  Surgeon: Jonathon Bellows, MD;  Location: [facility] ENDOSCOPY;  Service: Gastroenterology;  Laterality: N/A;   LAPAROTOMY     for after childbirth    Family History  Problem Relation Age of Onset   Hypertension Mother    CVA Father    Deep vein thrombosis Brother    Pulmonary embolism Brother    Anemia Sister     Social History   Tobacco Use   Smoking status: Never   Smokeless tobacco: Never  Substance Use Topics    Alcohol use: No    Alcohol/week: 0.0 standard drinks    Comment: rarely     Current Outpatient Medications:    amLODipine-valsartan (EXFORGE) 5-160 MG tablet, Take 1 tablet by mouth daily., Disp: 90 tablet, Rfl: 1   amoxicillin (AMOXIL) 500 MG capsule, Take 500 mg by mouth 3 (three) times daily., Disp: , Rfl:    baclofen (LIORESAL) 10 MG tablet, Take 1 tablet (10 mg total) by mouth 3 (three) times daily as needed for muscle spasms., Disp: 30 tablet, Rfl: 0   Cholecalciferol (VITAMIN D) 50 MCG (2000 UT) CAPS, Take 1 capsule (2,000 Units total) by mouth daily., Disp: 30 capsule, Rfl: 0   EUTHYROX 50 MCG tablet, Take 50 mcg by mouth daily., Disp: , Rfl:    meloxicam (MOBIC) 15 MG tablet, Take 1 tablet (15 mg total) by mouth daily., Disp: 90 tablet, Rfl: 1   sertraline (ZOLOFT) 100 MG tablet, 125 mg daily. Take along with 25 mg., Disp: 30 tablet, Rfl: 1   sertraline (ZOLOFT) 25 MG tablet, 125 mg daily. Take along with 100 mg tab, Disp: 30 tablet, Rfl: 2   vitamin B-12 1000 MCG tablet, Take 1 tablet (1,000 mcg total) by mouth daily., Disp: 30 tablet, Rfl: 0  Allergies  Allergen Reactions   Aspirin Nausea And Vomiting   Hctz [Hydrochlorothiazide] Other (See Comments)    Nausea, cramping   Tomato Itching and Swelling    I personally reviewed active problem list, medication list, allergies, family history, social history, health maintenance with the patient/caregiver today.  ROS  ***  Objective  There were no vitals filed for this visit.  There is no height or weight on file to calculate BMI.  Physical Exam ***  Recent Results (from the past 2160 hour(s))  Cytology - PAP     Status: None   Collection Time: 09/29/20 10:08 AM  Result Value Ref Range   High risk HPV Negative    Adequacy      Satisfactory for evaluation; transformation zone component ABSENT.   Diagnosis      - Negative for intraepithelial lesion or malignancy (NILM)   Comment Normal Reference Range HPV -  Negative      PHQ2/9: Depression screen [facility] 2/9 09/29/2020 05/04/2020 01/21/2020 07/14/2019 05/12/2019  Decreased Interest 3 2 1 1 1   Down, Depressed, Hopeless 3 1 1 1  0  PHQ - 2 Score 6 3 2 2 1   Altered sleeping 2 1 2 2  0  Tired, decreased energy 2 1 2 1  0  Change in appetite 0 1 2 2  0  Feeling bad or failure about yourself  0 0 0 0 0  Trouble concentrating 1 0 2 1 0  Moving slowly or fidgety/restless 0 0 0 1 0  Suicidal thoughts 0 0 0 0 0  PHQ-9 Score 11 6 10 9 1   Difficult doing work/chores - - Not difficult at all Somewhat difficult Not difficult at all  Some encounter information is confidential and restricted. Go to Review Flowsheets activity to see all data.  Some recent data might be hidden    phq 9 is {gen pos [id number]}   Fall Risk: Fall Risk  09/29/2020 05/04/2020 01/21/2020 07/14/2019 05/12/2019  Falls in the past year? 0 0 0 0 0  Number falls in past yr: 0 0 0 0 0  Injury with Fall? 0 0 0 0 0  Risk for fall due to : No Fall Risks - - - -  Follow up Falls prevention discussed - - - Falls evaluation completed      Functional Status Survey:      Assessment & Plan  *** There are no diagnoses linked to this encounter.

## 2020-11-19 ENCOUNTER — Other Ambulatory Visit: Payer: Self-pay | Admitting: Family Medicine

## 2020-11-19 DIAGNOSIS — M170 Bilateral primary osteoarthritis of knee: Secondary | ICD-10-CM

## 2020-11-19 NOTE — Telephone Encounter (Signed)
Requested Prescriptions  Pending Prescriptions Disp Refills  . meloxicam (MOBIC) 15 MG tablet [Pharmacy Med Name: Meloxicam 15 MG Oral Tablet] 90 tablet 0    Sig: Take 1 tablet by mouth once daily     Analgesics:  COX2 Inhibitors Passed - 11/19/2020  8:38 AM      Passed - HGB in normal range and within 360 days    Hemoglobin  Date Value Ref Range Status  01/26/2020 12.0 11.7 - 15.5 g/dL Final  12/03/2014 10.1 (L) 11.1 - 15.9 g/dL Final         Passed - Cr in normal range and within 360 days    Creat  Date Value Ref Range Status  01/26/2020 0.73 0.50 - 1.05 mg/dL Final    Comment:    For patients >67 years of age, the reference limit for Creatinine is approximately 13% higher for people identified as African-American. Renella Cunas - Patient is not pregnant      Passed - Valid encounter within last 12 months    Recent Outpatient Visits          1 month ago Well adult exam   [facility] Steele Sizer, MD   6 months ago Mild major depression [facility])   [facility] Steele Sizer, MD   10 months ago Adrenal nodule [facility])   [facility] Steele Sizer, MD   1 year ago Mild major depression [facility])   [facility] Delsa Grana, PA-C   1 year ago Hypertension, benign   [facility] Steele Sizer, MD

## 2020-11-19 NOTE — Progress Notes (Deleted)
[place] MD/PA/NP OP Progress Note  11/19/2020 5:24 AM Madeline Dominguez  MRN:  [id number]  Chief Complaint:  HPI: *** Visit Diagnosis: No diagnosis found.  Past Psychiatric History: Please see initial evaluation for full details. I have reviewed the history. No updates at this time.     Past Medical History:  Past Medical History:  Diagnosis Date   Adrenal adenoma    a. normal cortisol and aldosterone levels   Cough 07/25/2015   Dilated cardiomyopathy ([place])    a. TTE 2/15: EF of 55-60%, dilated LV & LA, and nl RV; b. TTE 6/16:EF 55-60%, no RWMA, nl LV dias fxn, LV cavity size nl with mild concentric LVH, mildly dilated ascending aorta, LA nl size, RV sys fxn nl, PASP nl; c. TTE 7/17: EF 45-50%, unable to exclude RWMA, nl LV dias faxn, mildly dilated RV w/ nl sys fxn, PASP nl; d. TTE 7/18: EF 45-50%, mild LVH, Gr1DD, mildly dilated LA, mildly dilated RV   Goiter    a. s/p radioactive iodine treatment in 2010   Heart palpitations    Hypertension    Iron deficiency    Large breasts    Morbid obesity ([place])    Postpartum hemorrhage    Pulmonary embolism ([place]) 07/2015   a. s/p Eliquis; b. hypercoag work up neg; c. felt to be 2/2 OCP    Past Surgical History:  Procedure Laterality Date   COLONOSCOPY WITH PROPOFOL N/A 10/25/2020   Procedure: COLONOSCOPY WITH PROPOFOL;  Surgeon: Jonathon Bellows, MD;  Location: [facility] END[facility]COPY;  Service: Gastroenterology;  Laterality: N/A;   LAPAROTOMY     for after childbirth    Family Psychiatric History: Please see initial evaluation for full details. I have reviewed the history. No updates at this time.     Family History:  Family History  Problem Relation Age of Onset   Hypertension Mother    CVA Father    Deep vein thrombosis Brother    Pulmonary embolism Brother    Anemia Sister     Social History:  Social History   Socioeconomic History   Marital status: Married    Spouse name: Not on file   Number of children: 3   Years of education:  Not on file   Highest education level: High school graduate  Occupational History   Not on file  Tobacco Use   Smoking status: Never   Smokeless tobacco: Never  Vaping Use   Vaping Use: Never used  Substance and Sexual Activity   Alcohol use: No    Alcohol/week: 0.0 standard drinks    Comment: rarely   Drug use: No   Sexual activity: Not Currently    Partners: Male    Birth control/protection: Condom  Other Topics Concern   Not on file  Social History Narrative   Lives with 3 with 3 daughters, husband was incarcerated April 2018 - for stealing, she has not been keeping in touch with him because she is disappointed. 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    Social Determinants of Health   Financial Resource Strain: High Risk   Difficulty of Paying Living Expenses: Hard  Food Insecurity: Food Insecurity Present   Worried About Charity fundraiser in the Last Year: Sometimes true   Ran Out of Food in the Last Year: Sometimes true  Transportation Needs: No Data processing manager (Medical): No   Lack of Transportation (Non-Medical): No  Physical Activity: Insufficiently Active   Days of Exercise per Week: 2 days   Minutes of Exercise per Session: 20 min  Stress: Stress Concern Present   Feeling of Stress : Very much  Social Connections: Socially Integrated   Frequency of Communication with Friends and Family: More than three times a week   Frequency of Social Gatherings with Friends and Family: Three times a week   Attends Religious Services: More than 4 times per year   Active Member of Clubs or Organizations: Yes   Attends Archivist Meetings: 1 to 4 times per year   Marital Status: Married    Allergies:  Allergies  Allergen Reactions   Aspirin Nausea And Vomiting   Hctz [Hydrochlorothiazide] Other (See  Comments)    Nausea, cramping   Tomato Itching and Swelling    Metabolic Disorder Labs: Lab Results  Component Value Date   HGBA1C 5.2 01/26/2020   MPG 103 01/26/2020   MPG 103 05/12/2019   No results found for: PROLACTIN Lab Results  Component Value Date   CHOL 207 (H) 01/26/2020   TRIG 55 01/26/2020   HDL 69 01/26/2020   CHOLHDL 3.0 01/26/2020   VLDL 11 04/27/2016   LDLCALC 123 (H) 01/26/2020   LDLCALC 109 (H) 05/12/2019   Lab Results  Component Value Date   TSH 4.72 (H) 08/24/2019   TSH 7.71 (H) 05/12/2019    Therapeutic Level Labs: No results found for: LITHIUM No results found for: VALPROATE No components found for:  CBMZ  Current Medications: Current Outpatient Medications  Medication Sig Dispense Refill   amLODipine-valsartan (EXFORGE) 5-160 MG tablet Take 1 tablet by mouth daily. 90 tablet 1   amoxicillin (AMOXIL) 500 MG capsule Take 500 mg by mouth 3 (three) times daily.     baclofen (LIORESAL) 10 MG tablet Take 1 tablet (10 mg total) by mouth 3 (three) times daily as needed for muscle spasms. 30 tablet 0   Cholecalciferol (VITAMIN D) 50 MCG (2000 UT) CAPS Take 1 capsule (2,000 Units total) by mouth daily. 30 capsule 0   EUTHYROX 50 MCG tablet Take 50 mcg by mouth daily.     meloxicam (MOBIC) 15 MG tablet Take 1 tablet (15 mg total) by mouth daily. 90 tablet 1   sertraline (ZOLOFT) 100 MG tablet 125 mg daily. Take along with 25 mg. 30 tablet 1   sertraline (ZOLOFT) 25 MG tablet 125 mg daily. Take along with 100 mg tab 30 tablet 2   vitamin B-12 1000 MCG tablet Take 1 tablet (1,000 mcg total) by mouth daily. 30 tablet 0   No current facility-administered medications for this visit.     Musculoskeletal: Strength & Muscle Tone:  N/A Gait & Station:  N/A Patient leans: N/A  Psychiatric Specialty Exam: Review of Systems  There were no vitals taken for this visit.There is no height or weight on file to calculate BMI.  General Appearance:  {Appearance:22683}  Eye Contact:  {BHH EYE CONTACT:22684}  Speech:  Clear and Coherent  Volume:  Normal  Mood:  {BHH MOOD:22306}  Affect:  {Affect (PAA):22687}  Thought Process:  Coherent  Orientation:  Full (Time, Place, and Person)  Thought Content: Logical   Suicidal Thoughts:  {ST/HT (PAA):22692}  Homicidal Thoughts:  {ST/HT (PAA):22692}  Memory:  Immediate;   Good  Judgement:  {Judgement (PAA):22694}  Insight:  {Insight (PAA):22695}  Psychomotor Activity:  Normal  Concentration:  Concentration: Good and Attention Span: Good  Recall:  Good  Fund of Knowledge: Good  Language: Good  Akathisia:  No  Handed:  Right  AIMS (if indicated): not done  Assets:  Communication Skills Desire for Improvement  ADL's:  Intact  Cognition: WNL  Sleep:  {BHH GOOD/FAIR/POOR:22877}   Screenings: GAD-7    Flowsheet Row Office Visit from 01/21/2020 in [facility] Office Visit from 07/22/2017 in [facility]  Total GAD-7 Score 3 3      PHQ2-9    [address] Office Visit from 09/29/2020 in [facility] Video Visit from 08/29/2020 in [place] Video Visit from 05/31/2020 in [place] Visit from 05/04/2020 in [facility] Video Visit from 03/01/2020 in [place]  PHQ-2 Total Score 6 3 2 3 2   PHQ-9 Total Score 11 8 6 6 8       Flowsheet Row Admission (Discharged) from 10/25/2020 in [facility] Video Visit from 03/01/2020 in [place]  C-SSRS RISK CATEGORY No Risk No Risk        Assessment and Plan:  MARNETTE PERKINS is a 55 y.o. year old female with a history of depression, hypertension, dilated cardiomyopathy, adrenal adenoma (undergoing evaluation), bilateral pulmonary embolism, obesity, obstructive sleep apnea on CPAP, history of toxic multinodular goiter, who  presents for follow up appointment for below.     1. Mild episode of recurrent major depressive disorder ([place]) Although she reports slight worsening in depressive symptoms in the context of being laid off from work and recent incident of her husband, she has been handling things relatively well.  Other psychosocial stressors includes her mother-in-law, who recently had removal of bladder, and financial strain.  She is not interested in medication adjustment at this time.  We will continue current dose of sertraline to target depression.    Plan 1. Continue sertraline 125 mg at night 2. Next appointment: 11/22 at 1:40 for 20 mins, video       The patient demonstrates the following risk factors for suicide: Chronic risk factors for suicide include: psychiatric disorder of depression. Acute risk factors for suicide include: unemployment and loss (financial, interpersonal, professional). Protective factors for this patient include: positive social support and hope for the future. Considering these factors, the overall suicide risk at this point appears to be low.       [name], MD 11/19/2020, 5:24 AM

## 2020-11-22 ENCOUNTER — Telehealth: Payer: Medicaid Other | Admitting: Psychiatry

## 2020-11-22 ENCOUNTER — Telehealth: Payer: Self-pay | Admitting: Psychiatry

## 2020-11-22 ENCOUNTER — Other Ambulatory Visit: Payer: Self-pay

## 2020-11-22 NOTE — Telephone Encounter (Signed)
Sent link for video visit through [place]. Patient did not sign in. Called the patient twice for appointment scheduled today. There is an automatic message stating that "call cannot be completed."

## 2020-12-01 ENCOUNTER — Other Ambulatory Visit: Payer: Self-pay | Admitting: Family Medicine

## 2020-12-01 DIAGNOSIS — I10 Essential (primary) hypertension: Secondary | ICD-10-CM

## 2020-12-04 ENCOUNTER — Other Ambulatory Visit: Payer: Self-pay | Admitting: Family Medicine

## 2020-12-04 DIAGNOSIS — I10 Essential (primary) hypertension: Secondary | ICD-10-CM

## 2020-12-05 NOTE — Telephone Encounter (Signed)
Appt made

## 2020-12-26 ENCOUNTER — Other Ambulatory Visit: Payer: Self-pay | Admitting: Psychiatry

## 2021-01-01 ENCOUNTER — Other Ambulatory Visit: Payer: Self-pay | Admitting: Family Medicine

## 2021-01-01 DIAGNOSIS — I10 Essential (primary) hypertension: Secondary | ICD-10-CM

## 2021-01-11 ENCOUNTER — Encounter: Payer: Self-pay | Admitting: Family Medicine

## 2021-01-11 ENCOUNTER — Telehealth (INDEPENDENT_AMBULATORY_CARE_PROVIDER_SITE_OTHER): Payer: Medicaid Other | Admitting: Family Medicine

## 2021-01-11 ENCOUNTER — Ambulatory Visit: Payer: Medicaid Other | Admitting: Family Medicine

## 2021-01-11 DIAGNOSIS — E039 Hypothyroidism, unspecified: Secondary | ICD-10-CM

## 2021-01-11 DIAGNOSIS — Z862 Personal history of diseases of the blood and blood-forming organs and certain disorders involving the immune mechanism: Secondary | ICD-10-CM | POA: Diagnosis not present

## 2021-01-11 DIAGNOSIS — K219 Gastro-esophageal reflux disease without esophagitis: Secondary | ICD-10-CM

## 2021-01-11 DIAGNOSIS — F320 Major depressive disorder, single episode, mild: Secondary | ICD-10-CM | POA: Diagnosis not present

## 2021-01-11 DIAGNOSIS — I10 Essential (primary) hypertension: Secondary | ICD-10-CM

## 2021-01-11 DIAGNOSIS — E538 Deficiency of other specified B group vitamins: Secondary | ICD-10-CM | POA: Diagnosis not present

## 2021-01-11 DIAGNOSIS — E278 Other specified disorders of adrenal gland: Secondary | ICD-10-CM | POA: Diagnosis not present

## 2021-01-11 DIAGNOSIS — M170 Bilateral primary osteoarthritis of knee: Secondary | ICD-10-CM

## 2021-01-11 DIAGNOSIS — I420 Dilated cardiomyopathy: Secondary | ICD-10-CM | POA: Diagnosis not present

## 2021-01-11 MED ORDER — AMLODIPINE BESYLATE-VALSARTAN 5-160 MG PO TABS: 1.0000 | ORAL_TABLET | Freq: Every day | ORAL | 1 refills | Status: DC

## 2021-01-11 NOTE — Progress Notes (Signed)
Name: Madeline Dominguez   MRN: [id number]    DOB: 1965-06-07   Date:01/11/2021       Progress Note  Subjective  Chief Complaint  Follow Up  I connected with  [name]  on 01/11/21 at 11:20 AM EST by a video enabled telemedicine application and verified that I am speaking with the correct person using two identifiers.  I discussed the limitations of evaluation and management by telemedicine and the availability of in person appointments. The patient expressed understanding and agreed to proceed with the virtual visit  Staff also discussed with the patient that there may be a patient responsible charge related to this service. Patient Location: working from home  Provider Location: [facility] Additional Individuals present: alone   HPI  Cardiomyopathy: she takes Exforge daily.  Only symptom is SOB with moderate activity , denies orthopnea or wheezing, occasionally has lower extremity edema. She has OSA but not compliant with CPAP, explained need to use it daily   The 10-year ASCVD risk score (Arnett DK, et al., 2019) is: 1.3%   Values used to calculate the score:     Age: 57 years     Sex: Female     Is Non-Hispanic African American: Yes     Diabetic: No     Tobacco smoker: No     Systolic Blood Pressure: 99 mmHg     Is BP treated: Yes     HDL Cholesterol: 69 mg/dL     Total Cholesterol: 207 mg/dL    HTN: bp today is at goal, no chest pain or palpitation  used to see nephrologist She takes Exforge . She has noticed episodes of angioedema ( after she eats tomatoes and used to have that as a child) , she stopped ACE and tomatoes and no angioedema since Dec 2020 . She checks bp at home and has been controlled around 130/80's    Depression: severe episode in 2018 with anhedonia, lack of appetite, she had pain all over, unable to work for months. She is still seeing psychiatrist and taking zoloft, she has a job now and seems to be feeling better  Adrenal nodule left/Goiter : no  symptoms, many tests done, she is going to [facility]  last visit 09/ it was considered non functional adenoma. She also has a goiter  she is taking levothyroxine 50 mcg half daily, last visit was Dec 2022 but I don't see labs    Morbid obesity: discussed life style modification again, weight is stable. She has co-morbidities, explaining losing weight will also decrease knee pain    GERD: she states off medications and no longer having problems    OA: taking meloxicam daily, she daily pain aching pain on both knees, she states stiffness is very bothersome, usually worse when she first starts to move No effusion , redness or increase in warmth. 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[place]) 07/10/2016  History of radioactive iodine thyroid ablation    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   Iron deficiency anemia 07/27/2015   B12 deficiency 07/27/2015   History of pulmonary embolus ([place]) 07/25/2015   Adrenal nodule ([place]) 07/25/2015   Pulmonary nodules/lesions, multiple 07/25/2015   OSA (obstructive sleep apnea) 05/18/2015   Thyroid nodule 12/17/2014   Gastroesophageal reflux disease without esophagitis 09/02/2014   Morbid obesity, unspecified obesity type ([place]) 06/22/2014   Large breasts 06/22/2014   H/O hyperthyroidism 06/22/2014   Right ventricular dilation 06/22/2014   Goiter, toxic, multinodular 05/22/2013    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    Past Surgical History:  Procedure Laterality Date   COLONOSCOPY WITH PROPOFOL N/A 10/25/2020   Procedure: COLONOSCOPY WITH PROPOFOL;  Surgeon: Jonathon Bellows, MD;  Location: [facility] ENDOSCOPY;  Service: Gastroenterology;  Laterality: N/A;   LAPAROTOMY     for after childbirth    Family History  Problem Relation Age of Onset   Hypertension Mother    CVA Father    Deep vein thrombosis Brother    Pulmonary embolism Brother    Anemia Sister     Social History   Socioeconomic History   Marital status: Married    Spouse name: Not on file   Number of children: 3   Years of education: Not on file   Highest education level: High school graduate  Occupational History   Not on file  Tobacco Use   Smoking status: Never   Smokeless tobacco: Never  Vaping Use   Vaping Use: Never used  Substance and Sexual Activity   Alcohol use: No    Alcohol/week: 0.0 standard drinks    Comment: rarely   Drug use: No   Sexual activity: Not Currently    Partners: Male    Birth control/protection: Condom  Other Topics Concern   Not on file  Social History Narrative   Lives with 3 with 3 daughters, husband was incarcerated April 2018 - for stealing, she has not been keeping in touch with him because she is disappointed. 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    Social Determinants of Health   Financial Resource Strain: High Risk   Difficulty of Paying Living Expenses: Hard  Food Insecurity: Food Insecurity Present   Worried About Charity fundraiser in the Last Year: Sometimes true   Arboriculturist in the Last Year: Sometimes true  Transportation Needs: No Transportation Needs   Lack of Transportation (Medical): No   Lack of Transportation (Non-Medical): No  Physical Activity: Insufficiently Active   Days of  Exercise per Week: 2 days   Minutes of Exercise per Session: 20 min  Stress: Stress Concern Present   Feeling of Stress : Very much  Social Connections: Socially Integrated   Frequency of Communication with Friends and Family: More than three times a week   Frequency of Social Gatherings with Friends and Family: Three times a week   Attends Religious Services: More than 4 times per year   Active Member of Clubs or Organizations: Yes   Attends Archivist Meetings: 1 to 4 times per year   Marital Status: Married  Human resources officer Violence: Not At Risk   Fear of Current or Ex-Partner: No   Emotionally Abused: No   Physically Abused: No   Sexually Abused:  No     Current Outpatient Medications:    levothyroxine (SYNTHROID) 50 MCG tablet, Take 1 tablet by mouth daily., Disp: , Rfl:    amLODipine-valsartan (EXFORGE) 5-160 MG tablet, Take 1 tablet by mouth once daily, Disp: 30 tablet, Rfl: 0   baclofen (LIORESAL) 10 MG tablet, Take 1 tablet (10 mg total) by mouth 3 (three) times daily as needed for muscle spasms., Disp: 30 tablet, Rfl: 0   Cholecalciferol (VITAMIN D) 50 MCG (2000 UT) CAPS, Take 1 capsule (2,000 Units total) by mouth daily., Disp: 30 capsule, Rfl: 0   meloxicam (MOBIC) 15 MG tablet, Take 1 tablet by mouth once daily, Disp: 90 tablet, Rfl: 0   sertraline (ZOLOFT) 100 MG tablet, TAKE 1 TABLET BY MOUTH ONCE DAILY. TAKE WITH 25 MG FOR A TOTAL OF 125MG  PER DAY, Disp: 30 tablet, Rfl: 0   sertraline (ZOLOFT) 25 MG tablet, 125 mg daily. Take along with 100 mg tab, Disp: 30 tablet, Rfl: 2   vitamin B-12 1000 MCG tablet, Take 1 tablet (1,000 mcg total) by mouth daily., Disp: 30 tablet, Rfl: 0  Allergies  Allergen Reactions   Aspirin Nausea And Vomiting   Hctz [Hydrochlorothiazide] Other (See Comments)    Nausea, cramping   Tomato Itching and Swelling    I personally reviewed active problem list, medication list, allergies, family history, social history, health  maintenance with the patient/caregiver today.   ROS  Ten systems reviewed and is negative except as mentioned in HPI   Objective  Virtual encounter, vitals not obtained.  There is no height or weight on file to calculate BMI.  Physical Exam  Awake, alert and oriented   PHQ2/9: Depression screen [facility] 2/9 01/11/2021 09/29/2020 05/04/2020 01/21/2020 07/14/2019  Decreased Interest 0 3 2 1 1   Down, Depressed, Hopeless 0 3 1 1 1   PHQ - 2 Score 0 6 3 2 2   Altered sleeping 3 2 1 2 2   Tired, decreased energy 1 2 1 2 1   Change in appetite 1 0 1 2 2   Feeling bad or failure about yourself  1 0 0 0 0  Trouble concentrating 0 1 0 2 1  Moving slowly or fidgety/restless 0 0 0 0 1  Suicidal thoughts 0 0 0 0 0  PHQ-9 Score 6 11 6 10 9   Difficult doing work/chores Somewhat difficult - - Not difficult at all Somewhat difficult  Some encounter information is confidential and restricted. Go to Review Flowsheets activity to see all data.  Some recent data might be hidden   PHQ-2/9 Result is positive.    Fall Risk: Fall Risk  09/29/2020 05/04/2020 01/21/2020 07/14/2019 05/12/2019  Falls in the past year? 0 0 0 0 0  Number falls in past yr: 0 0 0 0 0  Injury with Fall? 0 0 0 0 0  Risk for fall due to : No Fall Risks - - - -  Follow up Falls prevention discussed - - - Falls evaluation completed     Assessment & Plan  1. Adrenal nodule ([place])  Keep follow up with Endo   2. Dilated cardiomyopathy (HCC)  Resume CPAP machine every night   3. Mild major depression [facility])  Call psychiatrist for follow up  4. Primary osteoarthritis of both knees  Cut down on Meloxicam   5. Morbid obesity ([place])  Discussed with the patient the risk posed by an increased BMI. Discussed importance of portion control, calorie counting and at least 150 minutes of physical activity weekly.  Avoid sweet beverages and drink more water. Eat at least 6 servings of fruit and vegetables daily    6. Hypothyroidism,  adult  Keep follow up with Endo   7. Gastroesophageal reflux disease without esophagitis   8. Hypertension, benign   9. B12 deficiency   10. History of iron deficiency anemia   I discussed the assessment and treatment plan with the patient. The patient was provided an opportunity to ask questions and all were answered. The patient agreed with the plan and demonstrated an understanding of the instructions.  The patient was advised to call back or seek an in-person evaluation if the symptoms worsen or if the condition fails to improve as anticipated.  I provided 25  minutes of non-face-to-face time during this encounter.

## 2021-01-25 ENCOUNTER — Other Ambulatory Visit: Payer: Self-pay | Admitting: Psychiatry

## 2021-01-26 NOTE — Telephone Encounter (Signed)
Dr.Hisada's patient

## 2021-01-30 ENCOUNTER — Other Ambulatory Visit: Payer: Self-pay | Admitting: Psychiatry

## 2021-01-30 NOTE — Telephone Encounter (Signed)
Ordered refill of sertraline per request from the pharmacy. Please contact the patient and have a follow up appointment. Will not be able to do any more refills without evaluation.

## 2021-01-30 NOTE — Telephone Encounter (Signed)
Dr. Hisada's pt 

## 2021-02-03 ENCOUNTER — Other Ambulatory Visit: Payer: Self-pay | Admitting: Internal Medicine

## 2021-02-03 ENCOUNTER — Other Ambulatory Visit: Payer: Self-pay | Admitting: Psychiatry

## 2021-02-03 ENCOUNTER — Other Ambulatory Visit: Payer: Self-pay | Admitting: Family Medicine

## 2021-02-03 DIAGNOSIS — I10 Essential (primary) hypertension: Secondary | ICD-10-CM

## 2021-02-03 NOTE — Telephone Encounter (Signed)
Medication Refill - Medication: levothyroxine (SYNTHROID) 50 MCG tablet  sertraline (ZOLOFT) 25 MG tablet  Pt has been out of her supply for some time  Has the patient contacted their pharmacy? Yes.   (Agent: If no, request that the patient contact the pharmacy for the refill. If patient does not wish to contact the pharmacy document the reason why and proceed with request.) (Agent: If yes, when and what did the pharmacy advise?)  Preferred Pharmacy (with phone number or street name):  [facility] [place] [address]  [place]  [id number]  Phone: [phone] Fax: [phone]   Has the patient been seen for an appointment in the last year OR does the patient have an upcoming appointment? Yes.    Agent: Please be advised that RX refills may take up to 3 business days. We ask that you follow-up with your pharmacy.

## 2021-02-03 NOTE — Telephone Encounter (Signed)
Requested medication (s) are due for refill today: yes  Requested medication (s) are on the active medication list: yes  Last refill:  levothyroxine 12/22/20, Zoloft 25 08/29/20 #30/2  Future visit scheduled: no  Notes to clinic:  please advise for refill, levothyroxine is historical provider and zoloft is not delegated     Requested Prescriptions  Pending Prescriptions Disp Refills   levothyroxine (SYNTHROID) 50 MCG tablet      Sig: Take 1 tablet (50 mcg total) by mouth daily.     Endocrinology:  Hypothyroid Agents Failed - 02/03/2021  1:57 PM      Failed - TSH in normal range and within 360 days    TSH  Date Value Ref Range Status  08/24/2019 4.72 (H) mIU/L Final    Comment:              Reference Range .           > or = 20 Years  0.40-4.50 .                Pregnancy Ranges           First trimester    0.26-2.66           Second trimester   0.55-2.73           Third trimester    0.43-2.91           Passed - Valid encounter within last 12 months    Recent Outpatient Visits           3 weeks ago Adrenal nodule [facility])   [facility] Steele Sizer, MD   4 months ago Well adult exam   [facility] Steele Sizer, MD   9 months ago Mild major depression [facility])   [facility] Steele Sizer, MD   1 year ago Adrenal nodule [facility])   [facility] Steele Sizer, MD   1 year ago Mild major depression [facility])   [facility] Delsa Grana, PA-C               sertraline (ZOLOFT) 25 MG tablet 30 tablet 2    Sig: 125 mg daily. Take along with 100 mg tab     Not Delegated - Psychiatry:  Antidepressants - SSRI - sertraline Failed - 02/03/2021  1:57 PM      Failed - This refill cannot be delegated      Failed - AST in normal range and within 360 days    AST  Date Value Ref Range Status  01/26/2020 11 10 - 35 U/L Final          Failed - ALT in normal range and within 360 days     ALT  Date Value Ref Range Status  01/26/2020 11 6 - 29 U/L Final          Passed - Completed PHQ-2 or PHQ-9 in the last 360 days      Passed - Valid encounter within last 6 months    Recent Outpatient Visits           3 weeks ago Adrenal nodule [facility])   [facility] Steele Sizer, MD   4 months ago Well adult exam   [facility] Steele Sizer, MD   9 months ago Mild major depression [facility])   [facility] Steele Sizer, MD   1 year ago Adrenal nodule ([place])   [place]  [facility] Steele Sizer, MD   1 year ago Mild major depression [facility])   [facility] Delsa Grana, [place]

## 2021-02-03 NOTE — Telephone Encounter (Signed)
Requested medication (s) are due for refill today: no  Requested medication (s) are on the active medication list: yes  Last refill:  01/11/21 #90/1  Future visit scheduled: no  Notes to clinic:  Unable to refill per protocol due to failed labs, no updated results.      Requested Prescriptions  Pending Prescriptions Disp Refills   amLODipine-valsartan (EXFORGE) 5-160 MG tablet [Pharmacy Med Name: amLODIPine Besylate-Valsartan 5-160 MG Oral Tablet] 30 tablet 0    Sig: Take 1 tablet by mouth once daily     Cardiovascular: CCB + ARB Combos Failed - 02/03/2021  3:24 PM      Failed - K in normal range and within 180 days    Potassium  Date Value Ref Range Status  01/26/2020 3.9 3.5 - 5.3 mmol/L Final          Failed - Cr in normal range and within 180 days    Creat  Date Value Ref Range Status  01/26/2020 0.73 0.50 - 1.05 mg/dL Final    Comment:    For patients >33 years of age, the reference limit for Creatinine is approximately 13% higher for people identified as African-American. .           Failed - Na in normal range and within 180 days    Sodium  Date Value Ref Range Status  01/26/2020 142 135 - 146 mmol/L Final  12/03/2014 144 136 - 144 mmol/L Final    Comment:    **Effective December 13, 2014 the reference interval**   for Sodium, Serum will be changing to:                                             134 - 144           Passed - Patient is not pregnant      Passed - Last BP in normal range    BP Readings from Last 1 Encounters:  10/25/20 (!) 99/57          Passed - Valid encounter within last 6 months    Recent Outpatient Visits           3 weeks ago Adrenal nodule [facility])   [facility] Steele Sizer, MD   4 months ago Well adult exam   [facility] Steele Sizer, MD   9 months ago Mild major depression [facility])   [facility] Steele Sizer, MD   1 year ago Adrenal nodule [facility])   [facility] Steele Sizer, MD   1 year ago Mild major depression [facility])   [facility] Delsa Grana, [place]

## 2021-02-06 ENCOUNTER — Other Ambulatory Visit: Payer: Self-pay | Admitting: Family Medicine

## 2021-02-06 ENCOUNTER — Other Ambulatory Visit: Payer: Self-pay

## 2021-02-06 DIAGNOSIS — E538 Deficiency of other specified B group vitamins: Secondary | ICD-10-CM

## 2021-02-06 DIAGNOSIS — Z131 Encounter for screening for diabetes mellitus: Secondary | ICD-10-CM

## 2021-02-06 DIAGNOSIS — Z862 Personal history of diseases of the blood and blood-forming organs and certain disorders involving the immune mechanism: Secondary | ICD-10-CM

## 2021-02-06 DIAGNOSIS — E039 Hypothyroidism, unspecified: Secondary | ICD-10-CM

## 2021-02-06 DIAGNOSIS — E559 Vitamin D deficiency, unspecified: Secondary | ICD-10-CM

## 2021-02-06 DIAGNOSIS — I10 Essential (primary) hypertension: Secondary | ICD-10-CM

## 2021-02-06 DIAGNOSIS — Z79899 Other long term (current) drug therapy: Secondary | ICD-10-CM

## 2021-02-06 DIAGNOSIS — Z13220 Encounter for screening for lipoid disorders: Secondary | ICD-10-CM

## 2021-02-06 MED ORDER — LEVOTHYROXINE SODIUM 50 MCG PO TABS: 50.0000 ug | ORAL_TABLET | Freq: Every day | ORAL | 0 refills | Status: DC

## 2021-02-06 MED ORDER — SERTRALINE HCL 25 MG PO TABS: ORAL_TABLET | ORAL | 0 refills | Status: DC

## 2021-03-04 ENCOUNTER — Other Ambulatory Visit: Payer: Self-pay | Admitting: Psychiatry

## 2021-03-09 ENCOUNTER — Other Ambulatory Visit: Payer: Self-pay | Admitting: Family Medicine

## 2021-03-09 ENCOUNTER — Other Ambulatory Visit: Payer: Self-pay | Admitting: Psychiatry

## 2021-03-10 ENCOUNTER — Other Ambulatory Visit: Payer: Self-pay

## 2021-03-10 NOTE — Telephone Encounter (Signed)
Requested medication (s) are due for refill today:   Provider to review ? ?Requested medication (s) are on the active medication list:   Yes to be taken with 100 mg of Zoloft ? ?Future visit scheduled:   No ? ? ?Last ordered: 02/06/2021 #90, 0 refills ? ?Returned because this is a non delegated refill    Duplicate request.  ? ?Requested Prescriptions  ?Pending Prescriptions Disp Refills  ? sertraline (ZOLOFT) 25 MG tablet [Pharmacy Med Name: Sertraline HCl 25 MG Oral Tablet] 90 tablet 0  ?  Sig: TAKE 1 TABLET BY MOUTH ONCE DAILY ALONG WITH '100MG'$  TABLET.  ?  ? Not Delegated - Psychiatry:  Antidepressants - SSRI - sertraline Failed - 03/09/2021  6:25 PM  ?  ?  Failed - This refill cannot be delegated  ?  ?  Failed - AST in normal range and within 360 days  ?  AST  ?Date Value Ref Range Status  ?01/26/2020 11 10 - 35 U/L Final  ?  ?  ?  ?  Failed - ALT in normal range and within 360 days  ?  ALT  ?Date Value Ref Range Status  ?01/26/2020 11 6 - 29 U/L Final  ?  ?  ?  ?  Passed - Completed PHQ-2 or PHQ-9 in the last 360 days  ?  ?  Passed - Valid encounter within last 6 months  ?  Recent Outpatient Visits   ? ?      ? 1 month ago Adrenal nodule ([place])  ? [facility] Steele Sizer, MD  ? 5 months ago Well adult exam  ? [facility] [place], Drue Stager, [place]  ? 10 months ago Mild major depression [facility])  ? [facility] Steele Sizer, MD  ? 1 year ago Adrenal nodule [facility])  ? [facility], Drue Stager, MD  ? 1 year ago Mild major depression [facility])  ? [facility] Delsa Grana, [place]  ? ?  ?  ? ?  ?  ?  ? ?

## 2021-03-14 ENCOUNTER — Telehealth: Payer: Self-pay

## 2021-03-14 ENCOUNTER — Other Ambulatory Visit: Payer: Self-pay | Admitting: Psychiatry

## 2021-03-14 NOTE — Telephone Encounter (Signed)
pt left a message that she needs medications refill on the sertraline ?

## 2021-03-14 NOTE — Telephone Encounter (Addendum)
Correction in the previous message. I have not seen her since the last August. It is hard for me to do refills anymore without seeing her.  Please contact her to make a follow up appointment, and let me know.  ?

## 2021-03-16 ENCOUNTER — Other Ambulatory Visit: Payer: Self-pay | Admitting: Psychiatry

## 2021-03-16 ENCOUNTER — Telehealth: Payer: Self-pay

## 2021-03-16 MED ORDER — SERTRALINE HCL 100 MG PO TABS: 100.0000 mg | ORAL_TABLET | Freq: Every day | ORAL | 0 refills | Status: DC

## 2021-03-16 MED ORDER — SERTRALINE HCL 25 MG PO TABS: 25.0000 mg | ORAL_TABLET | Freq: Every day | ORAL | 0 refills | Status: DC

## 2021-03-16 NOTE — Telephone Encounter (Signed)
pt states she needs enough medication to get to her next appt.  she needs the zoloft 25 and '100mg'$   ?

## 2021-03-16 NOTE — Telephone Encounter (Signed)
Ordered

## 2021-03-21 NOTE — Progress Notes (Signed)
Virtual Visit via Video Note ? ?I connected with Madeline Dominguez on 03/24/21 at  9:30 AM EDT by a video enabled telemedicine application and verified that I am speaking with the correct person using two identifiers. ? ?Location: ?Patient: home ?Provider: office ?Persons participated in the visit- patient, provider  ?  ?I discussed the limitations of evaluation and management by telemedicine and the availability of in person appointments. The patient expressed understanding and agreed to proceed. ? ? ?  ?I discussed the assessment and treatment plan with the patient. The patient was provided an opportunity to ask questions and all were answered. The patient agreed with the plan and demonstrated an understanding of the instructions. ?  ?The patient was advised to call back or seek an in-person evaluation if the symptoms worsen or if the condition fails to improve as anticipated. ? ?I provided 15 minutes of non-face-to-face time during this encounter. ? ? ?Madeline Clay, MD ? ? ? ?BH MD/PA/NP OP Progress Note ? ?03/24/2021 9:59 AM ?Madeline Dominguez  ?MRN:  [id number] ? ?Chief Complaint:  ?Chief Complaint  ?Patient presents with  ? Follow-up  ? Depression  ? ?HPI:  ?- she is not seen since last August.  ?This is a follow-up appointment for depression.  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  It has been going well so far.  She also visits her mother, who has been doing good.  She does not feel much stress compared to before now that her husband is working as well.  She always has good relationship with her children.  She states that her children will tell that she has anhedonia.  She wants to stay in the house most of the time.  She has other depressive symptoms as in PHQ-9.  However, she prefers to stay at the current dose of sertraline as she does not want to be on medication.  She verbalized understanding about attendance policy.  ? ? ?Daily routine: in the  backyard, in the pool, visiting her mother or aunt ?Employment: Restaurant manager, fast food for five months. 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 Her husband was incarcerated in 01/2016  for robbery (violated parol). She was a single parent for 25 years.  ?Marital status: married  ?Number of children: 3, age 98, 57, 73 ? ?Visit Diagnosis:  ?  ICD-10-CM   ?1. Mild episode of recurrent major depressive disorder ([place])  F33.0   ?  ? ? ?Past Psychiatric History: Please see initial evaluation for full details. I have reviewed the history. No updates at this time.  ?  ? ?Past Medical History:  ?Past Medical History:  ?Diagnosis Date  ? Adrenal adenoma   ? a. normal cortisol and aldosterone levels  ? Cough 07/25/2015  ? Dilated cardiomyopathy ([place])   ? a. TTE 2/15: EF of 55-60%, dilated LV & LA, and nl RV; b. TTE 6/16:EF 55-60%, no RWMA, nl LV dias fxn, LV cavity size nl with mild concentric LVH, mildly dilated ascending aorta, LA nl size, RV sys fxn nl, PASP nl; c. TTE 7/17: EF 45-50%, unable to exclude RWMA, nl LV dias faxn, mildly dilated RV w/ nl sys fxn, PASP nl; d. TTE 7/18: EF 45-50%, mild LVH, Gr1DD, mildly dilated LA, mildly dilated RV  ? Goiter   ?  a. s/p radioactive iodine treatment in 2010  ? Heart palpitations   ? Hypertension   ? Iron deficiency   ? Large breasts   ? Morbid obesity ([place])   ? Postpartum hemorrhage   ? Pulmonary embolism ([place]) 07/2015  ? a. s/p Eliquis; b. hypercoag work up neg; c. felt to be 2/2 OCP  ?  ?Past Surgical History:  ?Procedure Laterality Date  ? COLONOSCOPY WITH PROPOFOL N/A 10/25/2020  ? Procedure: COLONOSCOPY WITH PROPOFOL;  Surgeon: Jonathon Bellows, MD;  Location: [facility] ENDOSCOPY;  Service: Gastroenterology;  Laterality: N/A;  ? LAPAROTOMY    ? for after childbirth  ? ? ?Family Psychiatric History: Please see initial evaluation for  full details. I have reviewed the history. No updates at this time.  ?  ? ?Family History:  ?Family History  ?Problem Relation Age of Onset  ? Hypertension Mother   ? CVA Father   ? Deep vein thrombosis Brother   ? Pulmonary embolism Brother   ? Anemia Sister   ? ? ?Social History:  ?Social History  ? ?Socioeconomic History  ? Marital status: Married  ?  Spouse name: Not on file  ? Number of children: 3  ? Years of education: Not on file  ? Highest education level: High school graduate  ?Occupational History  ? Not on file  ?Tobacco Use  ? Smoking status: Never  ? Smokeless tobacco: Never  ?Vaping Use  ? Vaping Use: Never used  ?Substance and Sexual Activity  ? Alcohol use: No  ?  Alcohol/week: 0.0 standard drinks  ?  Comment: rarely  ? Drug use: No  ? Sexual activity: Not Currently  ?  Partners: Male  ?  Birth control/protection: Condom  ?Other Topics Concern  ? Not on file  ?Social History Narrative  ? Lives with 3 with 3 daughters, husband was incarcerated April 2018 - for stealing, she has not been keeping in touch with him because she is disappointed. 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   ? She lost her job secondary COVID-19   ? She is trying to take Med Tech certification   ? ?Social Determinants of Health  ? ?Financial Resource Strain: High Risk  ? Difficulty of Paying Living Expenses: Hard  ?Food Insecurity: Food Insecurity Present  ? Worried About Charity fundraiser in the Last Year: Sometimes true  ? Ran Out of Food in the Last Year: Sometimes true  ?Transportation Needs: No Transportation Needs  ? Lack of Transportation (Medical): No  ? Lack of Transportation (Non-Medical): No  ?Physical Activity: Insufficiently Active  ? Days of Exercise per Week: 2 days  ? Minutes of Exercise per Session: 20 min  ?Stress: Stress Concern Present  ? Feeling of Stress : Very much  ?Social Connections: Socially Integrated  ? Frequency of Communication with Friends and Family:  More than three times a week  ? Frequency of Social Gatherings with Friends and Family: Three times a week  ? Attends Religious Services: More than 4 times per year  ? Active Member of Clubs or Organizations: Yes  ? Attends Archivist Meetings: 1 to 4 times per year  ? Marital Status: Married  ? ? ?Allergies:  ?Allergies  ?Allergen Reactions  ? Aspirin Nausea And Vomiting  ? Hctz [Hydrochlorothiazide] Other (See Comments)  ?  Nausea, cramping  ? Tomato Itching and Swelling  ? ? ?Metabolic Disorder Labs: ?Lab Results  ?Component Value Date  ?  HGBA1C 5.2 01/26/2020  ? MPG 103 01/26/2020  ? MPG 103 05/12/2019  ? ?No results found for: PROLACTIN ?Lab Results  ?Component Value Date  ? CHOL 207 (H) 01/26/2020  ? TRIG 55 01/26/2020  ? HDL 69 01/26/2020  ? CHOLHDL 3.0 01/26/2020  ? VLDL 11 04/27/2016  ? [place] 123 (H) 01/26/2020  ? LDLCALC 109 (H) 05/12/2019  ? ?Lab Results  ?Component Value Date  ? TSH 4.72 (H) 08/24/2019  ? TSH 7.71 (H) 05/12/2019  ? ? ?Therapeutic Level Labs: ?No results found for: LITHIUM ?No results found for: VALPROATE ?No components found for:  CBMZ ? ?Current Medications: ?Current Outpatient Medications  ?Medication Sig Dispense Refill  ? amLODipine-valsartan (EXFORGE) 5-160 MG tablet Take 1 tablet by mouth once daily 30 tablet 0  ? baclofen (LIORESAL) 10 MG tablet Take 1 tablet (10 mg total) by mouth 3 (three) times daily as needed for muscle spasms. 30 tablet 0  ? Cholecalciferol (VITAMIN D) 50 MCG (2000 UT) CAPS Take 1 capsule (2,000 Units total) by mouth daily. 30 capsule 0  ? levothyroxine (SYNTHROID) 50 MCG tablet Take 1 tablet (50 mcg total) by mouth daily. 30 tablet 0  ? meloxicam (MOBIC) 15 MG tablet Take 1 tablet by mouth once daily 90 tablet 0  ? [START ON 03/30/2021] sertraline (ZOLOFT) 100 MG tablet Take 1 tablet (100 mg total) by mouth daily. TAKE 1 TABLET BY MOUTH ONCE DAILY. TAKE WITH '25MG'$  TABLET FOR A TOTAL OF '125MG'$  DAILY. 90 tablet 0  ? [START ON 03/30/2021] sertraline  (ZOLOFT) 25 MG tablet Take 1 tablet (25 mg total) by mouth daily. TAKE 1 TABLET BY MOUTH ONCE DAILY ALONG WITH '100MG'$  TABLET. 90 tablet 0  ? vitamin B-12 1000 MCG tablet Take 1 tablet (1,000 mcg total) by mo

## 2021-03-23 NOTE — Telephone Encounter (Signed)
Pt has an appt for tomorrow.  ?

## 2021-03-24 ENCOUNTER — Other Ambulatory Visit: Payer: Self-pay

## 2021-03-24 ENCOUNTER — Telehealth (INDEPENDENT_AMBULATORY_CARE_PROVIDER_SITE_OTHER): Payer: Medicaid Other | Admitting: Psychiatry

## 2021-03-24 ENCOUNTER — Encounter: Payer: Self-pay | Admitting: Psychiatry

## 2021-03-24 DIAGNOSIS — F330 Major depressive disorder, recurrent, mild: Secondary | ICD-10-CM

## 2021-03-24 MED ORDER — SERTRALINE HCL 100 MG PO TABS: 100.0000 mg | ORAL_TABLET | Freq: Every day | ORAL | 0 refills | Status: DC

## 2021-03-24 MED ORDER — SERTRALINE HCL 25 MG PO TABS: 25.0000 mg | ORAL_TABLET | Freq: Every day | ORAL | 0 refills | Status: DC

## 2021-03-24 NOTE — Patient Instructions (Addendum)
1. Continue sertraline 125 mg at night ?2. Next appointment: 6/20 at 1 PM, video ?

## 2021-03-24 NOTE — Addendum Note (Signed)
Addended by: [name] on: 03/24/2021 10:00 AM ? ? Modules accepted: Level of Service ? ?

## 2021-03-30 ENCOUNTER — Encounter: Payer: Self-pay | Admitting: Family Medicine

## 2021-03-31 ENCOUNTER — Other Ambulatory Visit: Payer: Self-pay | Admitting: Family Medicine

## 2021-03-31 DIAGNOSIS — E039 Hypothyroidism, unspecified: Secondary | ICD-10-CM | POA: Diagnosis not present

## 2021-03-31 DIAGNOSIS — E559 Vitamin D deficiency, unspecified: Secondary | ICD-10-CM | POA: Diagnosis not present

## 2021-03-31 DIAGNOSIS — I10 Essential (primary) hypertension: Secondary | ICD-10-CM | POA: Diagnosis not present

## 2021-03-31 DIAGNOSIS — Z13220 Encounter for screening for lipoid disorders: Secondary | ICD-10-CM | POA: Diagnosis not present

## 2021-03-31 DIAGNOSIS — E538 Deficiency of other specified B group vitamins: Secondary | ICD-10-CM | POA: Diagnosis not present

## 2021-03-31 DIAGNOSIS — Z131 Encounter for screening for diabetes mellitus: Secondary | ICD-10-CM | POA: Diagnosis not present

## 2021-04-01 LAB — COMPLETE METABOLIC PANEL WITH GFR
AG Ratio: 1.2 (calc) (ref 1.0–2.5)
ALT: 8 U/L (ref 6–29)
AST: 10 U/L (ref 10–35)
Albumin: 3.8 g/dL (ref 3.6–5.1)
Alkaline phosphatase (APISO): 79 U/L (ref 37–153)
BUN: 12 mg/dL (ref 7–25)
CO2: 30 mmol/L (ref 20–32)
Calcium: 9 mg/dL (ref 8.6–10.4)
Chloride: 106 mmol/L (ref 98–110)
Creat: 0.68 mg/dL (ref 0.50–1.03)
Globulin: 3.3 g/dL (calc) (ref 1.9–3.7)
Glucose, Bld: 116 mg/dL — ABNORMAL HIGH (ref 65–99)
Potassium: 4 mmol/L (ref 3.5–5.3)
Sodium: 143 mmol/L (ref 135–146)
Total Bilirubin: 0.4 mg/dL (ref 0.2–1.2)
Total Protein: 7.1 g/dL (ref 6.1–8.1)
eGFR: 103 mL/min/{1.73_m2} (ref >=60)

## 2021-04-01 LAB — CBC WITH DIFFERENTIAL/PLATELET
Absolute Monocytes: 351 cells/uL (ref 200–950)
Basophils Absolute: 20 cells/uL (ref 0–200)
Basophils Relative: 0.3 %
Eosinophils Absolute: 169 cells/uL (ref 15–500)
Eosinophils Relative: 2.6 %
HCT: 38.4 % (ref 35.0–45.0)
Hemoglobin: 12.2 g/dL (ref 11.7–15.5)
Lymphs Abs: 2652 cells/uL (ref 850–3900)
MCH: 30.1 pg (ref 27.0–33.0)
MCHC: 31.8 g/dL — ABNORMAL LOW (ref 32.0–36.0)
MCV: 94.8 fL (ref 80.0–100.0)
MPV: 11 fL (ref 7.5–12.5)
Monocytes Relative: 5.4 %
Neutro Abs: 3309 cells/uL (ref 1500–7800)
Neutrophils Relative %: 50.9 %
Platelets: 244 10*3/uL (ref 140–400)
RBC: 4.05 10*6/uL (ref 3.80–5.10)
RDW: 12.7 % (ref 11.0–15.0)
Total Lymphocyte: 40.8 %
WBC: 6.5 10*3/uL (ref 3.8–10.8)

## 2021-04-01 LAB — LIPID PANEL
Cholesterol: 214 mg/dL — ABNORMAL HIGH (ref <200)
HDL: 70 mg/dL (ref >=50)
LDL Cholesterol (Calc): 130 mg/dL (calc) — ABNORMAL HIGH
Non-HDL Cholesterol (Calc): 144 mg/dL (calc) — ABNORMAL HIGH (ref <130)
Total CHOL/HDL Ratio: 3.1 (calc) (ref <5.0)
Triglycerides: 58 mg/dL (ref <150)

## 2021-04-01 LAB — VITAMIN B12: Vitamin B-12: 308 pg/mL (ref 200–1100)

## 2021-04-01 LAB — HEMOGLOBIN A1C
Hgb A1c MFr Bld: 5.2 % of total Hgb (ref <5.7)
Mean Plasma Glucose: 103 mg/dL
eAG (mmol/L): 5.7 mmol/L

## 2021-04-01 LAB — TSH: TSH: 6.43 mIU/L — ABNORMAL HIGH

## 2021-04-01 LAB — VITAMIN D 25 HYDROXY (VIT D DEFICIENCY, FRACTURES): Vit D, 25-Hydroxy: 20 ng/mL — ABNORMAL LOW (ref 30–100)

## 2021-04-03 ENCOUNTER — Other Ambulatory Visit: Payer: Self-pay

## 2021-04-03 ENCOUNTER — Other Ambulatory Visit: Payer: Self-pay | Admitting: Family Medicine

## 2021-04-03 DIAGNOSIS — R7989 Other specified abnormal findings of blood chemistry: Secondary | ICD-10-CM

## 2021-04-03 DIAGNOSIS — E039 Hypothyroidism, unspecified: Secondary | ICD-10-CM

## 2021-04-03 MED ORDER — LEVOTHYROXINE SODIUM 50 MCG PO TABS
50.0000 ug | ORAL_TABLET | Freq: Every day | ORAL | 1 refills | Status: DC
Start: 2021-04-03 — End: 2021-06-15

## 2021-04-03 NOTE — Progress Notes (Signed)
Patient notified, TSH order placed for 6 weeks, patient verbalized understanding and in agreement to recommendations/advice from Dr. Ancil Boozer.  ?

## 2021-05-14 ENCOUNTER — Telehealth: Payer: Medicaid Other | Admitting: Physician Assistant

## 2021-05-14 DIAGNOSIS — R3989 Other symptoms and signs involving the genitourinary system: Secondary | ICD-10-CM | POA: Diagnosis not present

## 2021-05-14 MED ORDER — CEPHALEXIN 500 MG PO CAPS: 500.0000 mg | ORAL_CAPSULE | Freq: Two times a day (BID) | ORAL | 0 refills | Status: AC

## 2021-05-14 NOTE — Progress Notes (Signed)
?Virtual Visit Consent  ? ?Madeline Dominguez, you are scheduled for a virtual visit with a [facility] provider today. Just as with appointments in the office, your consent must be obtained to participate. Your consent will be active for this visit and any virtual visit you may have with one of our providers in the next 365 days. If you have a MyChart account, a copy of this consent can be sent to you electronically. ? ?As this is a virtual visit, video technology does not allow for your provider to perform a traditional examination. This may limit your provider's ability to fully assess your condition. If your provider identifies any concerns that need to be evaluated in person or the need to arrange testing (such as labs, EKG, etc.), we will make arrangements to do so. Although advances in technology are sophisticated, we cannot ensure that it will always work on either your end or our end. If the connection with a video visit is poor, the visit may have to be switched to a telephone visit. With either a video or telephone visit, we are not always able to ensure that we have a secure connection. ? ?By engaging in this virtual visit, you consent to the provision of healthcare and authorize for your insurance to be billed (if applicable) for the services provided during this visit. Depending on your insurance coverage, you may receive a charge related to this service. ? ?I need to obtain your verbal consent now. Are you willing to proceed with your visit today? NAIA RUFF has provided verbal consent on 05/14/2021 for a virtual visit (video or telephone). Madeline Rio, PA-C ? ?Date: 05/14/2021 11:31 AM ? ?Virtual Visit via Video Note  ? ?ILeeanne Dominguez, connected with  HAIZEL Dominguez  ([id number], [birth year]) on 05/14/21 at 11:30 AM EDT by a video-enabled telemedicine application and verified that I am speaking with the correct person using two identifiers. ? ?Location: ?Patient: Virtual Visit  Location Patient: Home ?Provider: Virtual Visit Location Provider: Home Office ?  ?I discussed the limitations of evaluation and management by telemedicine and the availability of in person appointments. The patient expressed understanding and agreed to proceed.   ? ?History of Present Illness: ?Madeline Dominguez is a 56 y.o. who identifies as a female who was assigned female at birth, and is being seen today for possible UTI. Endorses symptoms starting Wednesday with just some slight fatigue and irritation with urinary urgency. Now with this plus dysuria, urinary frequency, suprapubic pressure.  Denies fever, chills, nausea or vomiting. Notes she has felt slightly warm at times. Denies hematuria.  Has tried to keep well-hydrated with water and cranberry juice.  ? ?HPI: HPI  ?Problems:  ?Patient Active Problem List  ? Diagnosis Date Noted  ? Dilated cardiomyopathy ([place]) 05/12/2019  ? Mild major depression ([place]) 05/12/2019  ? Stress incontinence in female 03/25/2017  ? History of deep venous thrombosis (DVT) of distal vein of right lower extremity 01/18/2017  ? Adrenal adenoma, left 09/11/2016  ? Current moderate episode of major depressive disorder without prior episode ([place]) 08/10/2016  ? 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 History of radioactive iodine thyroid ablation   ? 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  ? Iron deficiency anemia 07/27/2015  ? B12 deficiency 07/27/2015  ? History of pulmonary embolus ([place]) 07/25/2015  ? Adrenal nodule ([place]) 07/25/2015  ? Pulmonary nodules/lesions, multiple 07/25/2015  ? OSA (obstructive sleep apnea) 05/18/2015  ? Thyroid nodule  12/17/2014  ? Gastroesophageal reflux disease without esophagitis 09/02/2014  ? Morbid obesity, unspecified obesity type ([place]) 06/22/2014  ? Large breasts 06/22/2014  ? H/O hyperthyroidism 06/22/2014  ? Right ventricular dilation 06/22/2014  ? Goiter, toxic, multinodular 05/22/2013  ? History of toxic  multinodular goiter 03/06/2013  ?  ?Allergies:  ?Allergies  ?Allergen Reactions  ? Aspirin Nausea And Vomiting  ? Hctz [Hydrochlorothiazide] Other (See Comments)  ?  Nausea, cramping  ? Tomato Itching and Swelling  ? ?Medications:  ?Current Outpatient Medications:  ?  amLODipine-valsartan (EXFORGE) 5-160 MG tablet, Take 1 tablet by mouth once daily, Disp: 30 tablet, Rfl: 0 ?  baclofen (LIORESAL) 10 MG tablet, Take 1 tablet (10 mg total) by mouth 3 (three) times daily as needed for muscle spasms., Disp: 30 tablet, Rfl: 0 ?  Cholecalciferol (VITAMIN D) 50 MCG (2000 UT) CAPS, Take 1 capsule (2,000 Units total) by mouth daily., Disp: 30 capsule, Rfl: 0 ?  levothyroxine (SYNTHROID) 50 MCG tablet, Take 1 tablet (50 mcg total) by mouth daily. And one and a half pills on Sunday, Disp: 32 tablet, Rfl: 1 ?  meloxicam (MOBIC) 15 MG tablet, Take 1 tablet by mouth once daily, Disp: 90 tablet, Rfl: 0 ?  sertraline (ZOLOFT) 100 MG tablet, Take 1 tablet (100 mg total) by mouth daily. TAKE 1 TABLET BY MOUTH ONCE DAILY. TAKE WITH '25MG'$  TABLET FOR A TOTAL OF '125MG'$  DAILY., Disp: 90 tablet, Rfl: 0 ?  sertraline (ZOLOFT) 25 MG tablet, Take 1 tablet (25 mg total) by mouth daily. TAKE 1 TABLET BY MOUTH ONCE DAILY ALONG WITH '100MG'$  TABLET., Disp: 90 tablet, Rfl: 0 ?  vitamin B-12 1000 MCG tablet, Take 1 tablet (1,000 mcg total) by mouth daily., Disp: 30 tablet, Rfl: 0 ? ?Observations/Objective: ?Patient is well-developed, well-nourished in no acute distress.  ?Resting comfortably at home.  ?Head is normocephalic, atraumatic.  ?No labored breathing. ?Speech is clear and coherent with logical content.  ?Patient is alert and oriented at baseline.  ? ?Assessment and Plan: ?1. Suspected UTI ? ?Classic symptoms of UTI. No alarm signs or symptoms. Recent renal panel in normal range. Will start empiric treatment for UTI with Keflex 500 mg BID x 7 days. Supportive measures and OTC medications reviewed. Strict precautions for in-person evaluation  reviewed with patient.  ? ?Follow Up Instructions: ?I discussed the assessment and treatment plan with the patient. The patient was provided an opportunity to ask questions and all were answered. The patient agreed with the plan and demonstrated an understanding of the instructions.  A copy of instructions were sent to the patient via MyChart unless otherwise noted below.  ? ?The patient was advised to call back or seek an in-person evaluation if the symptoms worsen or if the condition fails to improve as anticipated. ? ?Time:  ?I spent 10 minutes with the patient via telehealth technology discussing the above problems/concerns.   ? ?Madeline Rio, PA-C ?

## 2021-05-14 NOTE — Patient Instructions (Signed)
?Madeline Dominguez, thank you for joining Leeanne Rio, PA-C for today's virtual visit.  While this provider is not your primary care provider (PCP), if your PCP is located in our provider database this encounter information will be shared with them immediately following your visit. ? ?Consent: ?(Patient) Madeline Dominguez provided verbal consent for this virtual visit at the beginning of the encounter. ? ?Current Medications: ? ?Current Outpatient Medications:  ?  amLODipine-valsartan (EXFORGE) 5-160 MG tablet, Take 1 tablet by mouth once daily, Disp: 30 tablet, Rfl: 0 ?  baclofen (LIORESAL) 10 MG tablet, Take 1 tablet (10 mg total) by mouth 3 (three) times daily as needed for muscle spasms., Disp: 30 tablet, Rfl: 0 ?  Cholecalciferol (VITAMIN D) 50 MCG (2000 UT) CAPS, Take 1 capsule (2,000 Units total) by mouth daily., Disp: 30 capsule, Rfl: 0 ?  levothyroxine (SYNTHROID) 50 MCG tablet, Take 1 tablet (50 mcg total) by mouth daily. And one and a half pills on Sunday, Disp: 32 tablet, Rfl: 1 ?  meloxicam (MOBIC) 15 MG tablet, Take 1 tablet by mouth once daily, Disp: 90 tablet, Rfl: 0 ?  sertraline (ZOLOFT) 100 MG tablet, Take 1 tablet (100 mg total) by mouth daily. TAKE 1 TABLET BY MOUTH ONCE DAILY. TAKE WITH '25MG'$  TABLET FOR A TOTAL OF '125MG'$  DAILY., Disp: 90 tablet, Rfl: 0 ?  sertraline (ZOLOFT) 25 MG tablet, Take 1 tablet (25 mg total) by mouth daily. TAKE 1 TABLET BY MOUTH ONCE DAILY ALONG WITH '100MG'$  TABLET., Disp: 90 tablet, Rfl: 0 ?  vitamin B-12 1000 MCG tablet, Take 1 tablet (1,000 mcg total) by mouth daily., Disp: 30 tablet, Rfl: 0  ? ?Medications ordered in this encounter:  ?No orders of the defined types were placed in this encounter. ?  ? ?*If you need refills on other medications prior to your next appointment, please contact your pharmacy* ? ?Follow-Up: ?Call back or seek an in-person evaluation if the symptoms worsen or if the condition fails to improve as anticipated. ? ?Other  Instructions ?Your symptoms are consistent with a bladder infection, also called acute cystitis. Please take your antibiotic (Keflex) as directed until all pills are gone.  Stay very well hydrated.  Consider a daily probiotic (Align, Culturelle, or Activia) to help prevent stomach upset caused by the antibiotic.  Taking a probiotic daily may also help prevent recurrent UTIs.  Also consider taking AZO (Phenazopyridine) tablets to help decrease pain with urination.   ? ?Urinary Tract Infection ?A urinary tract infection (UTI) can occur any place along the urinary tract. The tract includes the kidneys, ureters, bladder, and urethra. A type of germ called bacteria often causes a UTI. UTIs are often helped with antibiotic medicine.  ?HOME CARE  ?If given, take antibiotics as told by your doctor. Finish them even if you start to feel better. ?Drink enough fluids to keep your pee (urine) clear or pale yellow. ?Avoid tea, drinks with caffeine, and bubbly (carbonated) drinks. ?Pee often. Avoid holding your pee in for a long time. ?Pee before and after having sex (intercourse). ?Wipe from front to back after you poop (bowel movement) if you are a woman. Use each tissue only once. ?GET HELP RIGHT AWAY IF:  ?You have back pain. ?You have lower belly (abdominal) pain. ?You have chills. ?You feel sick to your stomach (nauseous). ?You throw up (vomit). ?Your burning or discomfort with peeing does not go away. ?You have a fever. ?Your symptoms are not better in 3 days. ?MAKE SURE  YOU:  ?Understand these instructions. ?Will watch your condition. ?Will get help right away if you are not doing well or get worse. ?Document Released: 06/06/2007 Document Revised: 09/12/2011 Document Reviewed: 07/19/2011 ?ExitCare? Patient Information ?2015 [employer]. This information is not intended to replace advice given to you by your health care provider. Make sure you discuss any questions you have with your health care provider. ? ? ? ?If you  have been instructed to have an in-person evaluation today at a local Urgent Care facility, please use the link below. It will take you to a list of all of our available [facility], including address, phone number and hours of operation. Please do not delay care.  ?[facility] ? ?If you or a family member do not have a primary care provider, use the link below to schedule a visit and establish care. When you choose a [facility] primary care physician or advanced practice provider, you gain a long-term partner in health. ?Find a Primary Care Provider ? ?Learn more about [facility]'s in-office and virtual care options: ?[facility]  ?

## 2021-06-12 ENCOUNTER — Other Ambulatory Visit: Payer: Self-pay | Admitting: Family Medicine

## 2021-06-12 NOTE — Telephone Encounter (Signed)
Medication Refill - Medication: levothyroxine (SYNTHROID) 50 MCG tablet [[id number]]  Has the patient contacted their pharmacy? Yes.   (Agent: If no, request that the patient contact the pharmacy for the refill. If patient does not wish to contact the pharmacy document the reason why and proceed with request.) (Agent: If yes, when and what did the pharmacy advise?)  Preferred Pharmacy (with phone number or street name):  [facility] [place] [address]  [place] [address]  Phone: [phone] Fax: [phone]  Hours: Not open 24 hours   Has the patient been seen for an appointment in the last year OR does the patient have an upcoming appointment? Yes.    Agent: Please be advised that RX refills may take up to 3 business days. We ask that you follow-up with your pharmacy.

## 2021-06-13 ENCOUNTER — Telehealth: Payer: Self-pay

## 2021-06-13 ENCOUNTER — Other Ambulatory Visit: Payer: Self-pay | Admitting: Family Medicine

## 2021-06-13 NOTE — Telephone Encounter (Signed)
Requested medications are due for refill today.  yes  Requested medications are on the active medications list.  yes  Last refill. 04/03/2021 #32 1 refill  Future visit scheduled.   no  Notes to clinic.  Medication refill failed d/t abnormal labs.     Requested Prescriptions  Pending Prescriptions Disp Refills   levothyroxine (SYNTHROID) 50 MCG tablet 32 tablet 1    Sig: Take 1 tablet (50 mcg total) by mouth daily. And one and a half pills on Sunday     Endocrinology:  Hypothyroid Agents Failed - 06/12/2021  4:22 PM      Failed - TSH in normal range and within 360 days    TSH  Date Value Ref Range Status  03/31/2021 6.43 (H) mIU/L Final    Comment:              Reference Range .           > or = 20 Years  0.40-4.50 .                Pregnancy Ranges           First trimester    0.26-2.66           Second trimester   0.55-2.73           Third trimester    0.43-2.91          Passed - Valid encounter within last 12 months    Recent Outpatient Visits           5 months ago Adrenal nodule [facility])   [facility] Steele Sizer, MD   8 months ago Well adult exam   [facility] Steele Sizer, MD   1 year ago Mild major depression [facility])   [facility] Steele Sizer, MD   1 year ago Adrenal nodule [facility])   [facility] Steele Sizer, MD   1 year ago Mild major depression [facility])   [facility] Delsa Grana, [place]

## 2021-06-13 NOTE — Telephone Encounter (Signed)
Spoke with patient and clarified she only needs to come in for [facility] lab draw, she does not need to see Dr. Ancil Boozer. Patient stated she will try and come one morning this week as she is having transportation issues. She will contact us if any problems arise or if she is unable to come in.

## 2021-06-14 ENCOUNTER — Ambulatory Visit: Payer: Self-pay

## 2021-06-14 ENCOUNTER — Other Ambulatory Visit: Payer: Self-pay | Admitting: Family Medicine

## 2021-06-14 DIAGNOSIS — R7989 Other specified abnormal findings of blood chemistry: Secondary | ICD-10-CM | POA: Diagnosis not present

## 2021-06-14 DIAGNOSIS — E039 Hypothyroidism, unspecified: Secondary | ICD-10-CM | POA: Diagnosis not present

## 2021-06-14 NOTE — Telephone Encounter (Signed)
Pt called in is out of levothyroxine (SYNTHROID) 50 MCG tablet. She hasn't taken this med, in over 1 week. See rx TE from 06/13. Pt had lab work done today, when asked if she has symptoms, pt states she isn't sure what to look out for. She is needing this until her blood work comes in. Please send to if possible,  [facility]), [place]  Phone: [phone]  Fax: [phone]      Chief Complaint: Requesting refill on levothyroxine. States had labs done today. Symptoms: n/a Frequency: n/a Pertinent Negatives: Patient denies n/a Disposition: '[]'$ ED /'[]'$ Urgent Care (no appt availability in office) / '[]'$ Appointment(In office/virtual)/ '[]'$  [facility]/ '[]'$ Home Care/ '[]'$ Refused Recommended Disposition /'[]'$ [facility]/ '[x]'$  Follow-up with PCP Additional Notes:   Answer Assessment - Initial Assessment Questions 1. DRUG NAME: "What medicine do you need to have refilled?"     Levothyroxine 2. REFILLS REMAINING: "How many refills are remaining?" (Note: The label on the medicine or pill bottle will show how many refills are remaining. If there are no refills remaining, then a renewal may be needed.)     0 3. EXPIRATION DATE: "What is the expiration date?" (Note: The label states when the prescription will expire, and thus can no longer be refilled.)     N/a 4. PRESCRIBING HCP: "Who prescribed it?" Reason: If prescribed by specialist, call should be referred to that group.     Dr. Ancil Boozer 5. SYMPTOMS: "Do you have any symptoms?"     N/a 6. PREGNANCY: "Is there any chance that you are pregnant?" "When was your last menstrual period?"     No  Protocols used: Medication Refill and Renewal Call-A-AH

## 2021-06-15 ENCOUNTER — Other Ambulatory Visit: Payer: Self-pay | Admitting: Family Medicine

## 2021-06-15 LAB — TSH: TSH: 7.3 mIU/L — ABNORMAL HIGH

## 2021-06-15 MED ORDER — LEVOTHYROXINE SODIUM 50 MCG PO TABS: 50.0000 ug | ORAL_TABLET | Freq: Every day | ORAL | 1 refills | Status: DC

## 2021-06-18 NOTE — Progress Notes (Unsigned)
Virtual Visit via Video Note  I connected with Madeline Dominguez on 06/20/21 at  1:00 PM EDT by a video enabled telemedicine application and verified that I am speaking with the correct person using two identifiers.  Location: Patient: home Provider: office Persons participated in the visit- patient, provider    I discussed the limitations of evaluation and management by telemedicine and the availability of in person appointments. The patient expressed understanding and agreed to proceed.     I discussed the assessment and treatment plan with the patient. The patient was provided an opportunity to ask questions and all were answered. The patient agreed with the plan and demonstrated an understanding of the instructions.   The patient was advised to call back or seek an in-person evaluation if the symptoms worsen or if the condition fails to improve as anticipated.  I provided 10 minutes of non-face-to-face time during this encounter.   Madeline Clay, MD    [facility] MD/PA/NP OP Progress Note  06/20/2021 1:19 PM Madeline Dominguez  MRN:  [id number]  Chief Complaint:  Chief Complaint  Patient presents with   Follow-up   Depression   HPI:  This is a follow-up appointment for depression.  She states that her friend is in the hospital due to stroke.  Her uncle is in the hospital for depression.  Although she feels down about these, she finds it helpful to be with her husband and her children.  She had a good cookout with her family the other time.  She also likes the current job.  She prefers it to the previous job as she does not need to directly communicate with customers.  She denies feeling depressed.  She sleeps better.  She denies anhedonia.  She denies SI.  She feels comfortable to stay on the current dose of sertraline.    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 Her husband was incarcerated in 01/2016  for robbery (violated parol). She was a single parent for 25 years.  Marital status: married  Number of children: 3, age 74, 43, 67    Visit Diagnosis:    ICD-10-CM   1. MDD (major depressive disorder), recurrent, in partial remission ([place])  F33.41       Past Psychiatric History: Please see initial evaluation for full details. I have reviewed the history. No updates at this time.     Past Medical History:  Past Medical History:  Diagnosis Date   Adrenal adenoma    a. normal cortisol and aldosterone levels   Cough 07/25/2015   Dilated cardiomyopathy ([place])    a. TTE 2/15: EF of 55-60%, dilated LV & LA, and nl RV; b. TTE 6/16:EF 55-60%, no RWMA, nl LV dias fxn, LV cavity size nl with mild concentric LVH, mildly dilated ascending aorta, LA nl size, RV sys fxn nl, PASP nl; c. TTE 7/17: EF 45-50%, unable to exclude RWMA, nl LV dias faxn, mildly dilated RV w/ nl sys fxn, PASP nl; d. TTE 7/18: EF 45-50%, mild LVH, Gr1DD, mildly dilated LA, mildly dilated RV   Goiter    a. s/p radioactive iodine treatment in 2010   Heart palpitations    Hypertension  Iron deficiency    Large breasts    Morbid obesity ([place])    Postpartum hemorrhage    Pulmonary embolism ([place]) 07/2015   a. s/p Eliquis; b. hypercoag work up neg; c. felt to be 2/2 OCP    Past Surgical History:  Procedure Laterality Date   COLONOSCOPY WITH PROPOFOL N/A 10/25/2020   Procedure: COLONOSCOPY WITH PROPOFOL;  Surgeon: Jonathon Bellows, MD;  Location: [facility] ENDOSCOPY;  Service: Gastroenterology;  Laterality: N/A;   LAPAROTOMY     for after childbirth    Family Psychiatric History: Please see initial evaluation for full details. I have reviewed the history. No updates at this time.     Family History:  Family History  Problem  Relation Age of Onset   Hypertension Mother    CVA Father    Deep vein thrombosis Brother    Pulmonary embolism Brother    Anemia Sister     Social History:  Social History   Socioeconomic History   Marital status: Married    Spouse name: Not on file   Number of children: 3   Years of education: Not on file   Highest education level: High school graduate  Occupational History   Not on file  Tobacco Use   Smoking status: Never   Smokeless tobacco: Never  Vaping Use   Vaping Use: Never used  Substance and Sexual Activity   Alcohol use: No    Alcohol/week: 0.0 standard drinks of alcohol    Comment: rarely   Drug use: No   Sexual activity: Not Currently    Partners: Male    Birth control/protection: Condom  Other Topics Concern   Not on file  Social History Narrative   Lives with 3 with 3 daughters, husband was incarcerated April 2018 - for stealing, she has not been keeping in touch with him because she is disappointed. 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    Social Determinants of Health   Financial Resource Strain: High Risk (09/29/2020)   Overall Financial Resource Strain (CARDIA)    Difficulty of Paying Living Expenses: Hard  Food Insecurity: Food Insecurity Present (09/29/2020)   Hunger Vital Sign    Worried About Running Out of Food in the Last Year: Sometimes true    Ran Out of Food in the Last Year: Sometimes true  Transportation Needs: No Transportation Needs (09/29/2020)   PRAPARE - Hydrologist (Medical): No    Lack of Transportation (Non-Medical): No  Physical Activity: Insufficiently Active (09/29/2020)   Exercise Vital Sign    Days of Exercise per Week: 2 days    Minutes of Exercise per Session: 20 min  Stress: Stress Concern Present (09/29/2020)   [place]    Feeling of Stress : Very much  Social Connections: Socially Integrated (09/29/2020)   Social Connection and Isolation Panel [NHANES]    Frequency of Communication with Friends and Family: More than three times a week    Frequency of Social Gatherings with Friends and Family: Three times a week    Attends Religious Services: More than 4 times per year    Active Member of Clubs or Organizations: Yes    Attends Archivist Meetings: 1 to 4 times per year    Marital Status: Married  Allergies:  Allergies  Allergen Reactions   Aspirin Nausea And Vomiting   Hctz [Hydrochlorothiazide] Other (See Comments)    Nausea, cramping   Tomato Itching and Swelling    Metabolic Disorder Labs: Lab Results  Component Value Date   HGBA1C 5.2 03/31/2021   MPG 103 03/31/2021   MPG 103 01/26/2020   No results found for: "PROLACTIN" Lab Results  Component Value Date   CHOL 214 (H) 03/31/2021   TRIG 58 03/31/2021   HDL 70 03/31/2021   CHOLHDL 3.1 03/31/2021   VLDL 11 04/27/2016   LDLCALC 130 (H) 03/31/2021   LDLCALC 123 (H) 01/26/2020   Lab Results  Component Value Date   TSH 7.30 (H) 06/14/2021   TSH 6.43 (H) 03/31/2021    Therapeutic Level Labs: No results found for: "LITHIUM" No results found for: "VALPROATE" No results found for: "CBMZ"  Current Medications: Current Outpatient Medications  Medication Sig Dispense Refill   amLODipine-valsartan (EXFORGE) 5-160 MG tablet Take 1 tablet by mouth once daily 30 tablet 0   baclofen (LIORESAL) 10 MG tablet Take 1 tablet (10 mg total) by mouth 3 (three) times daily as needed for muscle spasms. 30 tablet 0   Cholecalciferol (VITAMIN D) 50 MCG (2000 UT) CAPS Take 1 capsule (2,000 Units total) by mouth daily. 30 capsule 0   levothyroxine (SYNTHROID) 50 MCG tablet Take 1 tablet (50 mcg total) by mouth daily. And one and a half pills twice weekly , repeat TSH in 6 weeks 34 tablet 1   meloxicam  (MOBIC) 15 MG tablet Take 1 tablet by mouth once daily 90 tablet 0   [START ON 06/29/2021] sertraline (ZOLOFT) 100 MG tablet Take 1 tablet (100 mg total) by mouth daily. TAKE 1 TABLET BY MOUTH ONCE DAILY. TAKE WITH '25MG'$  TABLET FOR A TOTAL OF '125MG'$  DAILY. 90 tablet 0   [START ON 06/29/2021] sertraline (ZOLOFT) 25 MG tablet Take 1 tablet (25 mg total) by mouth daily. TAKE 1 TABLET BY MOUTH ONCE DAILY ALONG WITH '100MG'$  TABLET. 90 tablet 0   vitamin B-12 1000 MCG tablet Take 1 tablet (1,000 mcg total) by mouth daily. 30 tablet 0   No current facility-administered medications for this visit.     Musculoskeletal: Strength & Muscle Tone:  N/A Gait & Station:  N/A Patient leans: N/A  Psychiatric Specialty Exam: Review of Systems  Psychiatric/Behavioral:  Negative for agitation, behavioral problems, confusion, decreased concentration, dysphoric mood, hallucinations, self-injury, sleep disturbance and suicidal ideas. The patient is nervous/anxious. The patient is not hyperactive.   All other systems reviewed and are negative.   There were no vitals taken for this visit.There is no height or weight on file to calculate BMI.  General Appearance: Fairly Groomed  Eye Contact:  Good  Speech:  Clear and Coherent  Volume:  Normal  Mood:   down  Affect:  Appropriate, Congruent, and calm  Thought Process:  Coherent  Orientation:  Full (Time, Place, and Person)  Thought Content: Logical   Suicidal Thoughts:  No  Homicidal Thoughts:  No  Memory:  Immediate;   Good  Judgement:  Good  Insight:  Good  Psychomotor Activity:  Normal  Concentration:  Concentration: Good and Attention Span: Good  Recall:  Good  Fund of Knowledge: Good  Language: Good  Akathisia:  No  Handed:  Right  AIMS (if indicated): not done  Assets:  Communication Skills Desire for Improvement  ADL's:  Intact  Cognition: WNL  Sleep:  Fair   Screenings: GAD-7  [place] Office Visit from 01/21/2020 in [facility] Office Visit from 07/22/2017 in [facility]  Total GAD-7 Score 3 3      PHQ2-9    Flowsheet Row Video Visit from 03/24/2021 in [place] Video Visit from 01/11/2021 in [facility] Office Visit from 09/29/2020 in [facility] Video Visit from 08/29/2020 in [place] Video Visit from 05/31/2020 in [place]  PHQ-2 Total Score 4 0 '6 3 2  '$ PHQ-9 Total Score '8 6 11 8 6      '$ Flowsheet Row Admission (Discharged) from 10/25/2020 in [facility] Video Visit from 03/01/2020 in [place]  C-SSRS RISK CATEGORY No Risk No Risk        Assessment and Plan:  HAEDYN BREAU is a 56 y.o. year old female with a history of depression, hypertension, dilated cardiomyopathy, adrenal adenoma (undergoing evaluation), bilateral pulmonary embolism, obesity, obstructive sleep apnea on CPAP, history of toxic multinodular goiter, who presents for follow up appointment for below.   1. MDD (major depressive disorder), recurrent, in partial remission ([place])  She denies significant mood symptoms since the last visit.  Recent psychosocial stressors includes the illness of her uncle and her friend.  Other psychosocial stressors includes financial strain.  Will continue current dose of sertraline to target depression.    Plan 1. Continue sertraline 125 mg at night 2. Next appointment: 9/14 at 8:30 for 30 mins, in person - discussed attendance policy   The patient demonstrates the following risk factors for suicide: Chronic risk factors for suicide include: psychiatric disorder of depression. Acute risk factors for suicide include: unemployment and loss (financial, interpersonal, professional). Protective factors for this patient include: positive social support and hope for the future. Considering these  factors, the overall suicide risk at this point appears to be low.       Collaboration of Care: Collaboration of Care: Other N/A  Patient/Guardian was advised Release of Information must be obtained prior to any record release in order to collaborate their care with an outside provider. Patient/Guardian was advised if they have not already done so to contact the registration department to sign all necessary forms in order for [place] to release information regarding their care.   Consent: Patient/Guardian gives verbal consent for treatment and assignment of benefits for services provided during this visit. Patient/Guardian expressed understanding and agreed to proceed.    Madeline Clay, MD 06/20/2021, 1:19 PM

## 2021-06-20 ENCOUNTER — Encounter: Payer: Self-pay | Admitting: Psychiatry

## 2021-06-20 ENCOUNTER — Telehealth (INDEPENDENT_AMBULATORY_CARE_PROVIDER_SITE_OTHER): Payer: Medicaid Other | Admitting: Psychiatry

## 2021-06-20 DIAGNOSIS — F3341 Major depressive disorder, recurrent, in partial remission: Secondary | ICD-10-CM

## 2021-06-20 MED ORDER — SERTRALINE HCL 100 MG PO TABS
100.0000 mg | ORAL_TABLET | Freq: Every day | ORAL | 0 refills | Status: DC
Start: 2021-06-29 — End: 2021-09-18

## 2021-06-20 MED ORDER — SERTRALINE HCL 25 MG PO TABS: 25.0000 mg | ORAL_TABLET | Freq: Every day | ORAL | 0 refills | Status: DC

## 2021-06-20 NOTE — Patient Instructions (Signed)
1. Continue sertraline 125 mg at night 2. Next appointment: 9/14 at 8:30, in person

## 2021-08-09 ENCOUNTER — Other Ambulatory Visit: Payer: Self-pay | Admitting: Family Medicine

## 2021-08-09 NOTE — Telephone Encounter (Signed)
Medication Refill - Medication: levothyroxine (SYNTHROID) 50 MCG tablet  Has the patient contacted their pharmacy? Yes.   Pt told to contact provider  Preferred Pharmacy (with phone number or street name):  [facility] [place] [address] Phone:  [phone]  Fax:  [phone]     Has the patient been seen for an appointment in the last year OR does the patient have an upcoming appointment? Yes.    Agent: Please be advised that RX refills may take up to 3 business days. We ask that you follow-up with your pharmacy.

## 2021-08-10 ENCOUNTER — Other Ambulatory Visit: Payer: Self-pay

## 2021-08-10 DIAGNOSIS — E039 Hypothyroidism, unspecified: Secondary | ICD-10-CM

## 2021-08-10 DIAGNOSIS — R7989 Other specified abnormal findings of blood chemistry: Secondary | ICD-10-CM

## 2021-08-10 NOTE — Telephone Encounter (Signed)
Called and spoke with patient and she said that she just had labs on 06-14-2021 and that no one told her that she needed to have a followup appt and repeat any labs. 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 I told her that we start labs at 8am and she said that she has a 30 min drive to work and that would make her late. She again stated that she has 2 pills left and if they could not be called in then she would just not take any meds. I told her that I would give her the message.

## 2021-08-10 NOTE — Telephone Encounter (Signed)
Requested medication (s) are due for refill today: yes  Requested medication (s) are on the active medication list: yes  Last refill:  06/15/21 #34 1 refill  Future visit scheduled: no  Notes to clinic:  unable to refill per protocol. Tapered dose. Do you want to refill Rx?     Requested Prescriptions  Pending Prescriptions Disp Refills   levothyroxine (SYNTHROID) 50 MCG tablet 34 tablet 1    Sig: Take 1 tablet (50 mcg total) by mouth daily. And one and a half pills twice weekly , repeat TSH in 6 weeks     Endocrinology:  Hypothyroid Agents Failed - 08/10/2021 12:10 PM      Failed - TSH in normal range and within 360 days    TSH  Date Value Ref Range Status  06/14/2021 7.30 (H) mIU/L Final    Comment:              Reference Range .           > or = 20 Years  0.40-4.50 .                Pregnancy Ranges           First trimester    0.26-2.66           Second trimester   0.55-2.73           Third trimester    0.43-2.91          Passed - Valid encounter within last 12 months    Recent Outpatient Visits           7 months ago Adrenal nodule [facility])   [facility] Steele Sizer, MD   10 months ago Well adult exam   [facility] Steele Sizer, MD   1 year ago Mild major depression [facility])   [facility] Steele Sizer, MD   1 year ago Adrenal nodule [facility])   [facility] Steele Sizer, MD   2 years ago Mild major depression [facility])   [facility] Delsa Grana, [place]

## 2021-08-11 ENCOUNTER — Other Ambulatory Visit: Payer: Self-pay | Admitting: Family Medicine

## 2021-08-11 DIAGNOSIS — E039 Hypothyroidism, unspecified: Secondary | ICD-10-CM

## 2021-08-11 MED ORDER — LEVOTHYROXINE SODIUM 50 MCG PO TABS: 50.0000 ug | ORAL_TABLET | Freq: Every day | ORAL | 0 refills | Status: DC

## 2021-08-28 ENCOUNTER — Telehealth: Payer: Self-pay

## 2021-08-28 NOTE — Telephone Encounter (Signed)
Copied from [facility] [id number]. Topic: General - Other >> Aug 28, 2021 12:34 PM [place] B wrote: Reason for [id number]:[id number] called in needs orders for lab rework, she previously had on 0811, says Dr Ancil Boozer wants to retest to see if med dosage needs to be increased.

## 2021-09-01 ENCOUNTER — Other Ambulatory Visit: Payer: Self-pay | Admitting: Internal Medicine

## 2021-09-01 ENCOUNTER — Telehealth: Payer: Self-pay | Admitting: Family Medicine

## 2021-09-01 DIAGNOSIS — I10 Essential (primary) hypertension: Secondary | ICD-10-CM

## 2021-09-01 MED ORDER — AMLODIPINE BESYLATE-VALSARTAN 5-160 MG PO TABS: 1.0000 | ORAL_TABLET | Freq: Every day | ORAL | 0 refills | Status: DC

## 2021-09-01 NOTE — Progress Notes (Unsigned)
Name: Madeline Dominguez   MRN: [id number]    DOB: 07/31/1965   Date:09/05/2021       Progress Note  Subjective  Chief Complaint  Medication Refill  HPI  Cardiomyopathy: she takes Exforge daily.  Only symptom is SOB with moderate activity , denies orthopnea or wheezing, occasionally has lower extremity edema. She has OSA but not compliant with CPAP, discussed importance of compliance   The 10-year ASCVD risk score (Arnett DK, et al., 2019) is: 4.2%   Values used to calculate the score:     Age: 56 years     Sex: Female     Is Non-Hispanic African American: Yes     Diabetic: No     Tobacco smoker: No     Systolic Blood Pressure: 937 mmHg     Is BP treated: Yes     HDL Cholesterol: 70 mg/dL     Total Cholesterol: 214 mg/dL    HTN: bp today is at goal, no chest pain or palpitation  used to see nephrologist but released from their care.  She takes Exforge . She has noticed episodes of angioedema (after she eats tomatoes and used to have that as a child) , she stopped ACE and tomatoes and no angioedema since Dec 2020 .   Depression: severe episode in 2018 with anhedonia, lack of appetite, she had pain all over, unable to work for months. She is still seeing psychiatrist and taking zoloft 125 mg daily , she has a job now and seems to be feeling better  Adrenal nodule left/Goiter : no symptoms, many tests done, she is going to [facility]  last visit 09/ it was considered non functional adenoma. She also had a goiter, status post iodine ablation , she is taking levothyroxine 50 mcg daily and twice a week half pill    Morbid obesity: discussed life style modification again, weight is trending, cannot be active due to knee pain. Discussed water aerobic, she has a pool at home but too many steps to go in and out, advised a ramp   GERD: she states off medications and no longer having problems Unchanged    OA: she is now taking Aleve daily, we will give her meloxicam to take every other day and try  tylenol on days off Meloxicam,  she daily pain aching pain on both knees, left worse than right side,  she states it feels  stiff,usually worse when she first starts to move  It affects her gait and sometimes has effusion, seen by Ortho but too young for surgery    Patient Active Problem List   Diagnosis Date Noted   Dilated cardiomyopathy ([place]) 05/12/2019   Mild major depression ([place]) 05/12/2019   Stress incontinence in female 03/25/2017   History of deep venous thrombosis (DVT) of distal vein of right lower extremity 01/18/2017   Adrenal adenoma, left 09/11/2016   Current moderate episode of major depressive disorder without prior episode ([place]) 08/10/2016   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[place]) 07/10/2016   History of radioactive iodine thyroid ablation    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   Iron deficiency anemia 07/27/2015   B12 deficiency 07/27/2015   History of pulmonary embolus (PE) 07/25/2015   Adrenal nodule ([place]) 07/25/2015   Pulmonary nodules/lesions, multiple 07/25/2015   OSA (obstructive sleep apnea) 05/18/2015   Thyroid nodule 12/17/2014   Gastroesophageal reflux disease without esophagitis 09/02/2014   Morbid obesity, unspecified obesity type ([place]) 06/22/2014   Large breasts 06/22/2014  H/O hyperthyroidism 06/22/2014   Right ventricular dilation 06/22/2014   Goiter, toxic, multinodular 05/22/2013   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    Past Surgical History:  Procedure Laterality Date   COLONOSCOPY WITH PROPOFOL N/A 10/25/2020   Procedure: COLONOSCOPY WITH PROPOFOL;  Surgeon: Jonathon Bellows, MD;  Location[facility];  Service: Gastroenterology;  Laterality: N/A;   LAPAROTOMY     for after childbirth    Family History  Problem Relation Age of Onset   Hypertension Mother    CVA Father    Deep vein thrombosis Brother    Pulmonary embolism Brother    Anemia Sister     Social History   Tobacco Use    Smoking status: Never   Smokeless tobacco: Never  Substance Use Topics   Alcohol use: No    Alcohol/week: 0.0 standard drinks of alcohol    Comment: rarely     Current Outpatient Medications:    amLODipine-valsartan (EXFORGE) 5-160 MG tablet, Take 1 tablet by mouth daily., Disp: 7 tablet, Rfl: 0   baclofen (LIORESAL) 10 MG tablet, Take 1 tablet (10 mg total) by mouth 3 (three) times daily as needed for muscle spasms., Disp: 30 tablet, Rfl: 0   Cholecalciferol (VITAMIN D) 50 MCG (2000 UT) CAPS, Take 1 capsule (2,000 Units total) by mouth daily., Disp: 30 capsule, Rfl: 0   levothyroxine (SYNTHROID) 50 MCG tablet, Take 1 tablet (50 mcg total) by mouth daily. And one and a half pills twice weekly , repeat TSH in 6 weeks, Disp: 10 tablet, Rfl: 0   sertraline (ZOLOFT) 100 MG tablet, Take 1 tablet (100 mg total) by mouth daily. TAKE 1 TABLET BY MOUTH ONCE DAILY. TAKE WITH '25MG'$  TABLET FOR A TOTAL OF '125MG'$  DAILY., Disp: 90 tablet, Rfl: 0   sertraline (ZOLOFT) 25 MG tablet, Take 1 tablet (25 mg total) by mouth daily. TAKE 1 TABLET BY MOUTH ONCE DAILY ALONG WITH '100MG'$  TABLET., Disp: 90 tablet, Rfl: 0   vitamin B-12 1000 MCG tablet, Take 1 tablet (1,000 mcg total) by mouth daily., Disp: 30 tablet, Rfl: 0   meloxicam (MOBIC) 15 MG tablet, Take 1 tablet by mouth once daily (Patient not taking: Reported on 09/05/2021), Disp: 90 tablet, Rfl: 0  Allergies  Allergen Reactions   Aspirin Nausea And Vomiting   Hctz [Hydrochlorothiazide] Other (See Comments)    Nausea, cramping   Tomato Itching and Swelling    I personally reviewed active problem list, medication list, allergies, family history, social history, health maintenance with the patient/caregiver today.   ROS  Constitutional: Negative for fever , positive for weight change.  Respiratory: Negative for cough , positive for  shortness of breath with moderate activity .   Cardiovascular: Negative for chest pain or palpitations.  Gastrointestinal:  Negative for abdominal pain, no bowel changes.  Musculoskeletal: positive  for gait problem due to knee pain she also has  intermittent knee  joint swelling.  Skin: Negative for rash.  Neurological: Negative for dizziness or headache.  No other specific complaints in a complete review of systems (except as listed in HPI above).   Objective  Vitals:   09/05/21 0910  BP: 132/74  Pulse: 95  Resp: 16  SpO2: 94%  Weight: (!) 392 lb (177.8 kg)  Height: '5\' 7"'$  (1.702 m)    Body mass index is 61.4 kg/m.  Physical Exam  Constitutional: Patient appears well-developed and well-nourished. Obese  No distress.  HEENT: head atraumatic, normocephalic, pupils equal and reactive to light, neck  supple Cardiovascular: Normal rate, regular rhythm and normal heart sounds.  No murmur heard. No BLE edema. Pulmonary/Chest: Effort normal and breath sounds normal. No respiratory distress. Abdominal: Soft.  There is no tenderness. Psychiatric: Patient has a normal mood and affect. behavior is normal. Judgment and thought content normal.   Recent Results (from the past 2160 hour(s))  TSH     Status: Abnormal   Collection Time: 06/14/21  8:09 AM  Result Value Ref Range   TSH 7.30 (H) mIU/L    Comment:           Reference Range .           > or = 20 Years  0.40-4.50 .                Pregnancy Ranges           First trimester    0.26-2.66           Second trimester   0.55-2.73           Third trimester    0.43-2.91     PHQ2/9:    09/05/2021    9:15 AM 03/24/2021    9:44 AM 01/11/2021   11:26 AM 09/29/2020    9:28 AM 08/29/2020    1:28 PM  Depression screen PHQ 2/9  Decreased Interest 1  0 3   Down, Depressed, Hopeless 1  0 3   PHQ - 2 Score 2  0 6   Altered sleeping '3  3 2   '$ Tired, decreased energy 0  1 2   Change in appetite 0  1 0   Feeling bad or failure about yourself  0  1 0   Trouble concentrating 0  0 1   Moving slowly or fidgety/restless 0  0 0   Suicidal thoughts 0  0 0   PHQ-9  Score '5  6 11   '$ Difficult doing work/chores   Somewhat difficult       Information is confidential and restricted. Go to Review Flowsheets to unlock data.    phq 9 is positive   Fall Risk:    09/05/2021    9:15 AM 09/29/2020    9:28 AM 05/04/2020   11:43 AM 01/21/2020    9:01 AM 07/14/2019    3:08 PM  Fall Risk   Falls in the past year? 0 0 0 0 0  Number falls in past yr: 0 0 0 0 0  Injury with Fall? 0 0 0 0 0  Risk for fall due to : No Fall Risks No Fall Risks     Follow up Falls prevention discussed Falls prevention discussed         Functional Status Survey: Is the patient deaf or have difficulty hearing?: No Does the patient have difficulty seeing, even when wearing glasses/contacts?: No Does the patient have difficulty concentrating, remembering, or making decisions?: No Does the patient have difficulty walking or climbing stairs?: Yes Does the patient have difficulty dressing or bathing?: No Does the patient have difficulty doing errands alone such as visiting a doctor's office or shopping?: No    Assessment & Plan  1. Morbid obesity ([place])  Discussed with the patient the risk posed by an increased BMI. Discussed importance of portion control, calorie counting and at least 150 minutes of physical activity weekly. Avoid sweet beverages and drink more water. Eat at least 6 servings of fruit and vegetables daily     2. Dilated cardiomyopathy ([place])  Stable   3. Hypothyroidism, adult  TSH  4. Vitamin D deficiency  Continue supplementation  5. Gastroesophageal reflux disease without esophagitis   6. Primary osteoarthritis of both knees  - meloxicam (MOBIC) 15 MG tablet; Take 1 tablet (15 mg total) by mouth daily.  Dispense: 90 tablet; Refill: 0  7. Hypertension, benign  - amLODipine-valsartan (EXFORGE) 5-160 MG tablet; Take 1 tablet by mouth daily.  Dispense: 90 tablet; Refill: 1  8. Muscle spasm  - baclofen (LIORESAL) 10 MG tablet; Take 1 tablet (10 mg  total) by mouth 3 (three) times daily as needed for muscle spasms.  Dispense: 30 tablet; Refill: 0  9. Adrenal adenoma, left   On CT

## 2021-09-01 NOTE — Telephone Encounter (Signed)
FYI

## 2021-09-01 NOTE — Telephone Encounter (Signed)
Patient not agreeing with having to have an appt for refill She states she will come Tuesday for her labs to be drawn, also says the had an appt in [place] I told her I show no appt had in June.

## 2021-09-01 NOTE — Telephone Encounter (Signed)
Last seen Sept 2022, pt needs an appt for refills.

## 2021-09-05 ENCOUNTER — Ambulatory Visit: Payer: Medicaid Other | Admitting: Family Medicine

## 2021-09-05 ENCOUNTER — Encounter: Payer: Self-pay | Admitting: Family Medicine

## 2021-09-05 DIAGNOSIS — I420 Dilated cardiomyopathy: Secondary | ICD-10-CM

## 2021-09-05 DIAGNOSIS — D3502 Benign neoplasm of left adrenal gland: Secondary | ICD-10-CM | POA: Diagnosis not present

## 2021-09-05 DIAGNOSIS — K219 Gastro-esophageal reflux disease without esophagitis: Secondary | ICD-10-CM | POA: Diagnosis not present

## 2021-09-05 DIAGNOSIS — E559 Vitamin D deficiency, unspecified: Secondary | ICD-10-CM | POA: Diagnosis not present

## 2021-09-05 DIAGNOSIS — M62838 Other muscle spasm: Secondary | ICD-10-CM | POA: Diagnosis not present

## 2021-09-05 DIAGNOSIS — M170 Bilateral primary osteoarthritis of knee: Secondary | ICD-10-CM | POA: Diagnosis not present

## 2021-09-05 DIAGNOSIS — Z862 Personal history of diseases of the blood and blood-forming organs and certain disorders involving the immune mechanism: Secondary | ICD-10-CM

## 2021-09-05 DIAGNOSIS — I10 Essential (primary) hypertension: Secondary | ICD-10-CM

## 2021-09-05 DIAGNOSIS — Z923 Personal history of irradiation: Secondary | ICD-10-CM

## 2021-09-05 DIAGNOSIS — E039 Hypothyroidism, unspecified: Secondary | ICD-10-CM

## 2021-09-05 MED ORDER — BACLOFEN 10 MG PO TABS: 10.0000 mg | ORAL_TABLET | Freq: Three times a day (TID) | ORAL | 0 refills | Status: DC | PRN

## 2021-09-05 MED ORDER — AMLODIPINE BESYLATE-VALSARTAN 5-160 MG PO TABS: 1.0000 | ORAL_TABLET | Freq: Every day | ORAL | 1 refills | Status: DC

## 2021-09-05 MED ORDER — MELOXICAM 15 MG PO TABS: 15.0000 mg | ORAL_TABLET | Freq: Every day | ORAL | 0 refills | Status: DC

## 2021-09-06 LAB — TSH: TSH: 6.16 mIU/L — ABNORMAL HIGH

## 2021-09-07 ENCOUNTER — Other Ambulatory Visit: Payer: Self-pay | Admitting: Family Medicine

## 2021-09-07 DIAGNOSIS — E039 Hypothyroidism, unspecified: Secondary | ICD-10-CM

## 2021-09-07 MED ORDER — LEVOTHYROXINE SODIUM 50 MCG PO TABS: 50.0000 ug | ORAL_TABLET | Freq: Every day | ORAL | 0 refills | Status: DC

## 2021-09-09 ENCOUNTER — Other Ambulatory Visit: Payer: Self-pay | Admitting: Family Medicine

## 2021-09-09 DIAGNOSIS — E039 Hypothyroidism, unspecified: Secondary | ICD-10-CM

## 2021-09-13 NOTE — Progress Notes (Deleted)
[place] MD/PA/NP OP Progress Note  09/13/2021 9:39 AM Madeline Dominguez  MRN:  [id number]  Chief Complaint: No chief complaint on file.  HPI: *** Visit Diagnosis: No diagnosis found.  Past Psychiatric History: Please see initial evaluation for full details. I have reviewed the history. No updates at this time.     Past Medical History:  Past Medical History:  Diagnosis Date   Adrenal adenoma    a. normal cortisol and aldosterone levels   Cough 07/25/2015   Dilated cardiomyopathy ([place])    a. TTE 2/15: EF of 55-60%, dilated LV & LA, and nl RV; b. TTE 6/16:EF 55-60%, no RWMA, nl LV dias fxn, LV cavity size nl with mild concentric LVH, mildly dilated ascending aorta, LA nl size, RV sys fxn nl, PASP nl; c. TTE 7/17: EF 45-50%, unable to exclude RWMA, nl LV dias faxn, mildly dilated RV w/ nl sys fxn, PASP nl; d. TTE 7/18: EF 45-50%, mild LVH, Gr1DD, mildly dilated LA, mildly dilated RV   Goiter    a. s/p radioactive iodine treatment in 2010   Heart palpitations    Hypertension    Iron deficiency    Large breasts    Morbid obesity ([place])    Postpartum hemorrhage    Pulmonary embolism ([place]) 07/2015   a. s/p Eliquis; b. hypercoag work up neg; c. felt to be 2/2 OCP    Past Surgical History:  Procedure Laterality Date   COLONOSCOPY WITH PROPOFOL N/A 10/25/2020   Procedure: COLONOSCOPY WITH PROPOFOL;  Surgeon: Jonathon Bellows, MD;  [facility] END[facility]COPY;  Service: Gastroenterology;  Laterality: N/A;   LAPAROTOMY     for after childbirth    Family Psychiatric History: Please see initial evaluation for full details. I have reviewed the history. No updates at this time.     Family History:  Family History  Problem Relation Age of Onset   Hypertension Mother    CVA Father    Deep vein thrombosis Brother    Pulmonary embolism Brother    Anemia Sister     Social History:  Social History   Socioeconomic History   Marital status: Married    Spouse name: Not on file   Number of children:  3   Years of education: Not on file   Highest education level: High school graduate  Occupational History   Not on file  Tobacco Use   Smoking status: Never   Smokeless tobacco: Never  Vaping Use   Vaping Use: Never used  Substance and Sexual Activity   Alcohol use: No    Alcohol/week: 0.0 standard drinks of alcohol    Comment: rarely   Drug use: No   Sexual activity: Not Currently    Partners: Male    Birth control/protection: Condom  Other Topics Concern   Not on file  Social History Narrative   Lives with 3 with 3 daughters, husband was incarcerated April 2018 - for stealing, she has not been keeping in touch with him because she is disappointed. 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    Social Determinants of Health   Financial Resource Strain: High Risk (09/29/2020)   Overall Financial Resource Strain (CARDIA)    Difficulty of Paying Living Expenses: Hard  Food Insecurity: Food Insecurity Present (09/29/2020)   Hunger Vital Sign    Worried About  Running Out of Food in the Last Year: Sometimes true    Ran Out of Food in the Last Year: Sometimes true  Transportation Needs: No Transportation Needs (09/29/2020)   PRAPARE - Hydrologist (Medical): No    Lack of Transportation (Non-Medical): No  Physical Activity: Insufficiently Active (09/29/2020)   Exercise Vital Sign    Days of Exercise per Week: 2 days    Minutes of Exercise per Session: 20 min  Stress: Stress Concern Present (09/29/2020)   [place]    Feeling of Stress : Very much  Social Connections: Socially Integrated (09/29/2020)   Social Connection and Isolation Panel [NHANES]    Frequency of Communication with Friends and Family: More than three times a week    Frequency of Social Gatherings  with Friends and Family: Three times a week    Attends Religious Services: More than 4 times per year    Active Member of Clubs or Organizations: Yes    Attends Archivist Meetings: 1 to 4 times per year    Marital Status: Married    Allergies:  Allergies  Allergen Reactions   Aspirin Nausea And Vomiting   Hctz [Hydrochlorothiazide] Other (See Comments)    Nausea, cramping   Tomato Itching and Swelling    Metabolic Disorder Labs: Lab Results  Component Value Date   HGBA1C 5.2 03/31/2021   MPG 103 03/31/2021   MPG 103 01/26/2020   No results found for: "PROLACTIN" Lab Results  Component Value Date   CHOL 214 (H) 03/31/2021   TRIG 58 03/31/2021   HDL 70 03/31/2021   CHOLHDL 3.1 03/31/2021   VLDL 11 04/27/2016   LDLCALC 130 (H) 03/31/2021   LDLCALC 123 (H) 01/26/2020   Lab Results  Component Value Date   TSH 6.16 (H) 09/05/2021   TSH 7.30 (H) 06/14/2021    Therapeutic Level Labs: No results found for: "LITHIUM" No results found for: "VALPROATE" No results found for: "CBMZ"  Current Medications: Current Outpatient Medications  Medication Sig Dispense Refill   amLODipine-valsartan (EXFORGE) 5-160 MG tablet Take 1 tablet by mouth daily. 90 tablet 1   baclofen (LIORESAL) 10 MG tablet Take 1 tablet (10 mg total) by mouth 3 (three) times daily as needed for muscle spasms. 30 tablet 0   Cholecalciferol (VITAMIN D) 50 MCG (2000 UT) CAPS Take 1 capsule (2,000 Units total) by mouth daily. 30 capsule 0   levothyroxine (SYNTHROID) 50 MCG tablet Take 1 tablet (50 mcg total) by mouth daily. And twice a week take two pills, recheck in 6 weeks. 114 tablet 0   meloxicam (MOBIC) 15 MG tablet Take 1 tablet (15 mg total) by mouth daily. 90 tablet 0   sertraline (ZOLOFT) 100 MG tablet Take 1 tablet (100 mg total) by mouth daily. TAKE 1 TABLET BY MOUTH ONCE DAILY. TAKE WITH '25MG'$  TABLET FOR A TOTAL OF '125MG'$  DAILY. 90 tablet 0   sertraline (ZOLOFT) 25 MG tablet Take 1 tablet (25  mg total) by mouth daily. TAKE 1 TABLET BY MOUTH ONCE DAILY ALONG WITH '100MG'$  TABLET. 90 tablet 0   vitamin B-12 1000 MCG tablet Take 1 tablet (1,000 mcg total) by mouth daily. 30 tablet 0   No current facility-administered medications for this visit.     Musculoskeletal: Strength & Muscle Tone: within normal limits Gait & Station: normal Patient leans: N/A  Psychiatric Specialty Exam: Review of Systems  There were no vitals taken  for this visit.There is no height or weight on file to calculate BMI.  General Appearance: {Appearance:22683}  Eye Contact:  {BHH EYE CONTACT:22684}  Speech:  Clear and Coherent  Volume:  Normal  Mood:  {BHH MOOD:22306}  Affect:  {Affect (PAA):22687}  Thought Process:  Coherent  Orientation:  Full (Time, Place, and Person)  Thought Content: Logical   Suicidal Thoughts:  {ST/HT (PAA):22692}  Homicidal Thoughts:  {ST/HT (PAA):22692}  Memory:  Immediate;   Good  Judgement:  {Judgement (PAA):22694}  Insight:  {Insight (PAA):22695}  Psychomotor Activity:  Normal  Concentration:  Concentration: Good and Attention Span: Good  Recall:  Good  Fund of Knowledge: Good  Language: Good  Akathisia:  No  Handed:  Right  AIMS (if indicated): not done  Assets:  Communication Skills Desire for Improvement  ADL's:  Intact  Cognition: WNL  Sleep:  {BHH GOOD/FAIR/POOR:22877}   Screenings: GAD-7    Flowsheet Row Office Visit from 01/21/2020 in [facility] Office Visit from 07/22/2017 in [facility]  Total GAD-7 Score 3 3      PHQ2-9    [place] Office Visit from 09/05/2021 in [facility] Video Visit from 03/24/2021 in [place] Video Visit from 01/11/2021 in [facility] Office Visit from 09/29/2020 in [facility] Video Visit from 08/29/2020 in [place]  PHQ-2 Total Score 2 4 0 6 3  PHQ-9 Total Score '5  8 6 11 8      '$ Flowsheet Row Admission (Discharged) from 10/25/2020 in [place] [facility] Video Visit from 03/01/2020 in [facility]  C-SSRS RISK CATEGORY No Risk No Risk        Assessment and Plan:  Madeline Dominguez is a 56 y.o. year old female with a history of depression, hypertension, dilated cardiomyopathy, adrenal adenoma (undergoing evaluation), bilateral pulmonary embolism, obesity, obstructive sleep apnea on CPAP, history of toxic multinodular goiter, who presents for follow up appointment for below.    1. MDD (major depressive disorder), recurrent, in partial remission ([place])  She denies significant mood symptoms since the last visit.  Recent psychosocial stressors includes the illness of her uncle and her friend.  Other psychosocial stressors includes financial strain.  Will continue current dose of sertraline to target depression.    Plan 1. Continue sertraline 125 mg at night 2. Next appointment: 9/14 at 8:30 for 30 mins, in person - discussed attendance policy   The patient demonstrates the following risk factors for suicide: Chronic risk factors for suicide include: psychiatric disorder of depression. Acute risk factors for suicide include: unemployment and loss (financial, interpersonal, professional). Protective factors for this patient include: positive social support and hope for the future. Considering these factors, the overall suicide risk at this point appears to be low.           Collaboration of Care: Collaboration of Care: {BH OP Collaboration of Care:21014065}  Patient/Guardian was advised Release of Information must be obtained prior to any record release in order to collaborate their care with an outside provider. Patient/Guardian was advised if they have not already done so to contact the registration department to sign all necessary forms in order for [place] to release information regarding their care.    Consent: Patient/Guardian gives verbal consent for treatment and assignment of benefits for services provided during this visit. Patient/Guardian expressed understanding and agreed to proceed.    [name], MD 09/13/2021, 9:39 AM

## 2021-09-14 ENCOUNTER — Ambulatory Visit: Payer: Medicaid Other | Admitting: Psychiatry

## 2021-09-18 ENCOUNTER — Other Ambulatory Visit: Payer: Self-pay | Admitting: Psychiatry

## 2021-09-18 NOTE — Telephone Encounter (Signed)
Patient no showed for her appointment on 09-14-21. Patient did not leave message to cancel or reschedule.  Dr. Modesta Messing requested she be discharged due to no show/cancellation policy. Request given to Janett Billow 09-18-21, upon returning to office.

## 2021-09-18 NOTE — Telephone Encounter (Signed)
Understood , I was not aware. Please communicate with patient .Dr.Hisada could decide if she wants to give her a months supply of medication once she returns to office.

## 2021-09-22 NOTE — Telephone Encounter (Signed)
It seems like Dr. Shea Evans prescribed her two weeks of medications as a bridge. Will defer further prescription to her PCP if needed.

## 2021-09-28 ENCOUNTER — Other Ambulatory Visit: Payer: Self-pay | Admitting: Psychiatry

## 2021-09-28 MED ORDER — SERTRALINE HCL 25 MG PO TABS: 25.0000 mg | ORAL_TABLET | Freq: Every day | ORAL | 0 refills | Status: DC

## 2021-09-28 MED ORDER — SERTRALINE HCL 100 MG PO TABS: 100.0000 mg | ORAL_TABLET | Freq: Every day | ORAL | 0 refills | Status: DC

## 2021-10-04 ENCOUNTER — Encounter: Payer: Self-pay | Admitting: Family Medicine

## 2021-10-05 ENCOUNTER — Other Ambulatory Visit: Payer: Self-pay | Admitting: Family Medicine

## 2021-10-05 DIAGNOSIS — F321 Major depressive disorder, single episode, moderate: Secondary | ICD-10-CM

## 2021-10-05 NOTE — Telephone Encounter (Signed)
Medication Refill - Medication:  sertraline (ZOLOFT) 100 MG tablet sertraline (ZOLOFT) 25 MG tablet Has the patient contacted their pharmacy? Yes.    Preferred Pharmacy (with phone number or street name):  [facility] [place] - [address] Phone:  [phone]  Fax:  [phone]     Has the patient been seen for an appointment in the last year OR does the patient have an upcoming appointment? Yes.    Agent: Please be advised that RX refills may take up to 3 business days. We ask that you follow-up with your pharmacy.  Prescribing provider no longer treat pt

## 2021-10-06 ENCOUNTER — Other Ambulatory Visit: Payer: Self-pay | Admitting: Family Medicine

## 2021-10-06 DIAGNOSIS — F321 Major depressive disorder, single episode, moderate: Secondary | ICD-10-CM

## 2021-10-06 MED ORDER — SERTRALINE HCL 25 MG PO TABS: 25.0000 mg | ORAL_TABLET | Freq: Every day | ORAL | 1 refills | Status: DC

## 2021-10-06 MED ORDER — SERTRALINE HCL 100 MG PO TABS: 100.0000 mg | ORAL_TABLET | Freq: Every day | ORAL | 1 refills | Status: DC

## 2021-10-06 NOTE — Telephone Encounter (Signed)
Requested medication (s) are due for refill today:   Provider to review  Requested medication (s) are on the active medication list:   Yes for both  Future visit scheduled:   Yes with Dr. Ancil Boozer 10/18/2021   Last ordered: Both on 09/28/2021 #30, 0 refills  See note in chart from pt.   Returned because prescribed by another provider.      Requested Prescriptions  Pending Prescriptions Disp Refills   sertraline (ZOLOFT) 100 MG tablet 30 tablet 0    Sig: Take 1 tablet (100 mg total) by mouth daily. Total of 125 mg daily. Take along with 25 mg tab.     Psychiatry:  Antidepressants - SSRI - sertraline Passed - 10/05/2021  4:58 PM      Passed - AST in normal range and within 360 days    AST  Date Value Ref Range Status  03/31/2021 10 10 - 35 U/L Final         Passed - ALT in normal range and within 360 days    ALT  Date Value Ref Range Status  03/31/2021 8 6 - 29 U/L Final         Passed - Completed PHQ-2 or PHQ-9 in the last 360 days      Passed - Valid encounter within last 6 months    Recent Outpatient Visits           1 month ago Morbid obesity [facility])   [facility] Steele Sizer, MD   8 months ago Adrenal nodule [facility])   [facility] Steele Sizer, MD   1 year ago Well adult exam   [facility], Drue Stager, MD   1 year ago Mild major depression [facility])   [facility] Steele Sizer, MD   1 year ago Adrenal nodule [facility])   [facility] Steele Sizer, MD       Future Appointments             In 1 week Steele Sizer, MD [facility], [place]   In 5 months Steele Sizer, MD [facility]             sertraline (ZOLOFT) 25 MG tablet 15 tablet 0     Psychiatry:  Antidepressants - SSRI - sertraline Passed - 10/05/2021  4:58 PM      Passed - AST in normal range and within 360 days    AST  Date Value Ref Range Status   03/31/2021 10 10 - 35 U/L Final         Passed - ALT in normal range and within 360 days    ALT  Date Value Ref Range Status  03/31/2021 8 6 - 29 U/L Final         Passed - Completed PHQ-2 or PHQ-9 in the last 360 days      Passed - Valid encounter within last 6 months    Recent Outpatient Visits           1 month ago Morbid obesity [facility])   [facility] Steele Sizer, MD   8 months ago Adrenal nodule [facility])   [facility] Steele Sizer, MD   1 year ago Well adult exam   [facility] Steele Sizer, MD   1 year ago Mild major depression [facility])   [facility] Steele Sizer, MD   1 year ago Adrenal  nodule [facility])   [facility] Steele Sizer, MD       Future Appointments             In 1 week Steele Sizer, MD [facility], [place]   In 5 months Steele Sizer, MD [facility], [facility]

## 2021-10-17 NOTE — Progress Notes (Deleted)
Name: Madeline Dominguez   MRN: [id number]    DOB: 10-Dec-1965   Date:10/17/2021       Progress Note  Subjective  Chief Complaint  Annual Exam  HPI  Patient presents for annual CPE.  Diet: *** Exercise: ***  Last Eye Exam: *** Last Dental Exam: ***  Flowsheet Row Office Visit from 01/21/2020 in [facility]  AUDIT-C Score 0      Depression: Phq 9 is  {Desc; negative/positive:13464}    09/05/2021    9:15 AM 03/24/2021    9:44 AM 01/11/2021   11:26 AM 09/29/2020    9:28 AM 08/29/2020    1:28 PM  Depression screen PHQ 2/9  Decreased Interest 1  0 3   Down, Depressed, Hopeless 1  0 3   PHQ - 2 Score 2  0 6   Altered sleeping 3  3 2    Tired, decreased energy 0  1 2   Change in appetite 0  1 0   Feeling bad or failure about yourself  0  1 0   Trouble concentrating 0  0 1   Moving slowly or fidgety/restless 0  0 0   Suicidal thoughts 0  0 0   PHQ-9 Score 5  6 11    Difficult doing work/chores   Somewhat difficult       Information is confidential and restricted. Go to Review Flowsheets to unlock data.   Hypertension: BP Readings from Last 3 Encounters:  09/05/21 132/74  10/25/20 (!) 99/57  09/29/20 132/86   Obesity: Wt Readings from Last 3 Encounters:  09/05/21 (!) 392 lb (177.8 kg)  10/25/20 (!) 370 lb (167.8 kg)  09/29/20 (!) 386 lb (175.1 kg)   BMI Readings from Last 3 Encounters:  09/05/21 61.40 kg/m  10/25/20 57.95 kg/m  09/29/20 60.46 kg/m     Vaccines:   HPV: N/A Tdap: up to date Shingrix: N/A Pneumonia: N/A Flu: due COVID-19: up to date   Hep C Screening: 01/26/20 STD testing and prevention (HIV/chl/gon/syphilis): 12/20/15 Intimate partner violence: negative screen  Sexual History : Menstrual History/LMP/Abnormal Bleeding:  Discussed importance of follow up if any post-menopausal bleeding: {Response; yes/no/na:63}  Incontinence Symptoms: negative for symptoms   Breast cancer:  - Last Mammogram: 06/01/16, ordered today - BRCA  gene screening: N/A  Osteoporosis Prevention : Discussed high calcium and vitamin D supplementation, weight bearing exercises Bone density: N/A   Cervical cancer screening: 09/29/20  Skin cancer: Discussed monitoring for atypical lesions  Colorectal cancer: 10/25/20   Lung cancer:  Low Dose CT Chest recommended if Age 54-80 years, 40 pack-year currently smoking OR have quit w/in 15years. Patient does not qualify for screen   ECG: 08/02/16  Advanced Care Planning: A voluntary discussion about advance care planning including the explanation and discussion of advance directives.  Discussed health care proxy and Living will, and the patient was able to identify a health care proxy as ***.  Patient does not have a living will and power of attorney of health care   Lipids: Lab Results  Component Value Date   CHOL 214 (H) 03/31/2021   CHOL 207 (H) 01/26/2020   CHOL 201 (H) 05/12/2019   Lab Results  Component Value Date   HDL 70 03/31/2021   HDL 69 01/26/2020   HDL 76 05/12/2019   Lab Results  Component Value Date   LDLCALC 130 (H) 03/31/2021   LDLCALC 123 (H) 01/26/2020   LDLCALC 109 (H) 05/12/2019   Lab Results  Component Value Date   TRIG 58 03/31/2021   TRIG 55 01/26/2020   TRIG 72 05/12/2019   Lab Results  Component Value Date   CHOLHDL 3.1 03/31/2021   CHOLHDL 3.0 01/26/2020   CHOLHDL 2.6 05/12/2019   No results found for: "LDLDIRECT"  Glucose: Glucose, Bld  Date Value Ref Range Status  03/31/2021 116 (H) 65 - 99 mg/dL Final    Comment:    .            Fasting reference interval . For someone without known diabetes, a glucose value between 100 and 125 mg/dL is consistent with prediabetes and should be confirmed with a follow-up test. .   01/26/2020 127 (H) 65 - 99 mg/dL Final    Comment:    .            Fasting reference interval . For someone without known diabetes, a glucose value >125 mg/dL indicates that they may have diabetes and this should be  confirmed with a follow-up test. .   05/12/2019 98 65 - 99 mg/dL Final    Comment:    .            Fasting reference interval .    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   Iron deficiency anemia 07/27/2015   B12 deficiency 07/27/2015   History of pulmonary embolus (PE) 07/25/2015   Pulmonary nodules/lesions, multiple 07/25/2015   OSA (obstructive sleep apnea) 05/18/2015   Thyroid nodule 12/17/2014   Gastroesophageal reflux disease without esophagitis 09/02/2014   Morbid obesity, unspecified obesity type ([place]) 06/22/2014   Large breasts 06/22/2014   H/O hyperthyroidism 06/22/2014   Right ventricular dilation 06/22/2014   Goiter, toxic, multinodular 05/22/2013   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    Past Surgical History:  Procedure Laterality Date   COLONOSCOPY WITH PROPOFOL N/A 10/25/2020   Procedure: COLONOSCOPY WITH PROPOFOL;  Surgeon: Jonathon Bellows, MD;  Location: [facility] ENDOSCOPY;  Service: Gastroenterology;  Laterality: N/A;   LAPAROTOMY     for after childbirth    Family History  Problem Relation Age of Onset   Hypertension Mother    CVA Father    Deep vein thrombosis Brother    Pulmonary embolism Brother    Anemia Sister     Social History   Socioeconomic History   Marital status: Married    Spouse name: Not on file   Number of children: 3   Years of education:  Not on file   Highest education level: High school graduate  Occupational History   Not on file  Tobacco Use   Smoking status: Never   Smokeless tobacco: Never  Vaping Use   Vaping Use: Never used  Substance and Sexual Activity   Alcohol use: No    Alcohol/week: 0.0 standard drinks of alcohol    Comment: rarely   Drug use: No   Sexual activity: Not Currently    Partners: Male  Birth control/protection: Condom  Other Topics Concern   Not on file  Social History Narrative   Lives with 3 with 3 daughters, husband was incarcerated April 2018 - for stealing, she has not been keeping in touch with him because she is disappointed. 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    Social Determinants of Health   Financial Resource Strain: High Risk (09/29/2020)   Overall Financial Resource Strain (CARDIA)    Difficulty of Paying Living Expenses: Hard  Food Insecurity: Food Insecurity Present (09/29/2020)   Hunger Vital Sign    Worried About Running Out of Food in the Last Year: Sometimes true    Ran Out of Food in the Last Year: Sometimes true  Transportation Needs: No Transportation Needs (09/29/2020)   PRAPARE - Hydrologist (Medical): No    Lack of Transportation (Non-Medical): No  Physical Activity: Insufficiently Active (09/29/2020)   Exercise Vital Sign    Days of Exercise per Week: 2 days    Minutes of Exercise per Session: 20 min  Stress: Stress Concern Present (09/29/2020)   [place]    Feeling of Stress : Very much  Social Connections: Socially Integrated (09/29/2020)   Social Connection and Isolation Panel [NHANES]    Frequency of Communication with Friends and Family: More than three times a week    Frequency of Social Gatherings with Friends and Family:  Three times a week    Attends Religious Services: More than 4 times per year    Active Member of Clubs or Organizations: Yes    Attends Archivist Meetings: 1 to 4 times per year    Marital Status: Married  Human resources officer Violence: Not At Risk (09/29/2020)   Humiliation, Afraid, Rape, and Kick questionnaire    Fear of Current or Ex-Partner: No    Emotionally Abused: No    Physically Abused: No    Sexually Abused: No     Current Outpatient Medications:    amLODipine-valsartan (EXFORGE) 5-160 MG tablet, Take 1 tablet by mouth daily., Disp: 90 tablet, Rfl: 1   baclofen (LIORESAL) 10 MG tablet, Take 1 tablet (10 mg total) by mouth 3 (three) times daily as needed for muscle spasms., Disp: 30 tablet, Rfl: 0   Cholecalciferol (VITAMIN D) 50 MCG (2000 UT) CAPS, Take 1 capsule (2,000 Units total) by mouth daily., Disp: 30 capsule, Rfl: 0   levothyroxine (SYNTHROID) 50 MCG tablet, Take 1 tablet (50 mcg total) by mouth daily. And twice a week take two pills, recheck in 6 weeks., Disp: 114 tablet, Rfl: 0   meloxicam (MOBIC) 15 MG tablet, Take 1 tablet (15 mg total) by mouth daily., Disp: 90 tablet, Rfl: 0   sertraline (ZOLOFT) 100 MG tablet, Take 1 tablet (100 mg total) by mouth daily. Total of 125 mg daily. Take along with 25 mg tab., Disp: 30 tablet, Rfl: 1   sertraline (ZOLOFT) 25 MG tablet, Take 1 tablet (25 mg total) by mouth daily., Disp: 30 tablet, Rfl: 1   vitamin B-12 1000 MCG tablet, Take 1 tablet (1,000 mcg total) by mouth daily., Disp: 30 tablet, Rfl: 0  Allergies  Allergen Reactions   Aspirin Nausea And Vomiting   Hctz [Hydrochlorothiazide] Other (See Comments)    Nausea, cramping   Tomato Itching  and Swelling     ROS  ***  Objective  There were no vitals filed for this visit.  There is no height or weight on file to calculate BMI.  Physical Exam ***  Recent Results (from the past 2160 hour(s))  TSH     Status: Abnormal   Collection Time: 09/05/21 10:12 AM   Result Value Ref Range   TSH 6.16 (H) mIU/L    Comment:           Reference Range .           > or = 20 Years  0.40-4.50 .                Pregnancy Ranges           First trimester    0.26-2.66           Second trimester   0.55-2.73           Third trimester    0.43-2.91      Fall Risk:    09/05/2021    9:15 AM 09/29/2020    9:28 AM 05/04/2020   11:43 AM 01/21/2020    9:01 AM 07/14/2019    3:08 PM  Fall Risk   Falls in the past year? 0 0 0 0 0  Number falls in past yr: 0 0 0 0 0  Injury with Fall? 0 0 0 0 0  Risk for fall due to : No Fall Risks No Fall Risks     Follow up Falls prevention discussed Falls prevention discussed        Functional Status Survey:     Assessment & Plan  1. Well adult exam ***   -USPSTF grade A and B recommendations reviewed with patient; age-appropriate recommendations, preventive care, screening tests, etc discussed and encouraged; healthy living encouraged; see AVS for patient education given to patient -Discussed importance of 150 minutes of physical activity weekly, eat two servings of fish weekly, eat one serving of tree nuts ( cashews, pistachios, pecans, almonds.Marland Kitchen) every other day, eat 6 servings of fruit/vegetables daily and drink plenty of water and avoid sweet beverages.   -Reviewed Health Maintenance: Yes.

## 2021-10-18 ENCOUNTER — Encounter: Payer: Medicaid Other | Admitting: Family Medicine

## 2021-10-18 DIAGNOSIS — Z0000 Encounter for general adult medical examination without abnormal findings: Secondary | ICD-10-CM

## 2021-11-13 ENCOUNTER — Other Ambulatory Visit: Payer: Self-pay | Admitting: Family Medicine

## 2021-11-13 DIAGNOSIS — E039 Hypothyroidism, unspecified: Secondary | ICD-10-CM

## 2021-11-13 NOTE — Telephone Encounter (Signed)
Pt called reporting that she needs the full prescription and not a partial because insurance will not cover. Please advise.

## 2021-11-21 NOTE — Patient Instructions (Signed)
Preventive Care 40-54 Years Old, Female Preventive care refers to lifestyle choices and visits with your health care provider that can promote health and wellness. Preventive care visits are also called wellness exams. What can I expect for my preventive care visit? Counseling Your health care provider may ask you questions about your: Medical history, including: Past medical problems. Family medical history. Pregnancy history. Current health, including: Menstrual cycle. Method of birth control. Emotional well-being. Home life and relationship well-being. Sexual activity and sexual health. Lifestyle, including: Alcohol, nicotine or tobacco, and drug use. Access to firearms. Diet, exercise, and sleep habits. Work and work environment. Sunscreen use. Safety issues such as seatbelt and bike helmet use. Physical exam Your health care provider will check your: Height and weight. These may be used to calculate your BMI (body mass index). BMI is a measurement that tells if you are at a healthy weight. Waist circumference. This measures the distance around your waistline. This measurement also tells if you are at a healthy weight and may help predict your risk of certain diseases, such as type 2 diabetes and high blood pressure. Heart rate and blood pressure. Body temperature. Skin for abnormal spots. What immunizations do I need?  Vaccines are usually given at various ages, according to a schedule. Your health care provider will recommend vaccines for you based on your age, medical history, and lifestyle or other factors, such as travel or where you work. What tests do I need? Screening Your health care provider may recommend screening tests for certain conditions. This may include: Lipid and cholesterol levels. Diabetes screening. This is done by checking your blood sugar (glucose) after you have not eaten for a while (fasting). Pelvic exam and Pap test. Hepatitis B test. Hepatitis C  test. HIV (human immunodeficiency virus) test. STI (sexually transmitted infection) testing, if you are at risk. Lung cancer screening. Colorectal cancer screening. Mammogram. Talk with your health care provider about when you should start having regular mammograms. This may depend on whether you have a family history of breast cancer. BRCA-related cancer screening. This may be done if you have a family history of breast, ovarian, tubal, or peritoneal cancers. Bone density scan. This is done to screen for osteoporosis. Talk with your health care provider about your test results, treatment options, and if necessary, the need for more tests. Follow these instructions at home: Eating and drinking  Eat a diet that includes fresh fruits and vegetables, whole grains, lean protein, and low-fat dairy products. Take vitamin and mineral supplements as recommended by your health care provider. Do not drink alcohol if: Your health care provider tells you not to drink. You are pregnant, may be pregnant, or are planning to become pregnant. If you drink alcohol: Limit how much you have to 0-1 drink a day. Know how much alcohol is in your drink. In the [place] one drink equals one 12 oz bottle of beer (355 mL), one 5 oz glass of wine (148 mL), or one 1 oz glass of hard liquor (44 mL). Lifestyle Brush your teeth every morning and night with fluoride toothpaste. Floss one time each day. Exercise for at least 30 minutes 5 or more days each week. Do not use any products that contain nicotine or tobacco. These products include cigarettes, chewing tobacco, and vaping devices, such as e-cigarettes. If you need help quitting, ask your health care provider. Do not use drugs. If you are sexually active, practice safe sex. Use a condom or other form of protection to   prevent STIs. If you do not wish to become pregnant, use a form of birth control. If you plan to become pregnant, see your health care provider for a  prepregnancy visit. Take aspirin only as told by your health care provider. Make sure that you understand how much to take and what form to take. Work with your health care provider to find out whether it is safe and beneficial for you to take aspirin daily. Find healthy ways to manage stress, such as: Meditation, yoga, or listening to music. Journaling. Talking to a trusted person. Spending time with friends and family. Minimize exposure to UV radiation to reduce your risk of skin cancer. Safety Always wear your seat belt while driving or riding in a vehicle. Do not drive: If you have been drinking alcohol. Do not ride with someone who has been drinking. When you are tired or distracted. While texting. If you have been using any mind-altering substances or drugs. Wear a helmet and other protective equipment during sports activities. If you have firearms in your house, make sure you follow all gun safety procedures. Seek help if you have been physically or sexually abused. What's next? Visit your health care provider once a year for an annual wellness visit. Ask your health care provider how often you should have your eyes and teeth checked. Stay up to date on all vaccines. This information is not intended to replace advice given to you by your health care provider. Make sure you discuss any questions you have with your health care provider. Document Revised: 06/15/2020 Document Reviewed: 06/15/2020 Elsevier Patient Education  [place].

## 2021-11-21 NOTE — Progress Notes (Unsigned)
Name: Madeline Dominguez   MRN: [id number]    DOB: Apr 06, 1965   Date:11/22/2021       Progress Note  Subjective  Chief Complaint  Annual Exam  HPI  Patient presents for annual CPE.  Diet: she is drinking sodas 2-3 cans per day, she is trying to cut down by mixing it with ice Exercise: none, discussed 150 minutes per week  Last Eye Exam: she is due for an exam Last Dental Exam: last year, due for an exam  [place] Office Visit from 11/22/2021 in [facility]  AUDIT-C Score 1      Depression: Phq 9 is  positive    11/22/2021    8:35 AM 09/05/2021    9:15 AM 03/24/2021    9:44 AM 01/11/2021   11:26 AM 09/29/2020    9:28 AM  Depression screen PHQ 2/9  Decreased Interest 1 1  0 3  Down, Depressed, Hopeless 1 1  0 3  PHQ - 2 Score 2 2  0 6  Altered sleeping _0 Tired, decreased energy 0 0  1 2  Change in appetite 0 0  1 0  Feeling bad or failure about yourself  0 0  1 0  Trouble concentrating 3 0  0 1  Moving slowly or fidgety/restless 0 0  0 0  Suicidal thoughts 0 0  0 0  PHQ-9 Score _1 Difficult doing work/chores    Somewhat difficult      Information is confidential and restricted. Go to Review Flowsheets to unlock data.   Hypertension: BP Readings from Last 3 Encounters:  11/22/21 122/76  09/05/21 132/74  10/25/20 (!) 99/57   Obesity: Wt Readings from Last 3 Encounters:  11/22/21 (!) 392 lb (177.8 kg)  09/05/21 (!) 392 lb (177.8 kg)  10/25/20 (!) 370 lb (167.8 kg)   BMI Readings from Last 3 Encounters:  11/22/21 61.40 kg/m  09/05/21 61.40 kg/m  10/25/20 57.95 kg/m     Vaccines:   HPV: N/A Tdap: up to date Shingrix: she will think about it  Pneumonia: today  Flu: today  COVID-19: up to date   Hep C Screening: 01/26/20 STD testing and prevention (HIV/chl/gon/syphilis): 12/20/15 Intimate partner violence: negative screen  Sexual History : she has been sexually  Menstrual History/LMP/Abnormal Bleeding: menopausal   Discussed importance of follow up if any post-menopausal bleeding: yes  Incontinence Symptoms: mild stress incontinence, discussed kegel exercises   Breast cancer:  - Last Mammogram: 06/01/16, due - BRCA gene screening: N/A - she thinks her brother has colon cancer   Osteoporosis Prevention : Discussed high calcium and vitamin D supplementation, weight bearing exercises Bone density: N/A  Cervical cancer screening: 09/29/20 normal and negative HPV, same sexual partner   Skin cancer: Discussed monitoring for atypical lesions  Colorectal cancer: 10/25/20   Lung cancer:  Low Dose CT Chest recommended if Age 88-80 years, 20 pack-year currently smoking OR have quit w/in 15years. Patient does not qualify for screen   ECG: 08/02/16  Advanced Care Planning: A voluntary discussion about advance care planning including the explanation and discussion of advance directives.  Discussed health care proxy and Living will, and the patient was able to identify a health care proxy as Carolee Rota - daughter .  Patient does not have a living will and power of attorney of health care   Lipids: Lab Results  Component Value Date   CHOL 214 (H) 03/31/2021  CHOL 207 (H) 01/26/2020   CHOL 201 (H) 05/12/2019   Lab Results  Component Value Date   HDL 70 03/31/2021   HDL 69 01/26/2020   HDL 76 05/12/2019   Lab Results  Component Value Date   LDLCALC 130 (H) 03/31/2021   LDLCALC 123 (H) 01/26/2020   LDLCALC 109 (H) 05/12/2019   Lab Results  Component Value Date   TRIG 58 03/31/2021   TRIG 55 01/26/2020   TRIG 72 05/12/2019   Lab Results  Component Value Date   CHOLHDL 3.1 03/31/2021   CHOLHDL 3.0 01/26/2020   CHOLHDL 2.6 05/12/2019   No results found for: "LDLDIRECT"  Glucose: Glucose, Bld  Date Value Ref Range Status  03/31/2021 116 (H) 65 - 99 mg/dL Final    Comment:    .            Fasting reference interval . For someone without known diabetes, a glucose value between 100 and 125 mg/dL  is consistent with prediabetes and should be confirmed with a follow-up test. .   01/26/2020 127 (H) 65 - 99 mg/dL Final    Comment:    .            Fasting reference interval . For someone without known diabetes, a glucose value >125 mg/dL indicates that they may have diabetes and this should be confirmed with a follow-up test. .   05/12/2019 98 65 - 99 mg/dL Final    Comment:    .            Fasting reference interval .    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[place]) 07/10/2016   History of radioactive iodine thyroid ablation    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   Iron deficiency anemia 07/27/2015   B12 deficiency 07/27/2015   History of pulmonary embolus (PE) 07/25/2015   Pulmonary nodules/lesions, multiple 07/25/2015   OSA (obstructive sleep apnea) 05/18/2015   Thyroid nodule 12/17/2014   Gastroesophageal reflux disease without esophagitis 09/02/2014   Morbid obesity, unspecified obesity type ([place]) 06/22/2014   Large breasts 06/22/2014   H/O hyperthyroidism 06/22/2014   Right ventricular dilation 06/22/2014   Goiter, toxic, multinodular 05/22/2013   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    Past Surgical History:  Procedure Laterality Date   COLONOSCOPY WITH PROPOFOL N/A 10/25/2020   Procedure: COLONOSCOPY WITH PROPOFOL;  Surgeon: Jonathon Bellows, MD;  [facility];  Service: Gastroenterology;  Laterality: N/A;   LAPAROTOMY     for after childbirth    Family  History  Problem Relation Age of Onset   Hypertension Mother    CVA Father    Deep vein thrombosis Brother    Pulmonary embolism Brother    Anemia Sister     Social History   Socioeconomic History   Marital status: Married    Spouse name: Not on file   Number of children: 3   Years of education: Not on file   Highest education level: High school graduate  Occupational History   Not  on file  Tobacco Use   Smoking status: Never   Smokeless tobacco: Never  Vaping Use   Vaping Use: Never used  Substance and Sexual Activity   Alcohol use: No    Alcohol/week: 0.0 standard drinks of alcohol    Comment: rarely   Drug use: No   Sexual activity: Not Currently    Partners: Male    Birth control/protection: Condom  Other Topics Concern   Not on file  Social History Narrative   Lives with 3 with 3 daughters, husband was incarcerated April 2018 - for stealing, she has not been keeping in touch with him because she is disappointed. 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    Social Determinants of Health   Financial Resource Strain: Medium Risk (11/22/2021)   Overall Financial Resource Strain (CARDIA)    Difficulty of Paying Living Expenses: Somewhat hard  Food Insecurity: Food Insecurity Present (11/22/2021)   Hunger Vital Sign    Worried About Running Out of Food in the Last Year: Sometimes true    Ran Out of Food in the Last Year: Sometimes true  Transportation Needs: No Transportation Needs (11/22/2021)   PRAPARE - Hydrologist (Medical): No    Lack of Transportation (Non-Medical): No  Physical Activity: Insufficiently Active (11/22/2021)   Exercise Vital Sign    Days of Exercise per Week: 2 days    Minutes of Exercise per Session: 10 min  Stress: Stress Concern Present (11/22/2021)   [place]    Feeling of Stress : Very much  Social Connections: Moderately Integrated (11/22/2021)   Social Connection and Isolation Panel [NHANES]    Frequency of Communication with Friends and Family: More than three times a week    Frequency of Social Gatherings with Friends and Family: More than three times a week    Attends Religious Services: More than 4 times per year    Active Member of [employer] or Organizations: No    Attends Archivist Meetings: Never    Marital Status: Married  Human resources officer Violence: Not At Risk (11/22/2021)   Humiliation, Afraid, Rape, and Kick questionnaire    Fear of Current or Ex-Partner: No    Emotionally Abused: No    Physically Abused: No    Sexually Abused: No     Current Outpatient Medications:    amLODipine-valsartan (EXFORGE) 5-160 MG tablet, Take 1 tablet by mouth daily., Disp: 90 tablet, Rfl: 1   baclofen (LIORESAL) 10 MG tablet, Take 1 tablet (10 mg total) by mouth 3 (three) times daily as needed for muscle spasms., Disp: 30 tablet, Rfl: 0   Cholecalciferol (VITAMIN D) 50 MCG (2000 UT) CAPS, Take 1 capsule (2,000 Units total) by mouth daily., Disp: 30 capsule, Rfl: 0   levothyroxine (SYNTHROID) 50 MCG tablet, TAKE 1 TABLET BY MOUTH ONCE DAILY AND TWICE A WEEK TAKE TWO TABLETS, RECHECK IN 6 WEEK, Disp: 34 tablet, Rfl: 0   meloxicam (MOBIC) 15 MG tablet, Take 1 tablet (15 mg total) by mouth daily., Disp: 90 tablet, Rfl: 0   sertraline (ZOLOFT) 100 MG tablet, Take 1 tablet (100 mg total) by mouth daily. Total of 125 mg daily. Take along with 25 mg tab., Disp: 30 tablet, Rfl: 1   sertraline (ZOLOFT) 25 MG  tablet, Take 1 tablet (25 mg total) by mouth daily., Disp: 30 tablet, Rfl: 1   vitamin B-12 1000 MCG tablet, Take 1 tablet (1,000 mcg total) by mouth daily., Disp: 30 tablet, Rfl: 0  Allergies  Allergen Reactions   Aspirin Nausea And Vomiting   Hctz [Hydrochlorothiazide] Other (See  Comments)    Nausea, cramping   Tomato Itching and Swelling     ROS  Constitutional: Negative for fever or weight change.  Respiratory: Negative for cough , she has shortness of breath with moderate activity    Cardiovascular: Negative for chest pain or palpitations.  Gastrointestinal: Negative for abdominal pain, no bowel changes.  Musculoskeletal: Negative for gait problem or joint swelling.  Skin: Negative for rash.  she has dry skin  Neurological: Negative for dizziness or headache.  No other specific complaints in a complete review of systems (except as listed in HPI above).   Objective  Vitals:   11/22/21 0836  BP: 122/76  Pulse: 92  Resp: 16  SpO2: 99%  Weight: (!) 392 lb (177.8 kg)  Height: _0  (1.702 m)    Body mass index is 61.4 kg/m.  Physical Exam  Constitutional: Patient appears well-developed and well-nourished.Obesity  No distress.  HENT: Head: Normocephalic and atraumatic. Ears: B TMs ok, no erythema or effusion; Nose: Nose normal. Mouth/Throat: Oropharynx is clear and moist. No oropharyngeal exudate.  Eyes: Conjunctivae and EOM are normal. Pupils are equal, round, and reactive to light. No scleral icterus.  Neck: Normal range of motion. Neck supple. No JVD present. No thyromegaly present.  Cardiovascular: Normal rate, regular rhythm and normal heart sounds.  No murmur heard. No BLE edema. Pulmonary/Chest: Effort normal and breath sounds normal. No respiratory distress. Abdominal: Soft. Bowel sounds are normal, no distension. There is no tenderness. no masses Breast: no lumps or masses, no nipple discharge or rashes FEMALE GENITALIA:  Not done  RECTAL: not done  Musculoskeletal: Normal range of motion, no joint effusions. No gross deformities Neurological: he is alert and oriented to person, place, and time. No cranial nerve deficit. Coordination, balance, strength, speech and gait are normal.  Skin: Skin is warm and dry. No rash noted. No erythema.   Psychiatric: Patient has a normal mood and affect. behavior is normal. Judgment and thought content normal.   Recent Results (from the past 2160 hour(s))  TSH     Status: Abnormal   Collection Time: 09/05/21 10:12 AM  Result Value Ref Range   TSH 6.16 (H) mIU/L    Comment:           Reference Range .           > or = 20 Years  0.40-4.50 .                Pregnancy Ranges           First trimester    0.26-2.66           Second trimester   0.55-2.73           Third trimester    0.43-2.91      Fall Risk:    11/22/2021    8:35 AM 09/05/2021    9:15 AM 09/29/2020    9:28 AM 05/04/2020   11:43 AM 01/21/2020    9:01 AM  [place] in the past year? 0 0 0 0 0  Number falls in past yr: 0 0 0 0 0  Injury with Fall? 0 0 0  0 0  Risk for fall due to : No Fall Risks No Fall Risks No Fall Risks    Follow up Falls prevention discussed Falls prevention discussed Falls prevention discussed       Functional Status Survey: Is the patient deaf or have difficulty hearing?: Yes Does the patient have difficulty seeing, even when wearing glasses/contacts?: Yes Does the patient have difficulty concentrating, remembering, or making decisions?: No Does the patient have difficulty walking or climbing stairs?: No Does the patient have difficulty dressing or bathing?: No Does the patient have difficulty doing errands alone such as visiting a doctor's office or shopping?: No   Assessment & Plan  1. Well adult exam  - MM 3D SCREEN BREAST BILATERAL; Future - TSH - Lipid panel - COMPLETE METABOLIC PANEL WITH GFR - CBC with Differential/Platelet - Hemoglobin A1c - B12 and Folate Panel - VITAMIN D 25 Hydroxy (Vit-D Deficiency, Fractures)  2. Breast cancer screening by mammogram  - MM 3D SCREEN BREAST BILATERAL; Future  3. Vitamin D deficiency  - VITAMIN D 25 Hydroxy (Vit-D Deficiency, Fractures)  4. Hypertension, benign   5. B12 deficiency  - B12 and Folate Panel  6. Lipid  screening  - Lipid panel  7. Hypothyroidism, adult  - TSH  8. History of iron deficiency anemia  - CBC with Differential/Platelet  9. Diabetes mellitus screening  - Hemoglobin A1c  10. Needs flu shot  - Flu Vaccine QUAD 6+ mos PF IM (Fluarix Quad PF)  11. Need for pneumococcal 20-valent conjugate vaccination  - Pneumococcal conjugate vaccine 20-valent (Prevnar 20)    -USPSTF grade A and B recommendations reviewed with patient; age-appropriate recommendations, preventive care, screening tests, etc discussed and encouraged; healthy living encouraged; see AVS for patient education given to patient -Discussed importance of 150 minutes of physical activity weekly, eat two servings of fish weekly, eat one serving of tree nuts ( cashews, pistachios, pecans, almonds.Marland Kitchen) every other day, eat 6 servings of fruit/vegetables daily and drink plenty of water and avoid sweet beverages.   -Reviewed Health Maintenance: Yes.

## 2021-11-22 ENCOUNTER — Encounter: Payer: Self-pay | Admitting: Family Medicine

## 2021-11-22 ENCOUNTER — Ambulatory Visit (INDEPENDENT_AMBULATORY_CARE_PROVIDER_SITE_OTHER): Payer: Medicaid Other | Admitting: Family Medicine

## 2021-11-22 VITALS — BP 122/76 | HR 92 | Resp 16 | Ht 67.0 in | Wt 392.0 lb

## 2021-11-22 DIAGNOSIS — E559 Vitamin D deficiency, unspecified: Secondary | ICD-10-CM

## 2021-11-22 DIAGNOSIS — Z13220 Encounter for screening for lipoid disorders: Secondary | ICD-10-CM | POA: Diagnosis not present

## 2021-11-22 DIAGNOSIS — Z1231 Encounter for screening mammogram for malignant neoplasm of breast: Secondary | ICD-10-CM | POA: Diagnosis not present

## 2021-11-22 DIAGNOSIS — I10 Essential (primary) hypertension: Secondary | ICD-10-CM

## 2021-11-22 DIAGNOSIS — Z0000 Encounter for general adult medical examination without abnormal findings: Secondary | ICD-10-CM | POA: Diagnosis not present

## 2021-11-22 DIAGNOSIS — Z131 Encounter for screening for diabetes mellitus: Secondary | ICD-10-CM | POA: Diagnosis not present

## 2021-11-22 DIAGNOSIS — Z23 Encounter for immunization: Secondary | ICD-10-CM | POA: Diagnosis not present

## 2021-11-22 DIAGNOSIS — Z862 Personal history of diseases of the blood and blood-forming organs and certain disorders involving the immune mechanism: Secondary | ICD-10-CM

## 2021-11-22 DIAGNOSIS — E039 Hypothyroidism, unspecified: Secondary | ICD-10-CM

## 2021-11-22 DIAGNOSIS — E538 Deficiency of other specified B group vitamins: Secondary | ICD-10-CM | POA: Diagnosis not present

## 2021-11-23 LAB — CBC WITH DIFFERENTIAL/PLATELET
Absolute Monocytes: 436 cells/uL (ref 200–950)
Basophils Absolute: 33 cells/uL (ref 0–200)
Basophils Relative: 0.5 %
Eosinophils Absolute: 150 cells/uL (ref 15–500)
Eosinophils Relative: 2.3 %
HCT: 37.6 % (ref 35.0–45.0)
Hemoglobin: 12 g/dL (ref 11.7–15.5)
Lymphs Abs: 2704 cells/uL (ref 850–3900)
MCH: 30.5 pg (ref 27.0–33.0)
MCHC: 31.9 g/dL — ABNORMAL LOW (ref 32.0–36.0)
MCV: 95.7 fL (ref 80.0–100.0)
MPV: 10.8 fL (ref 7.5–12.5)
Monocytes Relative: 6.7 %
Neutro Abs: 3179 cells/uL (ref 1500–7800)
Neutrophils Relative %: 48.9 %
Platelets: 254 10*3/uL (ref 140–400)
RBC: 3.93 10*6/uL (ref 3.80–5.10)
RDW: 12.7 % (ref 11.0–15.0)
Total Lymphocyte: 41.6 %
WBC: 6.5 10*3/uL (ref 3.8–10.8)

## 2021-11-23 LAB — LIPID PANEL
Cholesterol: 212 mg/dL — ABNORMAL HIGH (ref <200)
HDL: 65 mg/dL (ref >=50)
LDL Cholesterol (Calc): 130 mg/dL (calc) — ABNORMAL HIGH
Non-HDL Cholesterol (Calc): 147 mg/dL (calc) — ABNORMAL HIGH (ref <130)
Total CHOL/HDL Ratio: 3.3 (calc) (ref <5.0)
Triglycerides: 80 mg/dL (ref <150)

## 2021-11-23 LAB — B12 AND FOLATE PANEL
Folate: 6.4 ng/mL
Vitamin B-12: 370 pg/mL (ref 200–1100)

## 2021-11-23 LAB — COMPLETE METABOLIC PANEL WITH GFR
AG Ratio: 1.1 (calc) (ref 1.0–2.5)
ALT: 8 U/L (ref 6–29)
AST: 10 U/L (ref 10–35)
Albumin: 3.7 g/dL (ref 3.6–5.1)
Alkaline phosphatase (APISO): 79 U/L (ref 37–153)
BUN: 12 mg/dL (ref 7–25)
CO2: 32 mmol/L (ref 20–32)
Calcium: 9.1 mg/dL (ref 8.6–10.4)
Chloride: 108 mmol/L (ref 98–110)
Creat: 0.69 mg/dL (ref 0.50–1.03)
Globulin: 3.5 g/dL (calc) (ref 1.9–3.7)
Glucose, Bld: 113 mg/dL — ABNORMAL HIGH (ref 65–99)
Potassium: 4.2 mmol/L (ref 3.5–5.3)
Sodium: 146 mmol/L (ref 135–146)
Total Bilirubin: 0.3 mg/dL (ref 0.2–1.2)
Total Protein: 7.2 g/dL (ref 6.1–8.1)
eGFR: 103 mL/min/{1.73_m2} (ref >=60)

## 2021-11-23 LAB — HEMOGLOBIN A1C
Hgb A1c MFr Bld: 5.3 % of total Hgb (ref <5.7)
Mean Plasma Glucose: 105 mg/dL
eAG (mmol/L): 5.8 mmol/L

## 2021-11-23 LAB — VITAMIN D 25 HYDROXY (VIT D DEFICIENCY, FRACTURES): Vit D, 25-Hydroxy: 19 ng/mL — ABNORMAL LOW (ref 30–100)

## 2021-11-23 LAB — TSH: TSH: 6.9 mIU/L — ABNORMAL HIGH

## 2021-11-27 ENCOUNTER — Other Ambulatory Visit: Payer: Self-pay | Admitting: Family Medicine

## 2021-11-27 DIAGNOSIS — E039 Hypothyroidism, unspecified: Secondary | ICD-10-CM

## 2021-11-27 MED ORDER — LEVOTHYROXINE SODIUM 75 MCG PO TABS: 75.0000 ug | ORAL_TABLET | Freq: Every day | ORAL | 0 refills | Status: DC

## 2021-11-28 ENCOUNTER — Other Ambulatory Visit: Payer: Self-pay

## 2021-11-28 DIAGNOSIS — E039 Hypothyroidism, unspecified: Secondary | ICD-10-CM

## 2021-12-17 ENCOUNTER — Telehealth: Payer: Medicaid Other | Admitting: Family

## 2021-12-17 DIAGNOSIS — R399 Unspecified symptoms and signs involving the genitourinary system: Secondary | ICD-10-CM

## 2021-12-17 DIAGNOSIS — M545 Low back pain, unspecified: Secondary | ICD-10-CM

## 2021-12-17 MED ORDER — CEPHALEXIN 500 MG PO CAPS: 500.0000 mg | ORAL_CAPSULE | Freq: Two times a day (BID) | ORAL | 0 refills | Status: DC

## 2021-12-17 NOTE — Addendum Note (Signed)
Addended by: Evelina Dun A on: 12/17/2021 02:17 PM   Modules accepted: Orders

## 2021-12-17 NOTE — Progress Notes (Signed)

## 2021-12-17 NOTE — Progress Notes (Signed)
Because you are having UTI symptoms and back pain, I feel your condition warrants further evaluation and I recommend that you be seen in a face to face visit.   NOTE: There will be NO CHARGE for this eVisit   If you are having a true medical emergency please call 911.      For an urgent face to face visit, [facility] has seven urgent care centers for your convenience:     [facility] [place] at  Get Driving Directions [phone] [place], [place] [address]    [place] [facility] [place] [facility]) Get Driving Directions [phone] [place], [place] [address]  [place] [facility] [place] [facility]) Get Driving Directions [phone] [address] [place],  [place]  [address]  [place] [facility] - at [place] Get Driving Directions  [phone] [address] [place],  [place] [address]   [facility]  Get Driving Directions [phone] [address] [place], [place], [place] [address]   [facility] [place] Get Driving Directions  [phone] [address] [place], [place] [address]   [facility] [place] Get Driving Directions [phone] [address] [place], [place] [address]  Your MyChart E-visit questionnaire answers were reviewed by a board certified advanced clinical practitioner to complete your personal care plan based on your specific symptoms.  Thank you for using e-Visits.

## 2022-01-07 ENCOUNTER — Other Ambulatory Visit: Payer: Self-pay | Admitting: Psychiatry

## 2022-01-07 DIAGNOSIS — F321 Major depressive disorder, single episode, moderate: Secondary | ICD-10-CM

## 2022-02-13 NOTE — Progress Notes (Deleted)
Name: Madeline Dominguez   MRN: [id number]    DOB: 1965/05/27   Date:02/13/2022       Progress Note  Subjective  Chief Complaint  Follow Up  HPI  Cardiomyopathy: she takes Exforge daily.  Only symptom is SOB with moderate activity , denies orthopnea or wheezing, occasionally has lower extremity edema. She has OSA but not compliant with CPAP, discussed importance of compliance   The 10-year ASCVD risk score (Arnett DK, et al., 2019) is: 4.2%   Values used to calculate the score:     Age: 57 years     Sex: Female     Is Non-Hispanic African American: Yes     Diabetic: No     Tobacco smoker: No     Systolic Blood Pressure: [id number] mmHg     Is BP treated: Yes     HDL Cholesterol: 70 mg/dL     Total Cholesterol: 214 mg/dL    HTN: bp today is at goal, no chest pain or palpitation  used to see nephrologist but released from their care.  She takes Exforge . She has noticed episodes of angioedema (after she eats tomatoes and used to have that as a child) , she stopped ACE and tomatoes and no angioedema since Dec 2020 .   Depression: severe episode in 2018 with anhedonia, lack of appetite, she had pain all over, unable to work for months. She is still seeing psychiatrist and taking zoloft 125 mg daily , she has a job now and seems to be feeling better  Adrenal nodule left/Goiter : no symptoms, many tests done, she is going to [facility]  last visit 09/ it was considered non functional adenoma. She also had a goiter, status post iodine ablation , she is taking levothyroxine 50 mcg daily and twice a week half pill    Morbid obesity: discussed life style modification again, weight is trending, cannot be active due to knee pain. Discussed water aerobic, she has a pool at home but too many steps to go in and out, advised a ramp   GERD: she states off medications and no longer having problems Unchanged    OA: she is now taking Aleve daily, we will give her meloxicam to take every other day and try tylenol  on days off Meloxicam,  she daily pain aching pain on both knees, left worse than right side,  she states it feels  stiff,usually worse when she first starts to move  It affects her gait and sometimes has effusion, seen by Ortho but too young for surgery    Patient Active Problem List   Diagnosis Date Noted   Dilated cardiomyopathy ([place]) 05/12/2019   Stress incontinence in female 03/25/2017   History of deep venous thrombosis (DVT) of distal vein of right lower extremity 01/18/2017   Adrenal adenoma, left 09/11/2016   Current moderate episode of major depressive disorder without prior episode ([place]) 08/10/2016   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[place]) 07/10/2016   History of radioactive iodine thyroid ablation    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   Iron deficiency anemia 07/27/2015   B12 deficiency 07/27/2015   History of pulmonary embolus (PE) 07/25/2015   Pulmonary nodules/lesions, multiple 07/25/2015   OSA (obstructive sleep apnea) 05/18/2015   Thyroid nodule 12/17/2014   Gastroesophageal reflux disease without esophagitis 09/02/2014   Morbid obesity, unspecified obesity type ([place]) 06/22/2014   Large breasts 06/22/2014   H/O hyperthyroidism 06/22/2014   Right ventricular dilation 06/22/2014  Goiter, toxic, multinodular 05/22/2013   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    Past Surgical History:  Procedure Laterality Date   COLONOSCOPY WITH PROPOFOL N/A 10/25/2020   Procedure: COLONOSCOPY WITH PROPOFOL;  Surgeon: Jonathon Bellows, MD;  Location: [facility] ENDOSCOPY;  Service: Gastroenterology;  Laterality: N/A;   LAPAROTOMY     for after childbirth    Family History  Problem Relation Age of Onset   Hypertension Mother    CVA Father    Deep vein thrombosis Brother    Pulmonary embolism Brother    Anemia Sister     Social History   Tobacco Use   Smoking status: Never   Smokeless tobacco: Never  Substance Use Topics   Alcohol use:  No    Alcohol/week: 0.0 standard drinks of alcohol    Comment: rarely     Current Outpatient Medications:    amLODipine-valsartan (EXFORGE) 5-160 MG tablet, Take 1 tablet by mouth daily., Disp: 90 tablet, Rfl: 1   baclofen (LIORESAL) 10 MG tablet, Take 1 tablet (10 mg total) by mouth 3 (three) times daily as needed for muscle spasms., Disp: 30 tablet, Rfl: 0   cephALEXin (KEFLEX) 500 MG capsule, Take 1 capsule (500 mg total) by mouth 2 (two) times daily., Disp: 14 capsule, Rfl: 0   Cholecalciferol (VITAMIN D) 50 MCG (2000 UT) CAPS, Take 1 capsule (2,000 Units total) by mouth daily., Disp: 30 capsule, Rfl: 0   levothyroxine (SYNTHROID) 75 MCG tablet, Take 1 tablet (75 mcg total) by mouth daily before breakfast., Disp: 90 tablet, Rfl: 0   meloxicam (MOBIC) 15 MG tablet, Take 1 tablet (15 mg total) by mouth daily., Disp: 90 tablet, Rfl: 0   sertraline (ZOLOFT) 100 MG tablet, Take 1 tablet (100 mg total) by mouth daily. Total of 125 mg daily. Take along with 25 mg tab., Disp: 30 tablet, Rfl: 1   sertraline (ZOLOFT) 25 MG tablet, Take 1 tablet (25 mg total) by mouth daily., Disp: 30 tablet, Rfl: 1   vitamin B-12 1000 MCG tablet, Take 1 tablet (1,000 mcg total) by mouth daily., Disp: 30 tablet, Rfl: 0  Allergies  Allergen Reactions   Aspirin Nausea And Vomiting   Hctz [Hydrochlorothiazide] Other (See Comments)    Nausea, cramping   Tomato Itching and Swelling    I personally reviewed active problem list, medication list, allergies, family history, social history, health maintenance with the patient/caregiver today.   ROS  ***  Objective  There were no vitals filed for this visit.  There is no height or weight on file to calculate BMI.  Physical Exam ***   PHQ2/9:    11/22/2021    8:35 AM 09/05/2021    9:15 AM 03/24/2021    9:44 AM 01/11/2021   11:26 AM 09/29/2020    9:28 AM  Depression screen PHQ 2/9  Decreased Interest 1 1  0 3  Down, Depressed, Hopeless 1 1  0 3  PHQ - 2  Score 2 2  0 6  Altered sleeping 3 3  3 2  $ Tired, decreased energy 0 0  1 2  Change in appetite 0 0  1 0  Feeling bad or failure about yourself  0 0  1 0  Trouble concentrating 3 0  0 1  Moving slowly or fidgety/restless 0 0  0 0  Suicidal thoughts 0 0  0 0  PHQ-9 Score 8 5  6 11  $ Difficult doing work/chores    Somewhat difficult  Information is confidential and restricted. Go to Review Flowsheets to unlock data.    phq 9 is {gen pos [id number]}   Fall Risk:    11/22/2021    8:35 AM 09/05/2021    9:15 AM 09/29/2020    9:28 AM 05/04/2020   11:43 AM 01/21/2020    9:01 AM  Fall Risk   Falls in the past year? 0 0 0 0 0  Number falls in past yr: 0 0 0 0 0  Injury with Fall? 0 0 0 0 0  Risk for fall due to : No Fall Risks No Fall Risks No Fall Risks    Follow up Falls prevention discussed Falls prevention discussed Falls prevention discussed        Functional Status Survey:      Assessment & Plan  *** There are no diagnoses linked to this encounter.

## 2022-02-14 ENCOUNTER — Ambulatory Visit: Payer: Medicaid Other | Admitting: Family Medicine

## 2022-02-17 ENCOUNTER — Encounter: Payer: Self-pay | Admitting: Family Medicine

## 2022-02-19 ENCOUNTER — Other Ambulatory Visit: Payer: Self-pay

## 2022-02-19 ENCOUNTER — Encounter: Payer: Self-pay | Admitting: Family Medicine

## 2022-02-19 ENCOUNTER — Telehealth (INDEPENDENT_AMBULATORY_CARE_PROVIDER_SITE_OTHER): Payer: Medicaid Other | Admitting: Family Medicine

## 2022-02-19 DIAGNOSIS — F321 Major depressive disorder, single episode, moderate: Secondary | ICD-10-CM | POA: Diagnosis not present

## 2022-02-19 MED ORDER — SERTRALINE HCL 100 MG PO TABS: 100.0000 mg | ORAL_TABLET | Freq: Every day | ORAL | 1 refills | Status: DC

## 2022-02-19 MED ORDER — SERTRALINE HCL 25 MG PO TABS: 25.0000 mg | ORAL_TABLET | Freq: Every day | ORAL | 1 refills | Status: DC

## 2022-02-19 NOTE — Telephone Encounter (Signed)
Replied to pts MyChart message to schedule an appt

## 2022-02-19 NOTE — Progress Notes (Signed)
Virtual Visit via Video Note  I connected with Madeline Dominguez on 02/19/22 at  2:00 PM EST by a video enabled telemedicine application and verified that I am speaking with the correct person using two identifiers.  Location: Patient: home Provider: [facility]   I discussed the limitations of evaluation and management by telemedicine and the availability of in person appointments. The patient expressed understanding and agreed to proceed.  History of Present Illness:  Depression - Medications: sertraline 148m - previously followed with Dr. HModesta Messing Psych. Looking for different psych. Referred to [facility] 10/2021, hasn't scheduled yet. - Taking: ran out of sertraline few days ago, starting to notice worsening anxiety symptoms.  - Counseling: not currently.  - Symptoms: anxiousness, jitteriness      02/19/2022    1:49 PM 11/22/2021    8:35 AM 09/05/2021    9:15 AM  Depression screen PHQ 2/9  Decreased Interest 1 1 1  $ Down, Depressed, Hopeless 1 1 1  $ PHQ - 2 Score 2 2 2  $ Altered sleeping 1 3 3  $ Tired, decreased energy 0 0 0  Change in appetite 0 0 0  Feeling bad or failure about yourself  0 0 0  Trouble concentrating 2 3 0  Moving slowly or fidgety/restless 0 0 0  Suicidal thoughts 0 0 0  PHQ-9 Score 5 8 5  $ Difficult doing work/chores Somewhat difficult        02/19/2022    1:50 PM 01/21/2020    9:04 AM 07/22/2017    9:40 AM  GAD 7 : Generalized Anxiety Score  Nervous, Anxious, on Edge 1 0 1  Control/stop worrying 1 0 1  Worry too much - different things 1 0 0  Trouble relaxing 1 0 0  Restless 1 0 0  Easily annoyed or irritable 0 2 1  Afraid - awful might happen 0 1 0  Total GAD 7 Score 5 3 3  $ Anxiety Difficulty Somewhat difficult Not difficult at all Not difficult at all     Observations/Objective:  Well appearing, in NAD. Speaks in full sentences. Comfortable WOB on RA. No resp distress.   Assessment and Plan:  Current moderate episode of major depressive  disorder without prior episode ([place] Previously well controlled on sertraline, starting to notice worsening since running out. Given <1 week since discontinuation, able to restart at maintenance dose. Counseled on potential side effects with restarting. Given phone number to schedule with new Psych. Has PCP f/u in few weeks for recheck.     I discussed the assessment and treatment plan with the patient. The patient was provided an opportunity to ask questions and all were answered. The patient agreed with the plan and demonstrated an understanding of the instructions.   The patient was advised to call back or seek an in-person evaluation if the symptoms worsen or if the condition fails to improve as anticipated.  I provided 7 minutes of non-face-to-face time during this encounter.   AMyles Gip DO

## 2022-02-19 NOTE — Assessment & Plan Note (Signed)
Previously well controlled on sertraline, starting to notice worsening since running out. Given <1 week since discontinuation, able to restart at maintenance dose. Counseled on potential side effects with restarting. Given phone number to schedule with new Psych. Has PCP f/u in few weeks for recheck.

## 2022-02-19 NOTE — Patient Instructions (Signed)
It was great to see you!  Our plans for today:  - Call [facility] at [phone] to schedule appointment. - Restart your sertraline. Let us know if you have any issues with side effects. - Come back in a few weeks at your scheduled appointment.   Take care and seek immediate care sooner if you develop any concerns.   Dr. Ky Barban

## 2022-03-06 NOTE — Progress Notes (Deleted)
Name: Madeline Dominguez   MRN: [id number]    DOB: 31-Jan-1965   Date:03/06/2022       Progress Note  Subjective  Chief Complaint  Follow Up  HPI  Cardiomyopathy: she takes Exforge daily.  Only symptom is SOB with moderate activity , denies orthopnea or wheezing, occasionally has lower extremity edema. She has OSA but not compliant with CPAP, discussed importance of compliance   The 10-year ASCVD risk score (Arnett DK, et al., 2019) is: 4.2%   Values used to calculate the score:     Age: 57 years     Sex: Female     Is Non-Hispanic African American: Yes     Diabetic: No     Tobacco smoker: No     Systolic Blood Pressure: Q000111Q mmHg     Is BP treated: Yes     HDL Cholesterol: 70 mg/dL     Total Cholesterol: 214 mg/dL    HTN: bp today is at goal, no chest pain or palpitation  used to see nephrologist but released from their care.  She takes Exforge . She has noticed episodes of angioedema (after she eats tomatoes and used to have that as a child) , she stopped ACE and tomatoes and no angioedema since Dec 2020 .   Depression: severe episode in 2018 with anhedonia, lack of appetite, she had pain all over, unable to work for months. She is still seeing psychiatrist and taking zoloft 125 mg daily , she has a job now and seems to be feeling better  Adrenal nodule left/Goiter : no symptoms, many tests done, she is going to [facility]  last visit 09/ it was considered non functional adenoma. She also had a goiter, status post iodine ablation , she is taking levothyroxine 50 mcg daily and twice a week half pill    Morbid obesity: discussed life style modification again, weight is trending, cannot be active due to knee pain. Discussed water aerobic, she has a pool at home but too many steps to go in and out, advised a ramp   GERD: she states off medications and no longer having problems Unchanged    OA: she is now taking Aleve daily, we will give her meloxicam to take every other day and try tylenol  on days off Meloxicam,  she daily pain aching pain on both knees, left worse than right side,  she states it feels  stiff,usually worse when she first starts to move  It affects her gait and sometimes has effusion, seen by Ortho but too young for surgery    Patient Active Problem List   Diagnosis Date Noted   Dilated cardiomyopathy ([place]) 05/12/2019   Stress incontinence in female 03/25/2017   History of deep venous thrombosis (DVT) of distal vein of right lower extremity 01/18/2017   Adrenal adenoma, left 09/11/2016   Current moderate episode of major depressive disorder without prior episode ([place]) 08/10/2016   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[place]) 07/10/2016   History of radioactive iodine thyroid ablation    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   Iron deficiency anemia 07/27/2015   B12 deficiency 07/27/2015   History of pulmonary embolus (PE) 07/25/2015   Pulmonary nodules/lesions, multiple 07/25/2015   OSA (obstructive sleep apnea) 05/18/2015   Thyroid nodule 12/17/2014   Gastroesophageal reflux disease without esophagitis 09/02/2014   Morbid obesity, unspecified obesity type ([place]) 06/22/2014   Large breasts 06/22/2014   H/O hyperthyroidism 06/22/2014   Right ventricular dilation 06/22/2014  Goiter, toxic, multinodular 05/22/2013   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    Past Surgical History:  Procedure Laterality Date   COLONOSCOPY WITH PROPOFOL N/A 10/25/2020   Procedure: COLONOSCOPY WITH PROPOFOL;  Surgeon: Jonathon Bellows, MD;  Location: [facility] ENDOSCOPY;  Service: Gastroenterology;  Laterality: N/A;   LAPAROTOMY     for after childbirth    Family History  Problem Relation Age of Onset   Hypertension Mother    CVA Father    Deep vein thrombosis Brother    Pulmonary embolism Brother    Anemia Sister     Social History   Tobacco Use   Smoking status: Never   Smokeless tobacco: Never  Substance Use Topics   Alcohol use:  No    Alcohol/week: 0.0 standard drinks of alcohol    Comment: rarely     Current Outpatient Medications:    amLODipine-valsartan (EXFORGE) 5-160 MG tablet, Take 1 tablet by mouth daily., Disp: 90 tablet, Rfl: 1   baclofen (LIORESAL) 10 MG tablet, Take 1 tablet (10 mg total) by mouth 3 (three) times daily as needed for muscle spasms., Disp: 30 tablet, Rfl: 0   Cholecalciferol (VITAMIN D) 50 MCG (2000 UT) CAPS, Take 1 capsule (2,000 Units total) by mouth daily., Disp: 30 capsule, Rfl: 0   levothyroxine (SYNTHROID) 75 MCG tablet, Take 1 tablet (75 mcg total) by mouth daily before breakfast., Disp: 90 tablet, Rfl: 0   meloxicam (MOBIC) 15 MG tablet, Take 1 tablet (15 mg total) by mouth daily., Disp: 90 tablet, Rfl: 0   sertraline (ZOLOFT) 100 MG tablet, Take 1 tablet (100 mg total) by mouth daily. Total of 125 mg daily. Take along with 25 mg tab., Disp: 90 tablet, Rfl: 1   sertraline (ZOLOFT) 25 MG tablet, Take 1 tablet (25 mg total) by mouth daily. Take with '100mg'$  tablet., Disp: 90 tablet, Rfl: 1   vitamin B-12 1000 MCG tablet, Take 1 tablet (1,000 mcg total) by mouth daily., Disp: 30 tablet, Rfl: 0  Allergies  Allergen Reactions   Aspirin Nausea And Vomiting   Hctz [Hydrochlorothiazide] Other (See Comments)    Nausea, cramping   Tomato Itching and Swelling    I personally reviewed active problem list, medication list, allergies, family history, social history, health maintenance with the patient/caregiver today.   ROS  ***  Objective  There were no vitals filed for this visit.  There is no height or weight on file to calculate BMI.  Physical Exam ***  No results found for this or any previous visit (from the past 2160 hour(s)).   PHQ2/9:    02/19/2022    1:49 PM 11/22/2021    8:35 AM 09/05/2021    9:15 AM 03/24/2021    9:44 AM 01/11/2021   11:26 AM  Depression screen PHQ 2/9  Decreased Interest '1 1 1  '$ 0  Down, Depressed, Hopeless '1 1 1  '$ 0  PHQ - 2 Score '2 2 2  '$ 0   Altered sleeping '1 3 3  3  '$ Tired, decreased energy 0 0 0  1  Change in appetite 0 0 0  1  Feeling bad or failure about yourself  0 0 0  1  Trouble concentrating 2 3 0  0  Moving slowly or fidgety/restless 0 0 0  0  Suicidal thoughts 0 0 0  0  PHQ-9 Score '5 8 5  6  '$ Difficult doing work/chores Somewhat difficult    Somewhat difficult     Information is confidential  and restricted. Go to Review Flowsheets to unlock data.    phq 9 is {gen pos [id number]   Fall Risk:    02/19/2022    1:49 PM 11/22/2021    8:35 AM 09/05/2021    9:15 AM 09/29/2020    9:28 AM 05/04/2020   11:43 AM  Fall Risk   Falls in the past year? 0 0 0 0 0  Number falls in past yr: 0 0 0 0 0  Injury with Fall? 0 0 0 0 0  Risk for fall due to : No Fall Risks No Fall Risks No Fall Risks No Fall Risks   Follow up Falls prevention discussed;Education provided;Falls evaluation completed Falls prevention discussed Falls prevention discussed Falls prevention discussed       Functional Status Survey:      Assessment & Plan  *** There are no diagnoses linked to this encounter.

## 2022-03-07 ENCOUNTER — Telehealth: Payer: Medicaid Other | Admitting: Physician Assistant

## 2022-03-07 ENCOUNTER — Ambulatory Visit: Payer: Medicaid Other | Admitting: Family Medicine

## 2022-03-07 DIAGNOSIS — R3989 Other symptoms and signs involving the genitourinary system: Secondary | ICD-10-CM | POA: Diagnosis not present

## 2022-03-07 MED ORDER — CEPHALEXIN 500 MG PO CAPS: 500.0000 mg | ORAL_CAPSULE | Freq: Two times a day (BID) | ORAL | 0 refills | Status: DC

## 2022-03-07 NOTE — Progress Notes (Signed)

## 2022-03-10 ENCOUNTER — Other Ambulatory Visit: Payer: Self-pay | Admitting: Family Medicine

## 2022-03-10 DIAGNOSIS — E039 Hypothyroidism, unspecified: Secondary | ICD-10-CM

## 2022-03-11 ENCOUNTER — Other Ambulatory Visit: Payer: Self-pay | Admitting: Family Medicine

## 2022-03-11 DIAGNOSIS — I10 Essential (primary) hypertension: Secondary | ICD-10-CM

## 2022-03-11 DIAGNOSIS — E039 Hypothyroidism, unspecified: Secondary | ICD-10-CM

## 2022-03-12 NOTE — Telephone Encounter (Signed)
She has one scheduled for 03-27-2022. Is that not ok or needs sooner. Please advise

## 2022-03-13 ENCOUNTER — Other Ambulatory Visit: Payer: Self-pay | Admitting: Family Medicine

## 2022-03-13 DIAGNOSIS — E039 Hypothyroidism, unspecified: Secondary | ICD-10-CM

## 2022-03-15 ENCOUNTER — Other Ambulatory Visit: Payer: Self-pay | Admitting: Family Medicine

## 2022-03-15 DIAGNOSIS — I10 Essential (primary) hypertension: Secondary | ICD-10-CM

## 2022-03-15 NOTE — Telephone Encounter (Signed)
Copied from [place] [id number]. Topic: General - Other >> Mar 15, 2022 11:59 AM Denman George T wrote: Reason for CRM: Patient called stated she has been out of her levothyroxine (SYNTHROID) 75 MCG for a couple of days and need to see if provider can give her enough to last her until her next appt on 3/26.

## 2022-03-16 DIAGNOSIS — E039 Hypothyroidism, unspecified: Secondary | ICD-10-CM | POA: Diagnosis not present

## 2022-03-17 LAB — TSH: TSH: 3.7 mIU/L

## 2022-03-19 ENCOUNTER — Other Ambulatory Visit: Payer: Self-pay | Admitting: Family Medicine

## 2022-03-19 DIAGNOSIS — I10 Essential (primary) hypertension: Secondary | ICD-10-CM

## 2022-03-19 MED ORDER — LEVOTHYROXINE SODIUM 75 MCG PO TABS: 75.0000 ug | ORAL_TABLET | Freq: Every day | ORAL | 0 refills | Status: DC

## 2022-03-19 NOTE — Telephone Encounter (Signed)
Pt is calling back stating that she had a visit with her PCP on Friday 03/16/22 and states that her medication still have not been refilled at her pharmacy.  Pt next appt with PCP is 03/27/22.  [facility] [place]), [place] - [address] Phone: [phone]  Fax: [phone]       Please advise.

## 2022-03-19 NOTE — Telephone Encounter (Signed)
Pt has an appt on 03/27/22

## 2022-03-19 NOTE — Telephone Encounter (Signed)
Pt is calling back stating that she had a visit with her PCP on Friday 03/16/22. Pt states that her medication still has not been refilled at her pharmacy.   Pt next appt is showing for 03/27/22.  [facility] [place]), [place] - [address] Phone: [phone]  Fax: [phone]       Please advise

## 2022-03-26 NOTE — Progress Notes (Deleted)
Name: Madeline Dominguez   MRN: [id number]    DOB: 11/15/1965   Date:03/26/2022       Progress Note  Subjective  Chief Complaint  Follow Up  HPI  Cardiomyopathy: she takes Exforge daily.  Only symptom is SOB with moderate activity , denies orthopnea or wheezing, occasionally has lower extremity edema. She has OSA but not compliant with CPAP, discussed importance of compliance   The 10-year ASCVD risk score (Arnett DK, et al., 2019) is: 4.2%   Values used to calculate the score:     Age: 57 years     Sex: Female     Is Non-Hispanic African American: Yes     Diabetic: No     Tobacco smoker: No     Systolic Blood Pressure: Q000111Q mmHg     Is BP treated: Yes     HDL Cholesterol: 70 mg/dL     Total Cholesterol: 214 mg/dL    HTN: bp today is at goal, no chest pain or palpitation  used to see nephrologist but released from their care.  She takes Exforge . She has noticed episodes of angioedema (after she eats tomatoes and used to have that as a child) , she stopped ACE and tomatoes and no angioedema since Dec 2020 .   Depression: severe episode in 2018 with anhedonia, lack of appetite, she had pain all over, unable to work for months. She is still seeing psychiatrist and taking zoloft 125 mg daily , she has a job now and seems to be feeling better  Adrenal nodule left/Goiter : no symptoms, many tests done, she is going to [facility]  last visit 09/ it was considered non functional adenoma. She also had a goiter, status post iodine ablation , she is taking levothyroxine 50 mcg daily and twice a week half pill    Morbid obesity: discussed life style modification again, weight is trending, cannot be active due to knee pain. Discussed water aerobic, she has a pool at home but too many steps to go in and out, advised a ramp   GERD: she states off medications and no longer having problems Unchanged    OA: she is now taking Aleve daily, we will give her meloxicam to take every other day and try tylenol  on days off Meloxicam,  she daily pain aching pain on both knees, left worse than right side,  she states it feels  stiff,usually worse when she first starts to move  It affects her gait and sometimes has effusion, seen by Ortho but too young for surgery    Patient Active Problem List   Diagnosis Date Noted   Dilated cardiomyopathy ([place]) 05/12/2019   Stress incontinence in female 03/25/2017   History of deep venous thrombosis (DVT) of distal vein of right lower extremity 01/18/2017   Adrenal adenoma, left 09/11/2016   Current moderate episode of major depressive disorder without prior episode ([place]) 08/10/2016   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   History of radioactive iodine thyroid ablation    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   Iron deficiency anemia 07/27/2015   B12 deficiency 07/27/2015   History of pulmonary embolus (PE) 07/25/2015   Pulmonary nodules/lesions, multiple 07/25/2015   OSA (obstructive sleep apnea) 05/18/2015   Thyroid nodule 12/17/2014   Gastroesophageal reflux disease without esophagitis 09/02/2014   Morbid obesity, unspecified obesity type ([place]) 06/22/2014   Large breasts 06/22/2014   H/O hyperthyroidism 06/22/2014   Right ventricular dilation 06/22/2014  Goiter, toxic, multinodular 05/22/2013   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    Past Surgical History:  Procedure Laterality Date   COLONOSCOPY WITH PROPOFOL N/A 10/25/2020   Procedure: COLONOSCOPY WITH PROPOFOL;  Surgeon: Jonathon Bellows, MD;  Location: [facility] ENDOSCOPY;  Service: Gastroenterology;  Laterality: N/A;   LAPAROTOMY     for after childbirth    Family History  Problem Relation Age of Onset   Hypertension Mother    CVA Father    Deep vein thrombosis Brother    Pulmonary embolism Brother    Anemia Sister     Social History   Tobacco Use   Smoking status: Never   Smokeless tobacco: Never  Substance Use Topics   Alcohol use:  No    Alcohol/week: 0.0 standard drinks of alcohol    Comment: rarely     Current Outpatient Medications:    amLODipine-valsartan (EXFORGE) 5-160 MG tablet, Take 1 tablet by mouth once daily, Disp: 30 tablet, Rfl: 0   baclofen (LIORESAL) 10 MG tablet, Take 1 tablet (10 mg total) by mouth 3 (three) times daily as needed for muscle spasms., Disp: 30 tablet, Rfl: 0   cephALEXin (KEFLEX) 500 MG capsule, Take 1 capsule (500 mg total) by mouth 2 (two) times daily., Disp: 14 capsule, Rfl: 0   Cholecalciferol (VITAMIN D) 50 MCG (2000 UT) CAPS, Take 1 capsule (2,000 Units total) by mouth daily., Disp: 30 capsule, Rfl: 0   levothyroxine (SYNTHROID) 75 MCG tablet, Take 1 tablet (75 mcg total) by mouth daily before breakfast., Disp: 30 tablet, Rfl: 0   meloxicam (MOBIC) 15 MG tablet, Take 1 tablet (15 mg total) by mouth daily., Disp: 90 tablet, Rfl: 0   sertraline (ZOLOFT) 100 MG tablet, Take 1 tablet (100 mg total) by mouth daily. Total of 125 mg daily. Take along with 25 mg tab., Disp: 90 tablet, Rfl: 1   sertraline (ZOLOFT) 25 MG tablet, Take 1 tablet (25 mg total) by mouth daily. Take with 100mg  tablet., Disp: 90 tablet, Rfl: 1   vitamin B-12 1000 MCG tablet, Take 1 tablet (1,000 mcg total) by mouth daily., Disp: 30 tablet, Rfl: 0  Allergies  Allergen Reactions   Aspirin Nausea And Vomiting   Hctz [Hydrochlorothiazide] Other (See Comments)    Nausea, cramping   Tomato Itching and Swelling    I personally reviewed active problem list, medication list, allergies, family history, social history, health maintenance with the patient/caregiver today.   ROS  ***  Objective  There were no vitals filed for this visit.  There is no height or weight on file to calculate BMI.  Physical Exam ***  Recent Results (from the past 2160 hour(s))  TSH     Status: None   Collection Time: 03/16/22  3:29 PM  Result Value Ref Range   TSH 3.70 mIU/L    Comment:           Reference Range .           >  or = 20 Years  0.40-4.50 .                Pregnancy Ranges           First trimester    0.26-2.66           Second trimester   0.55-2.73           Third trimester    0.43-2.91     PHQ2/9:    02/19/2022    1:49 PM 11/22/2021  8:35 AM 09/05/2021    9:15 AM 03/24/2021    9:44 AM 01/11/2021   11:26 AM  Depression screen PHQ 2/9  Decreased Interest 1 1 1   0  Down, Depressed, Hopeless 1 1 1   0  PHQ - 2 Score 2 2 2   0  Altered sleeping 1 3 3  3   Tired, decreased energy 0 0 0  1  Change in appetite 0 0 0  1  Feeling bad or failure about yourself  0 0 0  1  Trouble concentrating 2 3 0  0  Moving slowly or fidgety/restless 0 0 0  0  Suicidal thoughts 0 0 0  0  PHQ-9 Score 5 8 5  6   Difficult doing work/chores Somewhat difficult    Somewhat difficult     Information is confidential and restricted. Go to Review Flowsheets to unlock data.    phq 9 is {gen pos [id number]   Fall Risk:    02/19/2022    1:49 PM 11/22/2021    8:35 AM 09/05/2021    9:15 AM 09/29/2020    9:28 AM 05/04/2020   11:43 AM  Fall Risk   Falls in the past year? 0 0 0 0 0  Number falls in past yr: 0 0 0 0 0  Injury with Fall? 0 0 0 0 0  Risk for fall due to : No Fall Risks No Fall Risks No Fall Risks No Fall Risks   Follow up Falls prevention discussed;Education provided;Falls evaluation completed Falls prevention discussed Falls prevention discussed Falls prevention discussed       Functional Status Survey:      Assessment & Plan  *** There are no diagnoses linked to this encounter.

## 2022-03-27 ENCOUNTER — Encounter: Payer: Self-pay | Admitting: Family Medicine

## 2022-03-27 ENCOUNTER — Ambulatory Visit: Payer: Medicaid Other | Admitting: Family Medicine

## 2022-04-14 ENCOUNTER — Other Ambulatory Visit: Payer: Self-pay | Admitting: Family Medicine

## 2022-04-14 DIAGNOSIS — E039 Hypothyroidism, unspecified: Secondary | ICD-10-CM

## 2022-04-16 ENCOUNTER — Other Ambulatory Visit: Payer: Self-pay

## 2022-04-16 DIAGNOSIS — I10 Essential (primary) hypertension: Secondary | ICD-10-CM

## 2022-04-16 DIAGNOSIS — E039 Hypothyroidism, unspecified: Secondary | ICD-10-CM

## 2022-04-16 MED ORDER — AMLODIPINE BESYLATE-VALSARTAN 5-160 MG PO TABS: 1.0000 | ORAL_TABLET | Freq: Every day | ORAL | 0 refills | Status: DC

## 2022-04-16 MED ORDER — LEVOTHYROXINE SODIUM 75 MCG PO TABS: 75.0000 ug | ORAL_TABLET | Freq: Every day | ORAL | 0 refills | Status: DC

## 2022-04-16 NOTE — Telephone Encounter (Signed)
Pt has an appt on 05/18/22 and also needs a refill on her amlodipine

## 2022-04-16 NOTE — Telephone Encounter (Signed)
Sent one month refill per Dr Carlynn Purl

## 2022-04-20 ENCOUNTER — Ambulatory Visit (INDEPENDENT_AMBULATORY_CARE_PROVIDER_SITE_OTHER): Payer: Medicaid Other | Admitting: Family Medicine

## 2022-04-20 ENCOUNTER — Encounter: Payer: Self-pay | Admitting: Family Medicine

## 2022-04-20 VITALS — BP 132/86 | HR 93 | Resp 16 | Ht 67.0 in | Wt 376.0 lb

## 2022-04-20 DIAGNOSIS — F339 Major depressive disorder, recurrent, unspecified: Secondary | ICD-10-CM

## 2022-04-20 DIAGNOSIS — M170 Bilateral primary osteoarthritis of knee: Secondary | ICD-10-CM | POA: Diagnosis not present

## 2022-04-20 DIAGNOSIS — E039 Hypothyroidism, unspecified: Secondary | ICD-10-CM | POA: Diagnosis not present

## 2022-04-20 DIAGNOSIS — I422 Other hypertrophic cardiomyopathy: Secondary | ICD-10-CM | POA: Diagnosis not present

## 2022-04-20 DIAGNOSIS — I10 Essential (primary) hypertension: Secondary | ICD-10-CM

## 2022-04-20 DIAGNOSIS — E0590 Thyrotoxicosis, unspecified without thyrotoxic crisis or storm: Secondary | ICD-10-CM | POA: Diagnosis not present

## 2022-04-20 DIAGNOSIS — E538 Deficiency of other specified B group vitamins: Secondary | ICD-10-CM | POA: Diagnosis not present

## 2022-04-20 DIAGNOSIS — M62838 Other muscle spasm: Secondary | ICD-10-CM | POA: Diagnosis not present

## 2022-04-20 DIAGNOSIS — I110 Hypertensive heart disease with heart failure: Secondary | ICD-10-CM | POA: Insufficient documentation

## 2022-04-20 MED ORDER — MELOXICAM 15 MG PO TABS
15.0000 mg | ORAL_TABLET | Freq: Every day | ORAL | 0 refills | Status: AC | PRN
Start: 2022-04-20 — End: ?

## 2022-04-20 MED ORDER — BACLOFEN 10 MG PO TABS
10.0000 mg | ORAL_TABLET | Freq: Three times a day (TID) | ORAL | 1 refills | Status: DC | PRN
Start: 2022-04-20 — End: 2022-09-18

## 2022-04-20 MED ORDER — AMLODIPINE BESYLATE-VALSARTAN 5-160 MG PO TABS: 1.0000 | ORAL_TABLET | Freq: Every day | ORAL | 1 refills | Status: DC

## 2022-04-20 MED ORDER — LEVOTHYROXINE SODIUM 75 MCG PO TABS
75.0000 ug | ORAL_TABLET | Freq: Every day | ORAL | 1 refills | Status: DC
Start: 2022-04-20 — End: 2022-12-31

## 2022-04-20 NOTE — Progress Notes (Signed)
Name: Madeline Dominguez   MRN: [id number]    DOB: 27-Jan-1965   Date:04/20/2022       Progress Note  Subjective  Chief Complaint  Follow Up  HPI  Cardiomyopathy hypertrophic: it was in the setting of hyperthyroidism : she takes Exforge daily.  She still has SOB with moderate activity and mild orthopnea but no  wheezing, occasionally has lower extremity edema. She has OSA but not compliant with CPAP, history of hyperthyroidism , lost to follow up with cardiologist and advised her to go back for follow up, she wants see someone locally   The 10-year ASCVD risk score (Arnett DK, et al., 2019) is: 4.9%   Values used to calculate the score:     Age: 57 years     Sex: Female     Is Non-Hispanic African American: Yes     Diabetic: No     Tobacco smoker: No     Systolic Blood Pressure: 132 mmHg     Is BP treated: Yes     HDL Cholesterol: 65 mg/dL     Total Cholesterol: 212 mg/dL    HTN: bp today is at goal, no chest pain or palpitation  used to see nephrologist but released from their care.  She takes Exforge .She has been compliant with medication .   Major Depression recurrent : severe episode in 2018 with anhedonia, lack of appetite, she had pain all over, unable to work for months. She is still seeing psychiatrist and taking zoloft 125 mg daily , she states when she goes out she feels better, her daughter is planning on taking her out of the house more often. She does not want to go back to psychiatrist at this time or change medications even though phq 9 is positive   Adrenal nodule left/ history of hyperthyroidism now hypothyroidism after thyroid ablation: no symptoms, many tests done, she was  going to [facility]  last visit 2021 it was considered non functional adrenal adenoma. She also had a goiter, status post iodine ablation , she is taking levothyroxine and last TSH was at goal. She has difficulty keeping up with appointments and prefers not going back to Endo at this time    Morbid  obesity: discussed life style modification again, weight is trending, cannot be active due to knee pain.  Her pool is not working, she has lost 14 lbs in the past 6 months, she skips meals. Discussed a healthy diet    GERD: she states off medications and no longer having problems Stable    OA: she continues to have knee pain, discussed meloxicam prn and tylenol daily, no effusion today   Muscle spasms: she takes baclofen prn only    Patient Active Problem List   Diagnosis Date Noted   Hypertensive heart disease with heart failure 04/20/2022   Dilated cardiomyopathy 05/12/2019   Stress incontinence in female 03/25/2017   History of deep venous thrombosis (DVT) of distal vein of right lower extremity 01/18/2017   Adrenal adenoma, left 09/11/2016   Current moderate episode of major depressive disorder without prior episode 08/10/2016   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 07/10/2016   History of radioactive iodine thyroid ablation    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   Iron deficiency anemia 07/27/2015   B12 deficiency 07/27/2015   History of pulmonary embolus (PE) 07/25/2015   Pulmonary nodules/lesions, multiple 07/25/2015   OSA (obstructive sleep apnea) 05/18/2015   Thyroid nodule 12/17/2014   Gastroesophageal reflux disease  without esophagitis 09/02/2014   Morbid obesity, unspecified obesity type 06/22/2014   Large breasts 06/22/2014   H/O hyperthyroidism 06/22/2014   Right ventricular dilation 06/22/2014   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    Past Surgical History:  Procedure Laterality Date   COLONOSCOPY WITH PROPOFOL N/A 10/25/2020   Procedure: COLONOSCOPY WITH PROPOFOL;  Surgeon: Wyline Mood, MD;  Location[facility] END[facility]COPY;  Service: Gastroenterology;  Laterality: N/A;   LAPAROTOMY     for after childbirth    Family History  Problem Relation Age of Onset   Hypertension Mother    CVA Father    Deep vein thrombosis Brother     Pulmonary embolism Brother    Anemia Sister     Social History   Tobacco Use   Smoking status: Never   Smokeless tobacco: Never  Substance Use Topics   Alcohol use: No    Alcohol/week: 0.0 standard drinks of alcohol    Comment: rarely     Current Outpatient Medications:    Cholecalciferol (VITAMIN D) 50 MCG (2000 UT) CAPS, Take 1 capsule (2,000 Units total) by mouth daily., Disp: 30 capsule, Rfl: 0   sertraline (ZOLOFT) 100 MG tablet, Take 1 tablet (100 mg total) by mouth daily. Total of 125 mg daily. Take along with 25 mg tab., Disp: 90 tablet, Rfl: 1   sertraline (ZOLOFT) 25 MG tablet, Take 1 tablet (25 mg total) by mouth daily. Take with  tablet., Disp: 90 tablet, Rfl: 1   vitamin B-12 1000 MCG tablet, Take 1 tablet (1,000 mcg total) by mouth daily., Disp: 30 tablet, Rfl: 0   amLODipine-valsartan (EXFORGE) 5-160 MG tablet, Take 1 tablet by mouth daily., Disp: 90 tablet, Rfl: 1   baclofen (LIORESAL) 10 MG tablet, Take 1 tablet (10 mg total) by mouth 3 (three) times daily as needed for muscle spasms., Disp: 30 tablet, Rfl: 1   levothyroxine (SYNTHROID) 75 MCG tablet, Take 1 tablet (75 mcg total) by mouth daily before breakfast., Disp: 90 tablet, Rfl: 1   meloxicam (MOBIC) 15 MG tablet, Take 1 tablet (15 mg total) by mouth daily as needed for pain. Moderate pain, try tylenol 500 mg twice daily other days, Disp: 90 tablet, Rfl: 0  Allergies  Allergen Reactions   Aspirin Nausea And Vomiting   Hctz [Hydrochlorothiazide] Other (See Comments)    Nausea, cramping   Tomato Itching and Swelling    I personally reviewed active problem list, medication list, allergies, family history, social history, health maintenance with the patient/caregiver today.   ROS  Constitutional: Negative for fever , positive for weight change - loss .  Respiratory: Negative for cough and shortness of breath.   Cardiovascular: Negative for chest pain or palpitations.  Gastrointestinal: Negative  for abdominal pain, no bowel changes.  Musculoskeletal: Negative for gait problem or joint swelling.  Skin: Negative for rash.  Neurological: Negative for dizziness or headache.  No other specific complaints in a complete review of systems (except as listed in HPI above).   Objective  Vitals:   04/20/22 1154  BP: 132/86  Pulse: 93  Resp: 16  SpO2: 97%  Weight: (!) 376 lb (170.6 kg)  Height:  (1.702 m)    Body mass index is 58.89 kg/m.  Physical Exam  Constitutional: Patient appears well-developed and well-nourished. Obese  No distress.  HEENT: head atraumatic, normocephalic, pupils equal and reactive to light, neck supple Cardiovascular: Normal rate, regular rhythm and normal heart sounds.  No murmur heard. No BLE edema. Pulmonary/Chest:  Effort normal and breath sounds normal. No respiratory distress. Abdominal: Soft.  There is no tenderness. Psychiatric: Patient has a normal mood and affect. behavior is normal. Judgment and thought content normal.   Recent Results (from the past 2160 hour(s))  TSH     Status: None   Collection Time: 03/16/22  3:29 PM  Result Value Ref Range   TSH 3.70 mIU/L    Comment:           Reference Range .           > or = 20 Years  0.40-4.50 .                Pregnancy Ranges           First trimester    0.26-2.66           Second trimester   0.55-2.73           Third trimester    0.43-2.91     PHQ2/9:    04/20/2022   11:55 AM 02/19/2022    1:49 PM 11/22/2021    8:35 AM 09/05/2021    9:15 AM 03/24/2021    9:44 AM  Depression screen PHQ 2/9  Decreased Interest Down, Depressed, Hopeless PHQ - 2 Score Altered sleeping Tired, decreased energy 3 0 0 0   Change in appetite 3 0 0 0   Feeling bad or failure about yourself  0 0 0 0   Trouble concentrating 0   Moving slowly or fidgety/restless 0 0 0 0   Suicidal thoughts 0 0 0 0   PHQ-9 Score Difficult doing work/chores  Somewhat  difficult        Information is confidential and restricted. Go to Review Flowsheets to unlock data.    phq 9 is positive   Fall Risk:    04/20/2022   11:55 AM 02/19/2022    1:49 PM 11/22/2021    8:35 AM 09/05/2021    9:15 AM 09/29/2020    9:28 AM  Fall Risk   Falls in the past year? 0 0 0 0 0  Number falls in past yr: 0 0 0 0 0  Injury with Fall? 0 0 0 0 0  Risk for fall due to : No Fall Risks No Fall Risks No Fall Risks No Fall Risks No Fall Risks  Follow up Falls prevention discussed Falls prevention discussed;Education provided;Falls evaluation completed Falls prevention discussed Falls prevention discussed Falls prevention discussed      Functional Status Survey: Is the patient deaf or have difficulty hearing?: Yes Does the patient have difficulty seeing, even when wearing glasses/contacts?: No Does the patient have difficulty concentrating, remembering, or making decisions?: Yes Does the patient have difficulty walking or climbing stairs?: Yes Does the patient have difficulty dressing or bathing?: No Does the patient have difficulty doing errands alone such as visiting a doctor's office or shopping?: No    Assessment & Plan  1. Major depression, recurrent, chronic  Continue medication  2. Morbid obesity  Discussed with the patient the risk posed by an increased BMI. Discussed importance of portion control, calorie counting and at least 150 minutes of physical activity weekly. Avoid sweet beverages and drink more water. Eat at least 6 servings of fruit and vegetables daily    3. Hypertension,  benign  - amLODipine-valsartan (EXFORGE) 5-160 MG tablet; Take 1 tablet by mouth daily.  Dispense: 90 tablet; Refill: 1  4. Muscle spasm  - baclofen (LIORESAL) 10 MG tablet; Take 1 tablet (10 mg total) by mouth 3 (three) times daily as needed for muscle spasms.  Dispense: 30 tablet; Refill: 1  5. Hypothyroidism, adult  - levothyroxine (SYNTHROID) 75 MCG tablet; Take 1  tablet (75 mcg total) by mouth daily before breakfast.  Dispense: 90 tablet; Refill: 1  6. Primary osteoarthritis of both knees  - meloxicam (MOBIC) 15 MG tablet; Take 1 tablet (15 mg total) by mouth daily as needed for pain. Moderate pain, try tylenol 500 mg twice daily other days  Dispense: 90 tablet; Refill: 0   9. 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 B12 deficiency   Continue supplementation

## 2022-05-18 ENCOUNTER — Ambulatory Visit: Payer: Medicaid Other | Admitting: Family Medicine

## 2022-05-24 ENCOUNTER — Ambulatory Visit: Payer: Medicaid Other | Admitting: Cardiology

## 2022-05-24 ENCOUNTER — Encounter: Payer: Self-pay | Admitting: Family Medicine

## 2022-07-02 NOTE — Progress Notes (Deleted)
Cardiology Office Note:   Date:  07/02/2022  ID:  Madeline Dominguez, DOB 1965-06-11, MRN [id number]  History of Present Illness:   Madeline Dominguez is a 57 y.o. female with history of chronic systolic HF with improved EF (40-98%>11-91%), HTN, history of PE while on OCP, and morbid obesity who was referred by Dr. Carlynn Purl for further management of HF.  Patient was previously followed by Dr. Graciela Husbands. Has history of mildly reduced EF in 2017 at 45-50% that improved to 55-60% in 2018. This occurred in the setting of an acute PE. She was lost to follow-up and is now re-referred to establish care.  Today ***  Past Medical History:  Diagnosis Date   Adrenal adenoma    a. normal cortisol and aldosterone levels   Cough 07/25/2015   Dilated cardiomyopathy (HCC)    a. TTE 2/15: EF of 55-60%, dilated LV & LA, and nl RV; b. TTE 6/16:EF 55-60%, no RWMA, nl LV dias fxn, LV cavity size nl with mild concentric LVH, mildly dilated ascending aorta, LA nl size, RV sys fxn nl, PASP nl; c. TTE 7/17: EF 45-50%, unable to exclude RWMA, nl LV dias faxn, mildly dilated RV w/ nl sys fxn, PASP nl; d. TTE 7/18: EF 45-50%, mild LVH, Gr1DD, mildly dilated LA, mildly dilated RV   Goiter    a. s/p radioactive iodine treatment in 2010   Heart palpitations    Hypertension    Iron deficiency    Large breasts    Morbid obesity (HCC)    Postpartum hemorrhage    Pulmonary embolism (HCC) 07/2015   a. s/p Eliquis; b. hypercoag work up neg; c. felt to be 2/2 OCP     ROS: ***  Studies Reviewed:    EKG:  ***  Cardiac Studies & Procedures       ECHOCARDIOGRAM  ECHOCARDIOGRAM COMPLETE 07/03/2016  Narrative *[facility]* [address] [place], [place] [address] [phone]  ------------------------------------------------------------------- Transthoracic Echocardiography  (Report amended )  Patient:    Madeline Dominguez, Madeline Dominguez MR #:       [id number] Study Date: 07/02/2016 Gender:     F Age:         5 Height:     170.2 cm Weight:     158.2 kg BSA:        2.83 m^2 Pt. Status: Room:       239A  ATTENDING    Festus Holts REFERRING    Marcelino Duster D ADMITTING    Arnaldo Natal SONOGRAPHER  [facility] PERFORMING   [facility]  cc:  ------------------------------------------------------------------- LV EF: 45% -   50%  ------------------------------------------------------------------- Indications:      Chest pain 786.51.  ------------------------------------------------------------------- Study Conclusions  - Left ventricle: The cavity size was normal. Wall thickness was increased in a pattern of mild LVH. Faint echos noted along the inferolateral wall near the apex, unchanged from prior echo in 07/2015. Considerations including prominent trabeculation, focal hypertrophy, and LV noncompaction. Thrombus is felt less likely. Limited study with Definity could be helpful for further evaluation. Systolic function was mildly reduced. The estimated ejection fraction was in the range of 45% to 50%. Regional wall motion abnormalities cannot be excluded. Doppler parameters are consistent with abnormal left ventricular relaxation (grade 1 diastolic dysfunction). - Left atrium: The atrium was mildly dilated. - Right ventricle: The cavity size was mildly dilated. Systolic function was normal. - Right atrium: The atrium was  mildly dilated.  ------------------------------------------------------------------- Study data:   Study status:  Routine.  Procedure:  Transthoracic echocardiography. Image quality was adequate. Transthoracic echocardiography.  M-mode, complete 2D, spectral Doppler, and color Doppler.  Birthdate:  Patient birthdate: 1965-12-11.  Age:  Patient is 57 yr old.  Sex:  Gender: female. BMI: 54.6 kg/m^2.  Patient status:  Inpatient.  Study date:  Study date: 07/02/2016. Study time: 06:34  PM.  -------------------------------------------------------------------  ------------------------------------------------------------------- Left ventricle:  The cavity size was normal. Wall thickness was increased in a pattern of mild LVH. Faint echos noted along the inferolateral wall near the apex, unchanged from prior echo in 07/2015. Considerations including prominent trabeculation, focal hypertrophy, and LV noncompaction. Thrombus is felt less likely. Systolic function was mildly reduced. The estimated ejection fraction was in the range of 45% to 50%. Regional wall motion abnormalities cannot be excluded. Doppler parameters are consistent with abnormal left ventricular relaxation (grade 1 diastolic dysfunction).  ------------------------------------------------------------------- Aortic valve:  Poorly visualized.  Doppler:  Transvalvular velocity was within the normal range. There was no stenosis. There was no significant regurgitation.  ------------------------------------------------------------------- Aorta:  Aortic root: The aortic root was normal in size.  ------------------------------------------------------------------- Mitral valve:   Structurally normal valve.   Leaflet separation was normal.  Doppler:  Transvalvular velocity was within the normal range. There was no evidence for stenosis. There was trivial regurgitation.    Valve area by pressure half-time: 3.79 cm^2. Indexed valve area by pressure half-time: 1.34 cm^2/m^2.    Peak gradient (D): 3 mm Hg.  ------------------------------------------------------------------- Left atrium:  The atrium was mildly dilated.  ------------------------------------------------------------------- Right ventricle:  The cavity size was mildly dilated. Systolic function was normal.  ------------------------------------------------------------------- Pulmonic valve:   Poorly visualized.  Doppler:  Transvalvular velocity was  within the normal range. There was no evidence for stenosis. There was mild regurgitation.  ------------------------------------------------------------------- Tricuspid valve:  Poorly visualized.  ------------------------------------------------------------------- Pulmonary artery:   Poorly visualized.  ------------------------------------------------------------------- Right atrium:  The atrium was mildly dilated.  ------------------------------------------------------------------- Pericardium:  Not well visualized.  ------------------------------------------------------------------- Systemic veins: Inferior vena cava: Not well visualized.  ------------------------------------------------------------------- Measurements  Left ventricle                         Value          Reference LV ID, ED, PLAX chordal        (H)     61.4  mm       43 - 52 LV ID, ES, PLAX chordal        (H)     42.1  mm       23 - 38 LV fx shortening, PLAX chordal         31    %        >=29 LV PW thickness, ED                    11.46 mm       ---------- IVS/LV PW ratio, ED                    0.94           <=1.3 LV e&', lateral                         10.1  cm/s     ---------- LV E/e&', lateral  8.66           ---------- LV e&', medial                          5.98  cm/s     ---------- LV E/e&', medial                        14.63          ---------- LV e&', average                         8.04  cm/s     ---------- LV E/e&', average                       10.88          ----------  Ventricular septum                     Value          Reference IVS thickness, ED                      10.81 mm       ----------  Aorta                                  Value          Reference Aortic root ID, ED                     29    mm       ----------  Left atrium                            Value          Reference LA ID, A-P, ES                         41    mm       ---------- LA ID/bsa, A-P                          1.45  cm/m^2   <=2.2 LA volume, S                           58.2  ml       ---------- LA volume/bsa, S                       20.6  ml/m^2   ---------- LA volume, ES, 1-p A4C                 54.2  ml       ---------- LA volume/bsa, ES, 1-p A4C             19.1  ml/m^2   ---------- LA volume, ES, 1-p A2C                 56.3  ml       ---------- LA volume/bsa, ES, 1-p A2C             19.9  ml/m^2   ----------  Mitral  valve                           Value          Reference Mitral E-wave peak velocity            87.5  cm/s     ---------- Mitral A-wave peak velocity            105   cm/s     ---------- Mitral deceleration time               197   ms       150 - 230 Mitral pressure half-time              58    ms       ---------- Mitral peak gradient, D                3     mm Hg    ---------- Mitral E/A ratio, peak                 0.8            ---------- Mitral valve area, PHT, DP             3.79  cm^2     ---------- Mitral valve area/bsa, PHT, DP         1.34  cm^2/m^2 ----------  Right atrium                           Value          Reference RA ID, S-I, ES, A4C            (H)     62.9  mm       34 - 49 RA area, ES, A4C                       18.7  cm^2     8.3 - 19.5 RA volume, ES, A/L                     44.9  ml       ---------- RA volume/bsa, ES, A/L                 15.9  ml/m^2   ----------  Systemic veins                         Value          Reference Estimated CVP                          15    mm Hg    ----------  Right ventricle                        Value          Reference TAPSE                                  41.2  mm       ---------- RV s&', lateral, S                      15.3  cm/s     ----------  Legend: (L)  and  (H)  mark values outside specified reference range.  ------------------------------------------------------------------- Magda Paganini, MD 2018-07-03T16:44:45              Risk Assessment/Calculations:   {Does  this patient have ATRIAL FIBRILLATION?[phone]} No BP recorded.  {Refresh Note OR Click here to enter BP  :1}***        Physical Exam:   VS:  There were no vitals taken for this visit.   Wt Readings from Last 3 Encounters:  04/20/22 (!) 376 lb (170.6 kg)  11/22/21 (!) 392 lb (177.8 kg)  09/05/21 (!) 392 lb (177.8 kg)     GEN: Well nourished, well developed in no acute distress NECK: No JVD; No carotid bruits CARDIAC: ***RRR, no murmurs, rubs, gallops RESPIRATORY:  Clear to auscultation without rales, wheezing or rhonchi  ABDOMEN: Soft, non-tender, non-distended EXTREMITIES:  No edema; No deformity   ASSESSMENT AND PLAN:   #Chronic Systolic HF with Improved EF: -EF mildly reduced at 45-50% in the setting of PE that improved to 55-60% in 2018 -Currently, *** -Continue amlo-valsartan 5-160mg  daily  #HTN: -Continue amlo-valsartan 5-160mg  daily  #History of PE: -Thought to be due to OCP use -Completed apixaban course with no recurrence    {Are you ordering a CV Procedure (e.g. stress test, cath, DCCV, TEE, etc)?   Press F2        :[id number]}   Signed, Meriam Sprague, MD

## 2022-07-04 ENCOUNTER — Ambulatory Visit: Payer: Medicaid Other | Admitting: Cardiology

## 2022-07-06 ENCOUNTER — Ambulatory Visit: Payer: Medicaid Other | Admitting: Cardiology

## 2022-08-22 ENCOUNTER — Other Ambulatory Visit: Payer: Self-pay | Admitting: Family Medicine

## 2022-08-22 DIAGNOSIS — F321 Major depressive disorder, single episode, moderate: Secondary | ICD-10-CM

## 2022-08-23 NOTE — Telephone Encounter (Signed)
Requested Prescriptions  Pending Prescriptions Disp Refills   sertraline (ZOLOFT) 100 MG tablet [Pharmacy Med Name: Sertraline HCl 100 MG Oral Tablet] 90 tablet 0    Sig: TAKE 1 TABLET BY MOUTH ONCE DAILY ALONG  WITH  25MG   TAB  (125MG /DAY)     Psychiatry:  Antidepressants - SSRI - sertraline Passed - 08/22/2022  8:42 PM      Passed - AST in normal range and within 360 days    AST  Date Value Ref Range Status  11/22/2021 10 10 - 35 U/L Final         Passed - ALT in normal range and within 360 days    ALT  Date Value Ref Range Status  11/22/2021 8 6 - 29 U/L Final         Passed - Completed PHQ-2 or PHQ-9 in the last 360 days      Passed - Valid encounter within last 6 months    Recent Outpatient Visits           4 months ago Major depression, recurrent, chronic (HCC)   [facility], Danna Hefty, MD   6 months ago Current moderate episode of major depressive disorder without prior episode [facility])   [facility] Caro Laroche, DO   9 months ago Well adult exam   [facility] Alba Cory, MD   11 months ago Morbid obesity [facility])   [facility] Alba Cory, MD   1 year ago Adrenal nodule [facility])   [place] [facility] Alba Cory, MD       Future Appointments             In 2 weeks Pricilla Riffle, MD [facility]   In 3 weeks Alba Cory, MD [facility], [facility]

## 2022-09-12 ENCOUNTER — Ambulatory Visit: Payer: Medicaid Other | Attending: Cardiology | Admitting: Internal Medicine

## 2022-09-13 ENCOUNTER — Encounter: Payer: Self-pay | Admitting: Internal Medicine

## 2022-09-17 NOTE — Progress Notes (Unsigned)
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  She still has SOB with moderate activity and mild orthopnea but no  wheezing, occasionally has lower extremity edema. She has OSA but not compliant with CPAP, history of hyperthyroidism , lost to follow up with cardiologist , due to no shows had to be sent to [facility] but missed appointment last week. She states she called to cancel but they didn't answer the phone. We will give her the number so she can call them directly   The 10-year ASCVD risk score (Arnett DK, et al., 2019) is: 4.7%   Values used to calculate the score:     Age: 57 years     Sex: Female     Is Non-Hispanic African American: Yes     Diabetic: No     Tobacco smoker: No     Systolic Blood Pressure: 130 mmHg     Is BP treated: Yes     HDL Cholesterol: 65 mg/dL     Total Cholesterol: 212 mg/dL    HTN: bp today is at goal, no chest pain or palpitation  used to see nephrologist but released from their care.  She takes Exforge .She has been compliant with medication .BP is at goal    Major Depression recurrent : severe episode in 2018 with anhedonia, lack of appetite, she had pain all over, unable to work for months. She was seeing psychiatrist and was taking zoloft 125 mg daily , however stopped taking 25 mg and is currently only taking 100 mg daily , not in remission but does not want to change or adjust dose of medication. She lost to follow up , but does not want go back to psychiatrist at this time.   Adrenal nodule left/ history of hyperthyroidism now hypothyroidism after thyroid ablation: no symptoms, many tests done, she was  going to [facility]  last visit 2021 it was considered non functional adrenal adenoma. She also had a goiter, status post iodine ablation , she is taking  levothyroxine and last TSH was at goal. We will recheck labs today . She has difficulty keeping up with appointments and prefers not going back to Endo at this time .    Morbid obesity: discussed life style modification again, weight is trending, cannot be active due to knee pain. Weight has been stable. She was using her pool to walk but the pump broke and not able to use it all Summer. She is skipping meals, forgets to eat. Usually eating once a day . She is cutting down on sodas, usually one can a day Discussed medication and she is willing to try Zepbound. No family history of thyroid cancer , she never had pancreatitis.    OA: she continues to have knee pain, she takes meloxicam prn and tylenol other times   Muscle spasms: she takes baclofen prn only and needs a refill today    Patient Active Problem List   Diagnosis Date Noted   Hypothyroidism, adult 09/18/2022   Primary osteoarthritis of both knees 09/18/2022   Vitamin D deficiency 09/18/2022   Adrenal nodule (HCC) 09/18/2022   Hypertensive heart disease with heart failure (HCC) 04/20/2022   Dilated cardiomyopathy (HCC) 05/12/2019   Stress incontinence in female 03/25/2017  History of deep venous thrombosis (DVT) of distal vein of right lower extremity 01/18/2017   Adrenal adenoma, left 09/11/2016   Current moderate episode of major depressive disorder without prior episode (HCC) 08/10/2016   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) 07/10/2016   History of radioactive iodine thyroid ablation    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   Iron deficiency anemia 07/27/2015   B12 deficiency 07/27/2015   History of pulmonary embolus (PE) 07/25/2015   Pulmonary nodules/lesions, multiple 07/25/2015   OSA (obstructive sleep apnea) 05/18/2015   Thyroid nodule 12/17/2014   Gastroesophageal reflux disease without esophagitis 09/02/2014   Morbid obesity (HCC) 06/22/2014   Large breasts 06/22/2014    Hypertension, benign 06/22/2014   H/O hyperthyroidism 06/22/2014   Right ventricular dilation 06/22/2014   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    Past Surgical History:  Procedure Laterality Date   COLONOSCOPY WITH PROPOFOL N/A 10/25/2020   Procedure: COLONOSCOPY WITH PROPOFOL;  Surgeon: Wyline Mood, MD;  Location: [facility] ENDOSCOPY;  Service: Gastroenterology;  Laterality: N/A;   LAPAROTOMY     for after childbirth    Family History  Problem Relation Age of Onset   Hypertension Mother    CVA Father    Deep vein thrombosis Brother    Pulmonary embolism Brother    Anemia Sister     Social History   Tobacco Use   Smoking status: Never   Smokeless tobacco: Never  Substance Use Topics   Alcohol use: No    Alcohol/week: 0.0 standard drinks of alcohol    Comment: rarely     Current Outpatient Medications:    amLODipine-valsartan (EXFORGE) 5-160 MG tablet, Take 1 tablet by mouth daily., Disp: 90 tablet, Rfl: 1   baclofen (LIORESAL) 10 MG tablet, Take 1 tablet (10 mg total) by mouth 3 (three) times daily as needed for muscle spasms., Disp: 30 tablet, Rfl: 1   Cholecalciferol (VITAMIN D) 50 MCG (2000 UT) CAPS, Take 1 capsule (2,000 Units total) by mouth daily., Disp: 30 capsule, Rfl: 0   levothyroxine (SYNTHROID) 75 MCG tablet, Take 1 tablet (75 mcg total) by mouth daily before breakfast., Disp: 90 tablet, Rfl: 1   meloxicam (MOBIC) 15 MG tablet, Take 1 tablet (15 mg total) by mouth daily as needed for pain. Moderate pain, try tylenol 500 mg twice daily other days, Disp: 90 tablet, Rfl: 0   sertraline (ZOLOFT) 100 MG tablet, TAKE 1 TABLET BY MOUTH ONCE DAILY ALONG  WITH  25MG   TAB  (125MG /DAY), Disp: 90 tablet, Rfl: 0   vitamin B-12 1000 MCG tablet, Take 1 tablet (1,000 mcg total) by mouth daily., Disp: 30 tablet, Rfl: 0  Allergies  Allergen Reactions   Aspirin Nausea And Vomiting   Hctz [Hydrochlorothiazide] Other (See Comments)    Nausea, cramping   Tomato Itching  and Swelling    I personally reviewed active problem list, medication list, allergies, family history, social history, health maintenance with the patient/caregiver today.   ROS  Ten systems reviewed and is negative except as mentioned in HPI    Objective  Vitals:   09/18/22 0940  BP: 130/84  Pulse: 75  Resp: 18  Temp: 98 F (36.7 C)  TempSrc: Oral  SpO2: 94%  Weight: (!) 378 lb 4.8 oz (171.6 kg)  Height: 5\' 7"  (1.702 m)    Body mass index is 59.25 kg/m.  Physical Exam  Constitutional: Patient appears well-developed and well-nourished. Obese  No distress.  HEENT: head atraumatic, normocephalic, pupils equal and  reactive to light, neck supple Cardiovascular: Normal rate, regular rhythm and normal heart sounds.  No murmur heard. No BLE edema. Pulmonary/Chest: Effort normal and breath sounds normal. No respiratory distress. Abdominal: Soft.  There is no tenderness. Psychiatric: Patient has a normal mood and affect. behavior is normal. Judgment and thought content normal.   PHQ2/9:    09/18/2022    9:42 AM 04/20/2022   11:55 AM 02/19/2022    1:49 PM 11/22/2021    8:35 AM 09/05/2021    9:15 AM  Depression screen PHQ 2/9  Decreased Interest 2 2 1 1 1   Down, Depressed, Hopeless 1 1 1 1 1   PHQ - 2 Score 3 3 2 2 2   Altered sleeping 3 3 1 3 3   Tired, decreased energy 1 3 0 0 0  Change in appetite 1 3 0 0 0  Feeling bad or failure about yourself  1 0 0 0 0  Trouble concentrating 1 1 2 3  0  Moving slowly or fidgety/restless 0 0 0 0 0  Suicidal thoughts 0 0 0 0 0  PHQ-9 Score 10 13 5 8 5   Difficult doing work/chores Not difficult at all  Somewhat difficult      phq 9 is positive   Fall Risk:    09/18/2022    9:42 AM 04/20/2022   11:55 AM 02/19/2022    1:49 PM 11/22/2021    8:35 AM 09/05/2021    9:15 AM  Fall Risk   Falls in the past year? 0 0 0 0 0  Number falls in past yr:  0 0 0 0  Injury with Fall?  0 0 0 0  Risk for fall due to : No Fall Risks No Fall Risks No  Fall Risks No Fall Risks No Fall Risks  Follow up Falls prevention discussed Falls prevention discussed Falls prevention discussed;Education provided;Falls evaluation completed Falls prevention discussed Falls prevention discussed      Functional Status Survey: Is the patient deaf or have difficulty hearing?: Yes Does the patient have difficulty seeing, even when wearing glasses/contacts?: Yes Does the patient have difficulty concentrating, remembering, or making decisions?: Yes Does the patient have difficulty walking or climbing stairs?: Yes Does the patient have difficulty dressing or bathing?: No Does the patient have difficulty doing errands alone such as visiting a doctor's office or shopping?: No    Assessment & Plan  1. Morbid obesity (HCC)  Discussed with the patient the risk posed by an increased BMI. Discussed importance of portion control, calorie counting and at least 150 minutes of physical activity weekly. Avoid sweet beverages and drink more water. Eat at least 6 servings of fruit and vegetables daily    Sent Zepbound to pharmacy, to start on lower dose and titrate up, return in 3 months. Discussed possible side effects  2. Adrenal nodule (HCC)  Labs normal   3. 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 Current moderate episode of major depressive disorder without prior episode (HCC)  - sertraline (ZOLOFT) 100 MG tablet; Take 1 tablet (100 mg total) by mouth daily.  Dispense: 90 tablet; Refill: 1  5. Primary osteoarthritis of both knees  stable  6. Hypertension, benign  - amLODipine-valsartan (EXFORGE) 5-160 MG tablet; Take 1 tablet by mouth daily.  Dispense: 90 tablet; Refill: 1 - CBC with Differential/Platelet - COMPLETE METABOLIC PANEL WITH GFR  7. Hypothyroidism, adult  - TSH  8. B12  deficiency  - B12 and Folate Panel  9. Vitamin D deficiency  -  VITAMIN D 25 Hydroxy (Vit-D Deficiency, Fractures)  11. Muscle spasm  - baclofen (LIORESAL) 10 MG tablet; Take 1 tablet (10 mg total) by mouth 3 (three) times daily as needed for muscle spasms.  Dispense: 30 tablet; Refill: 1  12. Hypercholesterolemia  - Lipid panel .

## 2022-09-18 ENCOUNTER — Encounter: Payer: Self-pay | Admitting: Family Medicine

## 2022-09-18 ENCOUNTER — Ambulatory Visit (INDEPENDENT_AMBULATORY_CARE_PROVIDER_SITE_OTHER): Payer: Medicaid Other | Admitting: Family Medicine

## 2022-09-18 DIAGNOSIS — I10 Essential (primary) hypertension: Secondary | ICD-10-CM

## 2022-09-18 DIAGNOSIS — M62838 Other muscle spasm: Secondary | ICD-10-CM

## 2022-09-18 DIAGNOSIS — E279 Disorder of adrenal gland, unspecified: Secondary | ICD-10-CM | POA: Insufficient documentation

## 2022-09-18 DIAGNOSIS — E0590 Thyrotoxicosis, unspecified without thyrotoxic crisis or storm: Secondary | ICD-10-CM

## 2022-09-18 DIAGNOSIS — I422 Other hypertrophic cardiomyopathy: Secondary | ICD-10-CM

## 2022-09-18 DIAGNOSIS — E7800 Pure hypercholesterolemia, unspecified: Secondary | ICD-10-CM | POA: Diagnosis not present

## 2022-09-18 DIAGNOSIS — E039 Hypothyroidism, unspecified: Secondary | ICD-10-CM | POA: Diagnosis not present

## 2022-09-18 DIAGNOSIS — E559 Vitamin D deficiency, unspecified: Secondary | ICD-10-CM | POA: Diagnosis not present

## 2022-09-18 DIAGNOSIS — E538 Deficiency of other specified B group vitamins: Secondary | ICD-10-CM | POA: Diagnosis not present

## 2022-09-18 DIAGNOSIS — E278 Other specified disorders of adrenal gland: Secondary | ICD-10-CM | POA: Diagnosis not present

## 2022-09-18 DIAGNOSIS — F321 Major depressive disorder, single episode, moderate: Secondary | ICD-10-CM

## 2022-09-18 DIAGNOSIS — M170 Bilateral primary osteoarthritis of knee: Secondary | ICD-10-CM | POA: Diagnosis not present

## 2022-09-18 MED ORDER — TIRZEPATIDE-WEIGHT MANAGEMENT 5 MG/0.5ML ~~LOC~~ SOLN
5.0000 mg | SUBCUTANEOUS | 0 refills | Status: DC
Start: 2022-09-18 — End: 2022-12-20

## 2022-09-18 MED ORDER — ZEPBOUND 2.5 MG/0.5ML ~~LOC~~ SOAJ: 2.5000 mg | SUBCUTANEOUS | 0 refills | Status: DC

## 2022-09-18 MED ORDER — SERTRALINE HCL 100 MG PO TABS: 100.0000 mg | ORAL_TABLET | Freq: Every day | ORAL | 1 refills | Status: DC

## 2022-09-18 MED ORDER — ZEPBOUND 7.5 MG/0.5ML ~~LOC~~ SOAJ
7.5000 mg | SUBCUTANEOUS | 0 refills | Status: DC
Start: 2022-09-18 — End: 2022-12-20

## 2022-09-18 MED ORDER — BACLOFEN 10 MG PO TABS: 10.0000 mg | ORAL_TABLET | Freq: Three times a day (TID) | ORAL | 1 refills | Status: DC | PRN

## 2022-09-18 MED ORDER — AMLODIPINE BESYLATE-VALSARTAN 5-160 MG PO TABS
1.0000 | ORAL_TABLET | Freq: Every day | ORAL | 1 refills | Status: DC
Start: 2022-09-18 — End: 2023-04-02

## 2022-09-19 LAB — CBC WITH DIFFERENTIAL/PLATELET
Absolute Monocytes: 439 {cells}/uL (ref 200–950)
Basophils Absolute: 18 {cells}/uL (ref 0–200)
Basophils Relative: 0.3 %
Eosinophils Absolute: 128 {cells}/uL (ref 15–500)
Eosinophils Relative: 2.1 %
HCT: 37.5 % (ref 35.0–45.0)
Hemoglobin: 12.3 g/dL (ref 11.7–15.5)
Lymphs Abs: 2391 {cells}/uL (ref 850–3900)
MCH: 30.2 pg (ref 27.0–33.0)
MCHC: 32.8 g/dL (ref 32.0–36.0)
MCV: 92.1 fL (ref 80.0–100.0)
MPV: 11.4 fL (ref 7.5–12.5)
Monocytes Relative: 7.2 %
Neutro Abs: 3123 {cells}/uL (ref 1500–7800)
Neutrophils Relative %: 51.2 %
Platelets: 252 10*3/uL (ref 140–400)
RBC: 4.07 10*6/uL (ref 3.80–5.10)
RDW: 12.1 % (ref 11.0–15.0)
Total Lymphocyte: 39.2 %
WBC: 6.1 10*3/uL (ref 3.8–10.8)

## 2022-11-12 ENCOUNTER — Encounter: Payer: Self-pay | Admitting: Cardiovascular Disease

## 2022-11-12 ENCOUNTER — Ambulatory Visit: Payer: Medicaid Other | Attending: Cardiology | Admitting: Cardiovascular Disease

## 2022-11-12 VITALS — BP 143/87 | HR 76 | Ht 67.0 in | Wt 382.6 lb

## 2022-11-12 DIAGNOSIS — I10 Essential (primary) hypertension: Secondary | ICD-10-CM | POA: Diagnosis not present

## 2022-11-12 DIAGNOSIS — E785 Hyperlipidemia, unspecified: Secondary | ICD-10-CM | POA: Insufficient documentation

## 2022-11-12 DIAGNOSIS — E0590 Thyrotoxicosis, unspecified without thyrotoxic crisis or storm: Secondary | ICD-10-CM | POA: Diagnosis not present

## 2022-11-12 DIAGNOSIS — I422 Other hypertrophic cardiomyopathy: Secondary | ICD-10-CM | POA: Diagnosis not present

## 2022-11-12 DIAGNOSIS — E782 Mixed hyperlipidemia: Secondary | ICD-10-CM | POA: Diagnosis not present

## 2022-11-12 NOTE — Progress Notes (Signed)
11/12/2022 Madeline Dominguez   21-Jul-1965  [id number]  Primary Physician Alba Cory, MD Primary Cardiologist: Runell Gess MD Nicholes Calamity, [place]  HPI:  Madeline Dominguez is a 57 y.o. 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  She was referred back to see [place] by her PCP, Dr. Carlynn Purl, to be reestablished.  She did see Dr. Graciela Husbands remotely 07/31/2016.  She has a history of hypertension, palpitations in the past and a "cardiomyopathy" with an EF in the 45 to 50% range.  Her risk factors include treated hypertension, untreated mild hyperlipidemia.  There is no family history.  She is never had a heart attack or stroke.  She denies chest pain but occasionally gets dyspnea probably from deconditioning and morbid obesity.  She does have sleep apnea but is not compliant with CPAP.  She has a very sedentary lifestyle.   Current Meds  Medication Sig   amLODipine-valsartan (EXFORGE) 5-160 MG tablet Take 1 tablet by mouth daily.   baclofen (LIORESAL) 10 MG tablet Take 1 tablet (10 mg total) by mouth 3 (three) times daily as needed for muscle spasms.   Cholecalciferol (VITAMIN D) 50 MCG (2000 UT) CAPS Take 1 capsule (2,000 Units total) by mouth daily.   levothyroxine (SYNTHROID) 75 MCG tablet Take 1 tablet (75 mcg total) by mouth daily before breakfast.   meloxicam (MOBIC) 15 MG tablet Take 1 tablet (15 mg total) by mouth daily as needed for pain. Moderate pain, try tylenol 500 mg twice daily other days   sertraline (ZOLOFT) 100 MG tablet Take 1 tablet (100 mg total) by mouth daily.   tirzepatide (ZEPBOUND) 2.5 MG/0.5ML Pen Inject 2.5 mg into the skin once a week.   tirzepatide (ZEPBOUND) 7.5 MG/0.5ML Pen Inject 7.5 mg into the skin once a week.   Tirzepatide-Weight Management 5 MG/0.5ML SOLN Inject 5 mg into the skin once a week.   vitamin B-12 1000 MCG tablet Take 1 tablet (1,000  mcg total) by mouth daily.     Allergies  Allergen Reactions   Aspirin Nausea And Vomiting   Hctz [Hydrochlorothiazide] Other (See Comments)    Nausea, cramping   Tomato Itching and Swelling    Social History   Socioeconomic History   Marital status: Married    Spouse name: Not on file   Number of children: 3   Years of education: Not on file   Highest education level: High school graduate  Occupational History   Not on file  Tobacco Use   Smoking status: Never   Smokeless tobacco: Never  Vaping Use   Vaping status: Never Used  Substance and Sexual Activity   Alcohol use: No    Alcohol/week: 0.0 standard drinks of alcohol    Comment: rarely   Drug use: No   Sexual activity: Not Currently    Partners: Male    Birth control/protection: Condom  Other Topics Concern   Not on file  Social History Narrative   Lives with 3 with 3 daughters, husband was incarcerated April 2018 - for stealing, she has not been keeping in touch with him because she is disappointed. 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    She lost her job secondary COVID-19    She is trying to take Med IT trainer    Social Determinants of Health   Financial Resource Strain:  Medium Risk (11/22/2021)   Overall Financial Resource Strain (CARDIA)    Difficulty of Paying Living Expenses: Somewhat hard  Food Insecurity: Food Insecurity Present (11/22/2021)   Hunger Vital Sign    Worried About Running Out of Food in the Last Year: Sometimes true    Ran Out of Food in the Last Year: Sometimes true  Transportation Needs: No Transportation Needs (11/22/2021)   PRAPARE - Administrator, Civil Service (Medical): No    Lack of Transportation (Non-Medical): No  Physical Activity: Insufficiently Active (11/22/2021)   Exercise Vital Sign    Days of Exercise per Week: 2 days    Minutes of Exercise per Session: 10 min  Stress: Stress Concern Present  (11/22/2021)   [employer] of Occupational Health - Occupational Stress Questionnaire    Feeling of Stress : Very much  Social Connections: Moderately Integrated (11/22/2021)   Social Connection and Isolation Panel [NHANES]    Frequency of Communication with Friends and Family: More than three times a week    Frequency of Social Gatherings with Friends and Family: More than three times a week    Attends Religious Services: More than 4 times per year    Active Member of [employer] or Organizations: No    Attends Banker Meetings: Never    Marital Status: Married  Catering manager Violence: Not At Risk (11/22/2021)   Humiliation, Afraid, Rape, and Kick questionnaire    Fear of Current or Ex-Partner: No    Emotionally Abused: No    Physically Abused: No    Sexually Abused: No     Review of Systems: General: negative for chills, fever, night sweats or weight changes.  Cardiovascular: negative for chest pain, dyspnea on exertion, edema, orthopnea, palpitations, paroxysmal nocturnal dyspnea or shortness of breath Dermatological: negative for rash Respiratory: negative for cough or wheezing Urologic: negative for hematuria Abdominal: negative for nausea, vomiting, diarrhea, bright red blood per rectum, melena, or hematemesis Neurologic: negative for visual changes, syncope, or dizziness All other systems reviewed and are otherwise negative except as noted above.    Blood pressure (!) 143/87, pulse 76, height 5\' 7"  (1.702 m), weight (!) 382 lb 9.6 oz (173.5 kg), SpO2 92%.  General appearance: alert and no distress Neck: no adenopathy, no carotid bruit, no JVD, supple, symmetrical, trachea midline, and thyroid not enlarged, symmetric, no tenderness/mass/nodules Lungs: clear to auscultation bilaterally Heart: regular rate and rhythm, S1, S2 normal, no murmur, click, rub or gallop Extremities: extremities normal, atraumatic, no cyanosis or edema Pulses: 2+ and symmetric Skin:  Skin color, texture, turgor normal. No rashes or lesions Neurologic: Grossly normal  EKG EKG Interpretation Date/Time:  Monday November 12 2022 09:15:05 EST Ventricular Rate:  76 PR Interval:  200 QRS Duration:  102 QT Interval:  438 QTC Calculation: 492 R Axis:   -8  Text Interpretation: Normal sinus rhythm Minimal voltage criteria for LVH, may be normal variant ( R in aVL ) Cannot rule out Anterior infarct , age undetermined When compared with ECG of 30-Jun-2016 19:48, Non-specific change in ST segment in Lateral leads T wave inversion no longer evident in Inferior leads Nonspecific T wave abnormality, improved in Anterior leads Nonspecific T wave abnormality, worse in Lateral leads QT has lengthened Confirmed by Nanetta Batty [phone]) on 11/12/2022 9:38:15 AM    ASSESSMENT AND PLAN:   Morbid obesity (HCC) BMI of 60.  Apparently her primary care doctor did try to get her on a GLP-1 agonist but this was  not approved by her insurance.  Will retry to do this.  Hypertension, benign History of essential hypertension with blood pressure measured today at 143/87.  She is on amlodipine and valsartan.  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  There was mild LVH as well.  Will recheck a 2D echocardiogram.  Hyperlipidemia History of hyperlipidemia not on statin therapy with lipid profile performed 09/18/2022 revealing total cholesterol 215, LDL 133 and HDL 67.  I am going to get a coronary calcium score to risk stratify her.     Runell Gess MD FACP,FACC,FAHA, [facility] 11/12/2022 9:49 AM

## 2022-11-12 NOTE — Addendum Note (Signed)
Addended by: Jeannette How A on: 11/12/2022 10:52 AM   Modules accepted: Orders

## 2022-11-12 NOTE — Assessment & Plan Note (Signed)
History of hyperlipidemia not on statin therapy with lipid profile performed 09/18/2022 revealing total cholesterol 215, LDL 133 and HDL 67.  I am going to get a coronary calcium score to risk stratify her.

## 2022-11-12 NOTE — Assessment & Plan Note (Signed)
BMI of 60.  Apparently her primary care doctor did try to get her on a GLP-1 agonist but this was not approved by her insurance.  Will retry to do this.

## 2022-11-12 NOTE — Assessment & Plan Note (Signed)
History of mild LV dysfunction with an EF of 45 to 50% by 2D echo performed 07/02/2016.  There was mild LVH as well.  Will recheck a 2D echocardiogram.

## 2022-11-12 NOTE — Patient Instructions (Signed)
Testing/Procedures: Nanetta Batty MD has ordered a CT coronary calcium score.   Test locations:  [facility]  [facility] at [facility]  This is $99 out of pocket.   Coronary CalciumScan A coronary calcium scan is an imaging test used to look for deposits of calcium and other fatty materials (plaques) in the inner lining of the blood vessels of the heart (coronary arteries). These deposits of calcium and plaques can partly clog and narrow the coronary arteries without producing any symptoms or warning signs. This puts a person at risk for a heart attack. This test can detect these deposits before symptoms develop. Tell a health care provider about: Any allergies you have. All medicines you are taking, including vitamins, herbs, eye drops, creams, and over-the-counter medicines. Any problems you or family members have had with anesthetic medicines. Any blood disorders you have. Any surgeries you have had. Any medical conditions you have. Whether you are pregnant or may be pregnant. What are the risks? Generally, this is a safe procedure. However, problems may occur, including: Harm to a pregnant woman and her unborn baby. This test involves the use of radiation. Radiation exposure can be dangerous to a pregnant woman and her unborn baby. If you are pregnant, you generally should not have this procedure done. Slight increase in the risk of cancer. This is because of the radiation involved in the test. What happens before the procedure? No preparation is needed for this procedure. What happens during the procedure? You will undress and remove any jewelry around your neck or chest. You will put on a hospital gown. Sticky electrodes will be placed on your chest. The electrodes will be connected to an electrocardiogram (ECG) machine to record a tracing of the electrical activity of your heart. A CT scanner will  take pictures of your heart. During this time, you will be asked to lie still and hold your breath for 2-3 seconds while a picture of your heart is being taken. The procedure may vary among health care providers and hospitals. What happens after the procedure? You can get dressed. You can return to your normal activities. It is up to you to get the results of your test. Ask your health care provider, or the department that is doing the test, when your results will be ready. Summary A coronary calcium scan is an imaging test used to look for deposits of calcium and other fatty materials (plaques) in the inner lining of the blood vessels of the heart (coronary arteries). Generally, this is a safe procedure. Tell your health care provider if you are pregnant or may be pregnant. No preparation is needed for this procedure. A CT scanner will take pictures of your heart. You can return to your normal activities after the scan is done. This information is not intended to replace advice given to you by your health care provider. Make sure you discuss any questions you have with your health care provider. Document Released: 06/16/2007 Document Revised: 11/07/2015 Document Reviewed: 11/07/2015 Elsevier Interactive Patient Education  2017 [employer].   Your physician has requested that you have an echocardiogram. Echocardiography is a painless test that uses sound waves to create images of your heart. It provides your doctor with information about the size and shape of your heart and how well your heart's chambers and valves are working. This procedure takes approximately one hour. There are no restrictions for this procedure. [address]. Please  do NOT wear cologne, perfume, aftershave, or lotions (deodorant is allowed). Please arrive 15 minutes prior to your appointment time.  Please note: We ask at that you not bring children with you during ultrasound (echo/ vascular) testing. Due to room size and  safety concerns, children are not allowed in the ultrasound rooms during exams. Our front office staff cannot provide observation of children in our lobby area while testing is being conducted. An adult accompanying a patient to their appointment will only be allowed in the ultrasound room at the discretion of the ultrasound technician under special circumstances. We apologize for any inconvenience.    Follow-Up: At [facility], you and your health needs are our priority.  As part of our continuing mission to provide you with exceptional heart care, we have created designated Provider Care Teams.  These Care Teams include your primary Cardiologist (physician) and Advanced Practice Providers (APPs -  Physician Assistants and Nurse Practitioners) who all work together to provide you with the care you need, when you need it.  We recommend signing up for the patient portal called "MyChart".  Sign up information is provided on this After Visit Summary.  MyChart is used to connect with patients for Virtual Visits (Telemedicine).  Patients are able to view lab/test results, encounter notes, upcoming appointments, etc.  Non-urgent messages can be sent to your provider as well.   To learn more about what you can do with MyChart, go to [email or web address].    Your next appointment:   12 month(s)  Provider:   Nanetta Batty MD Other Instructions

## 2022-11-12 NOTE — Addendum Note (Signed)
Addended by: Bernita Buffy on: 11/12/2022 11:07 AM   Modules accepted: Orders

## 2022-11-12 NOTE — Assessment & Plan Note (Signed)
History of essential hypertension with blood pressure measured today at 143/87.  She is on amlodipine and valsartan.

## 2022-12-04 ENCOUNTER — Ambulatory Visit
Admission: RE | Admit: 2022-12-04 | Discharge: 2022-12-04 | Disposition: A | Discharge status: Home / Self Care | Payer: Medicaid Other | Source: Ambulatory Visit | Attending: Cardiology | Admitting: Cardiology

## 2022-12-04 ENCOUNTER — Ambulatory Visit: Payer: Medicaid Other | Attending: Cardiology

## 2022-12-04 DIAGNOSIS — E0590 Thyrotoxicosis, unspecified without thyrotoxic crisis or storm: Secondary | ICD-10-CM | POA: Insufficient documentation

## 2022-12-04 DIAGNOSIS — I422 Other hypertrophic cardiomyopathy: Secondary | ICD-10-CM | POA: Diagnosis not present

## 2022-12-04 LAB — ECHOCARDIOGRAM COMPLETE
AR max vel: 2.17 cm2
AV Area VTI: 2.18 cm2
AV Area mean vel: 2.13 cm2
AV Mean grad: 6 mm[Hg]
AV Peak grad: 11 mm[Hg]
Ao pk vel: 1.66 m/s
Area-P 1/2: 3.45 cm2
S' Lateral: 4.5 cm

## 2022-12-04 MED ORDER — PERFLUTREN LIPID MICROSPHERE
1.0000 mL | INTRAVENOUS | Status: AC | PRN
  Administered 2022-12-04: 5 mL via INTRAVENOUS

## 2022-12-07 NOTE — Progress Notes (Signed)
Name: Madeline Dominguez   MRN: [id number]    DOB: 10-07-1965   Date:12/19/2022       Progress Note  Subjective  Chief Complaint  Chief Complaint  Patient presents with   Medical Management of Chronic Issues    HPI  Discussed the use of AI scribe software for clinical note transcription with the patient, who gave verbal consent to proceed.  History of Present Illness   The patient, with a history of hypertrophic cardiomyopathy, hypertension, sleep apnea, obesity, and depression, presents for a follow-up visit. She reports an increase in right knee pain, described as stiff and achy, over the past two weeks, which she attributes to cooler weather. There is no associated swelling or redness. The patient has been managing the pain with Tylenol and occasional use of topical medication.  The patient's weight has slightly increased since the last visit three months ago, and she expresses a desire to focus on weight loss as a means to improve her overall health, including blood pressure control. She reports portion control and limiting sweet intake to once a week as current weight management strategies.   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 Cardiac calcium score negative, last EF 40-50 %  The patient's depression, which was severe in 2018, has improved on a current regimen of Zoloft 100mg . She reports ongoing, but significantly less severe, depressive symptoms. She expresses a preference to maintain the current medication regimen.  The patient has a history of hypothyroidism following iodine ablation for hyperthyroidism. She admits to missing approximately four to five doses of her thyroid medication in the past month, primarily on weekdays. She expresses a willingness to improve medication adherence.  The patient also reports occasional use of meloxicam for knee pain and baclofen for intermittent muscle spasm for muscle  spasms. She has a nonfunctional adrenal nodule and a history of hyperparathyroidism. She has been advised to schedule a mammogram and is due for a flu shot and shingles vaccination.         Patient Active Problem List   Diagnosis Date Noted   Hyperlipidemia 11/12/2022   Hypothyroidism, adult 09/18/2022   Primary osteoarthritis of both knees 09/18/2022   Vitamin D deficiency 09/18/2022   Adrenal nodule (HCC) 09/18/2022   Hypertensive heart disease with heart failure (HCC) 04/20/2022   Dilated cardiomyopathy (HCC) 05/12/2019   Stress incontinence in female 03/25/2017   History of deep venous thrombosis (DVT) of distal vein of right lower extremity 01/18/2017   Adrenal adenoma, left 09/11/2016   Current moderate episode of major depressive disorder without prior episode (HCC) 08/10/2016   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) 07/10/2016   History of radioactive iodine thyroid ablation    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   Iron deficiency anemia 07/27/2015   B12 deficiency 07/27/2015   History of pulmonary embolus (PE) 07/25/2015   Pulmonary nodules/lesions, multiple 07/25/2015   OSA (obstructive sleep apnea) 05/18/2015   Thyroid nodule 12/17/2014   Gastroesophageal reflux disease without esophagitis 09/02/2014   Morbid obesity (HCC) 06/22/2014   Large breasts 06/22/2014   Hypertension, benign 06/22/2014   H/O hyperthyroidism 06/22/2014   Right ventricular dilation 06/22/2014   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    Past Surgical History:  Procedure Laterality Date   COLONOSCOPY WITH PROPOFOL N/A 10/25/2020   Procedure: COLONOSCOPY WITH PROPOFOL;  Surgeon: Wyline Mood, MD;  Location: [facility] ENDOSCOPY;  Service: Gastroenterology;  Laterality: N/A;   LAPAROTOMY  for after childbirth    Family History  Problem Relation Age of Onset   Hypertension Mother    CVA Father    Deep vein thrombosis Brother    Pulmonary embolism  Brother    Anemia Sister     Social History   Tobacco Use   Smoking status: Never   Smokeless tobacco: Never  Substance Use Topics   Alcohol use: No    Alcohol/week: 0.0 standard drinks of alcohol    Comment: rarely     Current Outpatient Medications:    amLODipine-valsartan (EXFORGE) 5-160 MG tablet, Take 1 tablet by mouth daily., Disp: 90 tablet, Rfl: 1   baclofen (LIORESAL) 10 MG tablet, Take 1 tablet (10 mg total) by mouth 3 (three) times daily as needed for muscle spasms., Disp: 30 tablet, Rfl: 1   Cholecalciferol (VITAMIN D) 50 MCG (2000 UT) CAPS, Take 1 capsule (2,000 Units total) by mouth daily., Disp: 30 capsule, Rfl: 0   levothyroxine (SYNTHROID) 75 MCG tablet, Take 1 tablet (75 mcg total) by mouth daily before breakfast., Disp: 90 tablet, Rfl: 1   meloxicam (MOBIC) 15 MG tablet, Take 1 tablet (15 mg total) by mouth daily as needed for pain. Moderate pain, try tylenol 500 mg twice daily other days, Disp: 90 tablet, Rfl: 0   sertraline (ZOLOFT) 100 MG tablet, Take 1 tablet (100 mg total) by mouth daily., Disp: 90 tablet, Rfl: 1   tirzepatide (ZEPBOUND) 2.5 MG/0.5ML Pen, Inject 2.5 mg into the skin once a week., Disp: 2 mL, Rfl: 0   tirzepatide (ZEPBOUND) 7.5 MG/0.5ML Pen, Inject 7.5 mg into the skin once a week., Disp: 2 mL, Rfl: 0   Tirzepatide-Weight Management 5 MG/0.5ML SOLN, Inject 5 mg into the skin once a week., Disp: 2 mL, Rfl: 0   vitamin B-12 1000 MCG tablet, Take 1 tablet (1,000 mcg total) by mouth daily., Disp: 30 tablet, Rfl: 0  Allergies  Allergen Reactions   Aspirin Nausea And Vomiting   Hctz [Hydrochlorothiazide] Other (See Comments)    Nausea, cramping   Tomato Itching and Swelling    I personally reviewed active problem list, medication list, allergies, family history with the patient/caregiver today.   ROS  Ten systems reviewed and is negative except as mentioned in HPI    Objective  Vitals:   12/19/22 1411  BP: 132/84  Pulse: 88  Resp: 18   Temp: 98.4 F (36.9 C)  SpO2: 96%  Weight: (!) 384 lb (174.2 kg)  Height: 5\' 7"  (1.702 m)    Body mass index is 60.14 kg/m.  Physical Exam  Constitutional: Patient appears well-developed and well-nourished. Obese  No distress.  HEENT: head atraumatic, normocephalic, pupils equal and reactive to light, neck supple Cardiovascular: Normal rate, regular rhythm and normal heart sounds.  No murmur heard. No BLE edema. Pulmonary/Chest: Effort normal and breath sounds normal. No respiratory distress. Abdominal: Soft.  There is no tenderness. Psychiatric: Patient has a normal mood and affect. behavior is normal. Judgment and thought content normal.   Recent Results (from the past 2160 hours)  ECHOCARDIOGRAM COMPLETE     Status: None   Collection Time: 12/04/22  9:28 AM  Result Value Ref Range   AR max vel 2.17 cm2   AV Peak grad 11.0 mmHg   Ao pk vel 1.66 m/s   S' Lateral 4.50 cm   Area-P 1/2 3.45 cm2   AV Area VTI 2.18 cm2   AV Mean grad 6.0 mmHg   AV Area mean vel  2.13 cm2   Est EF 45 - 50%     PHQ2/9:    12/19/2022    2:13 PM 09/18/2022    9:42 AM 04/20/2022   11:55 AM 02/19/2022    1:49 PM 11/22/2021    8:35 AM  Depression screen PHQ 2/9  Decreased Interest 0 2 2 1 1   Down, Depressed, Hopeless 0 1 1 1 1   PHQ - 2 Score 0 3 3 2 2   Altered sleeping 1 3 3 1 3   Tired, decreased energy 1 1 3  0 0  Change in appetite 1 1 3  0 0  Feeling bad or failure about yourself  0 1 0 0 0  Trouble concentrating 1 1 1 2 3   Moving slowly or fidgety/restless 0 0 0 0 0  Suicidal thoughts 0 0 0 0 0  PHQ-9 Score 4 10 13 5 8   Difficult doing work/chores Somewhat difficult Not difficult at all  Somewhat difficult     phq 9 is positive   Fall Risk:    09/18/2022    9:42 AM 04/20/2022   11:55 AM 02/19/2022    1:49 PM 11/22/2021    8:35 AM 09/05/2021    9:15 AM  Fall Risk   Falls in the past year? 0 0 0 0 0  Number falls in past yr:  0 0 0 0  Injury with Fall?  0 0 0 0  Risk for fall due  to : No Fall Risks No Fall Risks No Fall Risks No Fall Risks No Fall Risks  Follow up Falls prevention discussed Falls prevention discussed Falls prevention discussed;Education provided;Falls evaluation completed Falls prevention discussed Falls prevention discussed      Assessment & Plan     Obesity- Morbid BMI 60.14 with associated comorbidities including hypertrophic cardiomyopathy, hypertension, sleep apnea, and knee pain. Previous attempt to prescribe Zepbound unsuccessful due to insurance coverage. Patient reports efforts to control portion sizes and limit sweet intake. -Resubmit prescription for Zepbound to [facility] pharmacy and await response regarding insurance coverage. -If Zepbound is not covered, consider Wegovy or other weight loss medication. -Encourage continued portion control and healthy dietary choices.  Hypertrophic Cardiomyopathy Stable with no reported chest pain. Ejection fraction 40-50%. Cardiologist has recommended weight loss. -Continue current management plan.  Hypertension Blood pressure slightly elevated at 132/84. Patient prefers to attempt weight loss before adjusting medication dosage. -Continue current medication (Exforge 5/160). -Monitor blood pressure and consider medication adjustment if no improvement with weight loss.  Hypothyroidism Patient reports occasional missed doses of thyroid medication. Last TSH level slightly elevated at 6.59. -Advise patient to take missed dose as soon as remembered or double dose the following day if a full day is missed. -Order TSH level to be checked in 6 weeks.  Knee Pain Increased pain in right knee, likely due to osteoarthritis. Patient uses Tylenol and occasional meloxicam for pain management. -Continue current pain management strategy.  Depression/Major recurrent  Patient reports ongoing depression but improved from severe episode in 2018. Currently on Zoloft 100mg  daily. -Continue current dosage of  Zoloft. -Monitor for changes in mood or depressive symptoms.  Adrenal Nodule Nonfunctional nodule identified on left adrenal gland. Patient does not wish to see a specialist at this time. -Continue current management plan.  Muscle Spasms Occasional spasms managed with baclofen. -Continue current management strategy.  General Health Maintenance -Encourage patient to schedule mammogram. -Administer shingles vaccine today and schedule second dose for next visit. -Administer flu shot today. -Schedule follow-up visit in 3  months.

## 2022-12-19 ENCOUNTER — Other Ambulatory Visit: Payer: Self-pay

## 2022-12-19 ENCOUNTER — Ambulatory Visit (INDEPENDENT_AMBULATORY_CARE_PROVIDER_SITE_OTHER): Payer: Medicaid Other | Admitting: Family Medicine

## 2022-12-19 ENCOUNTER — Encounter: Payer: Self-pay | Admitting: Family Medicine

## 2022-12-19 ENCOUNTER — Encounter: Payer: Self-pay | Admitting: Oncology

## 2022-12-19 VITALS — BP 132/84 | HR 88 | Temp 98.4°F | Resp 18 | Ht 67.0 in | Wt 384.0 lb

## 2022-12-19 DIAGNOSIS — E0590 Thyrotoxicosis, unspecified without thyrotoxic crisis or storm: Secondary | ICD-10-CM | POA: Diagnosis not present

## 2022-12-19 DIAGNOSIS — Z23 Encounter for immunization: Secondary | ICD-10-CM | POA: Diagnosis not present

## 2022-12-19 DIAGNOSIS — I10 Essential (primary) hypertension: Secondary | ICD-10-CM | POA: Diagnosis not present

## 2022-12-19 DIAGNOSIS — F321 Major depressive disorder, single episode, moderate: Secondary | ICD-10-CM | POA: Diagnosis not present

## 2022-12-19 DIAGNOSIS — Z1231 Encounter for screening mammogram for malignant neoplasm of breast: Secondary | ICD-10-CM | POA: Diagnosis not present

## 2022-12-19 DIAGNOSIS — E039 Hypothyroidism, unspecified: Secondary | ICD-10-CM

## 2022-12-19 DIAGNOSIS — I422 Other hypertrophic cardiomyopathy: Secondary | ICD-10-CM

## 2022-12-19 MED ORDER — ZEPBOUND 2.5 MG/0.5ML ~~LOC~~ SOAJ
2.5000 mg | SUBCUTANEOUS | 0 refills | Status: DC
  Filled 2022-12-19: qty 2, 28d supply, fill #0

## 2022-12-19 MED ORDER — ZEPBOUND 2.5 MG/0.5ML ~~LOC~~ SOAJ
2.5000 mg | SUBCUTANEOUS | 0 refills | Status: DC
Start: 2022-12-19 — End: 2022-12-19

## 2022-12-20 ENCOUNTER — Other Ambulatory Visit (HOSPITAL_COMMUNITY): Payer: Self-pay

## 2022-12-20 ENCOUNTER — Other Ambulatory Visit: Payer: Self-pay

## 2022-12-20 ENCOUNTER — Other Ambulatory Visit: Payer: Self-pay | Admitting: Family Medicine

## 2022-12-20 LAB — TSH: TSH: 3.54 m[IU]/L

## 2022-12-20 MED ORDER — SEMAGLUTIDE-WEIGHT MANAGEMENT 0.5 MG/0.5ML ~~LOC~~ SOAJ: 0.5000 mg | SUBCUTANEOUS | 0 refills | Status: AC

## 2022-12-20 MED ORDER — SEMAGLUTIDE-WEIGHT MANAGEMENT 0.25 MG/0.5ML ~~LOC~~ SOAJ: 0.2500 mg | SUBCUTANEOUS | 0 refills | Status: AC

## 2022-12-20 MED ORDER — SEMAGLUTIDE-WEIGHT MANAGEMENT 1 MG/0.5ML ~~LOC~~ SOAJ: 1.0000 mg | SUBCUTANEOUS | 0 refills | Status: DC

## 2022-12-30 ENCOUNTER — Other Ambulatory Visit: Payer: Self-pay | Admitting: Family Medicine

## 2022-12-30 DIAGNOSIS — E039 Hypothyroidism, unspecified: Secondary | ICD-10-CM

## 2023-01-19 ENCOUNTER — Telehealth: Payer: Medicaid Other | Admitting: Nurse Practitioner

## 2023-01-19 DIAGNOSIS — L732 Hidradenitis suppurativa: Secondary | ICD-10-CM | POA: Diagnosis not present

## 2023-01-19 MED ORDER — DOXYCYCLINE HYCLATE 100 MG PO TABS: 100.0000 mg | ORAL_TABLET | Freq: Two times a day (BID) | ORAL | 0 refills | Status: AC

## 2023-01-19 NOTE — Progress Notes (Signed)
E-Visit for hidradenitis  We are sorry that you are not feeling well. Here is how we plan to help!  Based on what you shared with me it looks like you have cellulitis.  Cellulitis looks like areas of skin redness, swelling, and warmth; it develops as a result of bacteria entering under the skin. Little red spots and/or bleeding can be seen in skin, and tiny surface sacs containing fluid can occur. Fever can be present. Cellulitis is almost always on one side of a body, and the lower limbs are the most common site of involvement.   I have prescribed: doxycycline 100 mg BID. I recommend you request a dermatology referral from your PCP is you have re occurring episodes like this  HOME CARE:  Take your medications as ordered and take all of them, even if the skin irritation appears to be healing.   GET HELP RIGHT AWAY IF:  Symptoms that don't begin to go away within 48 hours. Severe redness persists or worsens If the area turns color, spreads or swells. If it blisters and opens, develops yellow-brown crust or bleeds. You develop a fever or chills. If the pain increases or becomes unbearable.  Are unable to keep fluids and food down.  MAKE SURE YOU   Understand these instructions. Will watch your condition. Will get help right away if you are not doing well or get worse.  Thank you for choosing an e-visit.  Your e-visit answers were reviewed by a board certified advanced clinical practitioner to complete your personal care plan. Depending upon the condition, your plan could have included both over the counter or prescription medications.  Please review your pharmacy choice. Make sure the pharmacy is open so you can pick up prescription now. If there is a problem, you may contact your provider through [employer] and have the prescription routed to another pharmacy.  Your safety is important to [place]. If you have drug allergies check your prescription carefully.   For the next 24 hours  you can use MyChart to ask questions about today's visit, request a non-urgent call back, or ask for a work or school excuse. You will get an email in the next two days asking about your experience. I hope that your e-visit has been valuable and will speed your recovery.

## 2023-01-19 NOTE — Progress Notes (Signed)
I have spent 5 minutes in review of e-visit questionnaire, review and updating patient chart, medical decision making and response to patient.  ° °Jerrell Mangel W Secilia Apps, NP ° °  °

## 2023-01-23 ENCOUNTER — Ambulatory Visit: Payer: Medicaid Other

## 2023-01-25 ENCOUNTER — Other Ambulatory Visit: Payer: Self-pay | Admitting: Family Medicine

## 2023-02-01 ENCOUNTER — Ambulatory Visit: Payer: Medicaid Other | Admitting: Family Medicine

## 2023-02-02 ENCOUNTER — Other Ambulatory Visit: Payer: Self-pay | Admitting: Family Medicine

## 2023-02-04 ENCOUNTER — Other Ambulatory Visit: Payer: Self-pay | Admitting: Family Medicine

## 2023-02-04 MED ORDER — SEMAGLUTIDE-WEIGHT MANAGEMENT 1 MG/0.5ML ~~LOC~~ SOAJ: 1.0000 mg | SUBCUTANEOUS | 0 refills | Status: AC

## 2023-03-18 ENCOUNTER — Other Ambulatory Visit: Payer: Self-pay | Admitting: Family Medicine

## 2023-03-18 DIAGNOSIS — I10 Essential (primary) hypertension: Secondary | ICD-10-CM

## 2023-03-28 ENCOUNTER — Other Ambulatory Visit: Payer: Self-pay | Admitting: Family Medicine

## 2023-03-28 DIAGNOSIS — I10 Essential (primary) hypertension: Secondary | ICD-10-CM

## 2023-03-30 ENCOUNTER — Other Ambulatory Visit: Payer: Self-pay | Admitting: Family Medicine

## 2023-03-30 DIAGNOSIS — I10 Essential (primary) hypertension: Secondary | ICD-10-CM

## 2023-04-02 ENCOUNTER — Encounter: Payer: Self-pay | Admitting: Family Medicine

## 2023-04-02 DIAGNOSIS — I10 Essential (primary) hypertension: Secondary | ICD-10-CM

## 2023-04-02 MED ORDER — AMLODIPINE BESYLATE-VALSARTAN 5-160 MG PO TABS: 1.0000 | ORAL_TABLET | Freq: Every day | ORAL | 0 refills | Status: DC

## 2023-04-17 ENCOUNTER — Encounter: Payer: Self-pay | Admitting: Family Medicine

## 2023-04-24 ENCOUNTER — Ambulatory Visit: Admitting: Family Medicine

## 2023-04-24 ENCOUNTER — Encounter: Payer: Self-pay | Admitting: Family Medicine

## 2023-04-24 VITALS — BP 132/84 | HR 94 | Resp 16 | Ht 67.0 in | Wt 376.4 lb

## 2023-04-24 DIAGNOSIS — I10 Essential (primary) hypertension: Secondary | ICD-10-CM

## 2023-04-24 DIAGNOSIS — E890 Postprocedural hypothyroidism: Secondary | ICD-10-CM | POA: Diagnosis not present

## 2023-04-24 DIAGNOSIS — I422 Other hypertrophic cardiomyopathy: Secondary | ICD-10-CM

## 2023-04-24 DIAGNOSIS — E0590 Thyrotoxicosis, unspecified without thyrotoxic crisis or storm: Secondary | ICD-10-CM

## 2023-04-24 DIAGNOSIS — F3340 Major depressive disorder, recurrent, in remission, unspecified: Secondary | ICD-10-CM

## 2023-04-24 DIAGNOSIS — E7800 Pure hypercholesterolemia, unspecified: Secondary | ICD-10-CM

## 2023-04-24 MED ORDER — SEMAGLUTIDE-WEIGHT MANAGEMENT 2.4 MG/0.75ML ~~LOC~~ SOAJ: 2.4000 mg | SUBCUTANEOUS | 0 refills | Status: AC

## 2023-04-24 MED ORDER — SEMAGLUTIDE-WEIGHT MANAGEMENT 1 MG/0.5ML ~~LOC~~ SOAJ: 1.0000 mg | SUBCUTANEOUS | 0 refills | Status: AC

## 2023-04-24 MED ORDER — AMLODIPINE BESYLATE-VALSARTAN 5-160 MG PO TABS: 1.0000 | ORAL_TABLET | Freq: Every day | ORAL | 0 refills | Status: DC

## 2023-04-24 MED ORDER — SEMAGLUTIDE-WEIGHT MANAGEMENT 1.7 MG/0.75ML ~~LOC~~ SOAJ: 1.7000 mg | SUBCUTANEOUS | 0 refills | Status: AC

## 2023-04-24 MED ORDER — SERTRALINE HCL 100 MG PO TABS: 100.0000 mg | ORAL_TABLET | Freq: Every day | ORAL | 1 refills | Status: DC

## 2023-04-24 NOTE — Progress Notes (Signed)
 Name: Madeline Dominguez   MRN: [id number]    DOB: Jun 30, 1965   Date:04/24/2023       Progress Note  Subjective  Chief Complaint  Chief Complaint  Patient presents with   Medical Management of Chronic Issues   Discussed the use of AI scribe software for clinical note transcription with the patient, who gave verbal consent to proceed.  History of Present Illness Madeline Dominguez is a 58 year old female with obesity and hypothyroidism who presents for a follow-up visit.  She started Wegovy  for obesity management four months ago, initially at a lower dose, now at 1 mg. Her weight decreased from 384 lbs to 376.4 lbs, despite a two-week gap in medication. She reports no significant side effects, except increased appetite during the gap.  She has post-ablative hypothyroidism and takes 75 mcg of thyroid  medication daily. Her TSH levels improved from 6.59 to 3.54 since December. She experiences hair thinning and dry skin, particularly on her arms and around her ankles.  She has dilated cardiomyopathy with an ejection fraction of 45-50% and grade one diastolic dysfunction. She last consulted her cardiologist in November 2024. No orthopnea, peripheral edema, or wheezing. She takes amlodipine  and valsartan  for hypertension and bp has been well controlled  She has sleep apnea but is not consistently using her CPAP machine. She also has a history of low vitamin D  levels and feels she would have more energy if she took supplements regularly.  She has major depression, managed with 100 mg of sertraline  daily, and feels 'pretty good' on this regimen. She previously took a higher dose but has since reduced it.  She has a history of iron  deficiency anemia but is not currently receiving iron  infusions. Her last labs showed no anemia.  She works from home and is reminded to schedule a mammogram, which is overdue.    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) 07/10/2016   History of radioactive iodine thyroid  ablation    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   Iron  deficiency anemia 07/27/2015   B12 deficiency 07/27/2015   History of pulmonary embolus (PE) 07/25/2015   Pulmonary nodules/lesions, multiple 07/25/2015   OSA (obstructive sleep apnea) 05/18/2015   Gastroesophageal reflux disease without esophagitis 09/02/2014   Morbid obesity (HCC) 06/22/2014   Large breasts 06/22/2014   Hypertension, benign 06/22/2014   H/O hyperthyroidism 06/22/2014   Right ventricular dilation 06/22/2014   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    Past Surgical History:  Procedure Laterality Date   COLONOSCOPY WITH PROPOFOL  N/A 10/25/2020   Procedure: COLONOSCOPY WITH PROPOFOL ;  Surgeon: Luke Salaam, MD;  Location: [facility] ENDOSCOPY;  Service: Gastroenterology;  Laterality: N/A;   LAPAROTOMY     for after childbirth    Family History  Problem Relation Age of Onset   Hypertension Mother    CVA Father    Deep vein thrombosis Brother    Pulmonary embolism Brother    Anemia Sister     Social History  Tobacco Use   Smoking status: Never   Smokeless tobacco: Never  Substance Use Topics   Alcohol use: No    Alcohol/week: 0.0 standard drinks of alcohol    Comment: rarely     Current Outpatient Medications:    amLODipine -valsartan  (EXFORGE ) 5-160 MG tablet, Take 1 tablet by mouth daily., Disp: 30 tablet, Rfl: 0    baclofen  (LIORESAL ) 10 MG tablet, Take 1 tablet (10 mg total) by mouth 3 (three) times daily as needed for muscle spasms., Disp: 30 tablet, Rfl: 1   Cholecalciferol (VITAMIN D ) 50 MCG (2000 UT) CAPS, Take 1 capsule (2,000 Units total) by mouth daily., Disp: 30 capsule, Rfl: 0   levothyroxine  (SYNTHROID ) 75 MCG tablet, TAKE 1 TABLET BY MOUTH ONCE DAILY BEFORE BREAKFAST, Disp: 90 tablet, Rfl: 0   meloxicam  (MOBIC ) 15 MG tablet, Take 1 tablet (15 mg total) by mouth daily as needed for pain. Moderate pain, try tylenol  500 mg twice daily other days, Disp: 90 tablet, Rfl: 0   sertraline  (ZOLOFT ) 100 MG tablet, Take 1 tablet (100 mg total) by mouth daily., Disp: 90 tablet, Rfl: 1   vitamin B-12 1000 MCG tablet, Take 1 tablet (1,000 mcg total) by mouth daily., Disp: 30 tablet, Rfl: 0  Allergies  Allergen Reactions   Aspirin  Nausea And Vomiting   Hctz [Hydrochlorothiazide ] Other (See Comments)    Nausea, cramping   Tomato Itching and Swelling    I personally reviewed active problem list, medication list, allergies, family history with the patient/caregiver today.   ROS  Ten systems reviewed and is negative except as mentioned in HPI    Objective Physical Exam Constitutional: Patient appears well-developed and well-nourished. Obese  No distress.  HEENT: head atraumatic, normocephalic, pupils equal and reactive to light, neck supple Cardiovascular: Normal rate, regular rhythm and normal heart sounds.  No murmur heard. No BLE edema. Pulmonary/Chest: Effort normal and breath sounds normal. No respiratory distress. Abdominal: Soft.  There is no tenderness. Skin is dry on elbows Psychiatric: Patient has a normal mood and affect. behavior is normal. Judgment and thought content normal.    Vitals:   04/24/23 0819  BP: 132/84  Pulse: 94  Resp: 16  SpO2: 99%  Weight: (!) 376 lb 6.4 oz (170.7 kg)  Height: 5\' 7"  (1.702 m)    Body mass index is 58.95 kg/m.   PHQ2/9:    04/24/2023    8:12  AM 12/19/2022    2:13 PM 09/18/2022    9:42 AM 04/20/2022   11:55 AM 02/19/2022    1:49 PM  Depression screen PHQ 2/9  Decreased Interest 0 0 2 2 1   Down, Depressed, Hopeless 0 0 1 1 1   PHQ - 2 Score 0 0 3 3 2   Altered sleeping 0 1 3 3 1   Tired, decreased energy 0 1 1 3  0  Change in appetite 0 1 1 3  0  Feeling bad or failure about yourself  0 0 1 0 0  Trouble concentrating 0 1 1 1 2   Moving slowly or fidgety/restless 0 0 0 0 0  Suicidal thoughts 0 0 0 0 0  PHQ-9 Score 0 4 10 13 5   Difficult doing work/chores Not difficult at all Somewhat difficult Not difficult at all  Somewhat difficult    phq 9 is negative  Fall Risk:    09/18/2022    9:42 AM 04/20/2022   11:55 AM 02/19/2022    1:49 PM 11/22/2021    8:35 AM 09/05/2021  9:15 AM  Fall Risk   Falls in the past year? 0 0 0 0 0  Number falls in past yr:  0 0 0 0  Injury with Fall?  0 0 0 0  Risk for fall due to : No Fall Risks No Fall Risks No Fall Risks No Fall Risks No Fall Risks  Follow up Falls prevention discussed Falls prevention discussed Falls prevention discussed;Education provided;Falls evaluation completed Falls prevention discussed Falls prevention discussed     Assessment and Plan Assessment & Plan Obesity BMI 60.14. Weight decreased from 384 lbs to 376.4 lbs since starting Wegovy . No significant side effects. Goal: 5% weight loss. - Prescribe Wegovy  1 mg for one month, then increase to 1.7 mg, and finally to 2.4 mg. - Monitor for nausea upon resuming 1 mg dose after gap. - Schedule follow-up in three months, ideally with daughter's appointment.  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 Grade 1 diastolic dysfunction, left ventricular hypertrophy. No significant symptoms. Blood pressure management crucial. - Continue amlodipine  and valsartan  for blood pressure control. - Monitor cardiac status and follow up with cardiologist as needed.  Sleep apnea Not  consistently using CPAP machine.  Hypercholesterolemia Negative CTA done Dec 2024 Not on statin therapy   Postablative hypothyroidism TSH improved from 6.59 to 3.54. On levothyroxine  75 mcg daily. Reports xerosis and hair thinning. - Continue levothyroxine  75 mcg daily. - Monitor thyroid  function tests as needed.  Major depressive disorder, recurrent, in remission In remission on sertraline  100 mg daily. Reports feeling well. - Continue sertraline  100 mg daily.  General Health Maintenance Mammogram overdue. Vitamin D  deficiency noted. - Schedule and complete mammogram. - Encourage daily vitamin D  supplementation.

## 2023-05-03 ENCOUNTER — Other Ambulatory Visit: Payer: Self-pay | Admitting: Family Medicine

## 2023-05-03 DIAGNOSIS — E039 Hypothyroidism, unspecified: Secondary | ICD-10-CM

## 2023-06-27 ENCOUNTER — Other Ambulatory Visit: Payer: Self-pay | Admitting: Family Medicine

## 2023-06-28 ENCOUNTER — Other Ambulatory Visit (HOSPITAL_COMMUNITY): Payer: Self-pay

## 2023-06-28 ENCOUNTER — Telehealth: Payer: Self-pay | Admitting: Pharmacy Technician

## 2023-06-28 NOTE — Telephone Encounter (Signed)
 Insurance companies are becoming increasingly stricter about requiring thorough documentation of lifestyle modifications in the patient's chart at each visit. This includes detailed records of diet recommendations (caloric intake, etc), exercise plans (amount of time/wk, etc), and an emphasis on the patient's commitment to continuing these efforts while on medication.  Without this additional documentation in the chart notes, a prior authorization will most likely be denied.   Most importantly we need a weight check within the last 45 days.  Thanks!

## 2023-06-28 NOTE — Telephone Encounter (Signed)
 Pt has an appt on 07/18/23 if she does not need the 2.4 dose yet we can wait till appointment time

## 2023-07-03 ENCOUNTER — Other Ambulatory Visit (HOSPITAL_COMMUNITY): Payer: Self-pay

## 2023-07-11 ENCOUNTER — Encounter: Payer: Self-pay | Admitting: Family Medicine

## 2023-07-15 ENCOUNTER — Encounter: Payer: Self-pay | Admitting: Oncology

## 2023-07-15 ENCOUNTER — Other Ambulatory Visit (HOSPITAL_COMMUNITY): Payer: Self-pay

## 2023-07-18 ENCOUNTER — Encounter: Payer: Self-pay | Admitting: Family Medicine

## 2023-07-18 ENCOUNTER — Ambulatory Visit: Admitting: Family Medicine

## 2023-07-18 ENCOUNTER — Telehealth: Payer: Self-pay | Admitting: Pharmacy Technician

## 2023-07-18 ENCOUNTER — Other Ambulatory Visit (HOSPITAL_COMMUNITY): Payer: Self-pay

## 2023-07-18 DIAGNOSIS — I10 Essential (primary) hypertension: Secondary | ICD-10-CM | POA: Diagnosis not present

## 2023-07-18 DIAGNOSIS — E538 Deficiency of other specified B group vitamins: Secondary | ICD-10-CM

## 2023-07-18 DIAGNOSIS — F3341 Major depressive disorder, recurrent, in partial remission: Secondary | ICD-10-CM | POA: Diagnosis not present

## 2023-07-18 DIAGNOSIS — M170 Bilateral primary osteoarthritis of knee: Secondary | ICD-10-CM

## 2023-07-18 DIAGNOSIS — E0590 Thyrotoxicosis, unspecified without thyrotoxic crisis or storm: Secondary | ICD-10-CM | POA: Diagnosis not present

## 2023-07-18 DIAGNOSIS — E559 Vitamin D deficiency, unspecified: Secondary | ICD-10-CM

## 2023-07-18 DIAGNOSIS — I422 Other hypertrophic cardiomyopathy: Secondary | ICD-10-CM | POA: Diagnosis not present

## 2023-07-18 DIAGNOSIS — E890 Postprocedural hypothyroidism: Secondary | ICD-10-CM

## 2023-07-18 MED ORDER — LEVOTHYROXINE SODIUM 75 MCG PO TABS
75.0000 ug | ORAL_TABLET | Freq: Every day | ORAL | 0 refills | Status: DC
Start: 2023-07-18 — End: 2023-09-09

## 2023-07-18 NOTE — Progress Notes (Signed)
 Name: Madeline Dominguez   MRN: [id number]    DOB: 1965-09-28   Date:07/18/2023       Progress Note  Subjective  Chief Complaint  Chief Complaint  Patient presents with   Medical Management of Chronic Issues   Discussed the use of AI scribe software for clinical note transcription with the patient, who gave verbal consent to proceed.  History of Present Illness Madeline Dominguez is a 58 year old female who presents for follow-up on weight management and medication adherence.  She has been managing morbid obesity and has been on Wegovy  for weight management since late December. Initially, she  got up to 1 mg dose but has been without the medication for almost three weeks due to prescription approval issues. Despite this, she has experienced weight loss, with her weight decreasing from 384 lbs in December to 371.3 lbs currently. The medication helps curb her appetite, and she has been eating smaller portions and using an under-desk elliptical for exercise.  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 Her blood pressure is controlled with amlodipine  and valsartan . No palpitations or orthopnea. An echocardiogram in December showed mild left ventricular hypertrophy and grade one diastolic dysfunction.  She has hypothyroidism post-ablative treatment for hyperthyroidism. Her last TSH level in December was 3.54, and she is due for blood work during her next visit   She also has recurrent major depression, previously experiencing a severe episode that left her catatonic. She is currently on Zoloft  and reports a depression score of six, with some situational stress related to her mother's declining memory and independence. She does not wish to adjust her medication at this time.  She has osteoarthritis in both knees and reports occasional pain behind her left knee, possibly due to a pulled muscle from using the elliptical. She takes meloxicam  as  needed and notes that her knee pain has improved with weight loss and increased activity. She also uses baclofen  occasionally for back pain.  She is managing a vitamin D  deficiency and takes B12 supplements for a mild deficiency. Her B12 level was 466, and she plans to continue supplementation.    Patient Active Problem List   Diagnosis Date Noted   Hypothyroidism, postablative 04/24/2023   Hyperlipidemia 11/12/2022   Primary osteoarthritis of both knees 09/18/2022   Vitamin D  deficiency 09/18/2022   Hypertensive heart disease with heart failure (HCC) 04/20/2022   Stress incontinence in female 03/25/2017   History of deep venous thrombosis (DVT) of distal vein of right lower extremity 01/18/2017   Adrenal adenoma, left 09/11/2016   Current moderate episode of major depressive disorder without prior episode (HCC) 08/10/2016   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) 07/10/2016   History of radioactive iodine thyroid  ablation    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   Iron  deficiency anemia 07/27/2015   B12 deficiency 07/27/2015   History of pulmonary embolus (PE) 07/25/2015   Pulmonary nodules/lesions, multiple 07/25/2015   OSA (obstructive sleep apnea) 05/18/2015   Gastroesophageal reflux disease without esophagitis 09/02/2014   Morbid obesity (HCC) 06/22/2014   Large breasts 06/22/2014   Hypertension, benign 06/22/2014   H/O hyperthyroidism 06/22/2014   Right ventricular dilation 06/22/2014   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    Past Surgical History:  Procedure Laterality Date   COLONOSCOPY WITH PROPOFOL  N/A 10/25/2020   Procedure: COLONOSCOPY WITH PROPOFOL ;  Surgeon: Therisa Bi, MD;  Location: [facility] ENDOSCOPY;  Service: Gastroenterology;  Laterality: N/A;  LAPAROTOMY     for after childbirth    Family History  Problem Relation Age of Onset   Hypertension Mother    CVA Father    Deep vein thrombosis Brother    Pulmonary  embolism Brother    Anemia Sister     Social History   Tobacco Use   Smoking status: Never   Smokeless tobacco: Never  Substance Use Topics   Alcohol use: No    Alcohol/week: 0.0 standard drinks of alcohol    Comment: rarely     Current Outpatient Medications:    amLODipine -valsartan  (EXFORGE ) 5-160 MG tablet, Take 1 tablet by mouth daily., Disp: 90 tablet, Rfl: 0   baclofen  (LIORESAL ) 10 MG tablet, Take 1 tablet (10 mg total) by mouth 3 (three) times daily as needed for muscle spasms., Disp: 30 tablet, Rfl: 1   Cholecalciferol (VITAMIN D ) 50 MCG (2000 UT) CAPS, Take 1 capsule (2,000 Units total) by mouth daily., Disp: 30 capsule, Rfl: 0   levothyroxine  (SYNTHROID ) 75 MCG tablet, TAKE 1 TABLET BY MOUTH ONCE DAILY BEFORE BREAKFAST, Disp: 90 tablet, Rfl: 0   meloxicam  (MOBIC ) 15 MG tablet, Take 1 tablet (15 mg total) by mouth daily as needed for pain. Moderate pain, try tylenol  500 mg twice daily other days, Disp: 90 tablet, Rfl: 0   [START ON 07/20/2023] Semaglutide -Weight Management 1.7 MG/0.75ML SOAJ, Inject 1.7 mg into the skin once a week for 28 days., Disp: 3 mL, Rfl: 0   sertraline  (ZOLOFT ) 100 MG tablet, Take 1 tablet (100 mg total) by mouth daily., Disp: 90 tablet, Rfl: 1   vitamin B-12 1000 MCG tablet, Take 1 tablet (1,000 mcg total) by mouth daily., Disp: 30 tablet, Rfl: 0   Semaglutide -Weight Management 1 MG/0.5ML SOAJ, Inject 1 mg into the skin once a week for 28 days. (Patient not taking: Reported on 07/18/2023), Disp: 2 mL, Rfl: 0   [START ON 08/18/2023] Semaglutide -Weight Management 2.4 MG/0.75ML SOAJ, Inject 2.4 mg into the skin once a week for 28 days. (Patient not taking: Reported on 07/18/2023), Disp: 3 mL, Rfl: 0  Allergies  Allergen Reactions   Aspirin  Nausea And Vomiting   Hctz [Hydrochlorothiazide ] Other (See Comments)    Nausea, cramping   Tomato Itching and Swelling    I personally reviewed active problem list, medication list, allergies with the  patient/caregiver today.   ROS  Ten systems reviewed and is negative except as mentioned in HPI    Objective Physical Exam  CONSTITUTIONAL: Patient appears well-developed and  morbidly obese.  No distress. HEENT: Head atraumatic, normocephalic, neck supple. CARDIOVASCULAR: Normal rate, regular rhythm and normal heart sounds.  No murmur heard. No BLE edema. PULMONARY: Effort normal and breath sounds normal. No respiratory distress. ABDOMINAL: There is no tenderness or distention. MUSCULOSKELETAL: Normal gait. Without gross motor or sensory deficit. PSYCHIATRIC: Patient has a normal mood and affect. behavior is normal. Judgment and thought content normal.  Vitals:   07/18/23 0936  BP: 128/84  Pulse: 85  Resp: 16  SpO2: 93%  Weight: (!) 371 lb 4.8 oz (168.4 kg)  Height: 5' 7 (1.702 m)    Body mass index is 58.15 kg/m.    PHQ2/9:    07/18/2023    9:31 AM 04/24/2023    8:12 AM 12/19/2022    2:13 PM 09/18/2022    9:42 AM 04/20/2022   11:55 AM  Depression screen PHQ 2/9  Decreased Interest 1 0 0 2 2  Down, Depressed, Hopeless 1 0 0 1 1  PHQ - 2 Score 2 0 0 3 3  Altered sleeping 1 0 1 3 3   Tired, decreased energy 1 0 1 1 3   Change in appetite 1 0 1 1 3   Feeling bad or failure about yourself  0 0 0 1 0  Trouble concentrating 1 0 1 1 1   Moving slowly or fidgety/restless 0 0 0 0 0  Suicidal thoughts 0 0 0 0 0  PHQ-9 Score 6 0 4 10 13   Difficult doing work/chores Somewhat difficult Not difficult at all Somewhat difficult Not difficult at all     phq 9 is positive  Fall Risk:    07/18/2023    9:31 AM 09/18/2022    9:42 AM 04/20/2022   11:55 AM 02/19/2022    1:49 PM 11/22/2021    8:35 AM  Fall Risk   Falls in the past year? 0 0 0 0 0  Number falls in past yr: 0  0 0 0  Injury with Fall? 0  0 0 0  Risk for fall due to : No Fall Risks No Fall Risks No Fall Risks No Fall Risks No Fall Risks  Follow up Falls evaluation completed Falls prevention discussed Falls prevention  discussed Falls prevention discussed;Education provided;Falls evaluation completed Falls prevention discussed      Data saved with a previous flowsheet row definition      Assessment & Plan Morbid obesity BMI 58.15. Weight decreased from 384 lbs to 371.3 lbs. Previously on Wegovy , gap due to insurance denying refill of medication about 2 weeks ago. Decreased appetite, increased activity with under-desk elliptical. - Provide Ozempic  samples until Wegovy  approved. - Start Ozempic  0.5 mg for a week, increase to 1 mg if tolerated, then to 1.7 mg ( rx at pharmacy) . - Complete prior authorization for Wegovy . - Follow-up in 3 months to monitor weight and medication efficacy.  Hypertrophic cardiomyopathy Secondary to previous hyperthyroidism. Echocardiogram showed mild left ventricular hypertrophy, grade 1 diastolic dysfunction, ejection fraction 45-50%. Asymptomatic, no CHF signs. - Monitor cardiac symptoms, maintain BP control with amlodipine /valsartan .  Hypertension Well-controlled with amlodipine /valsartan  5/160 mg. BP 128/84 mmHg. - Continue amlodipine /valsartan  5/160 mg daily. - Monitor BP regularly.  Hypothyroidism post-ablation Post-radioactive iodine ablation. Last TSH 3.54, stable thyroid  function. - Continue current thyroid  medication. - Recheck TSH and thyroid  function tests in October.  Major depressive disorder, recurrent, in partial remission PHQ-9 score 6, mild depression. Stress from mother's declining memory. Zoloft  helpful. - Continue Zoloft  at current dose.  Osteoarthritis of both knees Intermittent pain, improved with weight loss and activity. Occasional meloxicam  use. - Continue meloxicam  as needed. - Encourage weight loss and physical activity.  Left popliteal fossa pain (possible popliteal cyst) Pain when stretching leg, possible popliteal cyst. No significant functional impairment. - Monitor symptoms, manage with activity modification.  Low back  pain Intermittent, managed with baclofen . - Continue baclofen  as needed.  Vitamin D  deficiency Managed with supplementation. - Continue vitamin D  supplementation.  Vitamin B12 deficiency Level 466, managed with sublingual B12. - Continue sublingual B12 supplementation.

## 2023-07-18 NOTE — Telephone Encounter (Signed)
 Pharmacy Patient Advocate Encounter   Received notification from Pt Calls Messages that prior authorization for Wegovy  1.7MG /0.75ML auto-injectors is required/requested.   Insurance verification completed.   The patient is insured through [facility] .   Per test claim: PA required; PA submitted to above mentioned insurance via CoverMyMeds Key/confirmation #/EOC [id number] Status is pending

## 2023-07-18 NOTE — Telephone Encounter (Signed)
 PA request has been Submitted. New Encounter has been or will be created for follow up. For additional info see Pharmacy Prior Auth telephone encounter from 07/18/23.

## 2023-07-18 NOTE — Telephone Encounter (Signed)
 Can we please proceed with 1.7mg  PA ?

## 2023-07-18 NOTE — Telephone Encounter (Signed)
 Pharmacy Patient Advocate Encounter  Received notification from [facility] that Prior Authorization for Wegovy  1.7MG /0.75ML auto-injectors has been APPROVED from 07/18/23 to 07/17/24. Ran test claim, Copay is $4.00. This test claim was processed through [facility]- copay amounts may vary at other pharmacies due to pharmacy/plan contracts, or as the patient moves through the different stages of their insurance plan.   PA #/Case ID/Reference #: [id number]

## 2023-07-28 ENCOUNTER — Other Ambulatory Visit: Payer: Self-pay | Admitting: Family Medicine

## 2023-07-28 DIAGNOSIS — I10 Essential (primary) hypertension: Secondary | ICD-10-CM

## 2023-09-06 ENCOUNTER — Other Ambulatory Visit: Payer: Self-pay | Admitting: Family Medicine

## 2023-09-06 DIAGNOSIS — E890 Postprocedural hypothyroidism: Secondary | ICD-10-CM

## 2023-09-09 ENCOUNTER — Encounter: Payer: Self-pay | Admitting: Family Medicine

## 2023-09-09 DIAGNOSIS — E890 Postprocedural hypothyroidism: Secondary | ICD-10-CM

## 2023-09-09 MED ORDER — LEVOTHYROXINE SODIUM 75 MCG PO TABS: 75.0000 ug | ORAL_TABLET | Freq: Every day | ORAL | 0 refills | Status: DC

## 2023-09-21 ENCOUNTER — Other Ambulatory Visit: Payer: Self-pay | Admitting: Family Medicine

## 2023-10-18 ENCOUNTER — Ambulatory Visit: Admitting: Family Medicine

## 2023-10-31 ENCOUNTER — Other Ambulatory Visit: Payer: Self-pay | Admitting: Family Medicine

## 2023-10-31 DIAGNOSIS — I10 Essential (primary) hypertension: Secondary | ICD-10-CM

## 2023-10-31 NOTE — Telephone Encounter (Signed)
Has appt 12/12

## 2023-11-04 ENCOUNTER — Other Ambulatory Visit: Payer: Self-pay | Admitting: Family Medicine

## 2023-11-04 DIAGNOSIS — I10 Essential (primary) hypertension: Secondary | ICD-10-CM

## 2023-11-04 NOTE — Telephone Encounter (Unsigned)
 Copied from CRM #[id number]. Topic: Clinical - Medication Refill >> Nov 04, 2023  2:09 PM Tiffini S wrote: Medication:  amLODipine -valsartan  (EXFORGE ) 5-160 MG tablet   Has the patient contacted their pharmacy? Yes (Agent: If no, request that the patient contact the pharmacy for the refill. If patient does not wish to contact the pharmacy document the reason why and proceed with request.) (Agent: If yes, when and what did the pharmacy advise?)  This is the patient's preferred pharmacy:  [facility] [address]), [place] - [address] [place]) [place] [address] Phone: [phone] Fax: [phone]   Is this the correct pharmacy for this prescription? Yes If no, delete pharmacy and type the correct one.   Has the prescription been filled recently? Yes  Is the patient out of the medication? Yes  Has the patient been seen for an appointment in the last year OR does the patient have an upcoming appointment? Yes  Can we respond through MyChart? Yes  Agent: Please be advised that Rx refills may take up to 3 business days. We ask that you follow-up with your pharmacy.

## 2023-11-05 NOTE — Telephone Encounter (Signed)
 Requested Prescriptions  Refused Prescriptions Disp Refills   amLODipine -valsartan  (EXFORGE ) 5-160 MG tablet 90 tablet 0    Sig: Take 1 tablet by mouth daily.     Cardiovascular: CCB + ARB Combos Failed - 11/05/2023  5:25 PM      Failed - K in normal range and within 180 days    Potassium  Date Value Ref Range Status  09/18/2022 4.2 3.5 - 5.3 mmol/L Final         Failed - Cr in normal range and within 180 days    Creat  Date Value Ref Range Status  09/18/2022 0.76 0.50 - 1.03 mg/dL Final         Failed - Na in normal range and within 180 days    Sodium  Date Value Ref Range Status  09/18/2022 143 135 - 146 mmol/L Final  12/03/2014 144 136 - 144 mmol/L Final    Comment:    **Effective December 13, 2014 the reference interval**   for Sodium, Serum will be changing to:                                             134 - 144          Passed - Patient is not pregnant      Passed - Last BP in normal range    BP Readings from Last 1 Encounters:  07/18/23 128/84         Passed - Valid encounter within last 6 months    Recent Outpatient Visits           3 months ago Morbid obesity [facility])   [facility] Glenard Mire, MD   6 months ago Hypertension, benign   [facility] Sowles, Krichna, MD

## 2023-12-13 ENCOUNTER — Encounter: Payer: Self-pay | Admitting: Family Medicine

## 2023-12-13 ENCOUNTER — Ambulatory Visit: Admitting: Family Medicine

## 2023-12-13 DIAGNOSIS — E0590 Thyrotoxicosis, unspecified without thyrotoxic crisis or storm: Secondary | ICD-10-CM

## 2023-12-13 DIAGNOSIS — I422 Other hypertrophic cardiomyopathy: Secondary | ICD-10-CM

## 2023-12-13 DIAGNOSIS — I10 Essential (primary) hypertension: Secondary | ICD-10-CM

## 2023-12-13 DIAGNOSIS — E890 Postprocedural hypothyroidism: Secondary | ICD-10-CM

## 2023-12-13 DIAGNOSIS — E7800 Pure hypercholesterolemia, unspecified: Secondary | ICD-10-CM

## 2023-12-13 DIAGNOSIS — E538 Deficiency of other specified B group vitamins: Secondary | ICD-10-CM | POA: Diagnosis not present

## 2023-12-13 DIAGNOSIS — Z1159 Encounter for screening for other viral diseases: Secondary | ICD-10-CM | POA: Diagnosis not present

## 2023-12-13 DIAGNOSIS — Z131 Encounter for screening for diabetes mellitus: Secondary | ICD-10-CM

## 2023-12-13 DIAGNOSIS — G4733 Obstructive sleep apnea (adult) (pediatric): Secondary | ICD-10-CM

## 2023-12-13 DIAGNOSIS — E559 Vitamin D deficiency, unspecified: Secondary | ICD-10-CM | POA: Diagnosis not present

## 2023-12-13 DIAGNOSIS — F3341 Major depressive disorder, recurrent, in partial remission: Secondary | ICD-10-CM

## 2023-12-13 DIAGNOSIS — Z1231 Encounter for screening mammogram for malignant neoplasm of breast: Secondary | ICD-10-CM

## 2023-12-13 MED ORDER — AMLODIPINE BESYLATE-VALSARTAN 5-160 MG PO TABS: 1.0000 | ORAL_TABLET | Freq: Every day | ORAL | 1 refills | Status: AC

## 2023-12-13 MED ORDER — SERTRALINE HCL 100 MG PO TABS: 100.0000 mg | ORAL_TABLET | Freq: Every day | ORAL | 1 refills | Status: AC

## 2023-12-13 NOTE — Progress Notes (Signed)
 Name: CAMDYNN MARANTO   MRN: [id number]    DOB: 1965-01-10   Date:12/13/2023       Progress Note  Subjective  Chief Complaint  Chief Complaint  Patient presents with   Medical Management of Chronic Issues   Discussed the use of AI scribe software for clinical note transcription with the patient, who gave verbal consent to proceed.  History of Present Illness Madeline Dominguez is a 58 year old female with obesity and hypertension who presents for a follow-up visit.  She has been managing her weight since her insurance stopped covering Wegovy , a medication she previously used for weight management. Her current weight is approximately 370 pounds, similar to her weight in July. She initially started Wegovy  over a year ago, which helped her reduce her weight from 384 pounds to 371 pounds. Despite the discontinuation, she has been able to maintain her weight by controlling portion sizes and avoiding sweets, which she states is not a significant issue for her.  She has a history of hypercholesterolemia but is not currently on cholesterol-lowering medications. She is managing hypertension with amlodipine  and valsartan , which she takes daily without side effects. She experiences shortness of breath with exertion, which she describes as stable and not worsening. No chest pain or palpitations.  She has post-ablative hypothyroidism and takes levothyroxine  75 micrograms daily, although she missed her dose this morning. No significant symptoms such as changes in bowel movements, swallowing difficulties, or unusual hair loss. However, she has noticed her eyes watering frequently, which she attributes to possible tear duct issues.  She has a history of hypertrophic cardiomyopathy secondary to hyperthyroidism. Her last echocardiogram in December 2024 showed mild left ventricular hypertrophy with an ejection fraction of 45-50%. Her cardiac calcium score was zero last year, indicating no significant coronary  artery calcification.  She has sleep apnea but does not use a CPAP machine, preferring a mouthpiece to manage her condition. She also has a history of low vitamin D  and B12 deficiency, for which she has supplements but admits to not taking them regularly.  She has a history of depression, previously severe enough to warrant disability, but reports significant improvement. Currently, she is experiencing stress due to a job transition but denies feeling depressed or hopeless.    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   Iron  deficiency anemia 07/27/2015   B12 deficiency 07/27/2015   History of pulmonary embolus (PE) 07/25/2015   Pulmonary nodules/lesions, multiple 07/25/2015   OSA (obstructive sleep apnea) 05/18/2015   Gastroesophageal reflux disease without esophagitis 09/02/2014   Morbid obesity (HCC) 06/22/2014   Large breasts 06/22/2014   Hypertension, benign 06/22/2014   H/O hyperthyroidism 06/22/2014   Right ventricular dilation 06/22/2014   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    Past Surgical History:  Procedure Laterality Date   COLONOSCOPY WITH PROPOFOL  N/A 10/25/2020   Procedure: COLONOSCOPY WITH PROPOFOL ;  Surgeon: Therisa Bi, MD;  Location: [facility] END[facility]COPY;  Service: Gastroenterology;  Laterality:  N/A;   LAPAROTOMY     for after childbirth    Family History  Problem Relation Age of  Onset   Hypertension Mother    CVA Father    Deep vein thrombosis Brother    Pulmonary embolism Brother    Anemia Sister     Social History   Tobacco Use   Smoking status: Never   Smokeless tobacco: Never  Substance Use Topics   Alcohol use: No    Alcohol/week: 0.0 standard drinks of alcohol    Comment: rarely    Current Medications[1]  Allergies[2]  I personally reviewed active problem list, medication list, allergies, family history with the patient/caregiver today.   ROS  Ten systems reviewed and is negative except as mentioned in HPI    Objective Physical Exam VITALS: BP- 126/80 MEASUREMENTS: Weight- 370.11. CONSTITUTIONAL: Patient appears well-developed and well-nourished. No distress. HEENT: Head atraumatic, normocephalic, neck supple. CARDIOVASCULAR: Normal rate, regular rhythm and normal heart sounds. No murmur heard. No BLE edema. PULMONARY: Effort normal and breath sounds normal. No respiratory distress. ABDOMINAL: There is no tenderness or distention. MUSCULOSKELETAL: Normal gait. Without gross motor or sensory deficit. PSYCHIATRIC: Patient has a normal mood and affect. Behavior is normal. Judgment and thought content normal.  Vitals:   12/13/23 1043  BP: 126/82  Pulse: 92  Resp: 16  SpO2: 93%  Weight: (!) 370 lb 11.2 oz (168.1 kg)  Height: 5' 7 (1.702 m)    Body mass index is 58.06 kg/m.   PHQ2/9:    12/13/2023   10:41 AM 07/18/2023    9:31 AM 04/24/2023    8:12 AM 12/19/2022    2:13 PM 09/18/2022    9:42 AM  Depression screen PHQ 2/9  Decreased Interest 0 1 0 0 2  Down, Depressed, Hopeless 0 1 0 0 1  PHQ - 2 Score 0 2 0 0 3  Altered sleeping 0 1 0 1 3  Tired, decreased energy 0 1 0 1 1  Change in appetite 0 1 0 1 1  Feeling bad or failure about yourself  0 0 0 0 1  Trouble concentrating 0 1 0 1 1  Moving slowly or fidgety/restless 0 0 0 0 0  Suicidal thoughts 0 0 0 0 0  PHQ-9 Score 0 6  0  4  10   Difficult doing work/chores Not  difficult at all Somewhat difficult Not difficult at all Somewhat difficult Not difficult at all     Data saved with a previous flowsheet row definition    phq 9 is negative  Fall Risk:    12/13/2023   10:41 AM 07/18/2023    9:31 AM 09/18/2022    9:42 AM 04/20/2022   11:55 AM 02/19/2022    1:49 PM  Fall Risk   Falls in the past year? 0 0 0 0 0  Number falls in past yr: 0 0  0 0  Injury with Fall? 0 0   0  0   Risk for fall due to : No Fall Risks No Fall Risks No Fall Risks No Fall Risks No Fall Risks  Follow up Falls evaluation completed Falls evaluation completed Falls prevention discussed Falls prevention discussed Falls prevention discussed;Education provided;Falls evaluation completed     Data saved with a previous flowsheet row definition      Assessment & Plan Morbid obesity, BMI over 50 Weight stable at 371 lbs post-Wegovy  discontinuation. Improved portion control, cravings only during menstrual cycle. - Continue portion control strategies. -  Encouraged increased physical activity.  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 Mild left ventricular hypertrophy, EF 45-50%. No coronary calcification, no heart failure symptoms. - Continue current management.  Postablative hypothyroidism On levothyroxine  75 mcg daily. Occasional watery eyes, possible tear duct issue. - Ordered thyroid  function tests. - Referred to ophthalmologist for evaluation of watery eyes.  Essential hypertension BP controlled at 126/80 mmHg on amlodipine  and valsartan . No side effects. - Continue current antihypertensive regimen. - Refilled prescriptions for amlodipine  and valsartan .  Hypercholesterolemia Not on cholesterol-lowering medication. Lipid status needs assessment. - Ordered lipid panel.  OSA - does not like CPAP, consider seeing dentist   Major depressive disorder, recurrent, in partial remission Mood improved, stress due to job transition. On sertraline . - Continue  sertraline  as prescribed.  Vitamin D  deficiency Not taking vitamin D  supplements. - Ordered vitamin D  level. - Encouraged vitamin D  supplementation.  Vitamin B12 deficiency Has B12 supplements, inconsistent intake. - Encouraged consistent B12 supplementation.  General Health Maintenance Discussed routine health maintenance, mammogram, hepatitis B vaccination. - Scheduled mammogram. - Ordered hepatitis B vaccination status.        [1]  Current Outpatient Medications:    amLODipine -valsartan  (EXFORGE ) 5-160 MG tablet, Take 1 tablet by mouth once daily, Disp: 90 tablet, Rfl: 0   baclofen  (LIORESAL ) 10 MG tablet, Take 1 tablet (10 mg total) by mouth 3 (three) times daily as needed for muscle spasms., Disp: 30 tablet, Rfl: 1   Cholecalciferol (VITAMIN D ) 50 MCG (2000 UT) CAPS, Take 1 capsule (2,000 Units total) by mouth daily., Disp: 30 capsule, Rfl: 0   levothyroxine  (SYNTHROID ) 75 MCG tablet, Take 1 tablet (75 mcg total) by mouth daily before breakfast., Disp: 30 tablet, Rfl: 0   meloxicam  (MOBIC ) 15 MG tablet, Take 1 tablet (15 mg total) by mouth daily as needed for pain. Moderate pain, try tylenol  500 mg twice daily other days, Disp: 90 tablet, Rfl: 0   sertraline  (ZOLOFT ) 100 MG tablet, Take 1 tablet (100 mg total) by mouth daily., Disp: 90 tablet, Rfl: 1   vitamin B-12 1000 MCG tablet, Take 1 tablet (1,000 mcg total) by mouth daily., Disp: 30 tablet, Rfl: 0 [2]  Allergies Allergen Reactions   Aspirin  Nausea And Vomiting   Hctz [Hydrochlorothiazide ] Other (See Comments)    Nausea, cramping   Tomato Itching and Swelling

## 2023-12-14 LAB — CBC WITH DIFFERENTIAL/PLATELET
Absolute Lymphocytes: 2214 {cells}/uL (ref 850–3900)
Absolute Monocytes: 437 {cells}/uL (ref 200–950)
Basophils Absolute: 22 {cells}/uL (ref 0–200)
Basophils Relative: 0.4 %
Eosinophils Absolute: 162 {cells}/uL (ref 15–500)
Eosinophils Relative: 3 %
HCT: 38.9 % (ref 35.9–46.0)
Hemoglobin: 12.3 g/dL (ref 11.7–15.5)
MCH: 29.7 pg (ref 27.0–33.0)
MCHC: 31.6 g/dL (ref 31.6–35.4)
MCV: 94 fL (ref 81.4–101.7)
MPV: 11.4 fL (ref 7.5–12.5)
Monocytes Relative: 8.1 %
Neutro Abs: 2565 {cells}/uL (ref 1500–7800)
Neutrophils Relative %: 47.5 %
Platelets: 249 Thousand/uL (ref 140–400)
RBC: 4.14 Million/uL (ref 3.80–5.10)
RDW: 12.1 % (ref 11.0–15.0)
Total Lymphocyte: 41 %
WBC: 5.4 Thousand/uL (ref 3.8–10.8)

## 2023-12-14 LAB — COMPREHENSIVE METABOLIC PANEL WITH GFR
AG Ratio: 1.1 (calc) (ref 1.0–2.5)
ALT: 7 U/L (ref 6–29)
AST: 10 U/L (ref 10–35)
Albumin: 3.7 g/dL (ref 3.6–5.1)
Alkaline phosphatase (APISO): 87 U/L (ref 37–153)
BUN: 12 mg/dL (ref 7–25)
CO2: 30 mmol/L (ref 20–32)
Calcium: 9 mg/dL (ref 8.6–10.4)
Chloride: 105 mmol/L (ref 98–110)
Creat: 0.74 mg/dL (ref 0.50–1.03)
Globulin: 3.4 g/dL (ref 1.9–3.7)
Glucose, Bld: 100 mg/dL — ABNORMAL HIGH (ref 65–99)
Potassium: 4 mmol/L (ref 3.5–5.3)
Sodium: 141 mmol/L (ref 135–146)
Total Bilirubin: 0.5 mg/dL (ref 0.2–1.2)
Total Protein: 7.1 g/dL (ref 6.1–8.1)
eGFR: 95 mL/min/1.73m2 (ref >=60)

## 2023-12-14 LAB — HEMOGLOBIN A1C
Hgb A1c MFr Bld: 5 % (ref <5.7)
Mean Plasma Glucose: 97 mg/dL
eAG (mmol/L): 5.4 mmol/L

## 2023-12-14 LAB — LIPID PANEL
Cholesterol: 198 mg/dL (ref <200)
HDL: 67 mg/dL (ref >=50)
LDL Cholesterol (Calc): 116 mg/dL — ABNORMAL HIGH
Non-HDL Cholesterol (Calc): 131 mg/dL — ABNORMAL HIGH (ref <130)
Total CHOL/HDL Ratio: 3 (calc) (ref <5.0)
Triglycerides: 60 mg/dL (ref <150)

## 2023-12-14 LAB — TSH: TSH: 4.37 m[IU]/L (ref 0.40–4.50)

## 2023-12-14 LAB — HEPATITIS B SURFACE ANTIBODY,QUALITATIVE: Hep B S Ab: NONREACTIVE

## 2023-12-14 LAB — B12 AND FOLATE PANEL
Folate: 5.9 ng/mL
Vitamin B-12: 324 pg/mL (ref 200–1100)

## 2023-12-14 LAB — VITAMIN D 25 HYDROXY (VIT D DEFICIENCY, FRACTURES): Vit D, 25-Hydroxy: 13 ng/mL — ABNORMAL LOW (ref 30–100)

## 2023-12-16 ENCOUNTER — Ambulatory Visit: Payer: Self-pay | Admitting: Family Medicine

## 2024-01-13 ENCOUNTER — Other Ambulatory Visit: Payer: Self-pay | Admitting: Family Medicine

## 2024-01-13 DIAGNOSIS — M62838 Other muscle spasm: Secondary | ICD-10-CM

## 2024-01-13 MED ORDER — BACLOFEN 10 MG PO TABS: 10.0000 mg | ORAL_TABLET | Freq: Three times a day (TID) | ORAL | 1 refills | Status: AC | PRN

## 2024-01-13 NOTE — Telephone Encounter (Signed)
baclofen (LIORESAL) 10 MG tablet

## 2024-01-23 ENCOUNTER — Other Ambulatory Visit: Payer: Self-pay

## 2024-01-23 DIAGNOSIS — E890 Postprocedural hypothyroidism: Secondary | ICD-10-CM

## 2024-01-23 MED ORDER — LEVOTHYROXINE SODIUM 75 MCG PO TABS: 75.0000 ug | ORAL_TABLET | Freq: Every day | ORAL | 0 refills | Status: AC

## 2024-06-12 ENCOUNTER — Ambulatory Visit: Admitting: Family Medicine
